# Patient Record
Sex: Female | Born: 1965
Health system: Southern US, Community
[De-identification: ages and names within clinical notes are randomized; demographics above are authoritative.]

## PROBLEM LIST (undated history)

## (undated) DIAGNOSIS — J45909 Unspecified asthma, uncomplicated: Secondary | ICD-10-CM

## (undated) DIAGNOSIS — I10 Essential (primary) hypertension: Secondary | ICD-10-CM

## (undated) DIAGNOSIS — Z90710 Acquired absence of both cervix and uterus: Secondary | ICD-10-CM

## (undated) DIAGNOSIS — Q2112 Patent foramen ovale: Secondary | ICD-10-CM

## (undated) DIAGNOSIS — Q211 Atrial septal defect: Secondary | ICD-10-CM

## (undated) DIAGNOSIS — R519 Headache, unspecified: Secondary | ICD-10-CM

## (undated) DIAGNOSIS — I639 Cerebral infarction, unspecified: Secondary | ICD-10-CM

## (undated) DIAGNOSIS — D649 Anemia, unspecified: Secondary | ICD-10-CM

## (undated) DIAGNOSIS — N200 Calculus of kidney: Secondary | ICD-10-CM

## (undated) DIAGNOSIS — R55 Syncope and collapse: Secondary | ICD-10-CM

## (undated) DIAGNOSIS — M719 Bursopathy, unspecified: Secondary | ICD-10-CM

## (undated) DIAGNOSIS — F329 Major depressive disorder, single episode, unspecified: Secondary | ICD-10-CM

## (undated) DIAGNOSIS — M75100 Unspecified rotator cuff tear or rupture of unspecified shoulder, not specified as traumatic: Secondary | ICD-10-CM

## (undated) DIAGNOSIS — K219 Gastro-esophageal reflux disease without esophagitis: Secondary | ICD-10-CM

## (undated) DIAGNOSIS — E119 Type 2 diabetes mellitus without complications: Secondary | ICD-10-CM

## (undated) DIAGNOSIS — F32A Depression, unspecified: Secondary | ICD-10-CM

## (undated) DIAGNOSIS — R51 Headache: Secondary | ICD-10-CM

## (undated) HISTORY — PX: BREAST REDUCTION SURGERY: SHX8

## (undated) HISTORY — PX: ABDOMINAL HYSTERECTOMY: SHX81

## (undated) HISTORY — PX: CARDIAC SURGERY: SHX584

## (undated) HISTORY — DX: Syncope and collapse: R55

## (undated) HISTORY — PX: HYSTEROTOMY: SHX1776

## (undated) HISTORY — PX: REDUCTION MAMMAPLASTY: SUR839

## (undated) HISTORY — PX: CARDIAC CATHETERIZATION: SHX172

---

## 1998-10-23 HISTORY — PX: REDUCTION MAMMAPLASTY: SUR839

## 2004-03-03 ENCOUNTER — Encounter: Admission: RE | Admit: 2004-03-03 | Discharge: 2004-03-03 | Payer: Self-pay | Admitting: Internal Medicine

## 2004-06-23 ENCOUNTER — Encounter: Admission: RE | Admit: 2004-06-23 | Discharge: 2004-06-23 | Payer: Self-pay | Admitting: Internal Medicine

## 2004-06-24 ENCOUNTER — Ambulatory Visit (HOSPITAL_COMMUNITY): Admission: RE | Admit: 2004-06-24 | Discharge: 2004-06-24 | Payer: Self-pay | Admitting: Gastroenterology

## 2004-06-30 ENCOUNTER — Encounter: Admission: RE | Admit: 2004-06-30 | Discharge: 2004-06-30 | Payer: Self-pay | Admitting: Internal Medicine

## 2004-07-04 ENCOUNTER — Ambulatory Visit (HOSPITAL_COMMUNITY): Admission: RE | Admit: 2004-07-04 | Discharge: 2004-07-04 | Payer: Self-pay | Admitting: Gastroenterology

## 2004-07-15 ENCOUNTER — Encounter: Admission: RE | Admit: 2004-07-15 | Discharge: 2004-07-15 | Payer: Self-pay | Admitting: Internal Medicine

## 2007-05-23 ENCOUNTER — Encounter: Admission: RE | Admit: 2007-05-23 | Discharge: 2007-05-23 | Payer: Self-pay | Admitting: Internal Medicine

## 2007-09-17 ENCOUNTER — Encounter: Admission: RE | Admit: 2007-09-17 | Discharge: 2007-09-17 | Payer: Self-pay | Admitting: Internal Medicine

## 2007-09-26 ENCOUNTER — Encounter: Admission: RE | Admit: 2007-09-26 | Discharge: 2007-09-26 | Payer: Self-pay | Admitting: Internal Medicine

## 2007-10-24 DIAGNOSIS — I639 Cerebral infarction, unspecified: Secondary | ICD-10-CM

## 2007-10-24 HISTORY — DX: Cerebral infarction, unspecified: I63.9

## 2008-02-24 ENCOUNTER — Encounter
Admission: RE | Admit: 2008-02-24 | Discharge: 2008-05-24 | Payer: Self-pay | Admitting: Physical Medicine & Rehabilitation

## 2008-02-26 ENCOUNTER — Ambulatory Visit: Payer: Self-pay | Admitting: Physical Medicine & Rehabilitation

## 2008-03-10 ENCOUNTER — Encounter
Admission: RE | Admit: 2008-03-10 | Discharge: 2008-03-10 | Payer: Self-pay | Admitting: Physical Medicine & Rehabilitation

## 2008-03-31 ENCOUNTER — Ambulatory Visit: Payer: Self-pay | Admitting: Physical Medicine & Rehabilitation

## 2008-05-05 ENCOUNTER — Ambulatory Visit: Payer: Self-pay | Admitting: Physical Medicine & Rehabilitation

## 2008-06-01 ENCOUNTER — Encounter
Admission: RE | Admit: 2008-06-01 | Discharge: 2008-06-02 | Payer: Self-pay | Admitting: Physical Medicine & Rehabilitation

## 2008-06-02 ENCOUNTER — Ambulatory Visit: Payer: Self-pay | Admitting: Physical Medicine & Rehabilitation

## 2008-07-03 ENCOUNTER — Ambulatory Visit (HOSPITAL_COMMUNITY): Admission: RE | Admit: 2008-07-03 | Discharge: 2008-07-03 | Payer: Self-pay | Admitting: Cardiology

## 2008-07-03 ENCOUNTER — Encounter (INDEPENDENT_AMBULATORY_CARE_PROVIDER_SITE_OTHER): Payer: Self-pay | Admitting: Cardiology

## 2008-08-13 ENCOUNTER — Encounter: Admission: RE | Admit: 2008-08-13 | Discharge: 2008-08-13 | Payer: Self-pay | Admitting: Cardiology

## 2008-08-18 ENCOUNTER — Ambulatory Visit (HOSPITAL_COMMUNITY): Admission: RE | Admit: 2008-08-18 | Discharge: 2008-08-19 | Payer: Self-pay | Admitting: Cardiology

## 2008-08-19 ENCOUNTER — Encounter (INDEPENDENT_AMBULATORY_CARE_PROVIDER_SITE_OTHER): Payer: Self-pay | Admitting: Cardiology

## 2009-02-25 ENCOUNTER — Encounter: Admission: RE | Admit: 2009-02-25 | Discharge: 2009-02-25 | Payer: Self-pay | Admitting: Cardiology

## 2009-03-02 ENCOUNTER — Ambulatory Visit (HOSPITAL_COMMUNITY): Admission: RE | Admit: 2009-03-02 | Discharge: 2009-03-04 | Payer: Self-pay | Admitting: Cardiology

## 2009-06-01 ENCOUNTER — Encounter: Admission: RE | Admit: 2009-06-01 | Discharge: 2009-06-01 | Payer: Self-pay | Admitting: Internal Medicine

## 2010-02-24 ENCOUNTER — Encounter: Admission: RE | Admit: 2010-02-24 | Discharge: 2010-02-24 | Payer: Self-pay | Admitting: Internal Medicine

## 2010-07-12 ENCOUNTER — Encounter: Admission: RE | Admit: 2010-07-12 | Discharge: 2010-07-12 | Payer: Self-pay | Admitting: Internal Medicine

## 2010-08-26 ENCOUNTER — Encounter: Admission: RE | Admit: 2010-08-26 | Discharge: 2010-08-26 | Payer: Self-pay | Admitting: Internal Medicine

## 2010-11-13 ENCOUNTER — Encounter: Payer: Self-pay | Admitting: Gastroenterology

## 2010-11-13 ENCOUNTER — Encounter: Payer: Self-pay | Admitting: Internal Medicine

## 2011-01-31 LAB — GLUCOSE, CAPILLARY
Glucose-Capillary: 121 mg/dL — ABNORMAL HIGH (ref 70–99)
Glucose-Capillary: 154 mg/dL — ABNORMAL HIGH (ref 70–99)
Glucose-Capillary: 181 mg/dL — ABNORMAL HIGH (ref 70–99)
Glucose-Capillary: 198 mg/dL — ABNORMAL HIGH (ref 70–99)
Glucose-Capillary: 200 mg/dL — ABNORMAL HIGH (ref 70–99)
Glucose-Capillary: 208 mg/dL — ABNORMAL HIGH (ref 70–99)
Glucose-Capillary: 220 mg/dL — ABNORMAL HIGH (ref 70–99)

## 2011-01-31 LAB — CBC
Hemoglobin: 13.1 g/dL (ref 12.0–15.0)
MCHC: 33.8 g/dL (ref 30.0–36.0)
Platelets: 183 10*3/uL (ref 150–400)
RDW: 13.3 % (ref 11.5–15.5)

## 2011-01-31 LAB — BASIC METABOLIC PANEL
BUN: 11 mg/dL (ref 6–23)
CO2: 28 mEq/L (ref 19–32)
Calcium: 8.7 mg/dL (ref 8.4–10.5)
GFR calc non Af Amer: >60 mL/min (ref >60)
Glucose, Bld: 241 mg/dL — ABNORMAL HIGH (ref 70–99)
Sodium: 136 mEq/L (ref 135–145)

## 2011-03-07 NOTE — Assessment & Plan Note (Signed)
Patricia [name] was admitted to [facility] on May 17, 2008,  for left lower extremity weakness.  MRI workup at the time was notable  for multiple small acute infarctions seen bilaterally, also is an area  of abnormal enhancement involving the falx just anterior to the frontal  horns.  There is also a hyperdense lesion on the right side of the  anterior portion of the corpus callosum.  Another large area was seen in  the right parietal cortex.  Small areas were seen involving small  subcortical white matter of the superior portion of the left parietal  cortex as well as the right frontal lobe.  Apparently,  hypercoagulability panel was performed and essentially was normal,  although homocysteine was pending at discharge.  Transthoracic echo was  unremarkable as was CT angio of the head and neck.  The patient is  scheduled to see Dr. Johnell Comings next week for followup.   The patient has continued to have headaches.  She has begun on the  [facility] but has not titrated up to the maximum targeted dose.  She has  used some hydrocodone and Vicodin in the past for breakthrough pain, but  it is not controlling them well at this point.  She does not feel the  Topamax is helping and does know that her memory is very poor and is  very frustrated by that.  She has been out of work for the last 2 weeks  now.   The patient reports some persistent left leg weakness, although it is  improved quite a bit.  She denies any numbness, problems with vision or  swallowing etc.  She rates her pain on average at 8/10, describes it as  constant, usually involving the posterior head/occipital region and  upper neck.  Pain interferes with general activity, relations with  others, and enjoyment of life on a moderate to severe level.   REVIEW OF SYSTEMS:  Notable for some tingling and weakness.  Other  pertinent positives are above, and full review is in the written health  and history section in the  chart.   SOCIAL HISTORY:  The patient is single, living with her son.   PHYSICAL EXAMINATION:  VITAL SIGNS:  Blood pressure is 175/107, pulse  71, and respiratory rate 18.  She is satting 97% on room air.  GENERAL:  The patient is alert but tearful at times and irritable.  She  states that she is just frustrated.  She remains morbidly overweight.  NEUROLOGICAL:  I found no focal cranial nerve exam findings today.  She  remains sensitive over the occiput and suboccipital regions.  Strength  is 5/5 in the upper extremities.  Sensation is normal in the upper  extremities.  Left lower extremity strength is perhaps 4 to 4+ out of 5,  and she has some problems balancing on that side still.  Sensation is  subjectively decreased.  HEART:  Regular.  CHEST:  Clear.   ASSESSMENT:  1. Chronic migraine headaches.  2. Multiple acute infarcts involving the bilateral frontoparietal      cortex.  Her most recent MRI that I had ordered in May revealed      right frontal and left temperoparietal white-matter lesions which      were small and appeared chronic in nature; the left one in fact was      evident on MRI from June 2005.  3. Insulin-requiring diabetes.  4. Uncontrolled hypertension.  5. Depression.  6. Morbid obesity.  PLAN:  1. Continue Savella titration.  We discussed this again today, and I      want her to shoot for 50 mg b.i.d.  2. We will decrease Topamax, as I am concerned that this is causing      memory deficits.  We will taper down to 100 mg nightly over the      next 2 weeks.  3. Oxycodone 30 mg q.8 h. for severe headaches.  4. We will begin low-dose fentanyl patch 12.5 mcg q.72 h.  5. Consider valproic acid.  6. The patient needs to follow up with Neurology regarding further      workup and source of headaches.  She might benefit from a      transesophageal echocardiogram to better visualize the valves and      heart walls.  The lesions now present on the MRI from July  are      certainly new and raise a big concern.  She is now on aspirin 81 mg      daily as prophylaxis.  7. Blood pressure control through Dr. Ludwig Clarks.  8. I will see her back in about a month.      Ranelle Oyster, M.D.  Electronically Signed     ZTS/MedQ  D:  06/02/2008 13:13:56  T:  06/03/2008 03:47:01  Job #:  [id number]   cc:   Ralene Ok, M.D.  Fax: [phone]   Jerolyn Shin  Fax: [phone]

## 2011-03-07 NOTE — Assessment & Plan Note (Signed)
Patricia [name] is back regarding her headaches.  The MRI of her brain revealed  no change in the right frontal and left temporoparietal lesions.  There  is a comment of chronic ischemia versus migraine headaches.  They did  not appear typical for multiple sclerosis. She felt that the Topamax was  helping initially, but then later on has not felt as much benefit.  Denies any new stressful events, allergies,  trauma, etc.  She uses the  hydrocodone during the day as she cannot tolerate the oxycodone while at  work.  Topamax causes decreased taste and dry mouth.  She rates her pain  as 6/10 today.  The pain is described as sharp, stabbing, and vise  gripping at times.  The pain is in the right frontotemporal as well as  occipital regions.  The pain interferes with general activity, relations  with others, and enjoyment of life on a moderate-to-severe level.   SOCIAL HISTORY:  The patient continues to work as a Engineer, site.  She has children.  She has missed a couple of days of work this month  due to headaches.   REVIEW OF SYSTEMS:  Notable for dizziness, loss of taste, poor appetite,  and dry mouth.  Other pertinent positives are above.   PHYSICAL EXAMINATION:  Blood pressure is 146/92, pulse 86, respiratory  rate 20, and O2 sat 96% on room air.  The patient is pleasant, alert, and oriented x3.  Affect is flat;  however, she became tearful at times, when talking about her headaches  and feelings of becoming desperate due to her persistent headache.  MOTOR EXAM:  Generally, 5/5.  HEART:  Regular.  CHEST:  Clear.  She remains morbidly obese.  Cognitively, she is generally appropriate.  No cranial nerve signs were  appreciated today.   ASSESSMENT:  1. Chronic migraine headaches.  2. Hypertension.  3. Insulin-requiring diabetes.  4. Depression.   PLAN:  1. We will initiate a trial of Pristiq 50 mg q.a.m. to see if this      helps at all with headaches and mood.  We certainly need to  treat      her mood one way or another as it is impacting her negatively,      regarding her pain outcomes.  2. We will push Topamax up to 300-400 mg at bedtime, side effects      permitting, to see if this gives her any further prophylaxis.  May      need to consider valproic acid addition.  3. Refilled hydrocodone 10/325 1 q.8 h. p.r.n.  4. Consider psychological referral.  5. I will see her back in about a month.      Ranelle Oyster, M.D.  Electronically Signed     ZTS/MedQ  D:  03/31/2008 13:10:15  T:  04/01/2008 21:53:56  Job #:  [id number]   cc:   Ralene Ok, M.D.  Fax: [phone]

## 2011-03-07 NOTE — Cardiovascular Report (Signed)
Patricia [name], Patricia [name]         ACCOUNT NO.:  [id number]   MEDICAL RECORD NO.:  [id number]          PATIENT TYPE:  INP   LOCATION:  3706                         FACILITY:  [facility]   PHYSICIAN:  Cristy Hilts. Jacinto Halim, MD       DATE OF BIRTH:  01-31-1966   DATE OF PROCEDURE:  DATE OF DISCHARGE:                            CARDIAC CATHETERIZATION   PROCEDURE PERFORMED:  1. Abdominal aortogram.  2. Selective right and left renal arteriography.   INDICATIONS:  Ms. [name] is a 45 year old morbidly obese  African American female with uncontrolled hypertension, diabetes,  hyperlipidemia, who has been having difficulty in controlling blood  pressure.  In fact in my office visit 2 weeks ago, her diastolic blood  pressure was 120 mmHg.  She is now brought to the peripheral angiography  suite to evaluate renal artery hypertension and renovascular  hypertension and renal artery stenosis.   Abdominal aortogram:  Abdominal aortogram revealed the presence of 2  renal arteries, one on either side.  Aortoiliac bifurcation was widely  patent.   Selective right and left renal arteriography:  Selective right and left  renal arteriography revealed widely patent renal arteries.  No evidence  of fibromuscular dysplasia or renal artery stenosis evident.   Ascending aorta and descending thoracic aortic pullback, there was no  pressure gradient essentially excluding coarctation of the aorta.   IMPRESSION:  Therapy for essential hypertension is indicated.  We are  going to admit the patient to the hospital for evaluation and response  of the medications that she is on to see whether she has been compliant  or noncompliant.  We will make further final recommendations in 24-48  hours.   Total of 30 mL of contrast was utilized for diagnostic angiography.   TECHNIQUE OF THE PROCEDURE:  Under usual sterile precautions using a 5-  French right femoral access, a 5-French JR-4 short diagnostic catheter  was utilized and advanced into the arch of the aorta and careful  pullback was performed into the left isthmus.  Then, the catheter was  gently pulled back into the abdominal aorta and the  abdominal aortogram, then selective right and left renal arteriography  was performed.  Catheter pulled out of the body.  The patient tolerated  the procedure well.  No immediate complications.  Total of 30 mL of  contrast was utilized for diagnostic angiography.       Cristy Hilts. Jacinto Halim, MD  Electronically Signed     JRG/MEDQ  D:  03/02/2009  T:  03/03/2009  Job:  [id number]   cc:   Ralene Ok, M.D.

## 2011-03-07 NOTE — Discharge Summary (Signed)
Patricia [name], Patricia [name]         ACCOUNT NO.:  [id number]   MEDICAL RECORD NO.:  [id number]          PATIENT TYPE:  INP   LOCATION:  3706                         FACILITY:  [facility]   PHYSICIAN:  Cristy Hilts. Jacinto Halim, MD       DATE OF BIRTH:  [birth year]   DATE OF ADMISSION:  03/02/2009  DATE OF DISCHARGE:  03/04/2009                               DISCHARGE SUMMARY   DISCHARGE DIAGNOSES:  1. Hypertension resistant.  2. Status post pulmonary vein angiogram with normal renal arteries and      normal aorta.  3. Uncontrolled-insulin diabetes with hemoglobin A1c 10.3.  4. Obesity.  5. Poor dietary habits.   LABORATORY DATA:  Hemoglobin A1c 10.3.  Sodium 136, potassium 4.1,  chloride 101, CO2 of 28, glucose 241, BUN 11, creatinine 0.88.  Hemoglobin 13.1, hematocrit 38.9, platelets 183, and WBCs 7.4.   Chest x-ray on Feb 25, 2009, showed no active cardiopulmonary disease.   DISCHARGE MEDICATIONS:  1. Tekturna 300 mg a day.  2. Diovan/hydrochlorothiazide 160/12.5 everyday.  3. Exforge 160/10 everyday.  4. Clonidine 0.1 mg twice per day.  5. Torsemide 20 mg twice per day.  6. Aspirin 325 mg once a day.  7. Crestor 10 mg a day.  8. Zonisamide 100 mg b.i.d.  9. Vitamin D 55 units every Wednesday.  10.Levemir 60 units everyday.  11.NovoLog 12-15 units t.i.d. p.r.n.  12.Phenergan 25 mg every 4 hours p.r.n.  13.Percocet 10/650 p.r.n.  14.Bystolic 20 mg a day.  15.K-Dur 20 mEq a day.   OTHER DISCHARGE INSTRUCTIONS:  No strenuous activity, lifting, pushing,  pulling, or exercise for 1 week.  She was told not to go back to work  until Mar 08, 2009.   HOSPITAL COURSE:  Patricia [name] is a 45 year old African American  female who was seen by Dr. Jacinto Halim and diagnosed with a resistant  hypertension.  Her blood pressures in the office had been diastolic up  to 120 on multiple high-dose medications.  It was decided that she  should undergo PV angiogram to rule out coarctation of the aorta or  renal  vascular hypertension.  This was performed electively on Mar 02, 2009.  She was found to have normal renal arteries and normal aorta.  Dr. Jacinto Halim wanted to keep her in the hospital 24-48 hours to watch her  response to her medications.  It was thought that her medications  initially were not changed; however, because of the recent changes she  had in her medications, they were not exactly what she had been on at  home.  The difference was that she was on an increased dose of diuretics  and on a lower dose of clonidine.  Thus at the time of discharge, it was  decided to put her on these dosage of medications instead of what she  had been on previously.  She did complain about symptoms with a higher  dose of clonidine.  Her blood pressures were 116/81, 104/67, 137/98, and  104/66 here in the hospital, it did go up to 159/91 at 6:00 a.m. on Mar 03, 2009.  We did orthostatics on  her, and she was not orthostatic  either her blood pressure went up when she stood up to 125/91 from a  lying position of 116/80.  Her heart rate was 59-67.  She was seen by  Dr. Lynnea Ferrier on Mar 04, 2009, considered stable to be discharged home.  She will follow up with Dr. Jacinto Halim in the office.  She will call for an  appointment because she has a tight schedule.      Patricia [name], N.P.      Cristy Hilts. Jacinto Halim, MD  Electronically Signed    BB/MEDQ  D:  03/04/2009  T:  03/05/2009  Job:  [id number]   cc:   Dr. Ludwig Clarks

## 2011-03-07 NOTE — Assessment & Plan Note (Signed)
HISTORY:  Patricia [name] is back regarding her headaches.  We added Pristiq  and Topamax was increased last visit.  She had some fatigue.  She does  not feel that her headaches are all that better.  She does have some  right-sided head pain today, which seems to radiate over into the right  eye.  She tells me she has had one occipital block and it has proven  helpful.  She thinks it was done by nurse practitioner at the [facility].  The patient states the pain is constant.  Pain is related to  general activity, relations with others, and enjoyment of life at a  moderate level.   SOCIAL HISTORY:  Notable for the patient being single and living with  her son.  She also is supportive of her daughter who has 3 children of  her own now and this apparently causes a lot of stress in her life.   REVIEW OF SYSTEMS:  Notable for the above.  The patient does report some  weight loss and decreased appetite with the Topamax.  Full review is in  the written health and history section of the chart.   PHYSICAL EXAMINATION:  VITAL SIGNS:  Blood pressure is 161/101, pulse is  69, respiratory rate 18, and she is sating 99% on room air.  GENERAL:  The patient is a little more animated today and less tearful.  NEUROLOGIC:  Cranial nerve exam is generally intact.  She has some  sensitivity over the suboccipital and occipital areas of the head/neck.  Strength is 5/5.  Normal sensation and reflexes.  Cranial nerve exam is  unremarkable.  HEART:  Regular.  CHEST:  Clear.  The patient remains overweight.   ASSESSMENT:  1. Chronic migraine headaches.  2. Hypertension.  3. Insulin-requiring diabetes.  4. Depression.   PLAN:  1. We will transition over from Pristiq to [facility] and see if this is      more beneficial for energy, headaches, and mood.  2. Continue Topamax at the current dose.  3. She will discontinue hydrocodone and try Tylenol 10/650 mg one q.8      h. p.r.n., #90.  4. Consider long-acting  opiate and/or valproic acid trial.  5. The patient may need psychological assistance, as I think this is a      big component of her pain.  6. I will see her back in a month.      Ranelle Oyster, M.D.  Electronically Signed     ZTS/MedQ  D:  05/05/2008 13:18:31  T:  05/06/2008 06:16:46  Job #:  [id number]

## 2011-03-07 NOTE — Cardiovascular Report (Signed)
NAMEGRAVIELA, [name]         ACCOUNT NO.:  [id number]   MEDICAL RECORD NO.:  [id number]          PATIENT TYPE:  OIB   LOCATION:  6524                         FACILITY:  [facility]   PHYSICIAN:  Cristy Hilts. Jacinto Halim, MD       DATE OF BIRTH:  Jun 19, 1966   DATE OF PROCEDURE:  08/18/2008  DATE OF DISCHARGE:                            CARDIAC CATHETERIZATION   PROCEDURE PERFORMED:  Intracardiac echo-guided closure of the  fenestrated atrial septal defect.   INDICATIONS:  Patricia [name] is a 45 year old female who has  history of hypertension and morbid obesity.  She has had a cardioembolic  stroke on May 16, 2008.  She had undergone a transesophageal  echocardiogram and this had revealed the significant shunting of blood  from the left to the right and also positive right-to-left shunting.  The intraatrial septum was fenestrated.  Given her cardioembolic stroke  and presence of a secundum atrial septal defect, we felt that proceeding  with closure of the ASD was indicated.  After obtaining informed  consent, she is brought to the Catheterization Lab for an intracardiac  echo-guided closure of the atrial septal defect.   INTRACARDIAC ECHO DATA:  The left ventricle was normal.  The right  ventricle appeared to be minimally dilated.  The systolic function was  normal.  Both the atria appeared to be minimally dilated.   The intraatrial septum showed fenestrated atrial septal defect.  The  largest defect measured about 0.8 mm with balloon sizing. There was  markedly positive right to left shunting with double contrast injection.   The aortic valve was normal.  The mitral valve was normal with mild  mitral regurgitation.  Tricuspid valve was normal with mild tricuspid  regurgitation and pulmonary valve was normal.   INTERVENTION DATA:  Successful intracardiac echo and fluoroscopy-guided  closure of the atrial septal defect with implantation of a 9-mm atrial  septal defect occluder.   Post-deployment, very minimal residual shunting  was seen through the device which is within normal limits.  Double  contrast injection done preprocedure was strongly positive.  Postprocedure was mildly positive for right-to-left residual shunting.   A stop-flow technique was utilized for sizing of the atrial septal  defect closure.   RECOMMENDATIONS:  The patient will be discharged home in the morning.  She will need a limited 2-D echo Doppler in the morning for evaluation  of pericardial effusion and device position.  She will need infective  endocarditis prophylaxis for a period of 6 months, aspirin indefinitely  and Plavix for a period of 3 months at least.   TECHNIQUE OF PROCEDURE:  Usual sterile precautions using an 8-French  right femoral venous and a 10-French right femoral venous access,  intracardiac echo probe was advanced through the venous sheath into the  right atrium and the intracardiac echo was performed at the data was  carefully analyzed.  Using a 6-French multipurpose A2 catheter and a  Amplatz stiff wire, I was able to easily cross the atrial septal defect  and the wire was carefully positioned into the left upper pulmonary  vein.   Using heparin for anticoagulation maintaining  greater than 200, an 8-  [place] delivery catheter was advanced over the Amplatzer wire and  positioned into the left upper pulmonary vein.  Then, a 9-mm ASD septal  occluder was utilized and loaded in the usual fashion and the left  atrial disk was carefully deployed.  After having performed this, the  right atrial side of the disk was carefully deployed.  A [place]  wiggle was performed fairly aggressively and the presence of interatrial  septum between the disks was confirmed in multiple views.  Double  contrast injection was also performed and color Doppler evaluation was  also performed.  After being satisfied with the device position, the  occluder was released and echocardiogram  was repeated.  Fluoroscopic  guidance was also utilized.  The delivery sheath was then withdrawn out  of body and exchanged to a short 8-French sheath.  The sheaths were  sutured in place after the intracardiac echo probe was withdrawn out of  body.  The patient tolerated the procedure.  No immediate complications  were noted.      Cristy Hilts. Jacinto Halim, MD  Electronically Signed     JRG/MEDQ  D:  08/18/2008  T:  08/19/2008  Job:  [id number]   cc:   Ralene Ok, M.D.

## 2011-03-10 NOTE — Op Note (Signed)
Patricia [name], Patricia [name]                     ACCOUNT NO.:  [id number]   MEDICAL RECORD NO.:  [id number]                   PATIENT TYPE:  AMB   LOCATION:  ENDO                                 FACILITY:  [facility]   PHYSICIAN:  Jordan Hawks. Elnoria Howard, MD                 DATE OF BIRTH:  03-Feb-1966   DATE OF PROCEDURE:  07/04/2004  DATE OF DISCHARGE:                                 OPERATIVE REPORT   PROCEDURE PERFORMED:  Esophagogastroduodenoscopy.   ENDOSCOPIST:  Jordan Hawks. Elnoria Howard, MD   INSTRUMENT USED:  Olympus adult endoscope.   INDICATIONS FOR PROCEDURE:  Abdominal pain.   PHYSICAL EXAMINATION:  LUNGS:  Clear to auscultation bilaterally.  CARDIOVASCULAR:  Regular rate and rhythm.  ABDOMEN:  Obese, tender to palpation in the midquadrant.   MEDICATIONS:  Versed 4 mg IV.  Demerol 30 mg IV.   CONSENT:  Informed consent was obtained from the patient, describing the  risks of bleeding, infection, perforation, risk of medication reactions and  death, all of which are not exclusive of any other complications that can  occur.   DESCRIPTION OF PROCEDURE:  After the patient was placed in the left lateral  decubitus position and under adequate sedation, the endoscope was advanced  from the oral cavity into the proximal duodenum without difficulty under  direct visualization.  There was no evidence of any ulceration,  inflammation, erosions or masses in the duodenum, stomach or the esophagus.  The Z-line was normal.  Cold biopsies were taken in the antrum and the  fundal areas.  The patient tolerated the procedure well.  No complications  were encountered.   PLAN:  1.  The patient is to continue medications at this time.  2.  Will contact the patient over the phone in regard to the findings and      discuss further management decisions.                                               Jordan Hawks Elnoria Howard, MD    PDH/MEDQ  D:  07/04/2004  T:  07/04/2004  Job:  [id number]

## 2011-07-24 LAB — CBC
HCT: 39.4
Hemoglobin: 13.1
Platelets: 183
WBC: 7.7

## 2011-07-24 LAB — GLUCOSE, CAPILLARY
Glucose-Capillary: 136 — ABNORMAL HIGH
Glucose-Capillary: 149 — ABNORMAL HIGH
Glucose-Capillary: 60 — ABNORMAL LOW
Glucose-Capillary: 72
Glucose-Capillary: 96

## 2011-07-24 LAB — BASIC METABOLIC PANEL
BUN: 7
Calcium: 9
GFR calc non Af Amer: >60
Potassium: 3.9
Sodium: 142

## 2011-07-26 LAB — GLUCOSE, CAPILLARY: Glucose-Capillary: 240 — ABNORMAL HIGH

## 2012-09-09 ENCOUNTER — Other Ambulatory Visit: Payer: Self-pay | Admitting: Internal Medicine

## 2014-05-20 ENCOUNTER — Ambulatory Visit
Admission: RE | Admit: 2014-05-20 | Discharge: 2014-05-20 | Disposition: A | Discharge status: Home / Self Care | Payer: BC Managed Care – PPO | Source: Ambulatory Visit | Attending: Internal Medicine | Admitting: Internal Medicine

## 2014-05-20 ENCOUNTER — Other Ambulatory Visit: Payer: Self-pay | Admitting: Internal Medicine

## 2014-05-20 DIAGNOSIS — R531 Weakness: Secondary | ICD-10-CM

## 2014-05-22 ENCOUNTER — Encounter (HOSPITAL_COMMUNITY): Payer: Self-pay | Admitting: Emergency Medicine

## 2014-05-22 ENCOUNTER — Inpatient Hospital Stay (HOSPITAL_COMMUNITY)
Admission: EM | Admit: 2014-05-22 | Discharge: 2014-05-24 | DRG: 065 | Disposition: A | Discharge status: Home / Self Care | Payer: BC Managed Care – PPO | Attending: Internal Medicine | Admitting: Internal Medicine

## 2014-05-22 ENCOUNTER — Emergency Department (HOSPITAL_COMMUNITY): Payer: BC Managed Care – PPO

## 2014-05-22 ENCOUNTER — Inpatient Hospital Stay (HOSPITAL_COMMUNITY): Payer: BC Managed Care – PPO

## 2014-05-22 DIAGNOSIS — Z9119 Patient's noncompliance with other medical treatment and regimen: Secondary | ICD-10-CM

## 2014-05-22 DIAGNOSIS — E119 Type 2 diabetes mellitus without complications: Secondary | ICD-10-CM | POA: Diagnosis present

## 2014-05-22 DIAGNOSIS — E118 Type 2 diabetes mellitus with unspecified complications: Secondary | ICD-10-CM

## 2014-05-22 DIAGNOSIS — Z8774 Personal history of (corrected) congenital malformations of heart and circulatory system: Secondary | ICD-10-CM

## 2014-05-22 DIAGNOSIS — I639 Cerebral infarction, unspecified: Secondary | ICD-10-CM | POA: Diagnosis present

## 2014-05-22 DIAGNOSIS — Z91199 Patient's noncompliance with other medical treatment and regimen due to unspecified reason: Secondary | ICD-10-CM

## 2014-05-22 DIAGNOSIS — I10 Essential (primary) hypertension: Secondary | ICD-10-CM | POA: Diagnosis present

## 2014-05-22 DIAGNOSIS — Z888 Allergy status to other drugs, medicaments and biological substances status: Secondary | ICD-10-CM

## 2014-05-22 DIAGNOSIS — I6350 Cerebral infarction due to unspecified occlusion or stenosis of unspecified cerebral artery: Principal | ICD-10-CM

## 2014-05-22 DIAGNOSIS — R4701 Aphasia: Secondary | ICD-10-CM | POA: Diagnosis present

## 2014-05-22 DIAGNOSIS — Z8673 Personal history of transient ischemic attack (TIA), and cerebral infarction without residual deficits: Secondary | ICD-10-CM

## 2014-05-22 DIAGNOSIS — Z8249 Family history of ischemic heart disease and other diseases of the circulatory system: Secondary | ICD-10-CM

## 2014-05-22 DIAGNOSIS — I69959 Hemiplegia and hemiparesis following unspecified cerebrovascular disease affecting unspecified side: Secondary | ICD-10-CM

## 2014-05-22 HISTORY — DX: Essential (primary) hypertension: I10

## 2014-05-22 HISTORY — DX: Cerebral infarction, unspecified: I63.9

## 2014-05-22 HISTORY — DX: Type 2 diabetes mellitus without complications: E11.9

## 2014-05-22 LAB — COMPREHENSIVE METABOLIC PANEL
ALK PHOS: 118 U/L — AB (ref 39–117)
ALT: 21 U/L (ref 0–35)
AST: 22 U/L (ref 0–37)
Albumin: 3.6 g/dL (ref 3.5–5.2)
Anion gap: 11 (ref 5–15)
BUN: 16 mg/dL (ref 6–23)
CHLORIDE: 105 meq/L (ref 96–112)
CO2: 26 meq/L (ref 19–32)
Calcium: 9.3 mg/dL (ref 8.4–10.5)
Creatinine, Ser: 0.92 mg/dL (ref 0.50–1.10)
GFR calc Af Amer: 85 mL/min — ABNORMAL LOW (ref >90)
GFR, EST NON AFRICAN AMERICAN: 73 mL/min — AB (ref >90)
GLUCOSE: 198 mg/dL — AB (ref 70–99)
POTASSIUM: 4.8 meq/L (ref 3.7–5.3)
SODIUM: 142 meq/L (ref 137–147)
Total Bilirubin: 0.3 mg/dL (ref 0.3–1.2)
Total Protein: 7.6 g/dL (ref 6.0–8.3)

## 2014-05-22 LAB — DIFFERENTIAL
Basophils Absolute: 0 10*3/uL (ref 0.0–0.1)
Basophils Relative: 0 % (ref 0–1)
Eosinophils Absolute: 0.1 10*3/uL (ref 0.0–0.7)
Eosinophils Relative: 1 % (ref 0–5)
LYMPHS ABS: 1.8 10*3/uL (ref 0.7–4.0)
LYMPHS PCT: 32 % (ref 12–46)
Monocytes Absolute: 0.3 10*3/uL (ref 0.1–1.0)
Monocytes Relative: 5 % (ref 3–12)
NEUTROS PCT: 62 % (ref 43–77)
Neutro Abs: 3.4 10*3/uL (ref 1.7–7.7)

## 2014-05-22 LAB — URINE MICROSCOPIC-ADD ON

## 2014-05-22 LAB — APTT: aPTT: 29 seconds (ref 24–37)

## 2014-05-22 LAB — RAPID URINE DRUG SCREEN, HOSP PERFORMED
Amphetamines: NOT DETECTED
Barbiturates: NOT DETECTED
Benzodiazepines: NOT DETECTED
Cocaine: NOT DETECTED
OPIATES: NOT DETECTED
TETRAHYDROCANNABINOL: NOT DETECTED

## 2014-05-22 LAB — URINALYSIS, ROUTINE W REFLEX MICROSCOPIC
Bilirubin Urine: NEGATIVE
Glucose, UA: >1000 mg/dL — AB
HGB URINE DIPSTICK: NEGATIVE
Ketones, ur: NEGATIVE mg/dL
Leukocytes, UA: NEGATIVE
Nitrite: NEGATIVE
PROTEIN: NEGATIVE mg/dL
Specific Gravity, Urine: 1.026 (ref 1.005–1.030)
UROBILINOGEN UA: 1 mg/dL (ref 0.0–1.0)
pH: 6 (ref 5.0–8.0)

## 2014-05-22 LAB — GLUCOSE, CAPILLARY
Glucose-Capillary: 190 mg/dL — ABNORMAL HIGH (ref 70–99)
Glucose-Capillary: 107 mg/dL — ABNORMAL HIGH (ref 70–99)

## 2014-05-22 LAB — CBC
HCT: 42 % (ref 36.0–46.0)
Hemoglobin: 13.7 g/dL (ref 12.0–15.0)
MCH: 28 pg (ref 26.0–34.0)
MCHC: 32.6 g/dL (ref 30.0–36.0)
MCV: 85.9 fL (ref 78.0–100.0)
Platelets: 216 10*3/uL (ref 150–400)
RBC: 4.89 MIL/uL (ref 3.87–5.11)
RDW: 13.2 % (ref 11.5–15.5)
WBC: 5.5 10*3/uL (ref 4.0–10.5)

## 2014-05-22 LAB — ETHANOL

## 2014-05-22 LAB — PROTIME-INR
INR: 1 (ref 0.00–1.49)
Prothrombin Time: 13.2 seconds (ref 11.6–15.2)

## 2014-05-22 LAB — I-STAT TROPONIN, ED: Troponin i, poc: 0 ng/mL (ref 0.00–0.08)

## 2014-05-22 LAB — ANTITHROMBIN III: AntiThromb III Func: 95 % (ref 75–120)

## 2014-05-22 MED ORDER — ACETAMINOPHEN 650 MG RE SUPP: 650.0000 mg | RECTAL | Status: DC | PRN

## 2014-05-22 MED ORDER — STROKE: EARLY STAGES OF RECOVERY BOOK
Freq: Once | Status: AC
  Administered 2014-05-22: 18:00:00
  Filled 2014-05-22: qty 1

## 2014-05-22 MED ORDER — HEPARIN SODIUM (PORCINE) 5000 UNIT/ML IJ SOLN
5000.0000 [IU] | Freq: Three times a day (TID) | INTRAMUSCULAR | Status: DC
  Administered 2014-05-23 – 2014-05-24 (×4): 5000 [IU] via SUBCUTANEOUS
  Filled 2014-05-22 (×3): qty 1

## 2014-05-22 MED ORDER — LORAZEPAM 2 MG/ML IJ SOLN
1.0000 mg | Freq: Once | INTRAMUSCULAR | Status: AC
  Administered 2014-05-22: 1 mg via INTRAVENOUS
  Filled 2014-05-22: qty 1

## 2014-05-22 MED ORDER — ACETAMINOPHEN 325 MG PO TABS
650.0000 mg | ORAL_TABLET | ORAL | Status: DC | PRN
  Administered 2014-05-23: 650 mg via ORAL
  Filled 2014-05-22: qty 2

## 2014-05-22 MED ORDER — SODIUM CHLORIDE 0.9 % IV SOLN
INTRAVENOUS | Status: AC
  Administered 2014-05-22: 1000 mL via INTRAVENOUS

## 2014-05-22 MED ORDER — INSULIN ASPART 100 UNIT/ML ~~LOC~~ SOLN
0.0000 [IU] | Freq: Three times a day (TID) | SUBCUTANEOUS | Status: DC
Start: 2014-05-22 — End: 2014-05-24
  Administered 2014-05-22: 3 [IU] via SUBCUTANEOUS
  Administered 2014-05-23 (×2): 2 [IU] via SUBCUTANEOUS
  Administered 2014-05-24: 3 [IU] via SUBCUTANEOUS
  Administered 2014-05-24: 2 [IU] via SUBCUTANEOUS

## 2014-05-22 MED ORDER — OXYCODONE-ACETAMINOPHEN 5-325 MG PO TABS
1.0000 | ORAL_TABLET | Freq: Four times a day (QID) | ORAL | Status: DC | PRN
  Administered 2014-05-22 – 2014-05-23 (×3): 1 via ORAL
  Filled 2014-05-22 (×3): qty 1

## 2014-05-22 MED ORDER — ASPIRIN 325 MG PO TABS
325.0000 mg | ORAL_TABLET | Freq: Every day | ORAL | Status: DC
  Administered 2014-05-23: 325 mg via ORAL
  Filled 2014-05-22: qty 1

## 2014-05-22 MED ORDER — ASPIRIN 300 MG RE SUPP: 300.0000 mg | Freq: Every day | RECTAL | Status: DC

## 2014-05-22 MED ORDER — HYDRALAZINE HCL 20 MG/ML IJ SOLN
10.0000 mg | Freq: Four times a day (QID) | INTRAMUSCULAR | Status: DC | PRN
  Administered 2014-05-23: 10 mg via INTRAVENOUS
  Filled 2014-05-22: qty 1

## 2014-05-22 MED ORDER — ASPIRIN 81 MG PO CHEW
324.0000 mg | CHEWABLE_TABLET | Freq: Once | ORAL | Status: AC
  Administered 2014-05-22: 324 mg via ORAL
  Filled 2014-05-22: qty 4

## 2014-05-22 NOTE — ED Provider Notes (Signed)
Date: 05/22/2014  Rate: 62  Rhythm: normal sinus rhythm  QRS Axis: normal  Intervals: normal  ST/T Wave abnormalities: borderline T abnormalities, diffuse leads  Conduction Disutrbances: none  Narrative Interpretation: No prior for comparison  Medical screening examination/treatment/procedure(s) were conducted as a shared visit with non-physician practitioner(s) and myself.  I personally evaluated the patient during the encounter. Pt presents w/ R sided weakness for 3 days and hx of prior CVA. PE findings c/w R sided CVA. MRI orderd which showed CVA. Pt admitted to medical service.   1. Acute CVA (cerebrovascular accident)   2. Type 2 diabetes mellitus with complication   3. HTN (hypertension), benign            Shanna CiscoMegan E Vyolet Sakuma, MD 05/22/14 [phone]

## 2014-05-22 NOTE — ED Notes (Signed)
phlebotomy at bedside.  

## 2014-05-22 NOTE — H&P (Signed)
[facility] History and Physical  Patricia [name] VFI:[id number] DOB: May 20, 1966 DOA: 05/22/2014   PCP: Patricia Panda, MD  Specialists: She used to be followed by neurologist and cardiologist, but not any longer  Chief Complaint: Right-sided weakness  HPI: Patricia [name] is a 48 y.o. female with a past medical history of obesity, hypertension, diabetes, previous history of stroke in July of 2009, who was in her usual state of health till about 3 days ago, when she started noticing tingling and numbness in the right arm. She also felt "funny" in her right leg. This happened while she was driving. 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 He ordered a CT Head on 7/29. CT was nonspecific. Patient however, continued to have the symptoms and then today she had a lot of difficulty moving her right arm. She was subsequently brought into the emergency department. She has also noted difficulty with speaking. Denies any difficulty with swallowing. There is no history of fever, chills. No visual disturbances. No seizure activity. No urinary or stool incontinence. She admits to being noncompliant with her medications. She is supposed to be on aspirin every day, but does not taken it consistently.  Home Medications: Prior to Admission medications   Medication Sig Start Date End Date Taking? Authorizing Provider  aspirin EC 81 MG tablet Take 81 mg by mouth daily.   Yes Historical Provider, MD  Azilsartan-Chlorthalidone (EDARBYCLOR) 40-25 MG TABS Take 1 tablet by mouth daily.   Yes Historical Provider, MD  fluconazole (DIFLUCAN) 150 MG tablet Take 150 mg by mouth daily as needed (for yeast infections).   Yes Historical Provider, MD  nebivolol (BYSTOLIC) 10 MG tablet Take 30 mg by mouth daily.   Yes Historical Provider, MD  oxyCODONE-acetaminophen (PERCOCET) 10-325 MG per tablet Take 1 tablet by mouth every 6 (six) hours as needed for pain.   Yes Historical  Provider, MD  promethazine (PHENERGAN) 25 MG tablet Take 25 mg by mouth every 6 (six) hours as needed for nausea or vomiting.   Yes Historical Provider, MD  tiZANidine (ZANAFLEX) 4 MG tablet Take 4 mg by mouth every 6 (six) hours as needed for muscle spasms.   Yes Historical Provider, MD    Allergies:  Allergies  Allergen Reactions  . Imitrex [Sumatriptan] Anaphylaxis    Past Medical History: Past Medical History  Diagnosis Date  . Hypertension   . Diabetes mellitus without complication   . Stroke 2009    Past Surgical History  Procedure Laterality Date  . Cardiac surgery  Closed hole in heart in 2009    Social History: She lives in [place]. Denies smoking, alcohol, or illicit drug use. Usually independent with daily activities  Family History:  Family History  Problem Relation Age of Onset  . Hypertension Mother   . Hypertension Father      Review of Systems - History obtained from the patient General ROS: negative Psychological ROS: positive for - anxiety Ophthalmic ROS: negative ENT ROS: negative Allergy and Immunology ROS: negative Hematological and Lymphatic ROS: negative Endocrine ROS: negative Respiratory ROS: no cough, shortness of breath, or wheezing Cardiovascular ROS: no chest pain or dyspnea on exertion Gastrointestinal ROS: no abdominal pain, change in bowel habits, or black or bloody stools Genito-Urinary ROS: no dysuria, trouble voiding, or hematuria Musculoskeletal ROS: negative Neurological ROS: as in hpi Dermatological ROS: negative  Physical Examination  Filed Vitals:   05/22/14 1333 05/22/14 1345 05/22/14 1430 05/22/14 1439  BP: 180/104  170/93    Pulse: 62 58  65  Temp:   98.3 F (36.8 C)   TempSrc:      Resp: 18 12  15   Height:      Weight:      SpO2: 97% 100%  100%    BP 170/93  Pulse 65  Temp(Src) 98.3 F (36.8 C) (Oral)  Resp 15  Ht 5' 6"  (1.676 m)  Wt 106.595 kg (235 lb)  BMI 37.95 kg/m2  SpO2 100%  General  appearance: alert, cooperative, appears stated age, no distress and morbidly obese Head: Normocephalic, without obvious abnormality, atraumatic Eyes: conjunctivae/corneas clear. PERRL, EOM's intact.  Throat: lips, mucosa, and tongue normal; teeth and gums normal Neck: no adenopathy, no carotid bruit, no JVD, supple, symmetrical, trachea midline and thyroid not enlarged, symmetric, no tenderness/mass/nodules Resp: clear to auscultation bilaterally Cardio: regular rate and rhythm, S1, S2 normal, no murmur, click, rub or gallop GI: soft, non-tender; bowel sounds normal; no masses,  no organomegaly Extremities: extremities normal, atraumatic, no cyanosis or edema Pulses: 2+ and symmetric Skin: Skin color, texture, turgor normal. No rashes or lesions Neurologic: Some degree of dysarthria and expressive aphasia is noted. No facial droop. Tongue is midline. Strength in the right upper extremity is 0-1/5. Strength in the right lower extremity is 3-4/5. Strength is normal in the left upper and lower extremity. She does have poor coordination on the right as well. Gait was not assessed.  Laboratory Data: Results for orders placed during the hospital encounter of 05/22/14 (from the past 48 hour(s))  PROTIME-INR     Status: None   Collection Time    05/22/14 10:45 AM      Result Value Ref Range   Prothrombin Time 13.2  11.6 - 15.2 seconds   INR 1.00  0.00 - 1.49  APTT     Status: None   Collection Time    05/22/14 10:45 AM      Result Value Ref Range   aPTT 29  24 - 37 seconds  CBC     Status: None   Collection Time    05/22/14 10:45 AM      Result Value Ref Range   WBC 5.5  4.0 - 10.5 K/uL   RBC 4.89  3.87 - 5.11 MIL/uL   Hemoglobin 13.7  12.0 - 15.0 g/dL   HCT 42.0  36.0 - 46.0 %   MCV 85.9  78.0 - 100.0 fL   MCH 28.0  26.0 - 34.0 pg   MCHC 32.6  30.0 - 36.0 g/dL   RDW 13.2  11.5 - 15.5 %   Platelets 216  150 - 400 K/uL  DIFFERENTIAL     Status: None   Collection Time    05/22/14 10:45  AM      Result Value Ref Range   Neutrophils Relative % 62  43 - 77 %   Neutro Abs 3.4  1.7 - 7.7 K/uL   Lymphocytes Relative 32  12 - 46 %   Lymphs Abs 1.8  0.7 - 4.0 K/uL   Monocytes Relative 5  3 - 12 %   Monocytes Absolute 0.3  0.1 - 1.0 K/uL   Eosinophils Relative 1  0 - 5 %   Eosinophils Absolute 0.1  0.0 - 0.7 K/uL   Basophils Relative 0  0 - 1 %   Basophils Absolute 0.0  0.0 - 0.1 K/uL  COMPREHENSIVE METABOLIC PANEL     Status: Abnormal   Collection Time  05/22/14 10:45 AM      Result Value Ref Range   Sodium 142  137 - 147 mEq/L   Potassium 4.8  3.7 - 5.3 mEq/L   Chloride 105  96 - 112 mEq/L   CO2 26  19 - 32 mEq/L   Glucose, Bld 198 (*) 70 - 99 mg/dL   BUN 16  6 - 23 mg/dL   Creatinine, Ser 0.92  0.50 - 1.10 mg/dL   Calcium 9.3  8.4 - 10.5 mg/dL   Total Protein 7.6  6.0 - 8.3 g/dL   Albumin 3.6  3.5 - 5.2 g/dL   AST 22  0 - 37 U/L   ALT 21  0 - 35 U/L   Alkaline Phosphatase 118 (*) 39 - 117 U/L   Total Bilirubin 0.3  0.3 - 1.2 mg/dL   GFR calc non Af Amer 73 (*) >90 mL/min   GFR calc Af Amer 85 (*) >90 mL/min   Comment: (NOTE)     The eGFR has been calculated using the CKD EPI equation.     This calculation has not been validated in all clinical situations.     eGFR's persistently <90 mL/min signify possible Chronic Kidney     Disease.   Anion gap 11  5 - 15  ETHANOL     Status: None   Collection Time    05/22/14 10:45 AM      Result Value Ref Range   Alcohol, Ethyl (B) <11  0 - 11 mg/dL   Comment:            LOWEST DETECTABLE LIMIT FOR     SERUM ALCOHOL IS 11 mg/dL     FOR MEDICAL PURPOSES ONLY  I-STAT TROPOININ, ED     Status: None   Collection Time    05/22/14 10:52 AM      Result Value Ref Range   Troponin i, poc 0.00  0.00 - 0.08 ng/mL   Comment 3            Comment: Due to the release kinetics of cTnI,     a negative result within the first hours     of the onset of symptoms does not rule out     myocardial infarction with certainty.     If  myocardial infarction is still suspected,     repeat the test at appropriate intervals.    Radiology Reports: Ct Head Wo Contrast  05/20/2014   CLINICAL DATA:  Right-sided weakness for 2 days. History of cerebral ischemia in 2009 with residual weakness. History of migraine headaches.  EXAM: CT HEAD WITHOUT CONTRAST  TECHNIQUE: Contiguous axial images were obtained from the base of the skull through the vertex without intravenous contrast.  COMPARISON:  MRI brain 03/10/2008.  Head CT 05/15/2010.  FINDINGS: There is no evidence of acute intracranial hemorrhage, mass lesion, brain edema or extra-axial fluid collection. The ventricles and subarachnoid spaces are appropriately sized for age. There is no CT evidence of acute cortical infarction. There are stable chronic ischemic changes in the right frontal periventricular white matter. There is questionable new ill-defined low-density anteriorly in the right thalamus on image 12.  The visualized paranasal sinuses, mastoid air cells and middle ears are clear. The calvarium is intact.  IMPRESSION: No definite acute intracranial findings or explanation for right-sided weakness. There is questionable new ill-defined low-density anteriorly in the right thalamus on a single image.   Electronically Signed   By: Modesta Messing.D.  On: 05/20/2014 18:33   Mr Jodene Nam Head Wo Contrast  05/22/2014   CLINICAL DATA:  Right-sided weakness and numbness  EXAM: MRI HEAD WITHOUT CONTRAST  MRA HEAD WITHOUT CONTRAST  TECHNIQUE: Multiplanar, multiecho pulse sequences of the brain and surrounding structures were obtained without intravenous contrast. Angiographic images of the head were obtained using MRA technique without contrast.  COMPARISON:  Head CT 05/20/2014. MRI 12/21/2009. CT angiography 05/19/2008  FINDINGS: MRI HEAD FINDINGS  No abnormality is seen affecting the brainstem or cerebellum. There are old small vessel type ischemic changes present affecting the thalami an the  cerebral hemispheric deep white matter bilaterally, with old infarction at the genu of the corpus callosum. There is an old small cortical infarction at the right parietal vertex. There are numerous acute infarctions within the left hemisphere including involvement of the head of the caudate and the cortical and subcortical brain in the frontal, posterior temporal and parietal regions. The findings could be due to a combination of watershed infarction and micro embolic infarctions. No large confluent infarction. No hemorrhage. No swelling or shift.  No evidence of neoplastic mass lesion. No hydrocephalus or extra-axial collection. No pituitary mass. No inflammatory sinus disease.  MRA HEAD FINDINGS  Both internal carotid arteries are widely patent through the skullbase. Both carotid siphons are widely patent. The right internal carotid artery supplies the right middle cerebral artery. There are multiple stenosis E is an 8 diminished number of branches demonstrated. There is very minimal flow and to the anterior cerebral arteries. The left supra clinoid internal carotid artery is patent. There is a severe stenosis of the left M1 segment, 90% or greater. Flow is demonstrated distal to that, but there are a diminished number of distal MCA branches. There is very poor flow a in the anterior cerebral system. The left PCA takes a fetal origin from the anterior circulation. There is a large posterior communicating artery on the right. Distal branch vessels of the PCA territories appear irregular. Both vertebral arteries are patent with the right being dominant. Left vertebral artery supplies PICA and continues on as a very small vessel, patent to the basilar. The basilar shows mild atherosclerotic irregularity but no focal stenosis. There is mild bulb was deformity of the distal basilar tip but I would not categorize this as a frank aneurysm. Anterior and middle cerebral stress that superior cerebellar and posterior  cerebral arteries show flow bilaterally. As noted previously, there is left fetal origin an a large right posterior communicating artery. Distal PCA branch vessels show irregularity.  IMPRESSION: Multiple acute infarctions in the left hemisphere that could be a combination of watershed infarctions and micro embolic infarctions. MR angiography shows advanced intracranial disease with very limited flow and the anterior cerebral artery territories and multiple middle cerebral stenoses, particularly severe and notable at the M1 segment on the left with the vessel shows 90% stenosis. Distal branch vessels appear reduced in number an show irregularity. The findings could be due to widespread advanced atherosclerotic disease or vasculitis. This represents a marked worsening of disease since 2009.  Old infarctions affecting the thalami, hemispheric white matter, genu of the corpus callosum and right parietal cortex at the vertex.   Electronically Signed   By: Nelson Chimes M.D.   On: 05/22/2014 13:24   Mr Brain Wo Contrast  05/22/2014   CLINICAL DATA:  Right-sided weakness and numbness  EXAM: MRI HEAD WITHOUT CONTRAST  MRA HEAD WITHOUT CONTRAST  TECHNIQUE: Multiplanar, multiecho pulse sequences of the brain and  surrounding structures were obtained without intravenous contrast. Angiographic images of the head were obtained using MRA technique without contrast.  COMPARISON:  Head CT 05/20/2014. MRI 12/21/2009. CT angiography 05/19/2008  FINDINGS: MRI HEAD FINDINGS  No abnormality is seen affecting the brainstem or cerebellum. There are old small vessel type ischemic changes present affecting the thalami an the cerebral hemispheric deep white matter bilaterally, with old infarction at the genu of the corpus callosum. There is an old small cortical infarction at the right parietal vertex. There are numerous acute infarctions within the left hemisphere including involvement of the head of the caudate and the cortical and  subcortical brain in the frontal, posterior temporal and parietal regions. The findings could be due to a combination of watershed infarction and micro embolic infarctions. No large confluent infarction. No hemorrhage. No swelling or shift.  No evidence of neoplastic mass lesion. No hydrocephalus or extra-axial collection. No pituitary mass. No inflammatory sinus disease.  MRA HEAD FINDINGS  Both internal carotid arteries are widely patent through the skullbase. Both carotid siphons are widely patent. The right internal carotid artery supplies the right middle cerebral artery. There are multiple stenosis E is an 8 diminished number of branches demonstrated. There is very minimal flow and to the anterior cerebral arteries. The left supra clinoid internal carotid artery is patent. There is a severe stenosis of the left M1 segment, 90% or greater. Flow is demonstrated distal to that, but there are a diminished number of distal MCA branches. There is very poor flow a in the anterior cerebral system. The left PCA takes a fetal origin from the anterior circulation. There is a large posterior communicating artery on the right. Distal branch vessels of the PCA territories appear irregular. Both vertebral arteries are patent with the right being dominant. Left vertebral artery supplies PICA and continues on as a very small vessel, patent to the basilar. The basilar shows mild atherosclerotic irregularity but no focal stenosis. There is mild bulb was deformity of the distal basilar tip but I would not categorize this as a frank aneurysm. Anterior and middle cerebral stress that superior cerebellar and posterior cerebral arteries show flow bilaterally. As noted previously, there is left fetal origin an a large right posterior communicating artery. Distal PCA branch vessels show irregularity.  IMPRESSION: Multiple acute infarctions in the left hemisphere that could be a combination of watershed infarctions and micro embolic  infarctions. MR angiography shows advanced intracranial disease with very limited flow and the anterior cerebral artery territories and multiple middle cerebral stenoses, particularly severe and notable at the M1 segment on the left with the vessel shows 90% stenosis. Distal branch vessels appear reduced in number an show irregularity. The findings could be due to widespread advanced atherosclerotic disease or vasculitis. This represents a marked worsening of disease since 2009.  Old infarctions affecting the thalami, hemispheric white matter, genu of the corpus callosum and right parietal cortex at the vertex.   Electronically Signed   By: Nelson Chimes M.D.   On: 05/22/2014 13:24    Electrocardiogram: Pending  Problem List  Principal Problem:   Acute CVA (cerebrovascular accident) Active Problems:   Non-compliance   History of cardioembolic stroke   Status post device closure of ASD   HTN (hypertension), benign   DM type 2 (diabetes mellitus, type 2)   Assessment: This is a 48 year old, African American female, who presents with right upper extremity weakness, and is found to have multiple acute infarcts in the left hemisphere. She  is noncompliant with her medications. She's had a previous stroke, which was thought to be cardioembolic in 0177. She's had closure of her atrial septal defect in 2012.  Plan: #1 acute cerebrovascular accident: Patient's symptoms started about 3 days ago. She is outside the window for TPA. We will admit her to the hospital. She'll be placed on aspirin for now. Swallow screen. Stroke workup will be initiated in the form of echocardiogram, Carotid Dopplers. Hypercoagulable panel will be ordered as well. Neurology has been consulted. PT, OT and speech pathologist will be consulted as well. EKG has not been done and has been ordered. Chest x-ray has also been ordered.  #2 history of hypertension: Blood pressure is elevated at this time. We will allow permissive  hypertension. Hold her oral agents for now. Hydralazine for significantly elevated. BP.  #3 history of diabetes mellitus, type II: I did not see any diabetic medications on her home medication list. We will check HbA1c. We will place her on sliding scale coverage.  #4 previous history of cardioembolic stroke in 9390 and closure of atrial septal defect in 2012.  DVT Prophylaxis: Heparin Code Status: Full code Family Communication: No family at bedside  Disposition Plan: Admit to telemetry   Further management decisions will depend on results of further testing and patient's response to treatment.   [facility]  [facility] Pager [phone]  If 7PM-7AM, please contact night-coverage [email or web address] Password [email or web address]  05/22/2014, 2:41 PM  Disclaimer: This note was dictated with voice recognition software. Similar sounding words can inadvertently be transcribed and may not be corrected upon review.

## 2014-05-22 NOTE — ED Notes (Signed)
Pt stated she was claustrophobic when transporter came to take to MRI and 1mg  ativan was given

## 2014-05-22 NOTE — ED Notes (Signed)
Pt continues to be monitored by 5 lead, blood pressure, and pulse ox.  

## 2014-05-22 NOTE — ED Notes (Signed)
Pt was driving and noticed numbness and tingling and noticed that she was unable to use her right hand to wipe after using the bathroom 11pm on 05/21/14.  Pt states she went to bed and tried to go to work this morning but when she got to work they told her she needed to come to the ED.  Pt is out of the window for TNP.

## 2014-05-22 NOTE — ED Notes (Signed)
Pt is still off floor with MRI

## 2014-05-22 NOTE — ED Notes (Signed)
Back from MRI.  Hospitalist at bedside.

## 2014-05-22 NOTE — ED Notes (Signed)
Pt undressed, in gown, on monitor, continuous pulse oximetry and blood pressure cuff; warm blankets given 

## 2014-05-22 NOTE — Consult Note (Addendum)
Referring Physician: Micheline Mazeocherty    Chief Complaint: Stroke  HPI:                                                                                                                                         Porfirio OarDutchess D Bubolz is an 48 y.o. female who admits to not taking ASA every day. She states on Tuesday she was in her car and noted her right arm went numb and she was unable to move it with good strength.  This lasted for about 40 minutes and did not fully resolve.  On Wednesday when she went to work (she is a Engineer, civil (consulting)nurse) Her MD noted she was not writing well and her speech was off.  He was concerned and sent her for a CT head which was negative. On Thursday her symptoms worsened and today since her symptoms did not resolve she came to ED.  Currently she is showing expressive aphasia and having difficulty giving detailed history.  She does note she had a PFO closed in 2009 and has no known history of clotting deficiency or Sickle cell.   Date last known well: Date: 05/19/2014 Time last known well: Unable to determine tPA Given: No: out of window  Past Medical History  Diagnosis Date  . Hypertension   . Diabetes mellitus without complication   . Stroke 2009    Past Surgical History  Procedure Laterality Date  . Cardiac surgery  Closed hole in heart in 2009    Family History  Problem Relation Age of Onset  . Hypertension Mother   . Hypertension Father    Social History:  reports that she has never smoked. She does not have any smokeless tobacco history on file. She reports that she does not drink alcohol or use illicit drugs.  Allergies:  Allergies  Allergen Reactions  . Imitrex [Sumatriptan] Anaphylaxis    Medications:                                                                                                                           Current Facility-Administered Medications  Medication Dose Route Frequency Provider Last Rate Last Dose  . aspirin chewable tablet 324 mg  324 mg  Oral Once Renne CriglerJoshua Geiple, PA-C       Current Outpatient Prescriptions  Medication Sig Dispense Refill  . aspirin EC 81 MG tablet Take 81  mg by mouth daily.      . Azilsartan-Chlorthalidone (EDARBYCLOR) 40-25 MG TABS Take 1 tablet by mouth daily.      . fluconazole (DIFLUCAN) 150 MG tablet Take 150 mg by mouth daily as needed (for yeast infections).      . nebivolol (BYSTOLIC) 10 MG tablet Take 30 mg by mouth daily.      Marland Kitchen oxyCODONE-acetaminophen (PERCOCET) 10-325 MG per tablet Take 1 tablet by mouth every 6 (six) hours as needed for pain.      . promethazine (PHENERGAN) 25 MG tablet Take 25 mg by mouth every 6 (six) hours as needed for nausea or vomiting.      Marland Kitchen tiZANidine (ZANAFLEX) 4 MG tablet Take 4 mg by mouth every 6 (six) hours as needed for muscle spasms.         ROS:                                                                                                                                       History obtained from the patient  General ROS: negative for - chills, fatigue, fever, night sweats, weight gain or weight loss Psychological ROS: negative for - behavioral disorder, hallucinations, memory difficulties, mood swings or suicidal ideation Ophthalmic ROS: negative for - blurry vision, double vision, eye pain or loss of vision ENT ROS: negative for - epistaxis, nasal discharge, oral lesions, sore throat, tinnitus or vertigo Allergy and Immunology ROS: negative for - hives or itchy/watery eyes Hematological and Lymphatic ROS: negative for - bleeding problems, bruising or swollen lymph nodes Endocrine ROS: negative for - galactorrhea, hair pattern changes, polydipsia/polyuria or temperature intolerance Respiratory ROS: negative for - cough, hemoptysis, shortness of breath or wheezing Cardiovascular ROS: negative for - chest pain, dyspnea on exertion, edema or irregular heartbeat Gastrointestinal ROS: negative for - abdominal pain, diarrhea, hematemesis, nausea/vomiting or stool  incontinence Genito-Urinary ROS: negative for - dysuria, hematuria, incontinence or urinary frequency/urgency Musculoskeletal ROS: negative for - joint swelling or muscular weakness Neurological ROS: as noted in HPI Dermatological ROS: negative for rash and skin lesion changes  Neurologic Examination:                                                                                                      Blood pressure 170/93, pulse 58, temperature 98.1 F (36.7 C), temperature source Oral, resp. rate 12, height 5\' 6"  (1.676 m), weight 106.595 kg (235 lb), SpO2 100.00%.  General: NAD Mental Status: Alert, oriented, thought content  appropriate.  Speech fluent with evidence of expressive aphasia.  Able to follow 3 step commands without difficulty. Cranial Nerves: II: Discs flat bilaterally; Visual fields grossly normal, pupils equal, round, reactive to light and accommodation III,IV, VI: ptosis not present, extra-ocular motions intact bilaterally V,VII: smile asymmetric on the right, facial light touch sensation decreased on the right VIII: hearing normal bilaterally IX,X: gag reflex present XI: bilateral shoulder shrug XII: midline tongue extension without atrophy or fasciculations  Motor: Right : Upper extremity   2/5    Left:     Upper extremity   5/5  Lower extremity   4-/5     Lower extremity   5/5 Tone and bulk:normal tone throughout; no atrophy noted Sensory: Pinprick and light touch intact throughout, bilaterally Deep Tendon Reflexes:  Right: Upper Extremity   Left: Upper extremity   biceps (C-5 to C-6) 2/4   biceps (C-5 to C-6) 2/4 tricep (C7) 2/4    triceps (C7) 2/4 Brachioradialis (C6) 2/4  Brachioradialis (C6) 2/4  Lower Extremity Lower Extremity  quadriceps (L-2 to L-4) 2/4   quadriceps (L-2 to L-4) 2/4 Achilles (S1) 1/4   Achilles (S1) 1/4  Plantars: Right: downgoing   Left: downgoing Cerebellar: normal finger-to-nose on the left,  normal heel-to-shin test Gait: not  tested CV: pulses palpable throughout   Lab Results: Basic Metabolic Panel:  Recent Labs Lab 05/22/14 1045  NA 142  K 4.8  CL 105  CO2 26  GLUCOSE 198*  BUN 16  CREATININE 0.92  CALCIUM 9.3    Liver Function Tests:  Recent Labs Lab 05/22/14 1045  AST 22  ALT 21  ALKPHOS 118*  BILITOT 0.3  PROT 7.6  ALBUMIN 3.6   No results found for this basename: LIPASE, AMYLASE,  in the last 168 hours No results found for this basename: AMMONIA,  in the last 168 hours  CBC:  Recent Labs Lab 05/22/14 1045  WBC 5.5  NEUTROABS 3.4  HGB 13.7  HCT 42.0  MCV 85.9  PLT 216    Cardiac Enzymes: No results found for this basename: CKTOTAL, CKMB, CKMBINDEX, TROPONINI,  in the last 168 hours  Lipid Panel: No results found for this basename: CHOL, TRIG, HDL, CHOLHDL, VLDL, LDLCALC,  in the last 168 hours  CBG: No results found for this basename: GLUCAP,  in the last 168 hours  Microbiology: No results found for this or any previous visit.  Coagulation Studies:  Recent Labs  05/22/14 1045  LABPROT 13.2  INR 1.00    Imaging: Ct Head Wo Contrast  05/20/2014   CLINICAL DATA:  Right-sided weakness for 2 days. History of cerebral ischemia in 2009 with residual weakness. History of migraine headaches.  EXAM: CT HEAD WITHOUT CONTRAST  TECHNIQUE: Contiguous axial images were obtained from the base of the skull through the vertex without intravenous contrast.  COMPARISON:  MRI brain 03/10/2008.  Head CT 05/15/2010.  FINDINGS: There is no evidence of acute intracranial hemorrhage, mass lesion, brain edema or extra-axial fluid collection. The ventricles and subarachnoid spaces are appropriately sized for age. There is no CT evidence of acute cortical infarction. There are stable chronic ischemic changes in the right frontal periventricular white matter. There is questionable new ill-defined low-density anteriorly in the right thalamus on image 12.  The visualized paranasal sinuses,  mastoid air cells and middle ears are clear. The calvarium is intact.  IMPRESSION: No definite acute intracranial findings or explanation for right-sided weakness. There is questionable new ill-defined low-density anteriorly  in the right thalamus on a single image.   Electronically Signed   By: Roxy Horseman M.D.   On: 05/20/2014 18:33   Mr Maxine Glenn Head Wo Contrast  05/22/2014   CLINICAL DATA:  Right-sided weakness and numbness  EXAM: MRI HEAD WITHOUT CONTRAST  MRA HEAD WITHOUT CONTRAST  TECHNIQUE: Multiplanar, multiecho pulse sequences of the brain and surrounding structures were obtained without intravenous contrast. Angiographic images of the head were obtained using MRA technique without contrast.  COMPARISON:  Head CT 05/20/2014. MRI 12/21/2009. CT angiography 05/19/2008  FINDINGS: MRI HEAD FINDINGS  No abnormality is seen affecting the brainstem or cerebellum. There are old small vessel type ischemic changes present affecting the thalami an the cerebral hemispheric deep white matter bilaterally, with old infarction at the genu of the corpus callosum. There is an old small cortical infarction at the right parietal vertex. There are numerous acute infarctions within the left hemisphere including involvement of the head of the caudate and the cortical and subcortical brain in the frontal, posterior temporal and parietal regions. The findings could be due to a combination of watershed infarction and micro embolic infarctions. No large confluent infarction. No hemorrhage. No swelling or shift.  No evidence of neoplastic mass lesion. No hydrocephalus or extra-axial collection. No pituitary mass. No inflammatory sinus disease.  MRA HEAD FINDINGS  Both internal carotid arteries are widely patent through the skullbase. Both carotid siphons are widely patent. The right internal carotid artery supplies the right middle cerebral artery. There are multiple stenosis E is an 8 diminished number of branches demonstrated. There  is very minimal flow and to the anterior cerebral arteries. The left supra clinoid internal carotid artery is patent. There is a severe stenosis of the left M1 segment, 90% or greater. Flow is demonstrated distal to that, but there are a diminished number of distal MCA branches. There is very poor flow a in the anterior cerebral system. The left PCA takes a fetal origin from the anterior circulation. There is a large posterior communicating artery on the right. Distal branch vessels of the PCA territories appear irregular. Both vertebral arteries are patent with the right being dominant. Left vertebral artery supplies PICA and continues on as a very small vessel, patent to the basilar. The basilar shows mild atherosclerotic irregularity but no focal stenosis. There is mild bulb was deformity of the distal basilar tip but I would not categorize this as a frank aneurysm. Anterior and middle cerebral stress that superior cerebellar and posterior cerebral arteries show flow bilaterally. As noted previously, there is left fetal origin an a large right posterior communicating artery. Distal PCA branch vessels show irregularity.  IMPRESSION: Multiple acute infarctions in the left hemisphere that could be a combination of watershed infarctions and micro embolic infarctions. MR angiography shows advanced intracranial disease with very limited flow and the anterior cerebral artery territories and multiple middle cerebral stenoses, particularly severe and notable at the M1 segment on the left with the vessel shows 90% stenosis. Distal branch vessels appear reduced in number an show irregularity. The findings could be due to widespread advanced atherosclerotic disease or vasculitis. This represents a marked worsening of disease since 2009.  Old infarctions affecting the thalami, hemispheric white matter, genu of the corpus callosum and right parietal cortex at the vertex.   Electronically Signed   By: Paulina Fusi M.D.   On:  05/22/2014 13:24   Mr Brain Wo Contrast  05/22/2014   CLINICAL DATA:  Right-sided weakness and numbness  EXAM: MRI HEAD WITHOUT CONTRAST  MRA HEAD WITHOUT CONTRAST  TECHNIQUE: Multiplanar, multiecho pulse sequences of the brain and surrounding structures were obtained without intravenous contrast. Angiographic images of the head were obtained using MRA technique without contrast.  COMPARISON:  Head CT 05/20/2014. MRI 12/21/2009. CT angiography 05/19/2008  FINDINGS: MRI HEAD FINDINGS  No abnormality is seen affecting the brainstem or cerebellum. There are old small vessel type ischemic changes present affecting the thalami an the cerebral hemispheric deep white matter bilaterally, with old infarction at the genu of the corpus callosum. There is an old small cortical infarction at the right parietal vertex. There are numerous acute infarctions within the left hemisphere including involvement of the head of the caudate and the cortical and subcortical brain in the frontal, posterior temporal and parietal regions. The findings could be due to a combination of watershed infarction and micro embolic infarctions. No large confluent infarction. No hemorrhage. No swelling or shift.  No evidence of neoplastic mass lesion. No hydrocephalus or extra-axial collection. No pituitary mass. No inflammatory sinus disease.  MRA HEAD FINDINGS  Both internal carotid arteries are widely patent through the skullbase. Both carotid siphons are widely patent. The right internal carotid artery supplies the right middle cerebral artery. There are multiple stenosis E is an 8 diminished number of branches demonstrated. There is very minimal flow and to the anterior cerebral arteries. The left supra clinoid internal carotid artery is patent. There is a severe stenosis of the left M1 segment, 90% or greater. Flow is demonstrated distal to that, but there are a diminished number of distal MCA branches. There is very poor flow a in the anterior  cerebral system. The left PCA takes a fetal origin from the anterior circulation. There is a large posterior communicating artery on the right. Distal branch vessels of the PCA territories appear irregular. Both vertebral arteries are patent with the right being dominant. Left vertebral artery supplies PICA and continues on as a very small vessel, patent to the basilar. The basilar shows mild atherosclerotic irregularity but no focal stenosis. There is mild bulb was deformity of the distal basilar tip but I would not categorize this as a frank aneurysm. Anterior and middle cerebral stress that superior cerebellar and posterior cerebral arteries show flow bilaterally. As noted previously, there is left fetal origin an a large right posterior communicating artery. Distal PCA branch vessels show irregularity.  IMPRESSION: Multiple acute infarctions in the left hemisphere that could be a combination of watershed infarctions and micro embolic infarctions. MR angiography shows advanced intracranial disease with very limited flow and the anterior cerebral artery territories and multiple middle cerebral stenoses, particularly severe and notable at the M1 segment on the left with the vessel shows 90% stenosis. Distal branch vessels appear reduced in number an show irregularity. The findings could be due to widespread advanced atherosclerotic disease or vasculitis. This represents a marked worsening of disease since 2009.  Old infarctions affecting the thalami, hemispheric white matter, genu of the corpus callosum and right parietal cortex at the vertex.   Electronically Signed   By: Paulina Fusi M.D.   On: 05/22/2014 13:24    Felicie Morn PA-C [facility] [phone]  05/22/2014, 2:28 PM  Patient seen and examined.  Clinical course and management discussed.  Necessary edits performed.  I agree with the above.  Assessment and plan of care developed and discussed below.    Assessment: 48 y.o. female  presenting with right upper extremity weakness and dysarthria. MRI  of the brain reviewed and shows acute left hemispheric CVA's in the MCA and watershed distribution. MRA reveals 90% left M1 artery stenosis.  tPA was not administered due to being out of the time window. Further work up recommended.  Patient on ASA at home.  Stroke Risk Factors - diabetes mellitus and hypertension  Recommendations: 1. HgbA1c, fasting lipid panel 2. PT consult, OT consult, Speech consult 3. Echocardiogram 4. Carotid dopplers 5. Prophylactic therapy-Antiplatelet med: Aspirin - dose 325mg  daily 6. Due to significant stenosis would liberalize BP 7. Telemetry monitoring 8. Frequent neuro checks 9. Vascular consult    Thana Farr, MD [facility] [phone]  05/22/2014  6:03 PM

## 2014-05-22 NOTE — ED Provider Notes (Signed)
CSN: [id number]     Arrival date & time 05/22/14  0903 History   First MD Initiated Contact with Patient 05/22/14 0915     Chief Complaint  Patient presents with  . Extremity Weakness     (Consider location/radiation/quality/duration/timing/severity/associated sxs/prior Treatment) HPI Comments: Patient with past medical history of CVA without residual deficits -- presents with complaint of right sided weakness which began 3 days ago. Patient complains of severe weakness in her right arm and milder weakness in her right leg and right face. She states that she is stumbling more with walking. She denies headache or head injury. No fevers, nausea or vomiting. No vision change. Patient saw her PCP and she had a CT of her head done 2 days ago showing no acute changes. Patient states that she was told to the hospital today by her primary care physician. The onset of this condition was acute. The course is constant. Aggravating factors: none. Alleviating factors: none.    Patient is a 48 y.o. female presenting with extremity weakness. The history is provided by the patient and medical records.  Extremity Weakness Associated symptoms include weakness (Right arm, right leg). Pertinent negatives include no chest pain, congestion, fever, headaches, nausea, neck pain, numbness, rash or vomiting.    Past Medical History  Diagnosis Date  . Hypertension   . Diabetes mellitus without complication   . Stroke 2009   Past Surgical History  Procedure Laterality Date  . Cardiac surgery  Closed hole in heart in 2009   No family history on file. History  Substance Use Topics  . Smoking status: Never Smoker   . Smokeless tobacco: Not on file  . Alcohol Use: No   OB History   No data available     Review of Systems  Constitutional: Negative for fever.  HENT: Negative for congestion, dental problem, rhinorrhea and sinus pressure.   Eyes: Negative for photophobia, discharge, redness and visual  disturbance.  Respiratory: Negative for shortness of breath.   Cardiovascular: Negative for chest pain.  Gastrointestinal: Negative for nausea and vomiting.  Musculoskeletal: Positive for extremity weakness and gait problem. Negative for neck pain and neck stiffness.  Skin: Negative for rash.  Neurological: Positive for speech difficulty and weakness (Right arm, right leg). Negative for syncope, facial asymmetry, light-headedness, numbness and headaches.  Psychiatric/Behavioral: Negative for confusion.    Allergies  Review of patient's allergies indicates not on file.  Home Medications   Prior to Admission medications   Not on File   BP 179/94  Pulse 67  Temp(Src) 98.4 F (36.9 C) (Oral)  Resp 12  SpO2 99%  Physical Exam  Nursing note and vitals reviewed. Constitutional: She is oriented to person, place, and time. She appears well-developed and well-nourished.  HENT:  Head: Normocephalic and atraumatic.  Right Ear: Tympanic membrane, external ear and ear canal normal.  Left Ear: Tympanic membrane, external ear and ear canal normal.  Nose: Nose normal.  Mouth/Throat: Uvula is midline, oropharynx is clear and moist and mucous membranes are normal.  Eyes: Conjunctivae, EOM and lids are normal. Pupils are equal, round, and reactive to light. Right eye exhibits no nystagmus. Left eye exhibits no nystagmus.  Neck: Normal range of motion. Neck supple.  No carotid bruits  Cardiovascular: Normal rate and regular rhythm.   Pulmonary/Chest: Effort normal and breath sounds normal.  Abdominal: Soft. There is no tenderness.  Musculoskeletal:       Cervical back: She exhibits normal range of motion, no tenderness and  no bony tenderness.  Neurological: She is alert and oriented to person, place, and time. She has normal reflexes. A cranial nerve deficit (R facial weakness, dysarthria) is present. No sensory deficit. She exhibits abnormal muscle tone. She displays a negative Romberg sign.  Coordination normal. GCS eye subscore is 4. GCS verbal subscore is 5. GCS motor subscore is 6.  Patient unable to lift R arm against gravity. Patient can lift R leg off bed but cannot hold up for more than 2-3 seconds. Normal R ankle dorsiflexion. Weak R ankle plantar flexion.   Skin: Skin is warm and dry.  Psychiatric: She has a normal mood and affect.    ED Course  Procedures (including critical care time) Labs Review Labs Reviewed  COMPREHENSIVE METABOLIC PANEL - Abnormal; Notable for the following:    Glucose, Bld 198 (*)    Alkaline Phosphatase 118 (*)    GFR calc non Af Amer 73 (*)    GFR calc Af Amer 85 (*)    All other components within normal limits  PROTIME-INR  APTT  CBC  DIFFERENTIAL  ETHANOL  URINE RAPID DRUG SCREEN (HOSP PERFORMED)  URINALYSIS, ROUTINE W REFLEX MICROSCOPIC  I-STAT TROPOININ, ED    Imaging Review Ct Head Wo Contrast  05/20/2014   CLINICAL DATA:  Right-sided weakness for 2 days. History of cerebral ischemia in 2009 with residual weakness. History of migraine headaches.  EXAM: CT HEAD WITHOUT CONTRAST  TECHNIQUE: Contiguous axial images were obtained from the base of the skull through the vertex without intravenous contrast.  COMPARISON:  MRI brain 03/10/2008.  Head CT 05/15/2010.  FINDINGS: There is no evidence of acute intracranial hemorrhage, mass lesion, brain edema or extra-axial fluid collection. The ventricles and subarachnoid spaces are appropriately sized for age. There is no CT evidence of acute cortical infarction. There are stable chronic ischemic changes in the right frontal periventricular white matter. There is questionable new ill-defined low-density anteriorly in the right thalamus on image 12.  The visualized paranasal sinuses, mastoid air cells and middle ears are clear. The calvarium is intact.  IMPRESSION: No definite acute intracranial findings or explanation for right-sided weakness. There is questionable new ill-defined low-density  anteriorly in the right thalamus on a single image.   Electronically Signed   By: Roxy Horseman M.D.   On: 05/20/2014 18:33   Mr Maxine Glenn Head Wo Contrast  05/22/2014   CLINICAL DATA:  Right-sided weakness and numbness  EXAM: MRI HEAD WITHOUT CONTRAST  MRA HEAD WITHOUT CONTRAST  TECHNIQUE: Multiplanar, multiecho pulse sequences of the brain and surrounding structures were obtained without intravenous contrast. Angiographic images of the head were obtained using MRA technique without contrast.  COMPARISON:  Head CT 05/20/2014. MRI 12/21/2009. CT angiography 05/19/2008  FINDINGS: MRI HEAD FINDINGS  No abnormality is seen affecting the brainstem or cerebellum. There are old small vessel type ischemic changes present affecting the thalami an the cerebral hemispheric deep white matter bilaterally, with old infarction at the genu of the corpus callosum. There is an old small cortical infarction at the right parietal vertex. There are numerous acute infarctions within the left hemisphere including involvement of the head of the caudate and the cortical and subcortical brain in the frontal, posterior temporal and parietal regions. The findings could be due to a combination of watershed infarction and micro embolic infarctions. No large confluent infarction. No hemorrhage. No swelling or shift.  No evidence of neoplastic mass lesion. No hydrocephalus or extra-axial collection. No pituitary mass. No inflammatory sinus  disease.  MRA HEAD FINDINGS  Both internal carotid arteries are widely patent through the skullbase. Both carotid siphons are widely patent. The right internal carotid artery supplies the right middle cerebral artery. There are multiple stenosis E is an 8 diminished number of branches demonstrated. There is very minimal flow and to the anterior cerebral arteries. The left supra clinoid internal carotid artery is patent. There is a severe stenosis of the left M1 segment, 90% or greater. Flow is demonstrated distal  to that, but there are a diminished number of distal MCA branches. There is very poor flow a in the anterior cerebral system. The left PCA takes a fetal origin from the anterior circulation. There is a large posterior communicating artery on the right. Distal branch vessels of the PCA territories appear irregular. Both vertebral arteries are patent with the right being dominant. Left vertebral artery supplies PICA and continues on as a very small vessel, patent to the basilar. The basilar shows mild atherosclerotic irregularity but no focal stenosis. There is mild bulb was deformity of the distal basilar tip but I would not categorize this as a frank aneurysm. Anterior and middle cerebral stress that superior cerebellar and posterior cerebral arteries show flow bilaterally. As noted previously, there is left fetal origin an a large right posterior communicating artery. Distal PCA branch vessels show irregularity.  IMPRESSION: Multiple acute infarctions in the left hemisphere that could be a combination of watershed infarctions and micro embolic infarctions. MR angiography shows advanced intracranial disease with very limited flow and the anterior cerebral artery territories and multiple middle cerebral stenoses, particularly severe and notable at the M1 segment on the left with the vessel shows 90% stenosis. Distal branch vessels appear reduced in number an show irregularity. The findings could be due to widespread advanced atherosclerotic disease or vasculitis. This represents a marked worsening of disease since 2009.  Old infarctions affecting the thalami, hemispheric white matter, genu of the corpus callosum and right parietal cortex at the vertex.   Electronically Signed   By: Paulina FusiMark  Shogry M.D.   On: 05/22/2014 13:24   Mr Brain Wo Contrast  05/22/2014   CLINICAL DATA:  Right-sided weakness and numbness  EXAM: MRI HEAD WITHOUT CONTRAST  MRA HEAD WITHOUT CONTRAST  TECHNIQUE: Multiplanar, multiecho pulse  sequences of the brain and surrounding structures were obtained without intravenous contrast. Angiographic images of the head were obtained using MRA technique without contrast.  COMPARISON:  Head CT 05/20/2014. MRI 12/21/2009. CT angiography 05/19/2008  FINDINGS: MRI HEAD FINDINGS  No abnormality is seen affecting the brainstem or cerebellum. There are old small vessel type ischemic changes present affecting the thalami an the cerebral hemispheric deep white matter bilaterally, with old infarction at the genu of the corpus callosum. There is an old small cortical infarction at the right parietal vertex. There are numerous acute infarctions within the left hemisphere including involvement of the head of the caudate and the cortical and subcortical brain in the frontal, posterior temporal and parietal regions. The findings could be due to a combination of watershed infarction and micro embolic infarctions. No large confluent infarction. No hemorrhage. No swelling or shift.  No evidence of neoplastic mass lesion. No hydrocephalus or extra-axial collection. No pituitary mass. No inflammatory sinus disease.  MRA HEAD FINDINGS  Both internal carotid arteries are widely patent through the skullbase. Both carotid siphons are widely patent. The right internal carotid artery supplies the right middle cerebral artery. There are multiple stenosis E is an 8 diminished  number of branches demonstrated. There is very minimal flow and to the anterior cerebral arteries. The left supra clinoid internal carotid artery is patent. There is a severe stenosis of the left M1 segment, 90% or greater. Flow is demonstrated distal to that, but there are a diminished number of distal MCA branches. There is very poor flow a in the anterior cerebral system. The left PCA takes a fetal origin from the anterior circulation. There is a large posterior communicating artery on the right. Distal branch vessels of the PCA territories appear irregular.  Both vertebral arteries are patent with the right being dominant. Left vertebral artery supplies PICA and continues on as a very small vessel, patent to the basilar. The basilar shows mild atherosclerotic irregularity but no focal stenosis. There is mild bulb was deformity of the distal basilar tip but I would not categorize this as a frank aneurysm. Anterior and middle cerebral stress that superior cerebellar and posterior cerebral arteries show flow bilaterally. As noted previously, there is left fetal origin an a large right posterior communicating artery. Distal PCA branch vessels show irregularity.  IMPRESSION: Multiple acute infarctions in the left hemisphere that could be a combination of watershed infarctions and micro embolic infarctions. MR angiography shows advanced intracranial disease with very limited flow and the anterior cerebral artery territories and multiple middle cerebral stenoses, particularly severe and notable at the M1 segment on the left with the vessel shows 90% stenosis. Distal branch vessels appear reduced in number an show irregularity. The findings could be due to widespread advanced atherosclerotic disease or vasculitis. This represents a marked worsening of disease since 2009.  Old infarctions affecting the thalami, hemispheric white matter, genu of the corpus callosum and right parietal cortex at the vertex.   Electronically Signed   By: Paulina Fusi M.D.   On: 05/22/2014 13:24     EKG Interpretation None      9:53 AM Patient seen and examined. Work-up initiated. Discussed with Dr. Robley Fries who will see. No code stroke as patient is outside TPA window.   Vital signs reviewed and are as follows: Filed Vitals:   05/22/14 0950  BP: 169/93  Pulse:   Temp: 98.1 F (36.7 C)  Resp:    12:11 PM Pt unable to get MRI due to unknown type atrial septal occluder. Will call neuro for reccs/admit to medicine.   12:33 PM MRI reports MR positive for stroke. MRA added.  2:00  PM Spoke with Dr. Thad Ranger who will see.   Spoke with Dr. Rito Ehrlich who will admit.    MDM   Final diagnoses:  Acute CVA (cerebrovascular accident)   Admit for stroke    Renne Crigler, PA-C 05/22/14 1401

## 2014-05-22 NOTE — Progress Notes (Signed)
Pt arrived to 4N23. Pt alert and oriented x4. Denies pain. Pt oriented to room and unit. Tele applied and IV fluids infusing. Call bell and phone within reach. Bed alarm. Will continue to monitor.

## 2014-05-22 NOTE — ED Notes (Signed)
Asked pt to provide urine specimen pt stated she could not provide one at this time.  

## 2014-05-23 DIAGNOSIS — Z9119 Patient's noncompliance with other medical treatment and regimen: Secondary | ICD-10-CM

## 2014-05-23 DIAGNOSIS — Z91199 Patient's noncompliance with other medical treatment and regimen due to unspecified reason: Secondary | ICD-10-CM

## 2014-05-23 DIAGNOSIS — Z8673 Personal history of transient ischemic attack (TIA), and cerebral infarction without residual deficits: Secondary | ICD-10-CM

## 2014-05-23 DIAGNOSIS — Z9889 Other specified postprocedural states: Secondary | ICD-10-CM

## 2014-05-23 LAB — GLUCOSE, CAPILLARY
GLUCOSE-CAPILLARY: 132 mg/dL — AB (ref 70–99)
GLUCOSE-CAPILLARY: 119 mg/dL — AB (ref 70–99)
GLUCOSE-CAPILLARY: 166 mg/dL — AB (ref 70–99)
Glucose-Capillary: 146 mg/dL — ABNORMAL HIGH (ref 70–99)

## 2014-05-23 LAB — LIPID PANEL
Cholesterol: 178 mg/dL (ref 0–200)
HDL: 38 mg/dL — AB (ref >39)
LDL Cholesterol: 117 mg/dL — ABNORMAL HIGH (ref 0–99)
TRIGLYCERIDES: 117 mg/dL (ref <150)
Total CHOL/HDL Ratio: 4.7 RATIO
VLDL: 23 mg/dL (ref 0–40)

## 2014-05-23 LAB — HOMOCYSTEINE: HOMOCYSTEINE-NORM: 11.7 umol/L (ref 4.0–15.4)

## 2014-05-23 LAB — HEMOGLOBIN A1C
Hgb A1c MFr Bld: 8.6 % — ABNORMAL HIGH (ref <5.7)
MEAN PLASMA GLUCOSE: 200 mg/dL — AB (ref <117)

## 2014-05-23 LAB — SEDIMENTATION RATE: Sed Rate: 18 mm/hr (ref 0–22)

## 2014-05-23 MED ORDER — CLOPIDOGREL BISULFATE 75 MG PO TABS
75.0000 mg | ORAL_TABLET | Freq: Every day | ORAL | Status: DC
  Administered 2014-05-23 – 2014-05-24 (×2): 75 mg via ORAL
  Filled 2014-05-23 (×2): qty 1

## 2014-05-23 MED ORDER — ATORVASTATIN CALCIUM 10 MG PO TABS
10.0000 mg | ORAL_TABLET | Freq: Every day | ORAL | Status: DC
  Administered 2014-05-23: 10 mg via ORAL
  Filled 2014-05-23: qty 1

## 2014-05-23 MED ORDER — MORPHINE SULFATE 2 MG/ML IJ SOLN
2.0000 mg | Freq: Four times a day (QID) | INTRAMUSCULAR | Status: DC | PRN
Start: 2014-05-23 — End: 2014-05-24
  Administered 2014-05-23 – 2014-05-24 (×2): 2 mg via INTRAVENOUS
  Filled 2014-05-23 (×4): qty 1

## 2014-05-23 MED ORDER — ASPIRIN EC 81 MG PO TBEC
81.0000 mg | DELAYED_RELEASE_TABLET | Freq: Every day | ORAL | Status: DC
  Administered 2014-05-24: 81 mg via ORAL
  Filled 2014-05-23: qty 1

## 2014-05-23 MED ORDER — PNEUMOCOCCAL VAC POLYVALENT 25 MCG/0.5ML IJ INJ: 0.5000 mL | INJECTION | INTRAMUSCULAR | Status: DC

## 2014-05-23 NOTE — Progress Notes (Signed)
VASCULAR LAB PRELIMINARY  PRELIMINARY  PRELIMINARY  PRELIMINARY  Carotid Dopplers completed.    Preliminary report:  1-39% ICA stenosis.  Vertebral artery flow is antegrade.-  Shelly Shoultz, RVT 05/23/2014, 10:55 AM

## 2014-05-23 NOTE — Evaluation (Signed)
Physical Therapy Evaluation Patient Details Name: Porfirio OarDutchess D Clover MRN: [id number] DOB: [birth year] Today's Date: 05/23/2014   History of Present Illness  Patient is a 48 yo female admitted 05/22/14 with Rt UE/LE weakness, facial droop, and decreased speech.  MRI showed multiple acute infarctions in Lt hemisphere.  PMH: HTN, DM, obesity, CVA 2009, PFO closed 2009.    Clinical Impression  Patient presents with problems listed below.  Will benefit from acute PT to maximize independence prior to discharge home with family.  Recommend OP PT for balance training/mobility.    Follow Up Recommendations Outpatient PT;Supervision/Assistance - 24 hour    Equipment Recommendations  Other (comment) (TBD)    Recommendations for Other Services       Precautions / Restrictions Precautions Precautions: Fall Precaution Comments: Impulsive Restrictions Weight Bearing Restrictions: No      Mobility  Bed Mobility Overal bed mobility: Needs Assistance Bed Mobility: Supine to Sit;Sit to Supine     Supine to sit: Min guard Sit to supine: Min assist   General bed mobility comments: Verbal cues for technique.  Patient impulsive, moving covers off and sitting straight up in bed.  Patient sitting with LLE off of bed and RLE still on bed.  Cues to move RLE to floor.  Assist to bring LE's onto bed to return to supine.  Transfers Overall transfer level: Needs assistance Equipment used: None Transfers: Sit to/from Stand Sit to Stand: Min guard         General transfer comment: Verbal cues to move slowly for safety.  Assist for balance/safety.  Ambulation/Gait Ambulation/Gait assistance: Min guard Ambulation Distance (Feet): 45 Feet Assistive device: None Gait Pattern/deviations: Step-through pattern;Decreased stance time - right;Decreased step length - left;Decreased weight shift to right Gait velocity: Decreased Gait velocity interpretation: Below normal speed for age/gender General Gait  Details: Patient able to ambulate with no assistive device.  Noted slight decrease in control of RLE in stance.  Balance fairly good with gait.  Distance minimized due to headache.  Stairs            Wheelchair Mobility    Modified Rankin (Stroke Patients Only) Modified Rankin (Stroke Patients Only) Pre-Morbid Rankin Score: No symptoms Modified Rankin: Moderately severe disability     Balance                                             Pertinent Vitals/Pain Headache - 7/10    Home Living Family/patient expects to be discharged to:: Private residence Living Arrangements: Children;Other relatives (Daughter [phone]) and granddaughter ([phone])) Available Help at Discharge: Family;Available 24 hours/day Type of Home: House Home Access: Stairs to enter Entrance Stairs-Rails: None Entrance Stairs-Number of Steps: 3 Home Layout: Able to live on main level with bedroom/bathroom Home Equipment: None      Prior Function Level of Independence: Independent         Comments: Patient drives and works as Clinical biochemistCMA.     Hand Dominance   Dominant Hand: Right    Extremity/Trunk Assessment   Upper Extremity Assessment: RUE deficits/detail;Defer to OT evaluation           Lower Extremity Assessment: RLE deficits/detail RLE Deficits / Details: Strength grossly 4-/5       Communication   Communication: Expressive difficulties  Cognition Arousal/Alertness: Awake/alert Behavior During Therapy: Impulsive Overall Cognitive Status: Within Functional Limits for tasks assessed  General Comments      Exercises        Assessment/Plan    PT Assessment Patient needs continued PT services  PT Diagnosis Abnormality of gait;Acute pain;Hemiplegia dominant side   PT Problem List Decreased strength;Decreased activity tolerance;Decreased balance;Decreased mobility;Decreased safety awareness;Obesity;Pain  PT Treatment Interventions DME  instruction;Gait training;Stair training;Functional mobility training;Therapeutic activities;Balance training;Patient/family education   PT Goals (Current goals can be found in the Care Plan section) Acute Rehab PT Goals Patient Stated Goal: To stop the headache PT Goal Formulation: With patient Time For Goal Achievement: 05/30/14 Potential to Achieve Goals: Good    Frequency Min 4X/week   Barriers to discharge        Co-evaluation               End of Session Equipment Utilized During Treatment: Gait belt Activity Tolerance: Patient limited by pain;Patient limited by fatigue Patient left: in bed;with call bell/phone within reach;with bed alarm set Nurse Communication: Mobility status;Patient requests pain meds (RN reports meds given 30 min prior to PT)         Time: 1214-1228 PT Time Calculation (min): 14 min   Charges:   PT Evaluation $Initial PT Evaluation Tier I: 1 Procedure PT Treatments $Gait Training: 8-22 mins   PT G Codes:          Vena Austria 05/23/2014, 5:04 PM Durenda Hurt. Renaldo Fiddler, [facility] Acute Rehab Services Pager [phone]

## 2014-05-23 NOTE — Progress Notes (Signed)
[facility]                                                                              Patient Demographics  Patricia [name], is a 48 y.o. female, DOB - 01/18/1966, ZOX:[id number]  Admit date - 05/22/2014   Admitting Physician Osvaldo Shipper, MD  Outpatient Primary MD for the patient is Ralene Ok, MD  LOS - 1   Chief Complaint  Patient presents with  . Extremity Weakness      HPI on 05/22/2014 Patricia [name] is a 48 y.o. female with a past medical history of obesity, hypertension, diabetes, previous history of stroke in July of 2009, who was in her usual state of health until a few days ago, when she started noticing tingling and numbness in the right arm. She also felt "funny" in her right leg. This happened while she was driving. 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 He ordered a CT Head on 7/29. CT was nonspecific. Patient however, continued to have the symptoms and then today she had a lot of difficulty moving her right arm. She was subsequently brought into the emergency department. She has also noted difficulty with speaking. Denied any difficulty with swallowing. There is no history of fever, chills. No visual disturbances. No seizure activity. No urinary or stool incontinence. She admited to being noncompliant with her medications. She was supposed to be on aspirin every day, but does not taken it consistently.   Assessment & Plan   Acute cerebrovascular accident -MRI: Multiple acute infarctions in the left hemisphere that could be a combination of watershed infarctions and micro embolic infarctions. -MRA: advanced intracranial disease with very limited flow and the anterior cerebral artery territories and multiple middle cerebral stenoses, particularly severe and notable at the M1 segment on the left with the vessel shows 90% stenosis. -Carotid Doppler: 1-39% ICA stenosis. Vertebral artery flow is antegrade -Echocardiogram  pending -Pending PT and OT evaluation -Neurology consulted appreciated, and recommend continuing aspirin 81 mg and Plavix 75 mg daily as well as vascular consult -LDL 117, will start statin -Hemoglobin A1c pending -Hypercoagulable panel: Homocystine and antithrombin III within normal limits  Essential Hypertension -Allowing for MS hypertension due to to current CVA. -Continue hydralazine as needed for elevated pressures  Diabetes mellitus, type II -Patient on any medications at home -Hemoglobin A1c pending -Continue insulin sliding scale with CBG monitoring  History of cardioembolic stroke in 2009 as well as PFO and ASD  History of noncompliance -Patient counseled  Code Status: Full  Family Communication: Daughter at bedside  Disposition Plan: Admitted, pending further workup  Time Spent in minutes   30 minutes  Procedures  Carotid doppler: 1-39% ICA stenosis. Vertebral artery flow is antegrade  Consults   Neurology  DVT Prophylaxis  Heparin  Lab Results  Component Value Date   PLT 216 05/22/2014    Medications  Scheduled Meds: . aspirin  300 mg Rectal Daily   Or  . aspirin  325 mg Oral Daily  . atorvastatin  10 mg Oral q1800  . heparin  5,000 Units Subcutaneous 3 times per day  . insulin aspart  0-15 Units Subcutaneous  TID WC  . [START ON 05/24/2014] pneumococcal 23 valent vaccine  0.5 mL Intramuscular Tomorrow-1000   Continuous Infusions:  PRN Meds:.acetaminophen, acetaminophen, hydrALAZINE, oxyCODONE-acetaminophen  Antibiotics   Anti-infectives   None      Subjective:   Patricia [name] seen and examined today.  Patient still has weakness in the right upper extremity. She has some weakness but is able to move her right lower extremity. She states that she is feeling better. Patient admits to not taking her aspirin on a daily basis. Patient denies any chest pain, dizziness, headache, shortness of breath, abdominal pain at this time.  Objective:    Filed Vitals:   05/23/14 0200 05/23/14 0300 05/23/14 0400 05/23/14 1012  BP: 149/92 164/94 155/99 179/105  Pulse: 59 62 70 66  Temp:   98.3 F (36.8 C) 97.8 F (36.6 C)  TempSrc:   Oral Oral  Resp:    20  Height:      Weight:      SpO2: 98% 99% 99% 100%    Wt Readings from Last 3 Encounters:  05/22/14 106.595 kg (235 lb)    No intake or output data in the 24 hours ending 05/23/14 1110  Exam  General: Well developed, well nourished, NAD, appears stated age  HEENT: NCAT, PERRLA, EOMI, Anicteic Sclera, mucous membranes moist.   Neck: Supple, no JVD, no masses  Cardiovascular: S1 S2 auscultated, no rubs, murmurs or gallops. Regular rate and rhythm.  Respiratory: Clear to auscultation bilaterally with equal chest rise  Abdomen: Soft, obese, nontender, nondistended, + bowel sounds  Extremities: warm dry without cyanosis clubbing or edema  Neuro: AAOx3, cranial nerves grossly intact. Strength 0/5  RUE, 3/5 RLE, 5/5 LLE and LUE  Skin: Without rashes exudates or nodules  Psych: Appropriate mood and affect  Data Review   Micro Results No results found for this or any previous visit (from the past 240 hour(s)).  Radiology Reports Ct Head Wo Contrast  05/20/2014   CLINICAL DATA:  Right-sided weakness for 2 days. History of cerebral ischemia in 2009 with residual weakness. History of migraine headaches.  EXAM: CT HEAD WITHOUT CONTRAST  TECHNIQUE: Contiguous axial images were obtained from the base of the skull through the vertex without intravenous contrast.  COMPARISON:  MRI brain 03/10/2008.  Head CT 05/15/2010.  FINDINGS: There is no evidence of acute intracranial hemorrhage, mass lesion, brain edema or extra-axial fluid collection. The ventricles and subarachnoid spaces are appropriately sized for age. There is no CT evidence of acute cortical infarction. There are stable chronic ischemic changes in the right frontal periventricular white matter. There is questionable new  ill-defined low-density anteriorly in the right thalamus on image 12.  The visualized paranasal sinuses, mastoid air cells and middle ears are clear. The calvarium is intact.  IMPRESSION: No definite acute intracranial findings or explanation for right-sided weakness. There is questionable new ill-defined low-density anteriorly in the right thalamus on a single image.   Electronically Signed   By: Roxy Horseman M.D.   On: 05/20/2014 18:33   Mr Maxine Glenn Head Wo Contrast  05/22/2014   CLINICAL DATA:  Right-sided weakness and numbness  EXAM: MRI HEAD WITHOUT CONTRAST  MRA HEAD WITHOUT CONTRAST  TECHNIQUE: Multiplanar, multiecho pulse sequences of the brain and surrounding structures were obtained without intravenous contrast. Angiographic images of the head were obtained using MRA technique without contrast.  COMPARISON:  Head CT 05/20/2014. MRI 12/21/2009. CT angiography 05/19/2008  FINDINGS: MRI HEAD FINDINGS  No abnormality is seen affecting  the brainstem or cerebellum. There are old small vessel type ischemic changes present affecting the thalami an the cerebral hemispheric deep white matter bilaterally, with old infarction at the genu of the corpus callosum. There is an old small cortical infarction at the right parietal vertex. There are numerous acute infarctions within the left hemisphere including involvement of the head of the caudate and the cortical and subcortical brain in the frontal, posterior temporal and parietal regions. The findings could be due to a combination of watershed infarction and micro embolic infarctions. No large confluent infarction. No hemorrhage. No swelling or shift.  No evidence of neoplastic mass lesion. No hydrocephalus or extra-axial collection. No pituitary mass. No inflammatory sinus disease.  MRA HEAD FINDINGS  Both internal carotid arteries are widely patent through the skullbase. Both carotid siphons are widely patent. The right internal carotid artery supplies the right middle  cerebral artery. There are multiple stenosis E is an 8 diminished number of branches demonstrated. There is very minimal flow and to the anterior cerebral arteries. The left supra clinoid internal carotid artery is patent. There is a severe stenosis of the left M1 segment, 90% or greater. Flow is demonstrated distal to that, but there are a diminished number of distal MCA branches. There is very poor flow a in the anterior cerebral system. The left PCA takes a fetal origin from the anterior circulation. There is a large posterior communicating artery on the right. Distal branch vessels of the PCA territories appear irregular. Both vertebral arteries are patent with the right being dominant. Left vertebral artery supplies PICA and continues on as a very small vessel, patent to the basilar. The basilar shows mild atherosclerotic irregularity but no focal stenosis. There is mild bulb was deformity of the distal basilar tip but I would not categorize this as a frank aneurysm. Anterior and middle cerebral stress that superior cerebellar and posterior cerebral arteries show flow bilaterally. As noted previously, there is left fetal origin an a large right posterior communicating artery. Distal PCA branch vessels show irregularity.  IMPRESSION: Multiple acute infarctions in the left hemisphere that could be a combination of watershed infarctions and micro embolic infarctions. MR angiography shows advanced intracranial disease with very limited flow and the anterior cerebral artery territories and multiple middle cerebral stenoses, particularly severe and notable at the M1 segment on the left with the vessel shows 90% stenosis. Distal branch vessels appear reduced in number an show irregularity. The findings could be due to widespread advanced atherosclerotic disease or vasculitis. This represents a marked worsening of disease since 2009.  Old infarctions affecting the thalami, hemispheric white matter, genu of the corpus  callosum and right parietal cortex at the vertex.   Electronically Signed   By: Paulina Fusi M.D.   On: 05/22/2014 13:24   Mr Brain Wo Contrast  05/22/2014   CLINICAL DATA:  Right-sided weakness and numbness  EXAM: MRI HEAD WITHOUT CONTRAST  MRA HEAD WITHOUT CONTRAST  TECHNIQUE: Multiplanar, multiecho pulse sequences of the brain and surrounding structures were obtained without intravenous contrast. Angiographic images of the head were obtained using MRA technique without contrast.  COMPARISON:  Head CT 05/20/2014. MRI 12/21/2009. CT angiography 05/19/2008  FINDINGS: MRI HEAD FINDINGS  No abnormality is seen affecting the brainstem or cerebellum. There are old small vessel type ischemic changes present affecting the thalami an the cerebral hemispheric deep white matter bilaterally, with old infarction at the genu of the corpus callosum. There is an old small cortical infarction at  the right parietal vertex. There are numerous acute infarctions within the left hemisphere including involvement of the head of the caudate and the cortical and subcortical brain in the frontal, posterior temporal and parietal regions. The findings could be due to a combination of watershed infarction and micro embolic infarctions. No large confluent infarction. No hemorrhage. No swelling or shift.  No evidence of neoplastic mass lesion. No hydrocephalus or extra-axial collection. No pituitary mass. No inflammatory sinus disease.  MRA HEAD FINDINGS  Both internal carotid arteries are widely patent through the skullbase. Both carotid siphons are widely patent. The right internal carotid artery supplies the right middle cerebral artery. There are multiple stenosis E is an 8 diminished number of branches demonstrated. There is very minimal flow and to the anterior cerebral arteries. The left supra clinoid internal carotid artery is patent. There is a severe stenosis of the left M1 segment, 90% or greater. Flow is demonstrated distal to  that, but there are a diminished number of distal MCA branches. There is very poor flow a in the anterior cerebral system. The left PCA takes a fetal origin from the anterior circulation. There is a large posterior communicating artery on the right. Distal branch vessels of the PCA territories appear irregular. Both vertebral arteries are patent with the right being dominant. Left vertebral artery supplies PICA and continues on as a very small vessel, patent to the basilar. The basilar shows mild atherosclerotic irregularity but no focal stenosis. There is mild bulb was deformity of the distal basilar tip but I would not categorize this as a frank aneurysm. Anterior and middle cerebral stress that superior cerebellar and posterior cerebral arteries show flow bilaterally. As noted previously, there is left fetal origin an a large right posterior communicating artery. Distal PCA branch vessels show irregularity.  IMPRESSION: Multiple acute infarctions in the left hemisphere that could be a combination of watershed infarctions and micro embolic infarctions. MR angiography shows advanced intracranial disease with very limited flow and the anterior cerebral artery territories and multiple middle cerebral stenoses, particularly severe and notable at the M1 segment on the left with the vessel shows 90% stenosis. Distal branch vessels appear reduced in number an show irregularity. The findings could be due to widespread advanced atherosclerotic disease or vasculitis. This represents a marked worsening of disease since 2009.  Old infarctions affecting the thalami, hemispheric white matter, genu of the corpus callosum and right parietal cortex at the vertex.   Electronically Signed   By: Paulina FusiMark  Shogry M.D.   On: 05/22/2014 13:24   Dg Chest Port 1 View  05/22/2014   CLINICAL DATA:  Stroke symptoms with episode of shortness of breath yesterday  EXAM: PORTABLE CHEST - 1 VIEW  COMPARISON:  PA and lateral chest x-ray of Feb 25, 2009  FINDINGS: The lungs are adequately inflated and clear. The heart and mediastinal structures are normal. There is no pleural effusion. The bony thorax exhibits no acute abnormalities.  IMPRESSION: There is no active cardiopulmonary disease.   Electronically Signed   By: David  Swazil[place]Jordan   On: 05/22/2014 15:01    CBC  Recent Labs Lab 05/22/14 1045  WBC 5.5  HGB 13.7  HCT 42.0  PLT 216  MCV 85.9  MCH 28.0  MCHC 32.6  RDW 13.2  LYMPHSABS 1.8  MONOABS 0.3  EOSABS 0.1  BASOSABS 0.0    Chemistries   Recent Labs Lab 05/22/14 1045  NA 142  K 4.8  CL 105  CO2 26  GLUCOSE 198*  BUN 16  CREATININE 0.92  CALCIUM 9.3  AST 22  ALT 21  ALKPHOS 118*  BILITOT 0.3   ------------------------------------------------------------------------------------------------------------------ estimated creatinine clearance is 93.3 ml/min (by C-G formula based on Cr of 0.92). ------------------------------------------------------------------------------------------------------------------ No results found for this basename: HGBA1C,  in the last 72 hours ------------------------------------------------------------------------------------------------------------------  Recent Labs  05/23/14 0443  CHOL 178  HDL 38*  LDLCALC 117*  TRIG 117  CHOLHDL 4.7   ------------------------------------------------------------------------------------------------------------------ No results found for this basename: TSH, T4TOTAL, FREET3, T3FREE, THYROIDAB,  in the last 72 hours ------------------------------------------------------------------------------------------------------------------ No results found for this basename: VITAMINB12, FOLATE, FERRITIN, TIBC, IRON, RETICCTPCT,  in the last 72 hours  Coagulation profile  Recent Labs Lab 05/22/14 1045  INR 1.00    No results found for this basename: DDIMER,  in the last 72 hours  Cardiac Enzymes No results found for this basename: CK, CKMB,  TROPONINI, MYOGLOBIN,  in the last 168 hours ------------------------------------------------------------------------------------------------------------------ No components found with this basename: POCBNP,     Corliss Coggeshall D.O. on 05/23/2014 at 11:10 AM  Between 7am to 7pm - Pager - [phone]  After 7pm go to [email or web address] - password [email or web address]  And look for the night coverage person covering for me after hours  [facility] Office  [phone]

## 2014-05-23 NOTE — Progress Notes (Signed)
Stroke Team Progress Note  HISTORY Patricia [name] is an 48 y.o. female who admits to not taking ASA every day. She states on Tuesday she was in her car and noted her right arm went numb and she was unable to move it with good strength. This lasted for about 40 minutes and did not fully resolve. 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 He was concerned and sent her for a CT head which was negative. On Thursday her symptoms worsened and Friday since her symptoms did not resolve she came to ED. When seen she was showing expressive aphasia and having difficulty giving detailed history. She does note she had a PFO closed in 2009 and has no known history of clotting deficiency or Sickle cell.   Date last known well: Date: 05/19/2014  Time last known well: Unable to determine  tPA Given: No: out of window    SUBJECTIVE There no family members present. The patient's speech appears to be improved. She continues to have right hemiparesis.  OBJECTIVE Most recent Vital Signs: Filed Vitals:   05/23/14 0100 05/23/14 0200 05/23/14 0300 05/23/14 0400  BP: 162/91 149/92 164/94 155/99  Pulse: 65 59 62 70  Temp:    98.3 F (36.8 C)  TempSrc:    Oral  Resp:      Height:      Weight:      SpO2: 97% 98% 99% 99%   CBG (last 3)   Recent Labs  05/22/14 1749 05/22/14 2221 05/23/14 0701  GLUCAP 190* 107* 146*    IV Fluid Intake:     MEDICATIONS  . aspirin  300 mg Rectal Daily   Or  . aspirin  325 mg Oral Daily  . atorvastatin  10 mg Oral q1800  . heparin  5,000 Units Subcutaneous 3 times per day  . insulin aspart  0-15 Units Subcutaneous TID WC   PRN:  acetaminophen, acetaminophen, hydrALAZINE, oxyCODONE-acetaminophen  Diet:  Carb Control thin liquids Activity:  Up with assistance DVT Prophylaxis:  Subcutaneous heparin  CLINICALLY SIGNIFICANT STUDIES Basic Metabolic Panel:  Recent Labs Lab 05/22/14 1045  NA  142  K 4.8  CL 105  CO2 26  GLUCOSE 198*  BUN 16  CREATININE 0.92  CALCIUM 9.3   Liver Function Tests:  Recent Labs Lab 05/22/14 1045  AST 22  ALT 21  ALKPHOS 118*  BILITOT 0.3  PROT 7.6  ALBUMIN 3.6   CBC:  Recent Labs Lab 05/22/14 1045  WBC 5.5  NEUTROABS 3.4  HGB 13.7  HCT 42.0  MCV 85.9  PLT 216   Coagulation:  Recent Labs Lab 05/22/14 1045  LABPROT 13.2  INR 1.00   Cardiac Enzymes: No results found for this basename: CKTOTAL, CKMB, CKMBINDEX, TROPONINI,  in the last 168 hours Urinalysis:  Recent Labs Lab 05/22/14 1551  COLORURINE YELLOW  LABSPEC 1.026  PHURINE 6.0  GLUCOSEU >1000*  HGBUR NEGATIVE  BILIRUBINUR NEGATIVE  KETONESUR NEGATIVE  PROTEINUR NEGATIVE  UROBILINOGEN 1.0  NITRITE NEGATIVE  LEUKOCYTESUR NEGATIVE   Lipid Panel    Component Value Date/Time   CHOL 178 05/23/2014 0443   TRIG 117 05/23/2014 0443   HDL 38* 05/23/2014 0443   CHOLHDL 4.7 05/23/2014 0443   VLDL 23 05/23/2014 0443   LDLCALC 117* 05/23/2014 0443   HgbA1C  Lab Results  Component Value Date   HGBA1C  Value: 10.3 (NOTE) The ADA  recommends the following therapeutic goal for glycemic control related to Hgb A1c measurement: Goal of therapy: <6.5 Hgb A1c  Reference: American Diabetes Association: Clinical Practice Recommendations 2010, Diabetes Care, 2010, 33: (Suppl  1).* 03/03/2009    Urine Drug Screen:     Component Value Date/Time   LABOPIA NONE DETECTED 05/22/2014 1551   COCAINSCRNUR NONE DETECTED 05/22/2014 1551   LABBENZ NONE DETECTED 05/22/2014 1551   AMPHETMU NONE DETECTED 05/22/2014 1551   THCU NONE DETECTED 05/22/2014 1551   LABBARB NONE DETECTED 05/22/2014 1551    Alcohol Level:  Recent Labs Lab 05/22/14 1045  ETH <11    Mr Patricia [name] Head Wo Contrast 05/22/2014    Multiple acute infarctions in the left hemisphere that could be a combination of watershed infarctions and micro embolic infarctions.   MRA  05/22/2014 Advanced intracranial disease with very limited flow  and the anterior cerebral artery territories and multiple middle cerebral stenoses, particularly severe and notable at the M1 segment on the left with the vessel shows 90% stenosis. Distal branch vessels appear reduced in number an show irregularity. The findings could be due to widespread advanced atherosclerotic disease or vasculitis. This represents a marked worsening of disease since 2009.  Old infarctions affecting the thalami, hemispheric white matter, genu of the corpus callosum and right parietal cortex at the vertex.     Dg Chest Port 1 View 05/22/2014    There is no active cardiopulmonary disease.     Carotid Doppler  Preliminary report: 1-39% ICA stenosis. Vertebral artery flow is antegrade.-   2D Echocardiogram  Pending   CXR  There is no active cardiopulmonary disease.   EKG  - pending - For complete results please see formal report.   Therapy Recommendations home health speech therapy recommended. PT and OT eval pending  Physical Exam   NEUROLOGIC:   MENTAL STATUS: awake, alert, oriented, language fluent,  follows simple commands.  CRANIAL NERVES: pupils equal and reactive to light,extraocular muscles intact, facial sensation and strength symmetric, uvula midlinec, tongue midline MOTOR: normal bulk and tone, Strength - 5/5 on the left. 1-2/5 right upper extremity. 4/5 right lower extremity. SENSORY: Intact to light touch throughout. COORDINATION: finger-nose-finger normal - heel to shin normal  REFLEXES: deep tendon reflexes present and symmetric - no babinski    ASSESSMENT [name] is a 48 y.o. female presenting with speech difficulty some right hemiparesis. TPA therapy was not initiated secondary to late presentation. MRI multiple acute infarctions in the left hemisphere that could be a combination of watershed infarctions and micro embolic infarctions. MRA - M1 segment on the left with the vessel shows 90% stenosis. On aspirin 81 mg orally every day  prior to admission. Now on aspirin 325 mg orally every day for secondary stroke prevention. Patient with resultant right hemiparesis. Stroke work up underway.   Noncompliance with aspirin therapy.  Cholesterol 178; LDL 117 - no statin prior to admission. Now on Lipitor. (LDL goal for diabetic less and 70)  History of a PFO repair in 2009 by Dr. Jacinto Halim  Severe cerebrovascular disease  Diabetes mellitus - hemoglobin A1c pending  Hypertension  Previous stroke   Hospital day # 1  TREATMENT/PLAN  Changed to dual antiplatelet therapy for secondary stroke prevention in light of high-grade stenosis left M1 segment. Aspirin and Plavix x3 months then either agent alone.  Await 2-D echo and hemoglobin A1c.  Await therapy evaluations  Make followup with Dr. Jacinto Halim  Check vasculitis panel  Delton See PA-C  [facility] Pager [phone] 05/23/2014, 1:59 PM   SIGNED  I have personally examined this patient, reviewed notes, independently viewed imaging studies, participated in medical decision making and plan of care. I have made any additions or clarifications directly to the above note. Agree with note above. Likely poor compliance with antiplatelet medications contributed to current symptoms.   Pauletta BrownsZEYLIKMAN, Rainbow Salman    To contact Stroke Continuity provider, please refer to [email or web address]. After hours, contact General Neurology

## 2014-05-23 NOTE — Progress Notes (Signed)
  Echocardiogram 2D Echocardiogram has been performed.  Patricia [name], Patricia [name] 05/23/2014, 1:36 PM

## 2014-05-23 NOTE — Evaluation (Signed)
Speech Language Pathology Evaluation Patient Details Name: Patricia [name] Penning MRN: [id number] DOB: Mar 02, 1966 Today's Date: 05/23/2014 Time: [id number] SLP Time Calculation (min): 15 min  Problem List:  Patient Active Problem List   Diagnosis Date Noted  . Acute CVA (cerebrovascular accident) 05/22/2014  . Non-compliance 05/22/2014  . History of cardioembolic stroke 05/22/2014  . Status post device closure of ASD 05/22/2014  . HTN (hypertension), benign 05/22/2014  . DM type 2 (diabetes mellitus, type 2) 05/22/2014   Past Medical History:  Past Medical History  Diagnosis Date  . Hypertension   . Diabetes mellitus without complication   . Stroke 2009   Past Surgical History:  Past Surgical History  Procedure Laterality Date  . Cardiac surgery  Closed hole in heart in 2009    Amplatzer Septal Occluder Ref [id number] (not listed in op notes, document scanned under MRI with written op notes   HPI:  Pt is a 48 y.o. female with PMH of obesity, hypertension, diabetes, previous history of stroke in July of 2009. Admitted for stroke symptoms which began on 7/29 and then was admitted 7/31, at which time expressive aphasia was noted. MRI 7/31 revealed Multiple acute infarctions in the left hemisphere that could be a combination of watershed infarctions and micro embolic infarctions. SLP eval ordered as part of stroke workup.   Assessment / Plan / Recommendation Clinical Impression  The pt's overall cognition, motor speech, and receptive language skills intact for tasks assessed. She is currently presenting with mild expressive language difficulties with frequent pauses and word finding difficulties at the conversation level; confrontational naming intact. Unable to adequately assess written expression [name]/t limited R hand movement. Recommend that ST follow up with this pt x1 in acute setting to monitor progress and to assess written expression; based on present level of difficulties, recommend  outpatient or home health ST follow-up to increase expressive communication skills necessary for return to work; pt works as a Engineer, civil (consulting)nurse. Will continue to follow.    SLP Assessment  Patient needs continued Speech Lanaguage Pathology Services    Follow Up Recommendations  Home health SLP    Frequency and Duration min 1 x/week  1 week   Pertinent Vitals/Pain n/a   SLP Goals  SLP Goals Potential to Achieve Goals: Good  SLP Evaluation Prior Functioning  Cognitive/Linguistic Baseline: Within functional limits Type of Home: House  Lives With: Son Available Help at Discharge: Family Vocation: Full time employment   Cognition  Overall Cognitive Status: Within Functional Limits for tasks assessed Arousal/Alertness: Awake/alert Orientation Level: Oriented X4 Attention: Alternating Alternating Attention: Appears intact Memory: Appears intact Awareness: Appears intact Problem Solving: Appears intact Safety/Judgment: Appears intact    Comprehension  Auditory Comprehension Overall Auditory Comprehension: Appears within functional limits for tasks assessed Yes/No Questions: Within Functional Limits Commands: Within Functional Limits Conversation: Complex    Expression Expression Primary Mode of Expression: Verbal Verbal Expression Overall Verbal Expression: Impaired Initiation: No impairment Level of Generative/Spontaneous Verbalization: Conversation Repetition: No impairment Naming: No impairment Pragmatics: No impairment Non-Verbal Means of Communication: Not applicable Other Verbal Expression Comments: mild word finding difficulties at conversation level Written Expression Dominant Hand: Right Written Expression: Unable to assess (comment)   Oral / Motor Oral Motor/Sensory Function Overall Oral Motor/Sensory Function: Appears within functional limits for tasks assessed Labial ROM: Within Functional Limits Labial Symmetry: Within Functional Limits Labial Strength: Within  Functional Limits Lingual ROM: Within Functional Limits Lingual Symmetry: Within Functional Limits Lingual Strength: Within Functional Limits Motor Speech Overall Motor Speech:  Appears within functional limits for tasks assessed   GO     Metro Kung, MA, CCC-SLP 05/23/2014, 12:45 PM

## 2014-05-23 NOTE — Evaluation (Signed)
Occupational Therapy Evaluation Patient Details Name: Patricia OarDutchess D [name] MRN: [id number] DOB: [birth year] Today's Date: 05/23/2014    History of Present Illness Patient is a 48 yo female admitted 05/22/14 with Rt UE/LE weakness, facial droop, and decreased speech.  MRI showed multiple acute infarctions in Lt hemisphere.  PMH: HTN, DM, obesity, CVA 2009, PFO closed 2009.     Clinical Impression   Pt s/p above. Pt independent with ADLs, PTA. Feel pt will benefit from acute OT to increase independence prior to d/c. Recommending Outpatient OT to further address right upper extremity.     Follow Up Recommendations  Outpatient OT;Supervision/Assistance - 24 hour    Equipment Recommendations  None recommended by OT    Recommendations for Other Services       Precautions / Restrictions Precautions Precautions: Fall Precaution Comments: Impulsive Restrictions Weight Bearing Restrictions: No      Mobility Bed Mobility Overal bed mobility: Needs Assistance Bed Mobility: Supine to Sit;Sit to Supine     Supine to sit: Supervision Sit to supine: Supervision   General bed mobility comments: Cues to be aware of RUE.  Transfers Overall transfer level: Needs assistance Equipment used: None Transfers: Sit to/from Stand Sit to Stand: Min guard              Balance                                            ADL Overall ADL's : Needs assistance/impaired     Grooming: Wash/dry face;Oral care;Applying deodorant;Standing;Minimal assistance (helped pt incorporate Rt hand)           Upper Body Dressing : Sitting;Minimal assistance   Lower Body Dressing: Minimal assistance;Sit to/from stand   Toilet Transfer: Min guard;Ambulation;Comfort height toilet   Toileting- Clothing Manipulation and Hygiene: Min guard;Sit to/from stand       Functional mobility during ADLs: Min guard General ADL Comments: Encouraged pt to be using Rt hand for activities. Pt  stood at sink for grooming tasks and OT helped pt use Rt hand. Pt squeezed wash cloth with both and OT explained this was beneficial. Educated on dressing technique and we practiced. Educated on BE FAST stroke education. Recommended sitting for LB ADLs. Tried to get pt to weightbear some at sink with RUE. Told pt to be careful with RUE avoiding dangerous objects and hot surfaces due to decreased sensation.     Vision    Visual fields: Pt missed several on Right side (especially superior quadrant)-inconsistent   Tracking/Visual Pursuits:  (said her head hurt; appeared difficult)             Perception     Praxis      Pertinent Vitals/Pain C/o head hurting. Repositioned.      Hand Dominance Right   Extremity/Trunk Assessment Upper Extremity Assessment Upper Extremity Assessment: RUE deficits/detail RUE Deficits / Details: weak grasp; little to no AROM shoulder flexion RUE Sensation: decreased light touch   Lower Extremity Assessment Lower Extremity Assessment: Defer to PT evaluation       Communication Communication Communication: Expressive difficulties   Cognition Arousal/Alertness: Awake/alert Behavior During Therapy: Impulsive (impulsive at times) Overall Cognitive Status: Within Functional Limits for tasks assessed                     General Comments       Exercises  Shoulder Instructions      Home Living Family/patient expects to be discharged to:: Private residence Living Arrangements: Children;Other relatives (daughter 64 and granddaughter 51) Available Help at Discharge: Family;Available 24 hours/day Type of Home: House Home Access: Stairs to enter [employer] of Steps: 3 Entrance Stairs-Rails: None Home Layout: Able to live on main level with bedroom/bathroom     Bathroom Shower/Tub: Chief Strategy Officer: Standard     Home Equipment: None      Lives With: Son    Prior Functioning/Environment Level  of Independence: Independent        Comments: Patient drives and works as Clinical biochemist.    OT Diagnosis: Hemiplegia dominant side   OT Problem List: Decreased strength;Impaired balance (sitting and/or standing);Decreased knowledge of use of DME or AE;Decreased knowledge of precautions;Decreased safety awareness;Impaired vision/perception;Impaired UE functional use;Pain;Impaired sensation;Decreased coordination   OT Treatment/Interventions: Self-care/ADL training;Therapeutic exercise;DME and/or AE instruction;Therapeutic activities;Neuromuscular education;Visual/perceptual remediation/compensation;Patient/family education;Balance training    OT Goals(Current goals can be found in the care plan section) Acute Rehab OT Goals Patient Stated Goal: get back to work OT Goal Formulation: With patient Time For Goal Achievement: 05/30/14 Potential to Achieve Goals: Good ADL Goals Pt Will Perform Upper Body Dressing: sitting;with modified independence Pt Will Perform Lower Body Dressing: with modified independence;sit to/from stand Pt Will Transfer to Toilet: with modified independence;ambulating;regular height toilet;grab bars Pt Will Perform Toileting - Clothing Manipulation and hygiene: with modified independence;sit to/from stand Pt Will Perform Tub/Shower Transfer: Tub transfer;with supervision;ambulating;3 in 1 Additional ADL Goal #1: Pt will be independent with HEP for RUE.  OT Frequency: Min 3X/week   Barriers to D/C:            Co-evaluation              End of Session Equipment Utilized During Treatment: Gait belt Nurse Communication: Mobility status  Activity Tolerance: Patient tolerated treatment well Patient left: in bed;with call bell/phone within reach;with bed alarm set;with family/visitor present   Time: 4098-1191 OT Time Calculation (min): 28 min Charges:  OT General Charges $OT Visit: 1 Procedure OT Evaluation $Initial OT Evaluation Tier I: 1 Procedure OT  Treatments $Self Care/Home Management : 8-22 mins G-CodesEarlie Raveling OTR/L [phone] 05/23/2014, 5:19 PM

## 2014-05-24 LAB — GLUCOSE, CAPILLARY
GLUCOSE-CAPILLARY: 149 mg/dL — AB (ref 70–99)
Glucose-Capillary: 183 mg/dL — ABNORMAL HIGH (ref 70–99)

## 2014-05-24 LAB — RPR

## 2014-05-24 LAB — HIV ANTIBODY (ROUTINE TESTING W REFLEX): HIV: NONREACTIVE

## 2014-05-24 MED ORDER — CLOPIDOGREL BISULFATE 75 MG PO TABS
75.0000 mg | ORAL_TABLET | Freq: Every day | ORAL | Status: DC
Start: 2014-05-24 — End: 2014-08-27

## 2014-05-24 MED ORDER — ATORVASTATIN CALCIUM 10 MG PO TABS
10.0000 mg | ORAL_TABLET | Freq: Every day | ORAL | Status: DC
Start: 2014-05-24 — End: 2014-06-17

## 2014-05-24 NOTE — Discharge Summary (Signed)
Physician Discharge Summary  Patricia [name]:[id number] DOB: 1966/03/01 DOA: 05/22/2014  PCP: Ralene Ok, MD  Admit date: 05/22/2014 Discharge date: 05/24/2014  Time spent: 45 minutes  Recommendations for Outpatient Follow-up:  Patient will be discharged to home.  She is to continue outpatient physical and occupational therapy.  Patient should continue her medications as prescribed. Patient does have a history of noncompliance, she was strongly urged to eat her medication regimen. Patient should also see her primary care physician within one week of discharge. She will also need to follow up with Dr. Jacinto Halim, cardiologist, for followup. Patient should also follow up with Dr. Roda Shutters, neurologist, within 4 weeks of discharge. Patient continue a heart healthy/carb modified diet.  This was discussed with the patient and her son at bedside.  Discharge Diagnoses:  Principal Problem:   Acute CVA (cerebrovascular accident) Active Problems:   Non-compliance   History of cardioembolic stroke   Status post device closure of ASD   HTN (hypertension), benign   DM type 2 (diabetes mellitus, type 2)   Discharge Condition: Stable  Diet recommendation: Heart healthy/carb modified  Filed Weights   05/22/14 0950  Weight: 106.595 kg (235 lb)    History of present illness:  on 05/22/2014  Patricia [name] is a 48 y.o. female with a past medical history of obesity, hypertension, diabetes, previous history of stroke in July of 2009, who was in her usual state of health until a few days ago, when she started noticing tingling and numbness in the right arm. She also felt "funny" in her right leg. This happened while she was driving. 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 He ordered a CT Head on 7/29. CT was nonspecific. Patient however, continued to have the symptoms and then today she had a lot of difficulty moving her right arm. She was subsequently brought into  the emergency department. She has also noted difficulty with speaking. Denied any difficulty with swallowing. There is no history of fever, chills. No visual disturbances. No seizure activity. No urinary or stool incontinence. She admited to being noncompliant with her medications. She was supposed to be on aspirin every day, but does not taken it consistently.  Hospital Course:  Acute cerebrovascular accident  -MRI: Multiple acute infarctions in the left hemisphere that could be a combination of watershed infarctions and micro embolic infarctions.  -MRA: advanced intracranial disease with very limited flow and the anterior cerebral artery territories and multiple middle cerebral stenoses, particularly severe and notable at the M1 segment on the left with the vessel shows 90% stenosis.  -Carotid Doppler: 1-39% ICA stenosis. Vertebral artery flow is antegrade  -Echocardiogram: EF 55-60%, no evidence of thrombus -Pending PT and OT evaluation  -Neurology consulted appreciated, and recommend continuing aspirin 81 mg and Plavix 75 mg daily as well as vascular consult  -LDL 117, Continue statin -Hemoglobin A1c 8.6 -Hypercoagulable panel: Homocystine and antithrombin III within normal limits   Essential Hypertension  -Allowing for MS hypertension due to to current CVA.  -Continue hydralazine as needed for elevated pressures   Diabetes mellitus, type II  -No home meds were reported at admission, patient takes Januvia and Invokana -Hemoglobin A1c 8.6 -Was placed on insulin sliding scale with CBG monitoring during hospitalization -Patient will need to discuss her diabetes control with her primary care physician -Continue home meds  History of cardioembolic stroke in 2009 as well as PFO and ASD  -Patient should follow up with Dr. Jacinto Halim  cardiologist  History of noncompliance  -Patient counseled   Procedures  Carotid doppler: 1-39% ICA stenosis. Vertebral artery flow is antegrade    Echocardiogram Study Conclusions - Left ventricle: The cavity size was at the upper limits of normal. Systolic function was normal. The estimated ejection fraction was in the range of 55% to 60%. Wall motion was normal; there were no regional wall motion abnormalities.  - Atrial septum: There is no color Doppler e/o residual inter atrial shunting. Flow present within the right atrial disk without crossing the septum (normal). An Amplatzer closure device in the fossa ovalis region was present. There was no atrial level shunt. No evidence of thrombus.  Consults  Neurology  Discharge Exam: Filed Vitals:   05/24/14 0916  BP: 148/83  Pulse: 70  Temp: 98.1 F (36.7 C)  Resp: 18   Exam  General: Well developed, well nourished, NAD, appears stated age  HEENT: NCAT, mucous membranes moist.  Neck: Supple, no JVD, no masses  Cardiovascular: S1 S2 auscultated, no rubs, murmurs or gallops. Regular rate and rhythm.  Respiratory: Clear to auscultation bilaterally with equal chest rise  Abdomen: Soft, obese, nontender, nondistended, + bowel sounds  Extremities: warm dry without cyanosis clubbing or edema  Neuro: AAOx3, No new focal deficits. Strength 0-1/5 RUE, 3/5 RLE, 5/5 LLE and LUE  Skin: Without rashes exudates or nodules  Psych: Appropriate mood and affect  Discharge Instructions      Discharge Instructions   DME Other see comment    Complete by:  As directed   Outpatient: Physical therapy at least 4 times per week  Occupational therapy     Discharge instructions    Complete by:  As directed   Patient will be discharged to home.  She is to continue outpatient physical and occupational therapy.  Patient should continue her medications as prescribed. Patient does have a history of noncompliance, she was strongly urged to eat her medication regimen. Patient should also see her primary care physician within one week of discharge. She will also need to follow up with Dr. Jacinto Halim,  cardiologist, for followup. Patient should also follow up with Dr. Roda Shutters, neurologist, within 4 weeks of discharge. Patient continue a heart healthy/carb modified diet.            Medication List    STOP taking these medications       PHEN/CHLOR HC PO      TAKE these medications       aspirin EC 81 MG tablet  Take 81 mg by mouth daily.     atorvastatin 10 MG tablet  Commonly known as:  LIPITOR  Take 1 tablet (10 mg total) by mouth daily at 6 PM.     clopidogrel 75 MG tablet  Commonly known as:  PLAVIX  Take 1 tablet (75 mg total) by mouth daily.     EDARBYCLOR 40-25 MG Tabs  Generic drug:  Azilsartan-Chlorthalidone  Take 1 tablet by mouth daily.     fluconazole 150 MG tablet  Commonly known as:  DIFLUCAN  Take 150 mg by mouth daily as needed (for yeast infections).     nebivolol 10 MG tablet  Commonly known as:  BYSTOLIC  Take 30 mg by mouth daily.     oxyCODONE-acetaminophen 10-325 MG per tablet  Commonly known as:  PERCOCET  Take 1 tablet by mouth every 6 (six) hours as needed for pain.     promethazine 25 MG tablet  Commonly known as:  PHENERGAN  Take  25 mg by mouth every 6 (six) hours as needed for nausea or vomiting.     tiZANidine 4 MG tablet  Commonly known as:  ZANAFLEX  Take 4 mg by mouth every 6 (six) hours as needed for muscle spasms.       Allergies  Allergen Reactions  . Imitrex [Sumatriptan] Anaphylaxis   Follow-up Information   Follow up with Ralene Ok, MD. Schedule an appointment as soon as possible for a visit in 1 week. [facility] followup, Diabetes management)    Specialty:  Internal Medicine   Contact information:   [address] [place] [address] [phone]       Call Pamella Pert, MD. [facility] followup)    Specialty:  Cardiology   Contact information:   [address] [place] [address] [phone]       Follow up with Xu,Jindong, MD. Schedule an appointment as soon as possible for a visit in 4  weeks. [facility] followup, stroke clinic)    Specialty:  Neurology   Contact information:   [address] [place] [address] [phone]        The results of significant diagnostics from this hospitalization (including imaging, microbiology, ancillary and laboratory) are listed below for reference.    Significant Diagnostic Studies: Ct Head Wo Contrast  05/20/2014   CLINICAL DATA:  Right-sided weakness for 2 days. History of cerebral ischemia in 2009 with residual weakness. History of migraine headaches.  EXAM: CT HEAD WITHOUT CONTRAST  TECHNIQUE: Contiguous axial images were obtained from the base of the skull through the vertex without intravenous contrast.  COMPARISON:  MRI brain 03/10/2008.  Head CT 05/15/2010.  FINDINGS: There is no evidence of acute intracranial hemorrhage, mass lesion, brain edema or extra-axial fluid collection. The ventricles and subarachnoid spaces are appropriately sized for age. There is no CT evidence of acute cortical infarction. There are stable chronic ischemic changes in the right frontal periventricular white matter. There is questionable new ill-defined low-density anteriorly in the right thalamus on image 12.  The visualized paranasal sinuses, mastoid air cells and middle ears are clear. The calvarium is intact.  IMPRESSION: No definite acute intracranial findings or explanation for right-sided weakness. There is questionable new ill-defined low-density anteriorly in the right thalamus on a single image.   Electronically Signed   By: Roxy Horseman M.D.   On: 05/20/2014 18:33   Mr Maxine Glenn Head Wo Contrast  05/22/2014   CLINICAL DATA:  Right-sided weakness and numbness  EXAM: MRI HEAD WITHOUT CONTRAST  MRA HEAD WITHOUT CONTRAST  TECHNIQUE: Multiplanar, multiecho pulse sequences of the brain and surrounding structures were obtained without intravenous contrast. Angiographic images of the head were obtained using MRA technique without contrast.   COMPARISON:  Head CT 05/20/2014. MRI 12/21/2009. CT angiography 05/19/2008  FINDINGS: MRI HEAD FINDINGS  No abnormality is seen affecting the brainstem or cerebellum. There are old small vessel type ischemic changes present affecting the thalami an the cerebral hemispheric deep white matter bilaterally, with old infarction at the genu of the corpus callosum. There is an old small cortical infarction at the right parietal vertex. There are numerous acute infarctions within the left hemisphere including involvement of the head of the caudate and the cortical and subcortical brain in the frontal, posterior temporal and parietal regions. The findings could be due to a combination of watershed infarction and micro embolic infarctions. No large confluent infarction. No hemorrhage. No swelling or shift.  No evidence of neoplastic mass lesion.  No hydrocephalus or extra-axial collection. No pituitary mass. No inflammatory sinus disease.  MRA HEAD FINDINGS  Both internal carotid arteries are widely patent through the skullbase. Both carotid siphons are widely patent. The right internal carotid artery supplies the right middle cerebral artery. There are multiple stenosis E is an 8 diminished number of branches demonstrated. There is very minimal flow and to the anterior cerebral arteries. The left supra clinoid internal carotid artery is patent. There is a severe stenosis of the left M1 segment, 90% or greater. Flow is demonstrated distal to that, but there are a diminished number of distal MCA branches. There is very poor flow a in the anterior cerebral system. The left PCA takes a fetal origin from the anterior circulation. There is a large posterior communicating artery on the right. Distal branch vessels of the PCA territories appear irregular. Both vertebral arteries are patent with the right being dominant. Left vertebral artery supplies PICA and continues on as a very small vessel, patent to the basilar. The basilar  shows mild atherosclerotic irregularity but no focal stenosis. There is mild bulb was deformity of the distal basilar tip but I would not categorize this as a frank aneurysm. Anterior and middle cerebral stress that superior cerebellar and posterior cerebral arteries show flow bilaterally. As noted previously, there is left fetal origin an a large right posterior communicating artery. Distal PCA branch vessels show irregularity.  IMPRESSION: Multiple acute infarctions in the left hemisphere that could be a combination of watershed infarctions and micro embolic infarctions. MR angiography shows advanced intracranial disease with very limited flow and the anterior cerebral artery territories and multiple middle cerebral stenoses, particularly severe and notable at the M1 segment on the left with the vessel shows 90% stenosis. Distal branch vessels appear reduced in number an show irregularity. The findings could be due to widespread advanced atherosclerotic disease or vasculitis. This represents a marked worsening of disease since 2009.  Old infarctions affecting the thalami, hemispheric white matter, genu of the corpus callosum and right parietal cortex at the vertex.   Electronically Signed   By: Paulina FusiMark  Shogry M.D.   On: 05/22/2014 13:24   Mr Brain Wo Contrast  05/22/2014   CLINICAL DATA:  Right-sided weakness and numbness  EXAM: MRI HEAD WITHOUT CONTRAST  MRA HEAD WITHOUT CONTRAST  TECHNIQUE: Multiplanar, multiecho pulse sequences of the brain and surrounding structures were obtained without intravenous contrast. Angiographic images of the head were obtained using MRA technique without contrast.  COMPARISON:  Head CT 05/20/2014. MRI 12/21/2009. CT angiography 05/19/2008  FINDINGS: MRI HEAD FINDINGS  No abnormality is seen affecting the brainstem or cerebellum. There are old small vessel type ischemic changes present affecting the thalami an the cerebral hemispheric deep white matter bilaterally, with old  infarction at the genu of the corpus callosum. There is an old small cortical infarction at the right parietal vertex. There are numerous acute infarctions within the left hemisphere including involvement of the head of the caudate and the cortical and subcortical brain in the frontal, posterior temporal and parietal regions. The findings could be due to a combination of watershed infarction and micro embolic infarctions. No large confluent infarction. No hemorrhage. No swelling or shift.  No evidence of neoplastic mass lesion. No hydrocephalus or extra-axial collection. No pituitary mass. No inflammatory sinus disease.  MRA HEAD FINDINGS  Both internal carotid arteries are widely patent through the skullbase. Both carotid siphons are widely patent. The right internal carotid artery supplies the right middle  cerebral artery. There are multiple stenosis E is an 8 diminished number of branches demonstrated. There is very minimal flow and to the anterior cerebral arteries. The left supra clinoid internal carotid artery is patent. There is a severe stenosis of the left M1 segment, 90% or greater. Flow is demonstrated distal to that, but there are a diminished number of distal MCA branches. There is very poor flow a in the anterior cerebral system. The left PCA takes a fetal origin from the anterior circulation. There is a large posterior communicating artery on the right. Distal branch vessels of the PCA territories appear irregular. Both vertebral arteries are patent with the right being dominant. Left vertebral artery supplies PICA and continues on as a very small vessel, patent to the basilar. The basilar shows mild atherosclerotic irregularity but no focal stenosis. There is mild bulb was deformity of the distal basilar tip but I would not categorize this as a frank aneurysm. Anterior and middle cerebral stress that superior cerebellar and posterior cerebral arteries show flow bilaterally. As noted previously, there  is left fetal origin an a large right posterior communicating artery. Distal PCA branch vessels show irregularity.  IMPRESSION: Multiple acute infarctions in the left hemisphere that could be a combination of watershed infarctions and micro embolic infarctions. MR angiography shows advanced intracranial disease with very limited flow and the anterior cerebral artery territories and multiple middle cerebral stenoses, particularly severe and notable at the M1 segment on the left with the vessel shows 90% stenosis. Distal branch vessels appear reduced in number an show irregularity. The findings could be due to widespread advanced atherosclerotic disease or vasculitis. This represents a marked worsening of disease since 2009.  Old infarctions affecting the thalami, hemispheric white matter, genu of the corpus callosum and right parietal cortex at the vertex.   Electronically Signed   By: Paulina Fusi M.D.   On: 05/22/2014 13:24   Dg Chest Port 1 View  05/22/2014   CLINICAL DATA:  Stroke symptoms with episode of shortness of breath yesterday  EXAM: PORTABLE CHEST - 1 VIEW  COMPARISON:  PA and lateral chest x-ray of Feb 25, 2009  FINDINGS: The lungs are adequately inflated and clear. The heart and mediastinal structures are normal. There is no pleural effusion. The bony thorax exhibits no acute abnormalities.  IMPRESSION: There is no active cardiopulmonary disease.   Electronically Signed   By: David  [place]   On: 05/22/2014 15:01    Microbiology: No results found for this or any previous visit (from the past 240 hour(s)).   Labs: Basic Metabolic Panel:  Recent Labs Lab 05/22/14 1045  NA 142  K 4.8  CL 105  CO2 26  GLUCOSE 198*  BUN 16  CREATININE 0.92  CALCIUM 9.3   Liver Function Tests:  Recent Labs Lab 05/22/14 1045  AST 22  ALT 21  ALKPHOS 118*  BILITOT 0.3  PROT 7.6  ALBUMIN 3.6   No results found for this basename: LIPASE, AMYLASE,  in the last 168 hours No results found for this  basename: AMMONIA,  in the last 168 hours CBC:  Recent Labs Lab 05/22/14 1045  WBC 5.5  NEUTROABS 3.4  HGB 13.7  HCT 42.0  MCV 85.9  PLT 216   Cardiac Enzymes: No results found for this basename: CKTOTAL, CKMB, CKMBINDEX, TROPONINI,  in the last 168 hours BNP: BNP (last 3 results) No results found for this basename: PROBNP,  in the last 8760 hours CBG:  Recent Labs  Lab 05/23/14 0701 05/23/14 1235 05/23/14 1656 05/23/14 2112 05/24/14 0642  GLUCAP 146* 132* 119* 166* 149*       Signed:  Edsel Petrin  [facility] 05/24/2014, 9:16 AM

## 2014-05-24 NOTE — Care Management Note (Signed)
    Page 1 of 1   05/24/2014     7:31:34 PM CARE MANAGEMENT NOTE 05/24/2014  Patient:  Patricia [name]   Account Number:  [id number]  Date Initiated:  05/24/2014  Documentation initiated by:  Patricia [name]  Subjective/Objective Assessment:   adm: Right-sided weakness     Action/Plan:   discharge planning   Anticipated DC Date:  05/24/2014   Anticipated DC Plan:           Choice offered to / List presented to:             Status of service:  Completed, signed off Medicare Important Message given?   (If response is "NO", the following Medicare IM given date fields will be blank) Date Medicare IM given:   Medicare IM given by:   Date Additional Medicare IM given:   Additional Medicare IM given by:    Discharge Disposition:  HOME/SELF CARE  Per UR Regulation:    If discussed at Long Length of Stay Meetings, dates discussed:    Comments:  05/24/14 11:10 CM brought a list of Outpt therapist facilities per pt's request.  No other CM needs were communicated.  Patricia [name], BSN, CM [phone].

## 2014-05-24 NOTE — Progress Notes (Signed)
Patient ready for discharge; instructions given and reviewed with patient; then seen by OT; patient elected to leave without 3in1; son's will purchase a shower bench; instructed to not use the tub at home per OT; use shower.  Patient instructed on the important of her PCP follow up within 1 week and to resume her home medications; outpatient PT/OT Rx given and patient spoke with Case Management for locations prior to discharge.  Belongings returned; ready for discharge home.

## 2014-05-24 NOTE — Progress Notes (Signed)
Physical Therapy Treatment Patient Details Name: Porfirio OarDutchess D Witucki MRN: [id number] DOB: [birth year] Today's Date: 05/24/2014    History of Present Illness Patient is a 48 yo female admitted 05/22/14 with Rt UE/LE weakness, facial droop, and decreased speech.  MRI showed multiple acute infarctions in Lt hemisphere.  PMH: HTN, DM, obesity, CVA 2009, PFO closed 2009.      PT Comments    Patient continues to demonstrate decreased balance with gait.  Is able to ambulate with decreased speed without assistive device on level surfaces with supervision, however, patient with decreased balance with all high level balance activities.  Patient scored 13/21 on modified DGI balance assessment, indicating fall risk.  Recommend OP PT to continue therapy for balance/mobility at discharge.   Follow Up Recommendations  Outpatient PT;Supervision/Assistance - 24 hour     Equipment Recommendations  None recommended by PT    Recommendations for Other Services       Precautions / Restrictions Precautions Precautions: Fall Precaution Comments: Less impulsive today Restrictions Weight Bearing Restrictions: No    Mobility  Bed Mobility                  Transfers Overall transfer level: Needs assistance Equipment used: None Transfers: Sit to/from Stand Sit to Stand: Supervision         General transfer comment: Verbal cues to move slowly for safety.  Assist for balance/safety.  Ambulation/Gait Ambulation/Gait assistance: Supervision Ambulation Distance (Feet): 250 Feet Assistive device: None Gait Pattern/deviations: Step-through pattern;Decreased stance time - right;Decreased step length - left;Decreased weight shift to right Gait velocity: Decreased Gait velocity interpretation: Below normal speed for age/gender General Gait Details: Continue to note decreased control of Rt LE during stance phase.  Remains unsteady with gait, with decreased balance with high level  activities.   Stairs            Wheelchair Mobility    Modified Rankin (Stroke Patients Only) Modified Rankin (Stroke Patients Only) Pre-Morbid Rankin Score: No symptoms Modified Rankin: Moderately severe disability     Balance                                    Cognition Arousal/Alertness: Awake/alert Behavior During Therapy: WFL for tasks assessed/performed Overall Cognitive Status: Within Functional Limits for tasks assessed                      Exercises      General Comments        Pertinent Vitals/Pain     Home Living                      Prior Function            PT Goals (current goals can now be found in the care plan section) Progress towards PT goals: Progressing toward goals    Frequency  Min 4X/week    PT Plan Current plan remains appropriate    Co-evaluation             End of Session Equipment Utilized During Treatment: Gait belt Activity Tolerance: Patient limited by fatigue Patient left: in chair;with call bell/phone within reach;with family/visitor present     Time: [id number] PT Time Calculation (min): 16 min  Charges:  $Gait Training: 8-22 mins                    G  Codes:      Vena Austria 05/24/2014, 10:43 AM Durenda Hurt. Renaldo Fiddler, [facility] Acute Rehab Services Pager [phone]

## 2014-05-24 NOTE — Discharge Instructions (Signed)
Stroke Prevention Some medical conditions and behaviors are associated with an increased chance of having a stroke. You may prevent a stroke by making healthy choices and managing medical conditions. HOW CAN I REDUCE MY RISK OF HAVING A STROKE?   Stay physically active. Get at least 30 minutes of activity on most or all days.  Do not smoke. It may also be helpful to avoid exposure to secondhand smoke.  Limit alcohol use. Moderate alcohol use is considered to be:  No more than 2 drinks per day for men.  No more than 1 drink per day for nonpregnant women.  Eat healthy foods. This involves:  Eating 5 or more servings of fruits and vegetables a day.  Making dietary changes that address high blood pressure (hypertension), high cholesterol, diabetes, or obesity.  Manage your cholesterol levels.  Making food choices that are high in fiber and low in saturated fat, trans fat, and cholesterol may control cholesterol levels.  Take any prescribed medicines to control cholesterol as directed by your health care provider.  Manage your diabetes.  Controlling your carbohydrate and sugar intake is recommended to manage diabetes.  Take any prescribed medicines to control diabetes as directed by your health care provider.  Control your hypertension.  Making food choices that are low in salt (sodium), saturated fat, trans fat, and cholesterol is recommended to manage hypertension.  Take any prescribed medicines to control hypertension as directed by your health care provider.  Maintain a healthy weight.  Reducing calorie intake and making food choices that are low in sodium, saturated fat, trans fat, and cholesterol are recommended to manage weight.  Stop drug abuse.  Avoid taking birth control pills.  Talk to your health care provider about the risks of taking birth control pills if you are over 35 years old, smoke, get migraines, or have ever had a blood clot.  Get evaluated for sleep  disorders (sleep apnea).  Talk to your health care provider about getting a sleep evaluation if you snore a lot or have excessive sleepiness.  Take medicines only as directed by your health care provider.  For some people, aspirin or blood thinners (anticoagulants) are helpful in reducing the risk of forming abnormal blood clots that can lead to stroke. If you have the irregular heart rhythm of atrial fibrillation, you should be on a blood thinner unless there is a good reason you cannot take them.  Understand all your medicine instructions.  Make sure that other conditions (such as anemia or atherosclerosis) are addressed. SEEK IMMEDIATE MEDICAL CARE IF:   You have sudden weakness or numbness of the face, arm, or leg, especially on one side of the body.  Your face or eyelid droops to one side.  You have sudden confusion.  You have trouble speaking (aphasia) or understanding.  You have sudden trouble seeing in one or both eyes.  You have sudden trouble walking.  You have dizziness.  You have a loss of balance or coordination.  You have a sudden, severe headache with no known cause.  You have new chest pain or an irregular heartbeat. Any of these symptoms may represent a serious problem that is an emergency. Do not wait to see if the symptoms will go away. Get medical help at once. Call your local emergency services (911 in [place] Do not drive yourself to the hospital. Document Released: 11/16/2004 Document Revised: 02/23/2014 Document Reviewed: 04/11/2013 [employer] Patient Information 2015 [employer]. This information is not intended to replace advice given   to you by your health care provider. Make sure you discuss any questions you have with your health care provider.  

## 2014-05-24 NOTE — Progress Notes (Addendum)
Occupational Therapy Treatment Patient Details Name: Patricia [name] MRN: [id number] DOB: 02-27-1966 Today's Date: 05/24/2014    History of present illness Patient is a 48 yo female admitted 05/22/14 with Rt UE/LE weakness, facial droop, and decreased speech.  MRI showed multiple acute infarctions in Lt hemisphere.  PMH: HTN, DM, obesity, CVA 2009, PFO closed 2009.     OT comments  Education provided to pt. Recommending Outpatient OT. Pt had near fall practicing tub transfer-told pt to not step over tub at home. Pt bumping into things on Rt side during session.  Follow Up Recommendations  Outpatient OT;Supervision/Assistance - 24 hour    Equipment Recommendations  3 in 1 bedside comode    Recommendations for Other Services      Precautions / Restrictions Precautions Precautions: Fall Restrictions Weight Bearing Restrictions: No       Mobility Bed Mobility               General bed mobility comments: not assessed  Transfers Overall transfer level: Needs assistance Equipment used: None Transfers: Sit to/from Stand Sit to Stand: Supervision;Min guard            Balance                                   ADL Overall ADL's : Needs assistance/impaired     Grooming: Oral care;Set up;Supervision/safety;Standing           Upper Body Dressing : Minimal assistance;Sitting;Standing       Toilet Transfer: Min guard;Ambulation (chair)       Tub/ Shower Transfer: Maximal assistance;Ambulation;3 in 1   Functional mobility during ADLs: Min guard (Min guard-Max A for tub transfer) General ADL Comments: Reviewed stroke signs/symptoms and also told pt to avoid being around stove/hot surfaces/dangerous objects due to decreased sensation in RUE. Reviewed dressing technique. Encouraged pt to be using Rt hand. Educated on tub transfer technique which pt was initially Min guard for and then required Max A to prevent a fall in tub. Recommended pt not to  step over tub at home. Showed her how to use 3 in 1 as shower chair and back up to tub and sit on chair (chair positioned sideways).  Pt used Rt hand at sink to assist with grooming task.      Vision    Visual fields: Inconsistent   Tracking/Visual Pursuits: Able to track stimulus in all quads without difficulty             Perception     Praxis      Cognition   Behavior During Therapy: [facility] for tasks assessed/performed Overall Cognitive Status: Within Functional Limits for tasks assessed                       Extremity/Trunk Assessment               Exercises     Shoulder Instructions       General Comments      Pertinent Vitals/ Pain       No apparent distress.  Home Living Family/patient expects to be discharged to:: Private residence Living Arrangements: Children                                      Prior Functioning/Environment  Frequency Min 3X/week     Progress Toward Goals  OT Goals(current goals can now be found in the care plan section)  Progress towards OT goals: Progressing toward goals  Acute Rehab OT Goals Patient Stated Goal: not stated OT Goal Formulation: With patient Time For Goal Achievement: 05/30/14 Potential to Achieve Goals: Good ADL Goals Pt Will Perform Grooming: standing;with supervision (incorporating use of Rt hand) Pt Will Perform Upper Body Dressing: sitting;with modified independence Pt Will Perform Lower Body Dressing: with modified independence;sit to/from stand Pt Will Transfer to Toilet: with modified independence;ambulating;regular height toilet;grab bars Pt Will Perform Toileting - Clothing Manipulation and hygiene: with modified independence;sit to/from stand Pt Will Perform Tub/Shower Transfer: Tub transfer;with supervision;ambulating;3 in 1 Additional ADL Goal #1: Pt will be independent with HEP for RUE.  Plan Discharge plan remains appropriate     Co-evaluation                 End of Session Equipment Utilized During Treatment: Gait belt   Activity Tolerance Patient tolerated treatment well   Patient Left in chair;with family/visitor present   Nurse Communication Other (comment) (recommending 3 in 1)        Time: 1135-1156 OT Time Calculation (min): 21 min  Charges: OT General Charges $OT Visit: 1 Procedure OT Treatments $Self Care/Home Management : 8-22 mins    Earlie RavelingStraub, Zylen Wenig L OTR/L [phone] 05/24/2014, 12:43 PM

## 2014-05-24 NOTE — Progress Notes (Signed)
Stroke Team Progress Note  HISTORY Patricia [name] is an 48 y.o. female who admits to not taking ASA every day. She states on Tuesday she was in her car and noted her right arm went numb and she was unable to move it with good strength. This lasted for about 40 minutes and did not fully resolve. 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 He was concerned and sent her for a CT head which was negative. On Thursday her symptoms worsened and Friday since her symptoms did not resolve she came to ED. When seen she was showing expressive aphasia and having difficulty giving detailed history. She does note she had a PFO closed in 2009 and has no known history of clotting deficiency or Sickle cell.   Date last known well: Date: 05/19/2014  Time last known well: Unable to determine  tPA Given: No: out of window    SUBJECTIVE There no family members present. The patient's speech appears to be improved. She continues to have right hemiparesis.  OBJECTIVE Most recent Vital Signs: Filed Vitals:   05/23/14 2147 05/24/14 0124 05/24/14 0522 05/24/14 0916  BP: 154/102 170/87 178/100 148/83  Pulse: 76 57 62 70  Temp: 98.5 F (36.9 C) 98.8 F (37.1 C) 97.8 F (36.6 C) 98.1 F (36.7 C)  TempSrc: Oral Oral Oral Oral  Resp: 20 20 20 18   Height:      Weight:      SpO2: 96% 100% 100% 100%   CBG (last 3)   Recent Labs  05/23/14 1656 05/23/14 2112 05/24/14 0642  GLUCAP 119* 166* 149*    IV Fluid Intake:     MEDICATIONS  . aspirin EC  81 mg Oral Daily  . atorvastatin  10 mg Oral q1800  . clopidogrel  75 mg Oral Daily  . heparin  5,000 Units Subcutaneous 3 times per day  . insulin aspart  0-15 Units Subcutaneous TID WC  . pneumococcal 23 valent vaccine  0.5 mL Intramuscular Tomorrow-1000   PRN:  acetaminophen, acetaminophen, hydrALAZINE, morphine injection, oxyCODONE-acetaminophen  Diet:  Carb Control thin  liquids Activity:  Up with assistance DVT Prophylaxis:  Subcutaneous heparin  CLINICALLY SIGNIFICANT STUDIES Basic Metabolic Panel:   Recent Labs Lab 05/22/14 1045  NA 142  K 4.8  CL 105  CO2 26  GLUCOSE 198*  BUN 16  CREATININE 0.92  CALCIUM 9.3   Liver Function Tests:   Recent Labs Lab 05/22/14 1045  AST 22  ALT 21  ALKPHOS 118*  BILITOT 0.3  PROT 7.6  ALBUMIN 3.6   CBC:   Recent Labs Lab 05/22/14 1045  WBC 5.5  NEUTROABS 3.4  HGB 13.7  HCT 42.0  MCV 85.9  PLT 216   Coagulation:   Recent Labs Lab 05/22/14 1045  LABPROT 13.2  INR 1.00   Cardiac Enzymes: No results found for this basename: CKTOTAL, CKMB, CKMBINDEX, TROPONINI,  in the last 168 hours Urinalysis:   Recent Labs Lab 05/22/14 1551  COLORURINE YELLOW  LABSPEC 1.026  PHURINE 6.0  GLUCOSEU >1000*  HGBUR NEGATIVE  BILIRUBINUR NEGATIVE  KETONESUR NEGATIVE  PROTEINUR NEGATIVE  UROBILINOGEN 1.0  NITRITE NEGATIVE  LEUKOCYTESUR NEGATIVE   Lipid Panel    Component Value Date/Time   CHOL 178 05/23/2014 0443   TRIG 117 05/23/2014 0443   HDL 38* 05/23/2014 0443   CHOLHDL 4.7 05/23/2014 0443   VLDL 23 05/23/2014 0443  LDLCALC 117* 05/23/2014 0443   HgbA1C  Lab Results  Component Value Date   HGBA1C 8.6* 05/23/2014    Urine Drug Screen:     Component Value Date/Time   LABOPIA NONE DETECTED 05/22/2014 1551   COCAINSCRNUR NONE DETECTED 05/22/2014 1551   LABBENZ NONE DETECTED 05/22/2014 1551   AMPHETMU NONE DETECTED 05/22/2014 1551   THCU NONE DETECTED 05/22/2014 1551   LABBARB NONE DETECTED 05/22/2014 1551    Alcohol Level:   Recent Labs Lab 05/22/14 1045  ETH <11    Mr Maxine GlennMra Head Wo Contrast 05/22/2014    Multiple acute infarctions in the left hemisphere that could be a combination of watershed infarctions and micro embolic infarctions.   MRA  05/22/2014 Advanced intracranial disease with very limited flow and the anterior cerebral artery territories and multiple middle cerebral  stenoses, particularly severe and notable at the M1 segment on the left with the vessel shows 90% stenosis. Distal branch vessels appear reduced in number an show irregularity. The findings could be due to widespread advanced atherosclerotic disease or vasculitis. This represents a marked worsening of disease since 2009.  Old infarctions affecting the thalami, hemispheric white matter, genu of the corpus callosum and right parietal cortex at the vertex.     Dg Chest Port 1 View 05/22/2014    There is no active cardiopulmonary disease.     Carotid Doppler  Preliminary report: 1-39% ICA stenosis. Vertebral artery flow is antegrade.-   2D Echocardiogram  Left ventricle: The cavity size was at the upper limits ofnormal. Systolic function was normal.  The estimated ejection fraction was in the range of 55% to 60%. Wall motion was normal; there were no regional wall motion abnormalities. - Atrial septum: There is no color Doppler e/o residual inter atrial shunting. Flow present within the right atrial disk without crossing the septum (normal).  An Amplatzer closure device in the fossa ovalis region was present.  There was no atrial level shunt. No evidence of thrombus.    CXR  There is no active cardiopulmonary disease.   EKG  - pending - For complete results please see formal report.   Therapy Recommendations home health speech therapy recommended. Outpatient occupational and physical therapy recommended.  Physical Exam   NEUROLOGIC:   MENTAL STATUS: awake, alert, oriented, language fluent,  follows simple commands.  CRANIAL NERVES: pupils equal and reactive to light,extraocular muscles intact, facial sensation and strength symmetric, uvula midlinec, tongue midline MOTOR: normal bulk and tone, Strength - 5/5 on the left. 1-2/5 right upper extremity. 4/5 right lower extremity. SENSORY: Intact to light touch throughout. COORDINATION: finger-nose-finger normal - heel to shin normal   REFLEXES: deep tendon reflexes present and symmetric - no babinski    ASSESSMENT [name] is a 48 y.o. female presenting with speech difficulty some right hemiparesis. TPA therapy was not initiated secondary to late presentation. MRI multiple acute infarctions in the left hemisphere that could be a combination of watershed infarctions and micro embolic infarctions. MRA - M1 segment on the left with the vessel shows 90% stenosis. On aspirin 81 mg orally every day prior to admission. Now on aspirin 325 mg orally every day for secondary stroke prevention. Patient with resultant right hemiparesis. Stroke work up underway.   Noncompliance with aspirin therapy prior to admission.  Cholesterol 178; LDL 117 - no statin prior to admission. Now on Lipitor. (LDL goal for diabetic less and 70)  History of a PFO repair in 2009 by Dr. Jacinto HalimGanji  Severe cerebrovascular disease  Diabetes mellitus - hemoglobin A1c   8.6  (goal less than 7)  Hypertension  Previous stroke   Hospital day # 2  TREATMENT/PLAN  Changed to dual antiplatelet therapy for secondary stroke prevention in light of high-grade stenosis left M1 segment. Aspirin and Plavix x3 months then either agent alone.  Home health speech therapy recommended. Outpatient physical and occupational therapy recommended.  Now on Lipitor  Make followup with Dr. Jacinto Halim  Check vasculitis panel - sedimentation rate 18; RPR nonreactive; HIV nonreactive. The remainder is pending.  Followup Dr. Roda Shutters in one month  Patient for discharge today.  Delton See PA-C [facility] Pager [phone] 05/24/2014, 9:55 AM   SIGNED  I have personally examined this patient, reviewed notes, independently viewed imaging studies, participated in medical decision making and plan of care. I have made any additions or clarifications directly to the above note. Agree with note above. Likely poor compliance with antiplatelet medications  contributed to current symptoms.   Pauletta Browns    To contact Stroke Continuity provider, please refer to [email or web address]. After hours, contact General Neurology

## 2014-05-25 ENCOUNTER — Ambulatory Visit: Payer: BC Managed Care – PPO | Admitting: Neurology

## 2014-05-25 LAB — ANA: Anti Nuclear Antibody(ANA): NEGATIVE

## 2014-05-25 LAB — PROTEIN S, TOTAL: Protein S Ag, Total: 64 % (ref 60–150)

## 2014-05-25 LAB — PROTEIN C, TOTAL: Protein C, Total: 135 % (ref 72–160)

## 2014-05-25 NOTE — Progress Notes (Signed)
RETRO UR REVIEW COMPLETED 

## 2014-05-26 LAB — C4 COMPLEMENT: Complement C4, Body Fluid: 42 mg/dL — ABNORMAL HIGH (ref 10–40)

## 2014-05-26 LAB — CARDIOLIPIN ANTIBODIES, IGG, IGM, IGA
Anticardiolipin IgA: 7 APL U/mL — ABNORMAL LOW (ref <22)
Anticardiolipin IgG: 8 GPL U/mL — ABNORMAL LOW (ref <23)
Anticardiolipin IgM: 0 MPL U/mL — ABNORMAL LOW (ref <11)

## 2014-05-26 LAB — C3 COMPLEMENT: C3 COMPLEMENT: 172 mg/dL (ref 90–180)

## 2014-05-26 LAB — FACTOR 5 LEIDEN

## 2014-05-26 LAB — BETA-2-GLYCOPROTEIN I ABS, IGG/M/A
BETA-2-GLYCOPROTEIN I IGA: 27 A Units — AB (ref <20)
Beta-2 Glyco I IgG: 0 G Units (ref <20)
Beta-2-Glycoprotein I IgM: 1 M Units (ref <20)

## 2014-05-26 LAB — COMPLEMENT, TOTAL: Compl, Total (CH50): >60 U/mL — ABNORMAL HIGH (ref 31–60)

## 2014-05-26 LAB — PROTEIN C ACTIVITY: Protein C Activity: 154 % — ABNORMAL HIGH (ref 75–133)

## 2014-05-26 LAB — PROTEIN S ACTIVITY: Protein S Activity: 51 % — ABNORMAL LOW (ref 69–129)

## 2014-05-27 LAB — LUPUS ANTICOAGULANT PANEL
DRVVT: 36.1 s (ref <42.9)
LUPUS ANTICOAGULANT: NOT DETECTED
PTT Lupus Anticoagulant: 32 secs (ref 28.0–43.0)

## 2014-05-28 LAB — PROTHROMBIN GENE MUTATION

## 2014-06-17 ENCOUNTER — Ambulatory Visit (INDEPENDENT_AMBULATORY_CARE_PROVIDER_SITE_OTHER): Payer: BC Managed Care – PPO | Admitting: Neurology

## 2014-06-17 ENCOUNTER — Encounter: Payer: Self-pay | Admitting: Neurology

## 2014-06-17 VITALS — BP 146/93 | HR 76 | Ht 65.0 in | Wt 235.0 lb

## 2014-06-17 DIAGNOSIS — Q211 Atrial septal defect: Secondary | ICD-10-CM

## 2014-06-17 DIAGNOSIS — I6350 Cerebral infarction due to unspecified occlusion or stenosis of unspecified cerebral artery: Secondary | ICD-10-CM

## 2014-06-17 DIAGNOSIS — Q2111 Secundum atrial septal defect: Secondary | ICD-10-CM

## 2014-06-17 DIAGNOSIS — E785 Hyperlipidemia, unspecified: Secondary | ICD-10-CM

## 2014-06-17 DIAGNOSIS — I639 Cerebral infarction, unspecified: Secondary | ICD-10-CM

## 2014-06-17 DIAGNOSIS — Q2112 Patent foramen ovale: Secondary | ICD-10-CM | POA: Insufficient documentation

## 2014-06-17 NOTE — Patient Instructions (Signed)
-   schedule CT angio head and neck with [facility] - transcrianl doppler to evaluate for PFO - also schedule for rule out DVT in arms and legs - blood draw today to rule out conditions that may prone to have clot. - continue the ASA and plavix for another 2 month and then plavix alone - follow up with PCP closely for stoke risk factor control. - follow up in 2 months

## 2014-06-17 NOTE — Addendum Note (Signed)
Addended by: Marvel Plan on: 06/17/2014 01:59 PM   Modules accepted: Level of Service

## 2014-06-17 NOTE — Addendum Note (Signed)
Addended by: Marvel Plan on: 06/17/2014 11:48 PM   Modules accepted: Orders

## 2014-06-17 NOTE — Progress Notes (Signed)
STROKE NEUROLOGY FOLLOW UP NOTE  NAME: Patricia [name] DOB: [birth year]  REASON FOR VISIT: stroke follow up HISTORY FROM: pt and chart  Today we had the pleasure of seeing Patricia [name] in follow-up at our [facility]. Pt was accompanied by daughter and granddaughter.   History Summary 48 y.o. African American female with PMH of obesity, hypertension, diabetes, hyperlipidemia, previous embolic stroke in 2009 presumably due to PFO/ASD state post of closure by Dr. Jacinto Halim was admitted on 05/22/14 for right arm and leg numbness weakness, difficulty with speech. MRI had showed left MCA territory small multiple infarcts. MRA head showed significant intracranial stenosis. She was discharged on aspirin and Plavix dural antiplatelet therapy and outpatient PT/OT.  She had a previous CVA in 2009, MRI showed multiple bilateral small infarcts consistent with cardioembolic stroke. She was found by PFO/ASD, state post of a PFO closure with Dr. Jacinto Halim. She stated she was treated with aspirin and Plavix for some time and then Dr. Jacinto Halim took her off Plavix. She had no residual deficit left from previous stroke.  She had multiple stroke risk factors including hypertension, hyperlipidemia, diabetes, obesity. She stated that she was not a compliant with medication in the past and he now watch for her diet.  She denies any cigarette smoking, alcohol drinking, or illicit drugs.  Interval History During the interval time, the patient has been doing well. She still not able to write with right hand, and she is getting PT/OT 2/wk. Her BP at home was OK, today in clinic 146/93. Her BP has been low lately so Dr. Jacinto Halim decreased her bystolic. She saw her PCP early this month and changed her from plavix to crestor due to questionable rashes. Continue her on PO meds for DM and Sugar this morning 117.   REVIEW OF SYSTEMS: Full 14 system review of systems performed and notable only for those listed below and  in HPI above, all others are negative:   Rash, headache  The following represents the patient's updated allergies and side effects list: Allergies  Allergen Reactions  . Imitrex [Sumatriptan] Anaphylaxis    Labs since last visit of relevance include the following: Results for orders placed during the hospital encounter of 05/22/14  PROTIME-INR      Result Value Ref Range   Prothrombin Time 13.2  11.6 - 15.2 seconds   INR 1.00  0.00 - 1.49  APTT      Result Value Ref Range   aPTT 29  24 - 37 seconds  CBC      Result Value Ref Range   WBC 5.5  4.0 - 10.5 K/uL   RBC 4.89  3.87 - 5.11 MIL/uL   Hemoglobin 13.7  12.0 - 15.0 g/dL   HCT 16.1  09.6 - 04.5 %   MCV 85.9  78.0 - 100.0 fL   MCH 28.0  26.0 - 34.0 pg   MCHC 32.6  30.0 - 36.0 g/dL   RDW 40.9  81.1 - 91.4 %   Platelets 216  150 - 400 K/uL  DIFFERENTIAL      Result Value Ref Range   Neutrophils Relative % 62  43 - 77 %   Neutro Abs 3.4  1.7 - 7.7 K/uL   Lymphocytes Relative 32  12 - 46 %   Lymphs Abs 1.8  0.7 - 4.0 K/uL   Monocytes Relative 5  3 - 12 %   Monocytes Absolute 0.3  0.1 - 1.0 K/uL   Eosinophils Relative 1  0 - 5 %   Eosinophils Absolute 0.1  0.0 - 0.7 K/uL   Basophils Relative 0  0 - 1 %   Basophils Absolute 0.0  0.0 - 0.1 K/uL  COMPREHENSIVE METABOLIC PANEL      Result Value Ref Range   Sodium 142  137 - 147 mEq/L   Potassium 4.8  3.7 - 5.3 mEq/L   Chloride 105  96 - 112 mEq/L   CO2 26  19 - 32 mEq/L   Glucose, Bld 198 (*) 70 - 99 mg/dL   BUN 16  6 - 23 mg/dL   Creatinine, Ser 1.61  0.50 - 1.10 mg/dL   Calcium 9.3  8.4 - 09.6 mg/dL   Total Protein 7.6  6.0 - 8.3 g/dL   Albumin 3.6  3.5 - 5.2 g/dL   AST 22  0 - 37 U/L   ALT 21  0 - 35 U/L   Alkaline Phosphatase 118 (*) 39 - 117 U/L   Total Bilirubin 0.3  0.3 - 1.2 mg/dL   GFR calc non Af Amer 73 (*) >90 mL/min   GFR calc Af Amer 85 (*) >90 mL/min   Anion gap 11  5 - 15  URINE RAPID DRUG SCREEN (HOSP PERFORMED)      Result Value Ref Range    Opiates NONE DETECTED  NONE DETECTED   Cocaine NONE DETECTED  NONE DETECTED   Benzodiazepines NONE DETECTED  NONE DETECTED   Amphetamines NONE DETECTED  NONE DETECTED   Tetrahydrocannabinol NONE DETECTED  NONE DETECTED   Barbiturates NONE DETECTED  NONE DETECTED  URINALYSIS, ROUTINE W REFLEX MICROSCOPIC      Result Value Ref Range   Color, Urine YELLOW  YELLOW   APPearance CLOUDY (*) CLEAR   Specific Gravity, Urine 1.026  1.005 - 1.030   pH 6.0  5.0 - 8.0   Glucose, UA >1000 (*) NEGATIVE mg/dL   Hgb urine dipstick NEGATIVE  NEGATIVE   Bilirubin Urine NEGATIVE  NEGATIVE   Ketones, ur NEGATIVE  NEGATIVE mg/dL   Protein, ur NEGATIVE  NEGATIVE mg/dL   Urobilinogen, UA 1.0  0.0 - 1.0 mg/dL   Nitrite NEGATIVE  NEGATIVE   Leukocytes, UA NEGATIVE  NEGATIVE  ETHANOL      Result Value Ref Range   Alcohol, Ethyl (B) <11  0 - 11 mg/dL  PROTEIN C ACTIVITY      Result Value Ref Range   Protein C Activity 154 (*) 75 - 133 %  PROTEIN C, TOTAL      Result Value Ref Range   Protein C, Total 135  72 - 160 %  PROTEIN S ACTIVITY      Result Value Ref Range   Protein S Activity 51 (*) 69 - 129 %  PROTEIN S, TOTAL      Result Value Ref Range   Protein S Ag, Total 64  60 - 150 %  LUPUS ANTICOAGULANT PANEL      Result Value Ref Range   PTT Lupus Anticoagulant 32.0  28.0 - 43.0 secs   PTTLA Confirmation NOT APPL  <8.0 secs   PTTLA 4:1 Mix NOT APPL  28.0 - 43.0 secs   Drvvt 36.1  <42.9 secs   Drvvt confirmation NOT APPL  <1.15 Ratio   dRVVT Incubated 1:1 Mix NOT APPL  <42.9 secs   Lupus Anticoagulant NOT DETECTED  NOT DETECTED  PROTHROMBIN GENE MUTATION      Result Value Ref Range   Interpretation-PTGENE: REPORT  Recommendations-PTGENE: REPORT     Reviewer REPORT    ANTITHROMBIN III      Result Value Ref Range   AntiThromb III Func 95  75 - 120 %  BETA-2-GLYCOPROTEIN I ABS, IGG/M/A      Result Value Ref Range   Beta-2 Glyco I IgG 0  <20 G Units   Beta-2-Glycoprotein I IgM 1  <20 M  Units   Beta-2-Glycoprotein I IgA 27 (*) <20 A Units  HOMOCYSTEINE      Result Value Ref Range   Homocysteine 11.7  4.0 - 15.4 umol/L  FACTOR 5 LEIDEN      Result Value Ref Range   Interpretation-F5LEID: REPORT     Recommendations-F5LEID: REPORT     Reviewer REPORT    CARDIOLIPIN ANTIBODIES, IGG, IGM, IGA      Result Value Ref Range   Anticardiolipin IgG 8 (*) <23 GPL U/mL   Anticardiolipin IgM 0 (*) <11 MPL U/mL   Anticardiolipin IgA 7 (*) <22 APL U/mL  URINE MICROSCOPIC-ADD ON      Result Value Ref Range   Squamous Epithelial / LPF MANY (*) RARE   WBC, UA 0-2  <3 WBC/hpf   RBC / HPF 0-2  <3 RBC/hpf   Bacteria, UA RARE  RARE  LIPID PANEL      Result Value Ref Range   Cholesterol 178  0 - 200 mg/dL   Triglycerides 454  <098 mg/dL   HDL 38 (*) >11 mg/dL   Total CHOL/HDL Ratio 4.7     VLDL 23  0 - 40 mg/dL   LDL Cholesterol 914 (*) 0 - 99 mg/dL  HEMOGLOBIN N8G      Result Value Ref Range   Hemoglobin A1C 8.6 (*) <5.7 %   Mean Plasma Glucose 200 (*) <117 mg/dL  GLUCOSE, CAPILLARY      Result Value Ref Range   Glucose-Capillary 190 (*) 70 - 99 mg/dL   Comment 1 Documented in Chart     Comment 2 Notify RN    GLUCOSE, CAPILLARY      Result Value Ref Range   Glucose-Capillary 107 (*) 70 - 99 mg/dL   Comment 1 Documented in Chart     Comment 2 Notify RN    GLUCOSE, CAPILLARY      Result Value Ref Range   Glucose-Capillary 146 (*) 70 - 99 mg/dL   Comment 1 Notify RN     Comment 2 Documented in Chart    GLUCOSE, CAPILLARY      Result Value Ref Range   Glucose-Capillary 132 (*) 70 - 99 mg/dL   Comment 1 Notify RN     Comment 2 Documented in Chart    C3 COMPLEMENT      Result Value Ref Range   C3 Complement 172  90 - 180 mg/dL  C4 COMPLEMENT      Result Value Ref Range   Complement C4, Body Fluid 42 (*) 10 - 40 mg/dL  COMPLEMENT, TOTAL      Result Value Ref Range   Compl, Total (CH50) >60 (*) 31 - 60 U/mL  SEDIMENTATION RATE      Result Value Ref Range   Sed Rate  18  0 - 22 mm/hr  ANA      Result Value Ref Range   ANA NEGATIVE  NEGATIVE  RPR      Result Value Ref Range   RPR NON REAC  NON REAC  HIV ANTIBODY (ROUTINE TESTING)      Result  Value Ref Range   HIV 1&2 Ab, 4th Generation NONREACTIVE  NONREACTIVE  GLUCOSE, CAPILLARY      Result Value Ref Range   Glucose-Capillary 119 (*) 70 - 99 mg/dL   Comment 1 Notify RN     Comment 2 Documented in Chart    GLUCOSE, CAPILLARY      Result Value Ref Range   Glucose-Capillary 166 (*) 70 - 99 mg/dL   Comment 1 Notify RN     Comment 2 Documented in Chart    GLUCOSE, CAPILLARY      Result Value Ref Range   Glucose-Capillary 149 (*) 70 - 99 mg/dL   Comment 1 Documented in Chart     Comment 2 Notify RN    GLUCOSE, CAPILLARY      Result Value Ref Range   Glucose-Capillary 183 (*) 70 - 99 mg/dL  I-STAT TROPOININ, ED      Result Value Ref Range   Troponin i, poc 0.00  0.00 - 0.08 ng/mL   Comment 3             The neurologically relevant items on the patient's problem list were reviewed on today's visit.  Neurologic Examination  A problem focused neurological exam (12 or more points of the single system neurologic examination, vital signs counts as 1 point, cranial nerves count for 8 points) was performed.  Blood pressure 146/93, pulse 76, height  (1.651 m), weight 235 lb (106.595 kg).  General - obese, well developed, in no apparent distress.  Ophthalmologic - Sharp disc margins OU.  Cardiovascular - Regular rate and rhythm with no murmur.  Mental Status -  Level of arousal and orientation to time, place, and person were intact. Language including expression, naming, repetition, comprehension, reading, and writing was assessed and found intact.  Cranial Nerves II - XII - II - Visual field intact OU. III, IV, VI - Extraocular movements intact. V - Facial sensation intact bilaterally. VII - Facial movement intact bilaterally. VIII - Hearing & vestibular intact bilaterally. X -  Palate elevates symmetrically. XI - Chin turning & shoulder shrug intact bilaterally. XII - Tongue protrusion intact.  Motor Strength - The patient's strength was normal in all extremities except right UE distal 5-/5, with decreased ight hand dexterity and right pronator drift.  Bulk was normal and fasciculations were absent.   Motor Tone - Muscle tone was assessed at the neck and appendages and was normal.  Reflexes - The patient's reflexes were normal extremities and she had no pathological reflexes.  Sensory - Light touch, temperature/pinprick and Romberg testing were assessed and were normal.    Coordination - The patient had normal movements in the hands and feet with no ataxia or dysmetria.  Tremor was absent.  Gait and Station - The patient's transfers, posture, gait, station, and turns were observed as normal.  Data reviewed: I personally reviewed the images and agree with the radiology interpretations.  Mr Shirlee Latch Wo Contrast  05/22/2014  Multiple acute infarctions in the left hemisphere that could be a combination of watershed infarctions and micro embolic infarctions.  MRA  05/22/2014  Advanced intracranial disease with very limited flow and the anterior cerebral artery territories and multiple middle cerebral stenoses, particularly severe and notable at the M1 segment on the left with the vessel shows 90% stenosis. Distal branch vessels appear reduced in number an show irregularity. The findings could be due to widespread advanced atherosclerotic disease or vasculitis. This represents a marked worsening of disease  since 2009. Old infarctions affecting the thalami, hemispheric white matter, genu of the corpus callosum and right parietal cortex at the vertex.  Dg Chest Port 1 View  05/22/2014  There is no active cardiopulmonary disease.  Carotid Doppler Preliminary report: 1-39% ICA stenosis. Vertebral artery flow is antegrade.-  2D Echocardiogram Left ventricle: The cavity size  was at the upper limits ofnormal. Systolic function was normal.  The estimated ejection fraction was in the range of 55% to 60%. Wall motion was normal; there were no regional wall motion abnormalities. - Atrial septum: There is no color Doppler e/o residual inter atrial shunting. Flow present within the right atrial disk without crossing the septum (normal).  An Amplatzer closure device in the fossa ovalis region was present.  There was no atrial level shunt. No evidence of thrombus.  LDL 117 and A1C 8.6  Assessment: As you may recall, she is a 48 y.o. African American female with PMH of obesity, hypertension, diabetes, hyperlipidemia, previous embolic stroke in 2009 presumably due to PFO/ASD state post of closure by Dr. Jacinto Halim was admitted one month ago for new left MCA territory multiple small strokes. She had cardioembolic stroke in 2009 and it attributed to PFO at the time. Although the stroke confined in the left MCA territory this time, it is still suspicious for embolic stroke. She does have intracranial stenosis more prominent at left MCA and bilateral ACA which has in the significant progress from her CTA head and neck in 2009. Do to the appearance, about onset of moyamoya is in the differentials. She does have multiple stroke risk factors including hypertension, hyperlipidemia, uncontrolled diabetes, which further complicate the picture. Will continue the aspirin and Plavix total 3 months. But also will do further workup to finalize the diagnosis. Will repeat CTA head neck, he fell into PFO with TCD bubble study, finish off the vasculitis workup.  Plan:  - CTA head and neck - TCD with bubble study - Venous Doppler 4 extremities - Vasculitis workup - Continue aspirin Plavix for another 2 months and then monotherapy with Plavix - Followup with PCP for stroke risk factor modification - RTC in 2 months  Orders Placed This Encounter  Procedures  . CT Angio Neck W/Cm &/Or Wo/Cm     Standing Status: Future     Number of Occurrences:      Standing Expiration Date: 08/18/2015    Order Specific Question:  Reason for Exam (SYMPTOM  OR DIAGNOSIS REQUIRED)    Answer:  intracranial stenosis    Order Specific Question:  Is the patient pregnant?    Answer:  No    Order Specific Question:  Preferred imaging location?    Answer:  Internal  . CT Angio Head W/Cm &/Or Wo Cm    Standing Status: Future     Number of Occurrences:      Standing Expiration Date: 08/18/2015    Order Specific Question:  Reason for Exam (SYMPTOM  OR DIAGNOSIS REQUIRED)    Answer:  intracranial stenosis    Order Specific Question:  Is the patient pregnant?    Answer:  No    Order Specific Question:  Preferred imaging location?    Answer:  Internal  . [place] TRANSCRANIAL DOPPLER WITH BUBBLES    Standing Status: Future     Number of Occurrences:      Standing Expiration Date: 12/18/2014    Order Specific Question:  Reason for Exam (SYMPTOM  OR DIAGNOSIS REQUIRED)    Answer:  stroke with  PFO    Order Specific Question:  Preferred imaging location?    Answer:  Internal  . Sickle cell screen  . Systemic Lupus Profile A  . Antineutrophil Cytoplasmic Ab  . Lower Extremity Venous Duplex Bilateral    Standing Status: Future     Number of Occurrences:      Standing Expiration Date: 06/18/2015    Order Specific Question:  Laterality    Answer:  Bilateral    Order Specific Question:  Where should this test be performed:    Answer:  [facility]  . Upper extremity Venous Duplex Bilateral    Standing Status: Future     Number of Occurrences:      Standing Expiration Date: 06/18/2015    Order Specific Question:  Laterality    Answer:  Bilateral    Order Specific Question:  Where should this test be performed:    Answer:  [facility]    Patient Instructions  - schedule CT angio head and neck with [facility] - transcrianl doppler to evaluate for PFO - also schedule for rule out DVT in arms and  legs - blood draw today to rule out conditions that may prone to have clot. - continue the ASA and plavix for another 2 month and then plavix alone - follow up with PCP closely for stoke risk factor control. - follow up in 2 months    Marvel Plan, MD PhD [facility] [address] [place], [place] [address] [phone]

## 2014-06-18 LAB — SYSTEMIC LUPUS PROFILE A
ENA RNP Ab: <0.2 AI (ref 0.0–0.9)
ENA SM Ab Ser-aCnc: <0.2 AI (ref 0.0–0.9)
ENA SSA (RO) Ab: 0.8 AI (ref 0.0–0.9)
ENA SSB (LA) Ab: 0.2 AI (ref 0.0–0.9)
Rhuematoid fact SerPl-aCnc: 8.3 IU/mL (ref 0.0–13.9)

## 2014-06-18 LAB — ANTINEUTROPHIL CYTOPLASMIC AB
Atypical pANCA: <1:20 {titer}
C-ANCA: <1:20 {titer}
P-ANCA: <1:20 {titer}

## 2014-06-18 LAB — SICKLE CELL SCREEN: SICKLE CELL PREP: NEGATIVE

## 2014-06-19 NOTE — Progress Notes (Signed)
Quick Note:  Called left message blood works so far negative. ______

## 2014-06-30 ENCOUNTER — Other Ambulatory Visit: Payer: BC Managed Care – PPO

## 2014-06-30 NOTE — Progress Notes (Signed)
Quick Note:  I called pt and relayed that all her lab tests that were done were negative. Keep RV appt in Novemeber 5, 2015 with Dr. Roda Shutters. She verbalized understanding. Asking about [place] IMG tests. Will send message to Kendrick Fries in [place]. ______

## 2014-07-03 ENCOUNTER — Ambulatory Visit
Admission: RE | Admit: 2014-07-03 | Discharge: 2014-07-03 | Disposition: A | Discharge status: Home / Self Care | Payer: BC Managed Care – PPO | Source: Ambulatory Visit | Attending: Neurology | Admitting: Neurology

## 2014-07-03 DIAGNOSIS — I639 Cerebral infarction, unspecified: Secondary | ICD-10-CM

## 2014-07-03 MED ORDER — IOHEXOL 350 MG/ML SOLN
80.0000 mL | Freq: Once | INTRAVENOUS | Status: AC | PRN
  Administered 2014-07-03: 80 mL via INTRAVENOUS

## 2014-07-06 ENCOUNTER — Ambulatory Visit (INDEPENDENT_AMBULATORY_CARE_PROVIDER_SITE_OTHER): Payer: BC Managed Care – PPO | Admitting: Neurology

## 2014-07-06 ENCOUNTER — Ambulatory Visit (INDEPENDENT_AMBULATORY_CARE_PROVIDER_SITE_OTHER): Payer: BC Managed Care – PPO

## 2014-07-06 ENCOUNTER — Ambulatory Visit (INDEPENDENT_AMBULATORY_CARE_PROVIDER_SITE_OTHER): Payer: Self-pay

## 2014-07-06 DIAGNOSIS — Z0289 Encounter for other administrative examinations: Secondary | ICD-10-CM

## 2014-07-06 DIAGNOSIS — I639 Cerebral infarction, unspecified: Secondary | ICD-10-CM

## 2014-07-06 DIAGNOSIS — I6350 Cerebral infarction due to unspecified occlusion or stenosis of unspecified cerebral artery: Secondary | ICD-10-CM

## 2014-07-06 NOTE — Progress Notes (Signed)
  [facility] [address] [place]. [place] [address]. [phone]       TRANSCRANIAL DOPPLER BUBBLE STUDY   [name] Date of Birth:  1966-08-24 Medical Record Number:  [id number]   Indications: Diagnostic Date of Procedure: 07/06/2014 Clinical History:   48 year old lady with strokes in 2009 and 2015 with history of PFO status postendovascularclosure Technical Description:   Transcranial Doppler Bubble Study was performed at the bedside after taking written informed consent from the patient and explaining risk/benefits. Both middle cerebral arteries could not be insonated using a headset due to poor bitemporal windows and procedure was done insonating left vertebral artery with a hand held probe. And IV line was inserted in the left elbow by the RN using aseptic precautions. Agitated saline injection at rest and after valsalva maneuver did  result in several high intensity transient signals (HITS).   Impression:  Positive  Transcranial Doppler Bubble Study indicative of small residual right to left intracardiac shunt despite endovascular PFO closure.   Results were explained to the patient. The clinical significance of this finding is uncertain and it is unlikely to explain the patient's stroke but is more likely related to her advanced intracranial atherosclerosis Questions were answered.

## 2014-07-07 ENCOUNTER — Other Ambulatory Visit: Payer: Self-pay

## 2014-07-07 DIAGNOSIS — Z1231 Encounter for screening mammogram for malignant neoplasm of breast: Secondary | ICD-10-CM

## 2014-07-14 ENCOUNTER — Ambulatory Visit
Admission: RE | Admit: 2014-07-14 | Discharge: 2014-07-14 | Disposition: A | Discharge status: Home / Self Care | Payer: BC Managed Care – PPO | Source: Ambulatory Visit

## 2014-07-14 DIAGNOSIS — Z1231 Encounter for screening mammogram for malignant neoplasm of breast: Secondary | ICD-10-CM

## 2014-08-27 ENCOUNTER — Encounter: Payer: Self-pay | Admitting: Neurology

## 2014-08-27 ENCOUNTER — Ambulatory Visit (INDEPENDENT_AMBULATORY_CARE_PROVIDER_SITE_OTHER): Payer: BC Managed Care – PPO | Admitting: Neurology

## 2014-08-27 VITALS — BP 115/80 | HR 86 | Ht 65.5 in | Wt 231.8 lb

## 2014-08-27 DIAGNOSIS — I639 Cerebral infarction, unspecified: Secondary | ICD-10-CM

## 2014-08-27 DIAGNOSIS — M5481 Occipital neuralgia: Secondary | ICD-10-CM

## 2014-08-27 DIAGNOSIS — Q211 Atrial septal defect: Secondary | ICD-10-CM

## 2014-08-27 DIAGNOSIS — E785 Hyperlipidemia, unspecified: Secondary | ICD-10-CM

## 2014-08-27 DIAGNOSIS — I10 Essential (primary) hypertension: Secondary | ICD-10-CM

## 2014-08-27 DIAGNOSIS — E118 Type 2 diabetes mellitus with unspecified complications: Secondary | ICD-10-CM | POA: Insufficient documentation

## 2014-08-27 DIAGNOSIS — Z8774 Personal history of (corrected) congenital malformations of heart and circulatory system: Secondary | ICD-10-CM

## 2014-08-27 DIAGNOSIS — Q2112 Patent foramen ovale: Secondary | ICD-10-CM

## 2014-08-27 DIAGNOSIS — Z9889 Other specified postprocedural states: Secondary | ICD-10-CM

## 2014-08-27 MED ORDER — ASPIRIN EC 325 MG PO TBEC: 325.0000 mg | DELAYED_RELEASE_TABLET | Freq: Every day | ORAL | Status: DC

## 2014-08-27 MED ORDER — AMITRIPTYLINE HCL 25 MG PO TABS: ORAL_TABLET | ORAL | Status: DC

## 2014-08-27 NOTE — Progress Notes (Signed)
STROKE NEUROLOGY FOLLOW UP NOTE  NAME: Patricia [name] DOB: [birth year]  REASON FOR VISIT: stroke follow up HISTORY FROM: pt and chart  Today we had the pleasure of seeing Patricia [name] in follow-up at our Neurology Clinic. Pt was accompanied by daughter and granddaughter.   History Summary 48 y.o. African American female with PMH of obesity, hypertension, diabetes, hyperlipidemia, previous embolic stroke in 2009 presumably due to PFO/ASD state post of closure by Patricia [name] was admitted on 05/22/14 for right arm and leg numbness weakness, difficulty with speech. MRI had showed left MCA territory small multiple infarcts. MRA head showed significant intracranial stenosis. She was discharged on aspirin and Plavix dural antiplatelet therapy and outpatient PT/OT.  She had a previous CVA in 2009, MRI showed multiple bilateral small infarcts consistent with cardioembolic stroke. She was found by PFO/ASD, state post of a PFO closure with Patricia [name]. She stated she was treated with aspirin and Plavix for some time and then Patricia [name] took her off Plavix. She had no residual deficit left from previous stroke.  She had multiple stroke risk factors including hypertension, hyperlipidemia, diabetes, obesity. She stated that she was not a compliant with medication in the past and he now watch for her diet.  She denies any cigarette smoking, alcohol drinking, or illicit drugs.  06/17/14 follow up -  the patient has been doing well. She still not able to write with right hand, and she is getting PT/OT 2/wk. Her BP at home was OK, today in clinic 146/93. Her BP has been low lately so Patricia [name] decreased her bystolic. She saw her PCP early this month and changed her from plavix to crestor due to questionable rashes. Continue her on PO meds for DM and Sugar this morning 117.   Interval History During the interval time, she was doing well. Her BP today in clinic was 115/80 and she stated that her sugar  level at home was 113, 190, 130 for the last 3 days. Her CTA head and neck was stable comparing with prior, still showing left MCA M1 high grade stenosis as well as bilateral ACA stenosis with diffuse intracranial atherosclerosis. Vasculitis work up negative. TCD bubble study consistent with PFO s/p closure. She has allergic reaction to plavix, so PCP put her on ASA 81mg  bid and cilostazol for dural antiplatelet.  However, she complained in clinic today about her headache. She had headache for the last 30 years, the pain are at the back of her head at skull base line, with pain at the back of right eye. 2-3 per month, usually lasting for 2 days. 10/10, phonophobia but no photophobia, nausea but no vomiting. No neck pain. However, for the last 3 weeks, she has similar headache not go away. She takes percocet and phernagen but not effective.  REVIEW OF SYSTEMS: Full 14 system review of systems performed and notable only for those listed below and in HPI above, all others are negative:   Rash, headache, depression  The following represents the patient's updated allergies and side effects list: Allergies  Allergen Reactions  . Imitrex [Sumatriptan] Anaphylaxis    Labs since last visit of relevance include the following: Results for orders placed or performed in visit on 06/17/14  Sickle cell screen  Result Value Ref Range   Sickle Cell Prep Negative Negative  Systemic Lupus Profile A  Result Value Ref Range   ENA RNP Ab <0.2 0.0 - 0.9 AI   ENA SM Ab Ser-aCnc <0.2  0.0 - 0.9 AI   Rhuematoid fact SerPl-aCnc 8.3 0.0 - 13.9 IU/mL   Chromatin Ab SerPl-aCnc <0.2 0.0 - 0.9 AI   ENA SSA (RO) Ab 0.8 0.0 - 0.9 AI   ENA SSB (LA) Ab 0.2 0.0 - 0.9 AI   dsDNA Ab <1 0 - 9 IU/mL  Antineutrophil Cytoplasmic Ab  Result Value Ref Range   C-ANCA <1:20 Neg:<1:20 titer   P-ANCA <1:20 Neg:<1:20 titer   Atypical pANCA <1:20 Neg:<1:20 titer    The neurologically relevant items on the patient's problem list were  reviewed on today's visit.  Neurologic Examination  A problem focused neurological exam (12 or more points of the single system neurologic examination, vital signs counts as 1 point, cranial nerves count for 8 points) was performed.  Blood pressure 115/80, pulse 86, height 5' 5.5" (1.664 m), weight 231 lb 12.8 oz (105.144 kg).  General - obese, well developed, in no apparent distress.  Ophthalmologic - Sharp disc margins OU.  Cardiovascular - Regular rate and rhythm with no murmur.  Neck - occipital never tenderness on touch bilaterally.  Mental Status -  Level of arousal and orientation to time, place, and person were intact. Language including expression, naming, repetition, comprehension, reading, and writing was assessed and found intact.  Cranial Nerves II - XII - II - Visual field intact OU. III, IV, VI - Extraocular movements intact. V - Facial sensation intact bilaterally. VII - Facial movement intact bilaterally. VIII - Hearing & vestibular intact bilaterally. X - Palate elevates symmetrically. XI - Chin turning & shoulder shrug intact bilaterally. XII - Tongue protrusion intact.  Motor Strength - The patient's strength was normal in all extremities except right UE distal 5-/5, with decreased ight hand dexterity and right pronator drift.  Bulk was normal and fasciculations were absent.   Motor Tone - Muscle tone was assessed at the neck and appendages and was normal.  Reflexes - The patient's reflexes were normal extremities and she had no pathological reflexes.  Sensory - Light touch, temperature/pinprick and Romberg testing were assessed and were normal.    Coordination - The patient had normal movements in the hands and feet with no ataxia or dysmetria.  Tremor was absent.  Gait and Station - The patient's transfers, posture, gait, station, and turns were observed as normal.  Data reviewed: I personally reviewed the images and agree with the radiology  interpretations.  Mr Patricia [name] Wo Contrast  05/22/2014  Multiple acute infarctions in the left hemisphere that could be a combination of watershed infarctions and micro embolic infarctions.   MRA  05/22/2014  Advanced intracranial disease with very limited flow and the anterior cerebral artery territories and multiple middle cerebral stenoses, particularly severe and notable at the M1 segment on the left with the vessel shows 90% stenosis. Distal branch vessels appear reduced in number an show irregularity. The findings could be due to widespread advanced atherosclerotic disease or vasculitis. This represents a marked worsening of disease since 2009. Old infarctions affecting the thalami, hemispheric white matter, genu of the corpus callosum and right parietal cortex at the vertex.   Dg Chest Port 1 View  05/22/2014  There is no active cardiopulmonary disease.   Carotid Doppler Preliminary report: 1-39% ICA stenosis. Vertebral artery flow is antegrade.  2D Echocardiogram Left ventricle: The cavity size was at the upper limits ofnormal. Systolic function was normal.  The estimated ejection fraction was in the range of 55% to 60%. Wall motion was normal; there were  no regional wall motion abnormalities. - Atrial septum: There is no color Doppler e/o residual inter atrial shunting. Flow present within the right atrial disk without crossing the septum (normal).  An Amplatzer closure device in the fossa ovalis region was present.  There was no atrial level shunt. No evidence of thrombus.  CTA head and neck 07/03/14 1. Severe proximal left MCA stenosis. Mild proximal right MCA stenosis. Bilateral MCA branch vessel irregularity and attenuation. Occlusion of the anterior cerebral arteries at their origins with some reconstituted but severely diminished flow distally. Mild left P2 stenosis. Considerations again include advanced atherosclerosis and vasculitis. 2. No evidence of cervical carotid or  vertebral artery stenosis.  TCD bubble study 07/06/14 Both middle cerebral arteries could not be insonated using a headset due to poor bitemporal windows and procedure was done insonating left vertebral artery with a hand held probe. And IV line was inserted in the left elbow by the RN using aseptic precautions. Agitated saline injection at rest and after valsalva maneuver did result in several high intensity transient signals (HITS).  Impression: Positive Transcranial Doppler Bubble Study indicative of small residual right to left intracardiac shunt despite endovascular PFO closure.   Component     Latest Ref Rng 05/22/2014 05/23/2014 06/17/2014  PTT Lupus Anticoagulant     28.0 - 43.0 secs 32.0    PTTLA Confirmation     <8.0 secs NOT APPL    PTTLA 4:1 Mix     28.0 - 43.0 secs NOT APPL    DRVVT     <42.9 secs 36.1    Drvvt confirmation     <1.15 Ratio NOT APPL    dRVVT Incubated 1:1 Mix     <42.9 secs NOT APPL    Lupus Anticoagulant     NOT DETECTED NOT DETECTED    ENA RNP Ab     0.0 - 0.9 AI   <0.2  ENA SM Ab Ser-aCnc     0.0 - 0.9 AI   <0.2  Rhuematoid fact SerPl-aCnc     0.0 - 13.9 IU/mL   8.3  Chromatin Ab SerPl-aCnc     0.0 - 0.9 AI   <0.2  ENA SSA (RO) Ab     0.0 - 0.9 AI   0.8  ENA SSB (LA) Ab     0.0 - 0.9 AI   0.2  dsDNA Ab     0 - 9 IU/mL   <1  Cholesterol     0 - 200 mg/dL  657   Triglycerides     <150 mg/dL  846   HDL     >96 mg/dL  38 (L)   Total CHOL/HDL Ratio       4.7   VLDL     0 - 40 mg/dL  23   LDL (calc)     0 - 99 mg/dL  295 (H)   Interpretation-PTGENE:      REPORT    Recommendations-PTGENE:      REPORT    Reviewer      REPORT    Beta-2 Glyco I IgG     <20 G Units 0    Beta-2-Glycoprotein I IgM     <20 M Units 1    Beta-2-Glycoprotein I IgA     <20 A Units 27 (H)    Interpretation-F5LEID:      REPORT    Recommendations-F5LEID:      REPORT    Reviewer      REPORT  Anticardiolipin IgG     <23 GPL U/mL 8 (L)    Anticardiolipin  IgM     <11 MPL U/mL 0 (L)    Anticardiolipin IgA     <22 APL U/mL 7 (L)    C-ANCA     Neg:<1:20 titer   <1:20  P-ANCA     Neg:<1:20 titer   <1:20  Atypical pANCA     Neg:<1:20 titer   <1:20  Hgb A1c MFr Bld     <5.7 %  8.6 (H)   Mean Plasma Glucose     <117 mg/dL  540 (H)   Protein C Activity     75 - 133 % 154 (H)    Protein C, Total     72 - 160 % 135    Protein S Activity     69 - 129 % 51 (L)    Protein S Ag, Total     60 - 150 % 64    AntiThromb III Func     75 - 120 % 95    Homocysteine     4.0 - 15.4 umol/L 11.7    C3 Complement     90 - 180 mg/dL  981   Complement C4, Body Fluid     10 - 40 mg/dL  42 (H)   Compl, Total (CH50)     31 - 60 U/mL  >60 (H)   Sed Rate     0 - 22 mm/hr  18   ANA Ser Ql     NEGATIVE  NEGATIVE   RPR     NON REAC  NON REAC   HIV     NONREACTIVE  NONREACTIVE   Sickle Cell Prep     Negative   Negative    Assessment: As you may recall, she is a 48 y.o. AA  female with PMH of obesity, hypertension, diabetes, hyperlipidemia, previous embolic stroke in 2009 presumably due to PFO/ASD state post of closure by Dr. Jacinto Halim was admitted one month ago for new left MCA territory multiple small strokes. She had cardioembolic stroke in 2009 and it attributed to PFO at the time. The current stroke was confined in the left MCA territory. She does have intracranial stenosis more prominent at left MCA M1 and bilateral ACA which has in the significant progress from her CTA head and neck in 2009. She does have multiple stroke risk factors including hypertension, hyperlipidemia, uncontrolled diabetes. Repeat CTA no change from prior. TCD bubble study consistent with PFO s/p closure. Vasculitis and hypercoagulable work up negative. Finished dural antiplatelet, and will keep her on ASA 325mg  as she has allergic reaction to plavix.  Plan:  - d/c cilostazol - continue ASA 325mg  and crestor for stroke prevntion - occipital nerve block for occipital neuralgia -  amitriptyline for HA prophylaxis and for depression - d/c percocet for HA treatment - check BP and glucose at home - Follow up with your primary care physician for stroke risk factor modification. Recommend maintain blood pressure goal <130/80, diabetes with hemoglobin A1c goal below 6.5% and lipids with LDL cholesterol goal below 70 mg/dL.  - RTC in 3 months.  Meds ordered this encounter  Medications  .           .           .           . aspirin EC 325 MG tablet    Sig: Take 1 tablet (325 mg total) by mouth  daily.    Dispense:  30 tablet    Refill:  0  . amitriptyline (ELAVIL) 25 MG tablet    Sig: 25mg  Qhs for a week and then 50mg  Qhs after    Dispense:  60 tablet    Refill:  3     Patient Instructions  - discontinue cilostazol - switch ASA 81mg  twice a day to ASA 325mg  once a day for stroke prevention - discontinue percocet which can cause rebound headache - amitriptyline for headache prevention - will do occipital nerve block since your headache is due to occipital neuralgia - continue crestor for HLD and stroke prevention - check BP and glucose at home - Follow up with your primary care physician for stroke risk factor modification. Recommend maintain blood pressure goal <130/80, diabetes with hemoglobin A1c goal below 6.5% and lipids with LDL cholesterol goal below 70 mg/dL.  - follow up in 3 months.    Marvel PlanJindong Lashara Urey, MD PhD [facility] [address], [address] [place], [place] [address] [phone]

## 2014-08-27 NOTE — Patient Instructions (Signed)
-   discontinue cilostazol - switch ASA 81mg  twice a day to ASA 325mg  once a day for stroke prevention - discontinue percocet which can cause rebound headache - amitriptyline for headache prevention - will do occipital nerve block since your headache is due to occipital neuralgia - continue crestor for HLD and stroke prevention - check BP and glucose at home - Follow up with your primary care physician for stroke risk factor modification. Recommend maintain blood pressure goal <130/80, diabetes with hemoglobin A1c goal below 6.5% and lipids with LDL cholesterol goal below 70 mg/dL.  - follow up in 3 months.

## 2014-08-27 NOTE — Progress Notes (Signed)
Occiptial nerve block procedure note  Procedure Date:  08/27/14 Procedure: Occipital Nerve Block, bi  Pre-procedure Diagnosis: Headache  Post-procedure Diagnosis: same as above  Attending Staff:  Marvel PlanJindong Breda Bond, MD PhD Skin Prep: Cleansed with alcohol.  Anesthesia: 4.5 ml Lidocaine 2% without epinephrine without added sodium bicarbonate, depot-Medrol (40mg ) Indications: The identity of the patient was confirmed and a bedside time out was performed.  Description of Procedure: Pt's skin was prepped with alcohol and lidocaine 2% and depot-Medrol 40mg  was injected over the occipital ridge in a fan-like fashion with appropriate procedure bilaterally. Pt had immediate relief.  Findings: good HA relief  Complications: The patient tolerated the procedure well with no complications.  Specimens:none  Estimated blood loss: zero

## 2014-09-08 ENCOUNTER — Other Ambulatory Visit: Payer: Self-pay | Admitting: Neurology

## 2014-09-08 NOTE — Progress Notes (Signed)
Lab result 09/07/14  Na 140, K 4.5, Cl 105, CO2 28, Glu 135, BUN 18, Cre 1.23, Bilirubin total 0.4, AKP 118, AST 41, ALT 57, total protien 7.6, albumin 3.8, calcium 10.0.  Chol 101, TG 64, HDL 42, LD 50, A1C 8.2

## 2014-12-01 ENCOUNTER — Ambulatory Visit: Payer: BC Managed Care – PPO | Admitting: Neurology

## 2014-12-02 ENCOUNTER — Encounter: Payer: Self-pay | Admitting: Neurology

## 2014-12-25 ENCOUNTER — Other Ambulatory Visit: Payer: Self-pay | Admitting: Orthopedic Surgery

## 2014-12-25 DIAGNOSIS — M25511 Pain in right shoulder: Secondary | ICD-10-CM

## 2015-01-19 ENCOUNTER — Ambulatory Visit
Admission: RE | Admit: 2015-01-19 | Discharge: 2015-01-19 | Disposition: A | Discharge status: Home / Self Care | Payer: BLUE CROSS/BLUE SHIELD | Source: Ambulatory Visit | Attending: Orthopedic Surgery | Admitting: Orthopedic Surgery

## 2015-01-19 DIAGNOSIS — M25511 Pain in right shoulder: Secondary | ICD-10-CM

## 2015-01-19 MED ORDER — IOHEXOL 180 MG/ML  SOLN
12.0000 mL | Freq: Once | INTRAMUSCULAR | Status: AC | PRN
  Administered 2015-01-19: 12 mL via INTRA_ARTICULAR

## 2015-01-28 ENCOUNTER — Other Ambulatory Visit (HOSPITAL_COMMUNITY): Payer: Self-pay | Admitting: Orthopedic Surgery

## 2015-02-01 ENCOUNTER — Encounter (HOSPITAL_COMMUNITY): Payer: Self-pay | Admitting: *Deleted

## 2015-02-01 DIAGNOSIS — M25512 Pain in left shoulder: Secondary | ICD-10-CM | POA: Diagnosis present

## 2015-02-01 DIAGNOSIS — Z888 Allergy status to other drugs, medicaments and biological substances status: Secondary | ICD-10-CM | POA: Diagnosis not present

## 2015-02-01 DIAGNOSIS — I10 Essential (primary) hypertension: Secondary | ICD-10-CM | POA: Diagnosis not present

## 2015-02-01 DIAGNOSIS — M75101 Unspecified rotator cuff tear or rupture of right shoulder, not specified as traumatic: Secondary | ICD-10-CM | POA: Diagnosis not present

## 2015-02-01 DIAGNOSIS — M7521 Bicipital tendinitis, right shoulder: Secondary | ICD-10-CM | POA: Diagnosis not present

## 2015-02-01 DIAGNOSIS — E119 Type 2 diabetes mellitus without complications: Secondary | ICD-10-CM | POA: Diagnosis not present

## 2015-02-01 DIAGNOSIS — G43909 Migraine, unspecified, not intractable, without status migrainosus: Secondary | ICD-10-CM | POA: Diagnosis not present

## 2015-02-01 DIAGNOSIS — Z90710 Acquired absence of both cervix and uterus: Secondary | ICD-10-CM | POA: Diagnosis not present

## 2015-02-01 DIAGNOSIS — M7551 Bursitis of right shoulder: Secondary | ICD-10-CM | POA: Diagnosis not present

## 2015-02-01 DIAGNOSIS — J45909 Unspecified asthma, uncomplicated: Secondary | ICD-10-CM | POA: Diagnosis not present

## 2015-02-01 DIAGNOSIS — Z8673 Personal history of transient ischemic attack (TIA), and cerebral infarction without residual deficits: Secondary | ICD-10-CM | POA: Diagnosis not present

## 2015-02-01 NOTE — Progress Notes (Signed)
Anesthesia Chart Review:  Pt is 49 year old female scheduled for R shoulder arthroscopy with subacromial decompression and open rotator cuff repair on 02/02/2015 with Dr. August Saucerean.   Pt is same day work up.   Cardiologist is Dr. Jacinto HalimGanji. Last saw him 09/03/2014.   PMH includes: stroke x2 (2009, 04/2014), HTN, DM, asthma. Never smoker. BMI 38. S/p PFO/ASD closure.   Echo 05/23/2014: - Left ventricle: The cavity size was at the upper limits of normal. Systolic function was normal. The estimated ejection fraction was in the range of 55% to 60%. Wall motion was normal; there were no regional wall motion abnormalities. - Atrial septum: There is no color Doppler e/o residual inter atrial shunting. Flow present within the right atrial disk without crossing the septum (normal). An Amplatzer closure device in the fossa ovalis region was present. There was no atrial level shunt. No evidence of thrombus.  Carotid doppler US 05/23/2014: -Bilateral intimal wall thickening CCA. 1-39% B ICA stenosis. Vertebral artery flow is antegrade.   Last seen by neurology 08/27/2014. Dr. Roda ShuttersXu recommended ASA 325mg  daily for stroke prevention. Pt reported to PAT RN on telephone that she stopped ASA on 01/28/15.   Pt also takes dipyridamole. She also stopped this 01/28/15.   EKG 05/22/2014 (dated as 05/23/2014 in Epic): sinus rhythm, borderline T abnormalities diffuse leads.   If labs acceptable DOS, I anticipate pt can proceed with surgery as scheduled.   Rica Mastngela Nadean Montanaro, FNP-BC [facility]iology Phone: [phone] 02/01/2015 3:59 PM

## 2015-02-01 NOTE — Progress Notes (Signed)
Pt denies SOB and chest pain but stated that she is under the care of Dr. Jacinto HalimGanji, cardiology. Pt stated that her last dose of Dipyridamole and Aspirin was taken on the morning of January 28, 2015 without MD's knowledge. Pt stated, "I  stopped taking them both because I had to stop them when I had my MRI." Pt advised to stop taking otc vitamins and herbal medications. Records requested from Dr. Jacinto HalimGanji and [facility] [place] [facility] ) to review cardiac history.

## 2015-02-02 DIAGNOSIS — M75101 Unspecified rotator cuff tear or rupture of right shoulder, not specified as traumatic: Secondary | ICD-10-CM | POA: Diagnosis not present

## 2015-02-03 MED ORDER — CHLORHEXIDINE GLUCONATE 4 % EX LIQD
60.0000 mL | Freq: Once | CUTANEOUS | Status: DC
  Filled 2015-02-03: qty 60

## 2015-02-03 MED ORDER — CEFAZOLIN SODIUM-DEXTROSE 2-3 GM-% IV SOLR
2.0000 g | INTRAVENOUS | Status: AC
  Administered 2015-02-04: 2 g via INTRAVENOUS
  Filled 2015-02-03: qty 50

## 2015-02-03 NOTE — H&P (Signed)
Patricia [name] is an 49 y.o. female.   Chief Complaint: Right shoulder pain HPI: Patricia [name] is a 49 year old patient with right shoulder pain has been bothering her for several years. She's had several injections without relief. She reports pain weakness and difficulty with overhead activities in the right shoulder. MRI scan does show rotator cuff tear. She is very active and also needs to be ready to return to work by June 6. She has no family history of DVT or pulmonary and was him  Past Medical History  Diagnosis Date  . Hypertension   . Diabetes mellitus without complication   . Stroke 2009    lasdt stroke July 2015  . Asthma     " a long time ago"  . Headache     migraines  . Rotator cuff tear     right  . Bursitis     Past Surgical History  Procedure Laterality Date  . Cardiac surgery  Closed hole in heart in 2009    Amplatzer Septal Occluder Ref [id number] (not listed in op notes, document scanned under MRI with written op notes  . Hysterotomy    . Breast reduction surgery    . Cardiac catheterization    . Abdominal hysterectomy      Family History  Problem Relation Age of Onset  . Hypertension Mother   . Hypertension Father   . Cancer Maternal Aunt   . Cancer Maternal Uncle    Social History:  reports that she has never smoked. She has never used smokeless tobacco. She reports that she drinks alcohol. She reports that she does not use illicit drugs.  Allergies:  Allergies  Allergen Reactions  . Imitrex [Sumatriptan] Anaphylaxis  . Plavix [Clopidogrel Bisulfate] Hives  . Lipitor [Atorvastatin] Rash    No prescriptions prior to admission    No results found for this or any previous visit (from the past 48 hour(s)). No results found.  Review of Systems  Constitutional: Negative.   HENT: Negative.   Eyes: Negative.   Respiratory: Negative.   Cardiovascular: Negative.   Gastrointestinal: Negative.   Genitourinary: Negative.   Musculoskeletal:  Positive for joint pain.  Skin: Negative.   Neurological: Negative.   Endo/Heme/Allergies: Negative.   Psychiatric/Behavioral: Negative.    There were no vitals taken for this visit. Physical Exam  Constitutional: She appears well-developed.  HENT:  Head: Normocephalic.  Eyes: Pupils are equal, round, and reactive to light.  Neck: Normal range of motion.  Cardiovascular: Normal rate.   Respiratory: Effort normal.  Neurological: She is alert.  Skin: Skin is warm.  Psychiatric: She has a normal mood and affect.    examination the right shoulder demonstrates maintained passive range of motion external rotation 15 abduction is about 50 bilaterally she has coarse grinding and crepitus and some weakness raise testing on the right-hand side versus the left impingement signs equivocal the right negative left no before meals joint tenderness right palpation right versus left negative apprehension relocation testing on the right O'Brien's testing negative on the right   Assessment/Plan Impression is right shoulder rotator cuff tear MRI scan confirms significant rotator cuff tear with some retraction. Also has somewhat unfavorable acromial anatomy. I sips tendon and anchor appear to be intact. Before meals joint also appears to be mildly degenerated but not involved. Plan is for arthroscopy subacromial decompression inspection of the labrum rotator cuff repair risk and benefits discussed with patient we will and to infection or vessel damage shoulder stiffness potential for  more surgery as well as potential for possibly not being able to return to work by the sixth plan E CPM machine for 2 weeks after surgery all questions answered  DEAN,GREGORY SCOTT 02/03/2015, 6:45 PM

## 2015-02-03 NOTE — Progress Notes (Signed)
Pt instructed to call [phone] for arrival time at 8:00 AM. Pre-op instructions reviewed again and pt reminded not to take any any diabetic medications the morning of procedure, to stop taking Aspirin, otc vitamins, NSAIDs and herbal medications. Pt verbalized understanding of all pre-op instructions.

## 2015-02-04 ENCOUNTER — Ambulatory Visit (HOSPITAL_COMMUNITY): Payer: BLUE CROSS/BLUE SHIELD | Admitting: Emergency Medicine

## 2015-02-04 ENCOUNTER — Encounter (HOSPITAL_COMMUNITY): Payer: Self-pay | Admitting: Anesthesiology

## 2015-02-04 ENCOUNTER — Encounter (HOSPITAL_COMMUNITY)
Admission: RE | Disposition: A | Discharge status: Home / Self Care | Payer: BLUE CROSS/BLUE SHIELD | Source: Ambulatory Visit | Attending: Orthopedic Surgery

## 2015-02-04 ENCOUNTER — Observation Stay (HOSPITAL_COMMUNITY)
Admission: RE | Admit: 2015-02-04 | Discharge: 2015-02-05 | Disposition: A | Discharge status: Home / Self Care | Payer: BLUE CROSS/BLUE SHIELD | Source: Ambulatory Visit | Attending: Orthopedic Surgery | Admitting: Orthopedic Surgery

## 2015-02-04 DIAGNOSIS — Z8673 Personal history of transient ischemic attack (TIA), and cerebral infarction without residual deficits: Secondary | ICD-10-CM | POA: Insufficient documentation

## 2015-02-04 DIAGNOSIS — J45909 Unspecified asthma, uncomplicated: Secondary | ICD-10-CM | POA: Insufficient documentation

## 2015-02-04 DIAGNOSIS — M7551 Bursitis of right shoulder: Secondary | ICD-10-CM | POA: Insufficient documentation

## 2015-02-04 DIAGNOSIS — M7521 Bicipital tendinitis, right shoulder: Secondary | ICD-10-CM | POA: Insufficient documentation

## 2015-02-04 DIAGNOSIS — G43909 Migraine, unspecified, not intractable, without status migrainosus: Secondary | ICD-10-CM | POA: Insufficient documentation

## 2015-02-04 DIAGNOSIS — Z888 Allergy status to other drugs, medicaments and biological substances status: Secondary | ICD-10-CM | POA: Insufficient documentation

## 2015-02-04 DIAGNOSIS — M75100 Unspecified rotator cuff tear or rupture of unspecified shoulder, not specified as traumatic: Secondary | ICD-10-CM | POA: Diagnosis present

## 2015-02-04 DIAGNOSIS — M75101 Unspecified rotator cuff tear or rupture of right shoulder, not specified as traumatic: Principal | ICD-10-CM | POA: Insufficient documentation

## 2015-02-04 DIAGNOSIS — I10 Essential (primary) hypertension: Secondary | ICD-10-CM | POA: Insufficient documentation

## 2015-02-04 DIAGNOSIS — E119 Type 2 diabetes mellitus without complications: Secondary | ICD-10-CM | POA: Insufficient documentation

## 2015-02-04 DIAGNOSIS — Z90710 Acquired absence of both cervix and uterus: Secondary | ICD-10-CM | POA: Insufficient documentation

## 2015-02-04 HISTORY — DX: Headache, unspecified: R51.9

## 2015-02-04 HISTORY — DX: Unspecified asthma, uncomplicated: J45.909

## 2015-02-04 HISTORY — DX: Bursopathy, unspecified: M71.9

## 2015-02-04 HISTORY — DX: Headache: R51

## 2015-02-04 HISTORY — DX: Unspecified rotator cuff tear or rupture of unspecified shoulder, not specified as traumatic: M75.100

## 2015-02-04 HISTORY — PX: SHOULDER ARTHROSCOPY WITH OPEN ROTATOR CUFF REPAIR AND DISTAL CLAVICLE ACROMINECTOMY: SHX5683

## 2015-02-04 LAB — GLUCOSE, CAPILLARY
Glucose-Capillary: 80 mg/dL (ref 70–99)
Glucose-Capillary: 86 mg/dL (ref 70–99)
Glucose-Capillary: 111 mg/dL — ABNORMAL HIGH (ref 70–99)

## 2015-02-04 SURGERY — SHOULDER ARTHROSCOPY WITH OPEN ROTATOR CUFF REPAIR AND DISTAL CLAVICLE ACROMINECTOMY
Anesthesia: General | Site: Shoulder | Laterality: Right

## 2015-02-04 MED ORDER — MIDAZOLAM HCL 2 MG/2ML IJ SOLN
INTRAMUSCULAR | Status: AC
  Filled 2015-02-04: qty 2

## 2015-02-04 MED ORDER — MIDAZOLAM HCL 2 MG/2ML IJ SOLN
1.0000 mg | INTRAMUSCULAR | Status: DC | PRN
  Administered 2015-02-04: 1 mg via INTRAVENOUS

## 2015-02-04 MED ORDER — CEFAZOLIN SODIUM-DEXTROSE 2-3 GM-% IV SOLR
2.0000 g | Freq: Four times a day (QID) | INTRAVENOUS | Status: AC
  Administered 2015-02-05 (×2): 2 g via INTRAVENOUS
  Filled 2015-02-04 (×2): qty 50

## 2015-02-04 MED ORDER — FENTANYL CITRATE (PF) 100 MCG/2ML IJ SOLN
INTRAMUSCULAR | Status: DC | PRN
  Administered 2015-02-04: 50 ug via INTRAVENOUS
  Administered 2015-02-04: 100 ug via INTRAVENOUS

## 2015-02-04 MED ORDER — OXYCODONE HCL 5 MG PO TABS
5.0000 mg | ORAL_TABLET | ORAL | Status: DC | PRN
  Administered 2015-02-05 (×2): 10 mg via ORAL
  Filled 2015-02-04 (×2): qty 2

## 2015-02-04 MED ORDER — SODIUM CHLORIDE 0.9 % IR SOLN
Status: DC | PRN
  Administered 2015-02-04 (×2): 3000 mL

## 2015-02-04 MED ORDER — POTASSIUM CHLORIDE IN NACL 20-0.9 MEQ/L-% IV SOLN
INTRAVENOUS | Status: AC
  Administered 2015-02-05: 02:00:00 via INTRAVENOUS
  Filled 2015-02-04: qty 1000

## 2015-02-04 MED ORDER — LACTATED RINGERS IV SOLN
INTRAVENOUS | Status: DC | PRN
  Administered 2015-02-04 (×2): via INTRAVENOUS

## 2015-02-04 MED ORDER — PHENOL 1.4 % MT LIQD: 1.0000 | OROMUCOSAL | Status: DC | PRN

## 2015-02-04 MED ORDER — OXYCODONE HCL 5 MG PO TABS: 5.0000 mg | ORAL_TABLET | Freq: Once | ORAL | Status: AC | PRN

## 2015-02-04 MED ORDER — FENTANYL CITRATE (PF) 100 MCG/2ML IJ SOLN
INTRAMUSCULAR | Status: AC
  Administered 2015-02-04: 50 ug via INTRAVENOUS
  Filled 2015-02-04: qty 2

## 2015-02-04 MED ORDER — ONDANSETRON HCL 4 MG/2ML IJ SOLN
INTRAMUSCULAR | Status: AC
  Filled 2015-02-04: qty 2

## 2015-02-04 MED ORDER — FENTANYL CITRATE (PF) 250 MCG/5ML IJ SOLN
INTRAMUSCULAR | Status: AC
  Filled 2015-02-04: qty 5

## 2015-02-04 MED ORDER — LIDOCAINE HCL (CARDIAC) 20 MG/ML IV SOLN
INTRAVENOUS | Status: DC | PRN
  Administered 2015-02-04: 80 mg via INTRAVENOUS

## 2015-02-04 MED ORDER — CHLORTHALIDONE 25 MG PO TABS
25.0000 mg | ORAL_TABLET | Freq: Every day | ORAL | Status: DC
  Filled 2015-02-04: qty 1

## 2015-02-04 MED ORDER — ACETAMINOPHEN 650 MG RE SUPP: 650.0000 mg | Freq: Four times a day (QID) | RECTAL | Status: DC | PRN

## 2015-02-04 MED ORDER — NEOSTIGMINE METHYLSULFATE 10 MG/10ML IV SOLN
INTRAVENOUS | Status: AC
  Filled 2015-02-04: qty 1

## 2015-02-04 MED ORDER — SODIUM CHLORIDE 0.9 % IJ SOLN
INTRAMUSCULAR | Status: DC | PRN
  Administered 2015-02-04: 30 mL

## 2015-02-04 MED ORDER — FENTANYL CITRATE (PF) 100 MCG/2ML IJ SOLN
INTRAMUSCULAR | Status: AC
  Filled 2015-02-04: qty 2

## 2015-02-04 MED ORDER — HYDROMORPHONE HCL 1 MG/ML IJ SOLN
INTRAMUSCULAR | Status: AC
  Filled 2015-02-04: qty 1

## 2015-02-04 MED ORDER — FENTANYL CITRATE (PF) 100 MCG/2ML IJ SOLN
100.0000 ug | Freq: Once | INTRAMUSCULAR | Status: AC
  Administered 2015-02-04: 100 ug via INTRAVENOUS

## 2015-02-04 MED ORDER — METOCLOPRAMIDE HCL 5 MG PO TABS: 5.0000 mg | ORAL_TABLET | Freq: Three times a day (TID) | ORAL | Status: DC | PRN

## 2015-02-04 MED ORDER — ASPIRIN EC 81 MG PO TBEC: 324.0000 mg | DELAYED_RELEASE_TABLET | Freq: Every day | ORAL | Status: DC

## 2015-02-04 MED ORDER — ONDANSETRON HCL 4 MG/2ML IJ SOLN
4.0000 mg | Freq: Four times a day (QID) | INTRAMUSCULAR | Status: DC | PRN
  Administered 2015-02-05: 4 mg via INTRAVENOUS
  Filled 2015-02-04: qty 2

## 2015-02-04 MED ORDER — FLUCONAZOLE 150 MG PO TABS
150.0000 mg | ORAL_TABLET | Freq: Every day | ORAL | Status: DC | PRN
Start: 2015-02-04 — End: 2015-02-05

## 2015-02-04 MED ORDER — EPHEDRINE SULFATE 50 MG/ML IJ SOLN
INTRAMUSCULAR | Status: DC | PRN
  Administered 2015-02-04: 5 mg via INTRAVENOUS

## 2015-02-04 MED ORDER — ONDANSETRON HCL 4 MG PO TABS: 4.0000 mg | ORAL_TABLET | Freq: Four times a day (QID) | ORAL | Status: DC | PRN

## 2015-02-04 MED ORDER — ONDANSETRON HCL 4 MG/2ML IJ SOLN: 4.0000 mg | Freq: Once | INTRAMUSCULAR | Status: AC | PRN

## 2015-02-04 MED ORDER — CEFAZOLIN SODIUM-DEXTROSE 2-3 GM-% IV SOLR
INTRAVENOUS | Status: AC
  Filled 2015-02-04: qty 150

## 2015-02-04 MED ORDER — FENTANYL CITRATE (PF) 100 MCG/2ML IJ SOLN
INTRAMUSCULAR | Status: AC
  Administered 2015-02-04: 100 ug via INTRAVENOUS
  Filled 2015-02-04: qty 2

## 2015-02-04 MED ORDER — OXYCODONE HCL 5 MG PO TABS
ORAL_TABLET | ORAL | Status: AC
  Administered 2015-02-04: 5 mg
  Filled 2015-02-04: qty 1

## 2015-02-04 MED ORDER — OXYCODONE HCL 5 MG/5ML PO SOLN: 5.0000 mg | Freq: Once | ORAL | Status: AC | PRN

## 2015-02-04 MED ORDER — NEBIVOLOL HCL 10 MG PO TABS
10.0000 mg | ORAL_TABLET | Freq: Every day | ORAL | Status: DC
  Filled 2015-02-04: qty 1

## 2015-02-04 MED ORDER — EPHEDRINE SULFATE 50 MG/ML IJ SOLN
INTRAMUSCULAR | Status: AC
  Filled 2015-02-04: qty 1

## 2015-02-04 MED ORDER — TIZANIDINE HCL 4 MG PO TABS
4.0000 mg | ORAL_TABLET | Freq: Three times a day (TID) | ORAL | Status: DC | PRN
  Administered 2015-02-05: 4 mg via ORAL
  Filled 2015-02-04: qty 1

## 2015-02-04 MED ORDER — MIDAZOLAM HCL 5 MG/5ML IJ SOLN
INTRAMUSCULAR | Status: DC | PRN
  Administered 2015-02-04: 2 mg via INTRAVENOUS

## 2015-02-04 MED ORDER — MORPHINE SULFATE 2 MG/ML IJ SOLN
2.0000 mg | INTRAMUSCULAR | Status: DC | PRN
  Administered 2015-02-05 (×3): 2 mg via INTRAVENOUS
  Filled 2015-02-04 (×3): qty 1

## 2015-02-04 MED ORDER — ROSUVASTATIN CALCIUM 10 MG PO TABS: 10.0000 mg | ORAL_TABLET | ORAL | Status: DC

## 2015-02-04 MED ORDER — PROMETHAZINE HCL 25 MG PO TABS: 25.0000 mg | ORAL_TABLET | Freq: Three times a day (TID) | ORAL | Status: DC | PRN

## 2015-02-04 MED ORDER — GLYCOPYRROLATE 0.2 MG/ML IJ SOLN
INTRAMUSCULAR | Status: AC
  Filled 2015-02-04: qty 2

## 2015-02-04 MED ORDER — ROCURONIUM BROMIDE 100 MG/10ML IV SOLN
INTRAVENOUS | Status: DC | PRN
  Administered 2015-02-04: 50 mg via INTRAVENOUS

## 2015-02-04 MED ORDER — DEXAMETHASONE SODIUM PHOSPHATE 4 MG/ML IJ SOLN
INTRAMUSCULAR | Status: DC | PRN
  Administered 2015-02-04: 4 mg via INTRAVENOUS

## 2015-02-04 MED ORDER — BUPIVACAINE HCL 0.25 % IJ SOLN
30.0000 mL | Freq: Once | INTRAMUSCULAR | Status: DC
  Filled 2015-02-04: qty 30

## 2015-02-04 MED ORDER — ARTIFICIAL TEARS OP OINT
TOPICAL_OINTMENT | OPHTHALMIC | Status: DC | PRN
Start: 2015-02-04 — End: 2015-02-04
  Administered 2015-02-04: 1 via OPHTHALMIC

## 2015-02-04 MED ORDER — MENTHOL 3 MG MT LOZG: 1.0000 | LOZENGE | OROMUCOSAL | Status: DC | PRN

## 2015-02-04 MED ORDER — SODIUM CHLORIDE 0.9 % IJ SOLN
INTRAMUSCULAR | Status: AC
  Filled 2015-02-04: qty 10

## 2015-02-04 MED ORDER — MIDAZOLAM HCL 2 MG/2ML IJ SOLN
INTRAMUSCULAR | Status: AC
  Administered 2015-02-04: 1 mg via INTRAVENOUS
  Filled 2015-02-04: qty 2

## 2015-02-04 MED ORDER — DIPYRIDAMOLE 75 MG PO TABS
75.0000 mg | ORAL_TABLET | Freq: Three times a day (TID) | ORAL | Status: DC
  Filled 2015-02-04 (×3): qty 1

## 2015-02-04 MED ORDER — ONDANSETRON HCL 4 MG/2ML IJ SOLN
INTRAMUSCULAR | Status: DC | PRN
  Administered 2015-02-04: 4 mg via INTRAVENOUS

## 2015-02-04 MED ORDER — GLYCOPYRROLATE 0.2 MG/ML IJ SOLN
INTRAMUSCULAR | Status: DC | PRN
  Administered 2015-02-04: 0.2 mg via INTRAVENOUS
  Administered 2015-02-04: 0.4 mg via INTRAVENOUS

## 2015-02-04 MED ORDER — FENTANYL CITRATE (PF) 100 MCG/2ML IJ SOLN
50.0000 ug | INTRAMUSCULAR | Status: AC | PRN
  Administered 2015-02-04 (×4): 50 ug via INTRAVENOUS

## 2015-02-04 MED ORDER — ACETAMINOPHEN 325 MG PO TABS: 650.0000 mg | ORAL_TABLET | Freq: Four times a day (QID) | ORAL | Status: DC | PRN

## 2015-02-04 MED ORDER — CANAGLIFLOZIN 300 MG PO TABS
300.0000 mg | ORAL_TABLET | Freq: Every day | ORAL | Status: DC
  Filled 2015-02-04: qty 300

## 2015-02-04 MED ORDER — METOCLOPRAMIDE HCL 5 MG/ML IJ SOLN: 5.0000 mg | Freq: Three times a day (TID) | INTRAMUSCULAR | Status: DC | PRN

## 2015-02-04 MED ORDER — HYDROMORPHONE HCL 1 MG/ML IJ SOLN
0.5000 mg | INTRAMUSCULAR | Status: AC | PRN
  Administered 2015-02-04 (×4): 0.5 mg via INTRAVENOUS

## 2015-02-04 MED ORDER — DEXAMETHASONE SODIUM PHOSPHATE 4 MG/ML IJ SOLN
INTRAMUSCULAR | Status: AC
  Filled 2015-02-04: qty 2

## 2015-02-04 MED ORDER — 0.9 % SODIUM CHLORIDE (POUR BTL) OPTIME
TOPICAL | Status: DC | PRN
  Administered 2015-02-04: 2000 mL

## 2015-02-04 MED ORDER — EPINEPHRINE HCL 1 MG/ML IJ SOLN
1.0000 mg | Freq: Once | INTRAMUSCULAR | Status: DC
  Filled 2015-02-04: qty 1

## 2015-02-04 MED ORDER — INSULIN DEGLUDEC 100 UNIT/ML ~~LOC~~ SOPN: 35.0000 [IU] | PEN_INJECTOR | Freq: Every evening | SUBCUTANEOUS | Status: DC | PRN

## 2015-02-04 MED ORDER — NEOSTIGMINE METHYLSULFATE 10 MG/10ML IV SOLN
INTRAVENOUS | Status: DC | PRN
  Administered 2015-02-04: 3 mg via INTRAVENOUS

## 2015-02-04 MED ORDER — PROPOFOL 10 MG/ML IV BOLUS
INTRAVENOUS | Status: DC | PRN
  Administered 2015-02-04: 200 mg via INTRAVENOUS

## 2015-02-04 MED ORDER — PROPOFOL 10 MG/ML IV BOLUS
INTRAVENOUS | Status: AC
  Filled 2015-02-04: qty 20

## 2015-02-04 MED ORDER — GLYCOPYRROLATE 0.2 MG/ML IJ SOLN
INTRAMUSCULAR | Status: AC
  Filled 2015-02-04: qty 1

## 2015-02-04 MED ORDER — SITAGLIPTIN PHOS-METFORMIN HCL 50-1000 MG PO TABS: 1.0000 | ORAL_TABLET | Freq: Every day | ORAL | Status: DC

## 2015-02-04 SURGICAL SUPPLY — 77 items
ANCHOR SUT BIO SW 4.75X19.1 (Anchor) ×3 IMPLANT
BENZOIN TINCTURE PRP APPL 2/3 (GAUZE/BANDAGES/DRESSINGS) ×3 IMPLANT
BIT DRILL TAK (DRILL) IMPLANT
BLADE CUDA 5.5 (BLADE) IMPLANT
BLADE CUTTER GATOR 3.5 (BLADE) ×3 IMPLANT
BLADE GREAT WHITE 4.2 (BLADE) ×2 IMPLANT
BLADE GREAT WHITE 4.2MM (BLADE) ×1
BLADE SURG 11 STRL SS (BLADE) ×3 IMPLANT
BUR GATOR 2.9 (BURR) IMPLANT
BUR GATOR 2.9MM (BURR)
BUR OVAL 6.0 (BURR) ×3 IMPLANT
CANNULA SHOULDER 7CM (CANNULA) IMPLANT
CARTRIDGE CURVETEK MED (MISCELLANEOUS) IMPLANT
CARTRIDGE CURVETEK XLRG (MISCELLANEOUS) IMPLANT
CLOSURE WOUND 1/2 X4 (GAUZE/BANDAGES/DRESSINGS) ×1
COVER SURGICAL LIGHT HANDLE (MISCELLANEOUS) ×3 IMPLANT
DRAPE INCISE IOBAN 66X45 STRL (DRAPES) ×6 IMPLANT
DRAPE STERI 35X30 U-POUCH (DRAPES) ×3 IMPLANT
DRAPE U-SHAPE 47X51 STRL (DRAPES) ×6 IMPLANT
DRILL TAK (DRILL)
DRSG MEPILEX BORDER 4X4 (GAUZE/BANDAGES/DRESSINGS) ×3 IMPLANT
DRSG MEPILEX BORDER 4X8 (GAUZE/BANDAGES/DRESSINGS) ×3 IMPLANT
DRSG PAD ABDOMINAL 8X10 ST (GAUZE/BANDAGES/DRESSINGS) ×9 IMPLANT
DURAPREP 26ML APPLICATOR (WOUND CARE) ×3 IMPLANT
ELECT MENISCUS 165MM 90D (ELECTRODE) IMPLANT
ELECT REM PT RETURN 9FT ADLT (ELECTROSURGICAL) ×3
ELECTRODE REM PT RTRN 9FT ADLT (ELECTROSURGICAL) ×1 IMPLANT
FILTER STRAW FLUID ASPIR (MISCELLANEOUS) IMPLANT
GAUZE SPONGE 4X4 12PLY STRL (GAUZE/BANDAGES/DRESSINGS) ×3 IMPLANT
GAUZE XEROFORM 1X8 LF (GAUZE/BANDAGES/DRESSINGS) ×3 IMPLANT
GLOVE BIO SURGEON ST LM GN SZ9 (GLOVE) IMPLANT
GLOVE BIOGEL PI IND STRL 8 (GLOVE) ×1 IMPLANT
GLOVE BIOGEL PI INDICATOR 8 (GLOVE) ×2
GLOVE SURG ORTHO 8.0 STRL STRW (GLOVE) ×3 IMPLANT
GOWN STRL REUS W/ TWL LRG LVL3 (GOWN DISPOSABLE) ×3 IMPLANT
GOWN STRL REUS W/TWL LRG LVL3 (GOWN DISPOSABLE) ×6
KIT BASIN OR (CUSTOM PROCEDURE TRAY) ×3 IMPLANT
KIT BIO-TENODESIS 3X8 DISP (MISCELLANEOUS) ×2
KIT INSRT BABSR STRL DISP BTN (MISCELLANEOUS) ×1 IMPLANT
KIT ROOM TURNOVER OR (KITS) ×3 IMPLANT
MANIFOLD NEPTUNE II (INSTRUMENTS) ×3 IMPLANT
NDL SUT 6 .5 CRC .975X.05 MAYO (NEEDLE) ×1 IMPLANT
NEEDLE HYPO 25X1 1.5 SAFETY (NEEDLE) ×3 IMPLANT
NEEDLE MAYO TAPER (NEEDLE) ×2
NEEDLE SCORPION MULTI FIRE (NEEDLE) ×3 IMPLANT
NEEDLE SPNL 18GX3.5 QUINCKE PK (NEEDLE) ×3 IMPLANT
NS IRRIG 1000ML POUR BTL (IV SOLUTION) ×3 IMPLANT
PACK SHOULDER (CUSTOM PROCEDURE TRAY) ×3 IMPLANT
PAD ARMBOARD 7.5X6 YLW CONV (MISCELLANEOUS) ×6 IMPLANT
SCREW TENODESIS BIOCOMP 7MM (Screw) ×3 IMPLANT
SET ARTHROSCOPY TUBING (MISCELLANEOUS) ×2
SET ARTHROSCOPY TUBING LN (MISCELLANEOUS) ×1 IMPLANT
SLING ARM IMMOBILIZER LRG (SOFTGOODS) ×3 IMPLANT
SLING ARM IMMOBILIZER MED (SOFTGOODS) IMPLANT
SPEAR FASTAKII (SLEEVE) IMPLANT
SPONGE LAP 4X18 X RAY DECT (DISPOSABLE) ×6 IMPLANT
STRIP CLOSURE SKIN 1/2X4 (GAUZE/BANDAGES/DRESSINGS) ×2 IMPLANT
SUCTION FRAZIER TIP 10 FR DISP (SUCTIONS) ×3 IMPLANT
SUT ETHILON 3 0 PS 1 (SUTURE) ×3 IMPLANT
SUT FIBERWIRE 2-0 18 17.9 3/8 (SUTURE) ×3
SUT PROLENE 3 0 PS 2 (SUTURE) ×3 IMPLANT
SUT TIGER TAPE 7 IN WHITE (SUTURE) ×3 IMPLANT
SUT VIC AB 0 CT1 27 (SUTURE) ×4
SUT VIC AB 0 CT1 27XBRD ANBCTR (SUTURE) ×2 IMPLANT
SUT VIC AB 1 CT1 27 (SUTURE) ×2
SUT VIC AB 1 CT1 27XBRD ANBCTR (SUTURE) ×1 IMPLANT
SUT VIC AB 2-0 CT1 27 (SUTURE) ×4
SUT VIC AB 2-0 CT1 TAPERPNT 27 (SUTURE) ×2 IMPLANT
SUT VICRYL 0 UR6 27IN ABS (SUTURE) IMPLANT
SUTURE FIBERWR 2-0 18 17.9 3/8 (SUTURE) ×1 IMPLANT
SYR 20CC LL (SYRINGE) ×6 IMPLANT
SYR 3ML LL SCALE MARK (SYRINGE) ×3 IMPLANT
SYR TB 1ML LUER SLIP (SYRINGE) ×3 IMPLANT
TOWEL OR 17X24 6PK STRL BLUE (TOWEL DISPOSABLE) ×3 IMPLANT
TOWEL OR 17X26 10 PK STRL BLUE (TOWEL DISPOSABLE) ×3 IMPLANT
WAND HAND CNTRL MULTIVAC 90 (MISCELLANEOUS) ×3 IMPLANT
WATER STERILE IRR 1000ML POUR (IV SOLUTION) IMPLANT

## 2015-02-04 NOTE — Transfer of Care (Signed)
Immediate Anesthesia Transfer of Care Note  Patient: Patricia [name]  Procedure(s) Performed: Procedure(s) with comments: SHOULDER ARTHROSCOPY WITH SUBACROMIAL DECOMPRESSION AND OPEN ROTATOR CUFF REPAIR, (Right) - RIGHT SHOULDER DOA,SAD,MINI-OPEN ROTATOR CUFF TEAR REPAIR.  Patient Location: PACU  Anesthesia Type:GA combined with regional for post-op pain  Level of Consciousness: oriented, sedated, patient cooperative and responds to stimulation  Airway & Oxygen Therapy: Patient Spontanous Breathing and Patient connected to nasal cannula oxygen  Post-op Assessment: Report given to RN, Post -op Vital signs reviewed and stable and Patient moving all extremities X 4  Post vital signs: Reviewed and stable  Last Vitals:  Filed Vitals:   02/04/15 2155  BP: 163/95  Pulse: 78  Temp: 36.5 C  Resp:     Complications: No apparent anesthesia complications

## 2015-02-04 NOTE — Brief Op Note (Signed)
02/04/2015  9:43 PM  PATIENT:  Porfirio Oarutchess D Karras  49 y.o. female  PRE-OPERATIVE DIAGNOSIS:  RIGHT SHOUDLER ROTATOR CUFF TEAR, BURSITIS  POST-OPERATIVE DIAGNOSIS:  RIGHT SHOUDLER ROTATOR CUFF TEAR, BURSITIS  PROCEDURE:  Procedure(s): SHOULDER ARTHROSCOPY WITH SUBACROMIAL DECOMPRESSION AND OPEN ROTATOR CUFF REPAIR,biceps tenodesis  SURGEON:  Surgeon(s): Cammy CopaScott Gregory Dean, MD  ASSISTANT: justin rnfa  ANESTHESIA:   general  EBL: 50 ml    Total I/O In: 1000 [I.V.:1000] Out: -   BLOOD ADMINISTERED: none  DRAINS: none   LOCAL MEDICATIONS USED:  none  SPECIMEN:  No Specimen  COUNTS:  YES  TOURNIQUET:  * No tourniquets in log *  DICTATION: .Other Dictation: Dictation Number [id number]  PLAN OF CARE: Admit for overnight observation  PATIENT DISPOSITION:  PACU - hemodynamically stable

## 2015-02-04 NOTE — Anesthesia Preprocedure Evaluation (Deleted)

## 2015-02-04 NOTE — Anesthesia Preprocedure Evaluation (Addendum)
Anesthesia Evaluation  Patient identified by MRN, date of birth, ID band  Reviewed: Allergy & Precautions, NPO status , Patient's Chart, lab work & pertinent test results  Airway Mallampati: II       Dental  (+) Teeth Intact, Dental Advisory Given   Pulmonary asthma ,  breath sounds clear to auscultation        Cardiovascular hypertension, Pt. on medications Rhythm:Regular Rate:Normal     Neuro/Psych    GI/Hepatic   Endo/Other  diabetes, Well Controlled, Type obesity  Renal/GU      Musculoskeletal   Abdominal   Peds  Hematology   Anesthesia Other Findings   Reproductive/Obstetrics                            Anesthesia Physical Anesthesia Plan  ASA: II  Anesthesia Plan: General and Regional   Post-op Pain Management:    Induction: Intravenous  Airway Management Planned: Oral ETT  Additional Equipment:   Intra-op Plan:   Post-operative Plan: Extubation in OR  Informed Consent: I have reviewed the patients History and Physical, chart, labs and discussed the procedure including the risks, benefits and alternatives for the proposed anesthesia with the patient or authorized representative who has indicated his/her understanding and acceptance.   Dental advisory given  Plan Discussed with: Anesthesiologist, Surgeon and CRNA  Anesthesia Plan Comments:        Anesthesia Quick Evaluation

## 2015-02-04 NOTE — Progress Notes (Signed)
Pt reports headache and increase in anxiety due to delay of surgery. Okay to give IV Fentanyl up to 100 mcgs per Dr. Earnestine MealingGermerouth which was done, will monitor.

## 2015-02-04 NOTE — Anesthesia Postprocedure Evaluation (Signed)
  Anesthesia Post-op Note  Patient: Patricia [name]  Procedure(s) Performed: Procedure(s) with comments: SHOULDER ARTHROSCOPY WITH SUBACROMIAL DECOMPRESSION AND OPEN ROTATOR CUFF REPAIR, (Right) - RIGHT SHOULDER DOA,SAD,MINI-OPEN ROTATOR CUFF TEAR REPAIR.  Patient Location: PACU  Anesthesia Type:GA combined with regional for post-op pain  Level of Consciousness: awake, alert  and oriented  Airway and Oxygen Therapy: Patient Spontanous Breathing and Patient connected to nasal cannula oxygen  Post-op Pain: moderate  Post-op Assessment: Post-op Vital signs reviewed  Post-op Vital Signs: Reviewed  Last Vitals:  Filed Vitals:   02/04/15 2230  BP: 165/93  Pulse: 68  Temp:   Resp:     Complications: No apparent anesthesia complications

## 2015-02-04 NOTE — Interval H&P Note (Signed)
History and Physical Interval Note:  02/04/2015 7:01 PM  Patricia [name]  has presented today for surgery, with the diagnosis of RIGHT SHOUDLER ROTATOR CUFF TEAR, BURSITIS  The various methods of treatment have been discussed with the patient and family. After consideration of risks, benefits and other options for treatment, the patient has consented to  Procedure(s) with comments: SHOULDER ARTHROSCOPY WITH SUBACROMIAL DECOMPRESSION AND OPEN ROTATOR CUFF REPAIR, (Right) - RIGHT SHOULDER DOA,SAD,MINI-OPEN ROTATOR CUFF TEAR REPAIR. as a surgical intervention .  The patient's history has been reviewed, patient examined, no change in status, stable for surgery.  I have reviewed the patient's chart and labs.  Questions were answered to the patient's satisfaction.     Rand Boller SCOTT

## 2015-02-05 DIAGNOSIS — M75101 Unspecified rotator cuff tear or rupture of right shoulder, not specified as traumatic: Secondary | ICD-10-CM | POA: Diagnosis not present

## 2015-02-05 LAB — GLUCOSE, CAPILLARY
Glucose-Capillary: 151 mg/dL — ABNORMAL HIGH (ref 70–99)
Glucose-Capillary: 181 mg/dL — ABNORMAL HIGH (ref 70–99)

## 2015-02-05 MED ORDER — OXYCODONE HCL ER 10 MG PO T12A: 10.0000 mg | EXTENDED_RELEASE_TABLET | Freq: Two times a day (BID) | ORAL | Status: DC

## 2015-02-05 MED ORDER — LINAGLIPTIN 5 MG PO TABS
5.0000 mg | ORAL_TABLET | Freq: Every day | ORAL | Status: DC
Start: 2015-02-05 — End: 2015-02-05

## 2015-02-05 MED ORDER — METFORMIN HCL 500 MG PO TABS: 1000.0000 mg | ORAL_TABLET | Freq: Every day | ORAL | Status: DC

## 2015-02-05 MED ORDER — OXYCODONE HCL ER 10 MG PO T12A
10.0000 mg | EXTENDED_RELEASE_TABLET | Freq: Two times a day (BID) | ORAL | Status: DC
  Administered 2015-02-05: 10 mg via ORAL
  Filled 2015-02-05: qty 1

## 2015-02-05 MED ORDER — OXYCODONE HCL 5 MG PO TABS: 5.0000 mg | ORAL_TABLET | ORAL | Status: DC | PRN

## 2015-02-05 NOTE — Progress Notes (Signed)
OT Cancellation Note  Patient Details Name: Patricia [name] MRN: [id number] DOB: Aug 23, 1966   Cancelled Treatment:    Reason Eval/Treat Not Completed: Pain limiting ability to participate Attempted to see this am. Pain asking for pain meds. Will return later this am for education. [facility]  Dorisann Schwanke, OTR/L  [phone] 02/05/2015 02/05/2015, 10:23 AM

## 2015-02-05 NOTE — Plan of Care (Signed)
Problem: Consults Goal: Diagnosis - Shoulder Surgery Total Shoulder Arthroplasty  Right with rotator cuff repair

## 2015-02-05 NOTE — Progress Notes (Signed)
Discharge instructions given. Pt verbalized understanding and all questions were answered.  

## 2015-02-05 NOTE — Progress Notes (Signed)
Occupational Therapy Evaluation Patient Details Name: Patricia [name] MRN: [id number] DOB: April 15, 1966 Today's Date: 02/05/2015    History of Present Illness s/p RUE RTC repair.    Clinical Impression   Completed all education regarding compensatory techniques for ADL and precautions per MD protocol Pt to follow up with MD who will progress rehab of R shoulder. Ready for D/C.    Follow Up Recommendations  Supervision/Assistance - 24 hour;Other (comment) (per MD protocol at follow appt)    Equipment Recommendations  None recommended by OT    Recommendations for Other Services       Precautions / Restrictions Precautions Precautions: Shoulder Type of Shoulder Precautions: passive protocol Shoulder Interventions: At all times;Off for dressing/bathing/exercises Precaution Booklet Issued: Yes (comment) Restrictions Weight Bearing Restrictions: Yes RUE Weight Bearing: Non weight bearing      Mobility Bed Mobility Overal bed mobility: Independent                Transfers Overall transfer level: Independent                    Balance Overall balance assessment: No apparent balance deficits (not formally assessed)                                          ADL Overall ADL's : Needs assistance/impaired                                     Functional mobility during ADLs: Independent General ADL Comments: Educated on compensatory techniques for ADL, including sling management. discussed need to keep elbow flexed during ADL due to MD orders. Pt able to return demosntrate.     Vision     Perception     Praxis      Pertinent Vitals/Pain Pain Assessment: 0-10 Pain Score: 5  Pain Location: R shoulder Pain Descriptors / Indicators: Aching;Sore Pain Intervention(s): Limited activity within patient's tolerance;Monitored during session;Repositioned;Ice applied     Hand Dominance Right   Extremity/Trunk Assessment  Upper Extremity Assessment Upper Extremity Assessment: RUE deficits/detail RUE Deficits / Details: limited per passive protocl. hand WFL. orders for no elbow ROM RUE Coordination: decreased fine motor;decreased gross motor   Lower Extremity Assessment Lower Extremity Assessment: Overall WFL for tasks assessed   Cervical / Trunk Assessment Cervical / Trunk Assessment: Normal   Communication Communication Communication: No difficulties   Cognition Arousal/Alertness: Awake/alert Behavior During Therapy: WFL for tasks assessed/performed Overall Cognitive Status: Within Functional Limits for tasks assessed                     General Comments       Exercises Exercises: Shoulder     Shoulder Instructions Shoulder Instructions Donning/doffing shirt without moving shoulder: Patient able to independently direct caregiver Method for sponge bathing under operated UE: Patient able to independently direct caregiver Donning/doffing sling/immobilizer: Independent Correct positioning of sling/immobilizer: Independent Pendulum exercises (written home exercise program): Independent Sling wearing schedule (on at all times/off for ADL's): Independent Proper positioning of operated UE when showering: Independent Positioning of UE while sleeping: Independent    Home Living Family/patient expects to be discharged to:: Private residence Living Arrangements: Children Available Help at Discharge: Family;Available 24 hours/day Type of Home: House Home Access: Stairs to enter Entergy CorporationEntrance Stairs-Number of Steps: 3 Entrance  Stairs-Rails: None Home Layout: Able to live on main level with bedroom/bathroom     Bathroom Shower/Tub: Chief Strategy Officer: Standard     Home Equipment: None          Prior Functioning/Environment Level of Independence: Independent        Comments: Patient drives and works as Clinical biochemist.    OT Diagnosis: Generalized weakness;Acute pain   OT  Problem List: Decreased strength;Decreased range of motion;Decreased coordination;Decreased knowledge of use of DME or AE;Decreased knowledge of precautions;Impaired UE functional use;Pain   OT Treatment/Interventions:      OT Goals(Current goals can be found in the care plan section) Acute Rehab OT Goals Patient Stated Goal: to go home OT Goal Formulation: All assessment and education complete, DC therapy  OT Frequency:     Barriers to D/C:            Co-evaluation              End of Session Nurse Communication: Mobility status;Other (comment) (ready for D/C)  Activity Tolerance: Patient tolerated treatment well Patient left: in chair;with call bell/phone within reach   Time: 1106-1140 OT Time Calculation (min): 34 min Charges:  OT General Charges $OT Visit: 1 Procedure OT Evaluation $Initial OT Evaluation Tier I: 1 Procedure OT Treatments $Self Care/Home Management : 8-22 mins G-Codes: OT G-codes **NOT FOR INPATIENT CLASS** Functional Assessment Tool Used: clinical judgement Functional Limitation: Self care Self Care Current Status (Z6109): At least 40 percent but less than 60 percent impaired, limited or restricted Self Care Goal Status (U0454): At least 1 percent but less than 20 percent impaired, limited or restricted Self Care Discharge Status [phone]): At least 1 percent but less than 20 percent impaired, limited or restricted  [facility] 02/05/2015, 3:33 PM   [facility], OTR/L  [phone] 02/05/2015

## 2015-02-05 NOTE — Op Note (Signed)
NAME:  Patricia [name], Patricia [name]         ACCOUNT NO.:  [id number]  MEDICAL RECORD NO.:  [id number]  LOCATION:  [id number]                        FACILITY:  [facility]  PHYSICIAN:  Burnard BuntingG. Scott Dean, M.D.    DATE OF BIRTH:  [birth year]  DATE OF PROCEDURE: DATE OF DISCHARGE:                              OPERATIVE REPORT   PREOPERATIVE DIAGNOSIS:  Right shoulder rotator cuff tear.  POSTOPERATIVE DIAGNOSES:  Right shoulder rotator cuff tear, bursitis and significant flap tear and biceps tendonitis.  PROCEDURES:  Right shoulder arthroscopy, subacromial decompression, labral debridement, biceps tendon release, open biceps tenodesis and open rotator cuff tear repair.  SURGEON:  Burnard BuntingG. Scott Dean, M.D.  ASSISAmalia Hailey:  Dustin, RNFA.  INDICATIONS:  Patricia [name] is a 49 year old patient with fairly incapacitating right shoulder pain, presents for operative management after explanation of risks and benefits.  OPERATIVE FINDINGS: 1. Examination under anesthesia, range of motion.  The patient had     about 65 degrees of external rotation and 50 degrees of abduction,     good shoulder stability, 1+ anterior posterior less than 1 cm     sulcus sign.  Forward flexion was about 165, glenohumeral abduction     was over 95. 2. Diagnostic arthroscopy: 3. Significant fraying and degeneration and type 2 SLAP tear of the     biceps tendon with significant synovitis early within the rotator     cuff interval as well as in the superior labral capsular region. 4. Tear of the rotator cuff, 90% thickness at the posterior aspect of     the supraspinatus. 5. Intact glenohumeral articular surfaces. 6. Significant bursitis within the shoulder region.  PROCEDURE IN DETAIL:  The patient was brought to the operating room where general anesthetic was induced.  Preoperative antibiotics were administered.  Time-out was called.  The patient was placed in the beach- chair position with the head in neutral position.  Right arm, shoulder and hand  prescribed with alcohol and Betadine, allowed to air dry, prepped with DuraPrep solution and draped in sterile manner.  Collier Flowersoban was used to cover the axilla and most of the operative field.  The posterior portal created 2 cm medial and inferior to the posterolateral margin of the acromion.  Diagnostic arthroscopy was performed.  The patient did have significant degenerative tearing of the superior labrum as well as significant inflammation and synovitis at the biceps anchor as well as anteriorly within the rotator interval.  This was extensively debrided. Biceps tendon was released.  Superior labrum was debrided.  Rotator cuff tear was identified essentially where the skin set was at the posterolateral margin of the supraspinatus.  This was about a 90-95% thickness tear.  At this time, the anterior portal was created to facilitate debridement.  Lateral portal was created and subacromial decompression was performed.  At this time, instruments were removed from the portals.  Incision was made on the anterolateral margin of the acromion.  Deltoid was split and measured distance of 4 cm and marked with a #1 Vicryl suture.  Biceps tendon was then tenodesed within the groove using bioabsorbable Arthrex, bioabsorbable interference screw, 7 x 23 mm of rotator cuff tear was then repaired using single Arthrex fiber tape and suture lock.  This  gave watertight repair.  At this time, the incision was irrigated and closed with the deltoid split, closed using #1 Vicryl suture followed by interrupted inverted 2-0 Vicryl suture and a 3-0 Prolene.  The patient tolerated the procedure well without immediate complications, placed in dressing and sling.     Burnard Bunting, M.D.     GSD/MEDQ  D:  02/04/2015  T:  02/05/2015  Job:  [id number]

## 2015-02-05 NOTE — Progress Notes (Signed)
UR completed 

## 2015-02-05 NOTE — Progress Notes (Signed)
Pt stable - having pain right shoulder Plan dc today with CPM and long short acting oxy

## 2015-02-08 ENCOUNTER — Encounter (HOSPITAL_COMMUNITY): Payer: Self-pay | Admitting: Orthopedic Surgery

## 2015-02-08 NOTE — Discharge Summary (Signed)
Physician Discharge Summary  Patient ID: Patricia [name] MRN: [id number] DOB/AGE: June 26, 1966 49 y.o.  Admit date: 02/04/2015 Discharge date: 02/05/2015 Admission Diagnoses:  Active Problems:   Rotator cuff tear   Discharge Diagnoses:  Same  Surgeries: Procedure(s): SHOULDER ARTHROSCOPY WITH SUBACROMIAL DECOMPRESSION AND OPEN ROTATOR CUFF REPAIR, on 02/04/2015 - 02/05/2015   Consultants:    Discharged Condition: Stable  Hospital Course: Patricia [name] is an 49 y.o. female who was admitted 02/04/2015 with a chief complaint of right shoulder pain, and found to have a diagnosis of rotator cuff tear and labral tear.  They were brought to the operating room on 02/04/2015 - 02/05/2015 and underwent the above named procedures.Tolerated well - dced home on pod # 1 in good condition with post op cpm ordered    Antibiotics given:  Anti-infectives    Start     Dose/Rate Route Frequency Ordered Stop   02/05/15 0200  ceFAZolin (ANCEF) IVPB 2 g/50 mL premix     2 g 100 mL/hr over 30 Minutes Intravenous Every 6 hours 02/04/15 2238 02/05/15 0842   02/04/15 2238  fluconazole (DIFLUCAN) tablet 150 mg  Status:  Discontinued     150 mg Oral Daily PRN 02/04/15 2238 02/05/15 0125   02/04/15 0600  ceFAZolin (ANCEF) IVPB 2 g/50 mL premix     2 g 100 mL/hr over 30 Minutes Intravenous On call to O.R. 02/03/15 1425 02/04/15 1920    .  Recent vital signs:  Filed Vitals:   02/05/15 0445  BP: 160/105  Pulse: 86  Temp: 98.6 F (37 C)  Resp:     Recent laboratory studies:  Results for orders placed or performed during the hospital encounter of 02/04/15  Glucose, capillary  Result Value Ref Range   Glucose-Capillary 80 70 - 99 mg/dL  Glucose, capillary  Result Value Ref Range   Glucose-Capillary 86 70 - 99 mg/dL  Glucose, capillary  Result Value Ref Range   Glucose-Capillary 111 (H) 70 - 99 mg/dL  Glucose, capillary  Result Value Ref Range   Glucose-Capillary 151 (H) 70 - 99 mg/dL   Glucose, capillary  Result Value Ref Range   Glucose-Capillary 181 (H) 70 - 99 mg/dL    Discharge Medications:     Medication List    STOP taking these medications        amitriptyline 25 MG tablet  Commonly known as:  ELAVIL     oxyCODONE-acetaminophen 10-325 MG per tablet  Commonly known as:  PERCOCET      TAKE these medications        aspirin EC 81 MG tablet  Take 324 mg by mouth at bedtime.     canagliflozin 300 MG Tabs tablet  Commonly known as:  INVOKANA  Take 300 mg by mouth at bedtime.     chlorthalidone 25 MG tablet  Commonly known as:  HYGROTON  Take 25 mg by mouth at bedtime.     dipyridamole 75 MG tablet  Commonly known as:  PERSANTINE  Take 75 mg by mouth 3 (three) times daily.     fluconazole 150 MG tablet  Commonly known as:  DIFLUCAN  Take 150 mg by mouth daily as needed (for yeast infections).     Insulin Degludec 100 UNIT/ML Sopn  Inject 35 Units into the skin at bedtime as needed (CBG>171). TRESIBA     Nebivolol HCl 20 MG Tabs  Take 10 mg by mouth at bedtime. BYSTOLIC     oxyCODONE 5 MG immediate release tablet  Commonly known as:  Oxy IR/ROXICODONE  Take 1-2 tablets (5-10 mg total) by mouth every 3 (three) hours as needed for breakthrough pain.     OxyCODONE 10 mg T12a 12 hr tablet  Commonly known as:  OXYCONTIN  Take 1 tablet (10 mg total) by mouth every 12 (twelve) hours.     promethazine 25 MG tablet  Commonly known as:  PHENERGAN  Take 25 mg by mouth 3 (three) times daily as needed for nausea or vomiting.     rosuvastatin 10 MG tablet  Commonly known as:  CRESTOR  Take 10 mg by mouth 2 (two) times a week. Usually Tuesdays and Fridays at night     sitaGLIPtin-metformin 50-1000 MG per tablet  Commonly known as:  JANUMET  Take 1 tablet by mouth at bedtime.     tiZANidine 4 MG tablet  Commonly known as:  ZANAFLEX  Take 4 mg by mouth 3 (three) times daily as needed (migraines).        Diagnostic Studies: Mr Shoulder Right W  Contrast  01/20/2015   CLINICAL DATA:  Chronic RIGHT shoulder pain. RIGHT shoulder symptoms for 5 months. Two prior cortisone injections without relief.  EXAM: MR ARTHROGRAM OF THE RIGHT SHOULDER  TECHNIQUE: Multiplanar, multisequence MR imaging of the RIGHT shoulder was performed following the administration of intra-articular contrast.  CONTRAST:  See Injection Documentation.  COMPARISON:  None.  FINDINGS: Coronal images are motion degraded. Nonstandard coil placement was required due to body habitus.  Rotator cuff: There is an obliquely oriented full-thickness tear of the SUPRASPINATUS tendon midway between the critical zone in the central and posterior distal tendon. This is best seen on the coronal fat saturated images (image 7 series 8). Maximal retraction of articular surface fibers is between 2 cm and 3 cm. Intrasubstance extension of the tear is present in SUPRASPINATUS nearly to the anterior margin of the insertion. Intrasubstance tearing also extends into the INFRASPINATUS tendon.  Teres minor tendon appears within normal limits. SUBSCAPULARIS tendinopathy without tear. Articular surface fraying of SUBSCAPULARIS.  Muscles: No atrophy or edema.  Biceps long head: Intact.  Acromioclavicular Joint: Type 2 acromion. Mild AC joint osteoarthrosis.  Glenohumeral Joint: Glenohumeral ligaments appear intact.  Labrum: Intact. Abduction external rotation view was performed and appears normal.  Bones: Normal.  IMPRESSION: 1. Full-thickness partial width tear of the SUPRASPINATUS tendon midway between the critical zone in the insertion. The tear is obliquely oriented making width measurement difficult. Retraction of articular surface fibers is between 2 cm and 3 cm. 2. SUBSCAPULARIS tendinopathy and articular surface fraying.   Electronically Signed   By: Andreas NewportGeoffrey  Lamke M.D.   On: 01/20/2015 08:24   Dg Fluoro Guide Ndl Plc/bx  01/19/2015   CLINICAL DATA:  Right shoulder pain.  FLUOROSCOPY TIME:  Radiation  Exposure Index (as provided by the fluoroscopic device): 26.66 microGray*m2  Fluoroscopy Time (in minutes and seconds):  0 minutes 15 seconds  PROCEDURE: RIGHT SHOULDER INJECTION UNDER FLUOROSCOPY  An appropriate skin entrance site was determined. The site was marked, prepped with Betadine, draped in the usual sterile fashion, and infiltrated locally with buffered Lidocaine. 22 gauge spinal needle was advanced to the superomedial margin of the humeral head under intermittent fluoroscopy. 1 ml of Lidocaine injected easily. A mixture of 0.1 mL of MultiHance, 5 mL of 1% lidocaine, and 15 mL of Omnipaque 180 was then used to opacify the right shoulder capsule. A total volume of 12 mL was injected. No immediate complication.  IMPRESSION: Technically successful  right shoulder injection for MRI.   Electronically Signed   By: Sebastian Ache   On: 01/19/2015 16:26    Disposition: 01-Home or Self Care      Discharge Instructions    Call MD / Call 911    Complete by:  As directed   If you experience chest pain or shortness of breath, CALL 911 and be transported to the hospital emergency room.  If you develope a fever above 101 F, pus (white drainage) or increased drainage or redness at the wound, or calf pain, call your surgeon's office.     Constipation Prevention    Complete by:  As directed   Drink plenty of fluids.  Prune juice may be helpful.  You may use a stool softener, such as Colace (over the counter) 100 mg twice a day.  Use MiraLax (over the counter) for constipation as needed.     Diet - low sodium heart healthy    Complete by:  As directed      Discharge instructions    Complete by:  As directed   CPM 1 hour 3 times a day Keep incision dry Ok to change dressing sunday     Increase activity slowly as tolerated    Complete by:  As directed               Signed: DEAN,GREGORY SCOTT 02/08/2015, 10:41 AM

## 2015-02-20 ENCOUNTER — Encounter (HOSPITAL_COMMUNITY): Payer: Self-pay | Admitting: *Deleted

## 2015-02-20 ENCOUNTER — Observation Stay (HOSPITAL_COMMUNITY)
Admission: EM | Admit: 2015-02-20 | Discharge: 2015-02-22 | Disposition: A | Discharge status: Home / Self Care | Payer: BLUE CROSS/BLUE SHIELD | Attending: Internal Medicine | Admitting: Internal Medicine

## 2015-02-20 ENCOUNTER — Emergency Department (HOSPITAL_COMMUNITY): Payer: BLUE CROSS/BLUE SHIELD

## 2015-02-20 DIAGNOSIS — Z8739 Personal history of other diseases of the musculoskeletal system and connective tissue: Secondary | ICD-10-CM | POA: Diagnosis not present

## 2015-02-20 DIAGNOSIS — I10 Essential (primary) hypertension: Secondary | ICD-10-CM | POA: Insufficient documentation

## 2015-02-20 DIAGNOSIS — R200 Anesthesia of skin: Secondary | ICD-10-CM | POA: Diagnosis present

## 2015-02-20 DIAGNOSIS — E1165 Type 2 diabetes mellitus with hyperglycemia: Secondary | ICD-10-CM | POA: Diagnosis not present

## 2015-02-20 DIAGNOSIS — Z794 Long term (current) use of insulin: Secondary | ICD-10-CM | POA: Insufficient documentation

## 2015-02-20 DIAGNOSIS — G459 Transient cerebral ischemic attack, unspecified: Secondary | ICD-10-CM | POA: Diagnosis not present

## 2015-02-20 DIAGNOSIS — M542 Cervicalgia: Secondary | ICD-10-CM

## 2015-02-20 DIAGNOSIS — Z7982 Long term (current) use of aspirin: Secondary | ICD-10-CM | POA: Insufficient documentation

## 2015-02-20 DIAGNOSIS — J45909 Unspecified asthma, uncomplicated: Secondary | ICD-10-CM | POA: Diagnosis not present

## 2015-02-20 DIAGNOSIS — N179 Acute kidney failure, unspecified: Secondary | ICD-10-CM | POA: Diagnosis not present

## 2015-02-20 DIAGNOSIS — Z79899 Other long term (current) drug therapy: Secondary | ICD-10-CM | POA: Insufficient documentation

## 2015-02-20 DIAGNOSIS — Z8673 Personal history of transient ischemic attack (TIA), and cerebral infarction without residual deficits: Secondary | ICD-10-CM | POA: Insufficient documentation

## 2015-02-20 DIAGNOSIS — Z9889 Other specified postprocedural states: Secondary | ICD-10-CM | POA: Insufficient documentation

## 2015-02-20 DIAGNOSIS — R739 Hyperglycemia, unspecified: Secondary | ICD-10-CM | POA: Diagnosis not present

## 2015-02-20 LAB — DIFFERENTIAL
BASOS ABS: 0 10*3/uL (ref 0.0–0.1)
BASOS PCT: 0 % (ref 0–1)
Eosinophils Absolute: 0.1 10*3/uL (ref 0.0–0.7)
Eosinophils Relative: 1 % (ref 0–5)
Lymphocytes Relative: 25 % (ref 12–46)
Lymphs Abs: 2.1 10*3/uL (ref 0.7–4.0)
Monocytes Absolute: 0.6 10*3/uL (ref 0.1–1.0)
Monocytes Relative: 7 % (ref 3–12)
NEUTROS ABS: 5.4 10*3/uL (ref 1.7–7.7)
Neutrophils Relative %: 67 % (ref 43–77)

## 2015-02-20 LAB — I-STAT VENOUS BLOOD GAS, ED
Acid-base deficit: 5 mmol/L — ABNORMAL HIGH (ref 0.0–2.0)
Bicarbonate: 22.7 mEq/L (ref 20.0–24.0)
O2 SAT: 32 %
TCO2: 24 mmol/L (ref 0–100)
pCO2, Ven: 49.3 mmHg (ref 45.0–50.0)
pH, Ven: 7.271 (ref 7.250–7.300)
pO2, Ven: 23 mmHg — CL (ref 30.0–45.0)

## 2015-02-20 LAB — COMPREHENSIVE METABOLIC PANEL
ALBUMIN: 3.5 g/dL (ref 3.5–5.2)
ALT: 15 U/L (ref 0–35)
AST: 18 U/L (ref 0–37)
Alkaline Phosphatase: 128 U/L — ABNORMAL HIGH (ref 39–117)
Anion gap: 13 (ref 5–15)
BILIRUBIN TOTAL: 0.2 mg/dL — AB (ref 0.3–1.2)
BUN: 39 mg/dL — ABNORMAL HIGH (ref 6–23)
CALCIUM: 9.1 mg/dL (ref 8.4–10.5)
CHLORIDE: 93 mmol/L — AB (ref 96–112)
CO2: 25 mmol/L (ref 19–32)
CREATININE: 2.41 mg/dL — AB (ref 0.50–1.10)
GFR calc non Af Amer: 23 mL/min — ABNORMAL LOW (ref >90)
GFR, EST AFRICAN AMERICAN: 26 mL/min — AB (ref >90)
Glucose, Bld: 644 mg/dL (ref 70–99)
Potassium: 3.5 mmol/L (ref 3.5–5.1)
Sodium: 131 mmol/L — ABNORMAL LOW (ref 135–145)
Total Protein: 7.4 g/dL (ref 6.0–8.3)

## 2015-02-20 LAB — PROTIME-INR
INR: 1.05 (ref 0.00–1.49)
Prothrombin Time: 13.8 seconds (ref 11.6–15.2)

## 2015-02-20 LAB — I-STAT CHEM 8, ED
BUN: 41 mg/dL — ABNORMAL HIGH (ref 6–23)
CALCIUM ION: 1.13 mmol/L (ref 1.12–1.23)
CHLORIDE: 95 mmol/L — AB (ref 96–112)
Creatinine, Ser: 2 mg/dL — ABNORMAL HIGH (ref 0.50–1.10)
Glucose, Bld: 633 mg/dL (ref 70–99)
HEMATOCRIT: 43 % (ref 36.0–46.0)
HEMOGLOBIN: 14.6 g/dL (ref 12.0–15.0)
Potassium: 3.5 mmol/L (ref 3.5–5.1)
Sodium: 131 mmol/L — ABNORMAL LOW (ref 135–145)
TCO2: 20 mmol/L (ref 0–100)

## 2015-02-20 LAB — CBC
HEMATOCRIT: 39.9 % (ref 36.0–46.0)
HEMOGLOBIN: 13.1 g/dL (ref 12.0–15.0)
MCH: 28.6 pg (ref 26.0–34.0)
MCHC: 32.8 g/dL (ref 30.0–36.0)
MCV: 87.1 fL (ref 78.0–100.0)
Platelets: 239 10*3/uL (ref 150–400)
RBC: 4.58 MIL/uL (ref 3.87–5.11)
RDW: 12.8 % (ref 11.5–15.5)
WBC: 8.1 10*3/uL (ref 4.0–10.5)

## 2015-02-20 LAB — CBG MONITORING, ED
GLUCOSE-CAPILLARY: 582 mg/dL — AB (ref 70–99)
Glucose-Capillary: 447 mg/dL — ABNORMAL HIGH (ref 70–99)

## 2015-02-20 LAB — APTT: aPTT: 28 seconds (ref 24–37)

## 2015-02-20 LAB — I-STAT TROPONIN, ED: TROPONIN I, POC: 0 ng/mL (ref 0.00–0.08)

## 2015-02-20 MED ORDER — INSULIN ASPART 100 UNIT/ML ~~LOC~~ SOLN
10.0000 [IU] | Freq: Once | SUBCUTANEOUS | Status: AC
  Administered 2015-02-20: 10 [IU] via SUBCUTANEOUS
  Filled 2015-02-20: qty 1

## 2015-02-20 MED ORDER — SODIUM CHLORIDE 0.9 % IJ SOLN
3.0000 mL | Freq: Two times a day (BID) | INTRAMUSCULAR | Status: DC
  Administered 2015-02-21: 3 mL via INTRAVENOUS

## 2015-02-20 MED ORDER — ACETAMINOPHEN 650 MG RE SUPP: 650.0000 mg | Freq: Four times a day (QID) | RECTAL | Status: DC | PRN

## 2015-02-20 MED ORDER — STROKE: EARLY STAGES OF RECOVERY BOOK
Freq: Once | Status: AC
  Administered 2015-02-21: 01:00:00
  Filled 2015-02-20: qty 1

## 2015-02-20 MED ORDER — OXYCODONE HCL ER 10 MG PO T12A
10.0000 mg | EXTENDED_RELEASE_TABLET | Freq: Two times a day (BID) | ORAL | Status: DC
  Administered 2015-02-21: 10 mg via ORAL
  Filled 2015-02-20: qty 1

## 2015-02-20 MED ORDER — ACETAMINOPHEN 325 MG PO TABS
650.0000 mg | ORAL_TABLET | Freq: Four times a day (QID) | ORAL | Status: DC | PRN
  Administered 2015-02-21: 650 mg via ORAL
  Filled 2015-02-20: qty 2

## 2015-02-20 MED ORDER — INSULIN DEGLUDEC 100 UNIT/ML ~~LOC~~ SOPN: 35.0000 [IU] | PEN_INJECTOR | Freq: Every evening | SUBCUTANEOUS | Status: DC | PRN

## 2015-02-20 MED ORDER — NEBIVOLOL HCL 10 MG PO TABS
10.0000 mg | ORAL_TABLET | Freq: Every day | ORAL | Status: DC
  Administered 2015-02-21 (×2): 10 mg via ORAL
  Filled 2015-02-20 (×3): qty 1

## 2015-02-20 MED ORDER — SODIUM CHLORIDE 0.9 % IV SOLN
INTRAVENOUS | Status: DC
  Administered 2015-02-21: 150 mL/h via INTRAVENOUS

## 2015-02-20 MED ORDER — ENOXAPARIN SODIUM 30 MG/0.3ML ~~LOC~~ SOLN: 30.0000 mg | SUBCUTANEOUS | Status: DC

## 2015-02-20 MED ORDER — TIZANIDINE HCL 4 MG PO TABS
4.0000 mg | ORAL_TABLET | Freq: Three times a day (TID) | ORAL | Status: DC | PRN
Start: 2015-02-20 — End: 2015-02-21
  Filled 2015-02-20: qty 1

## 2015-02-20 MED ORDER — ROSUVASTATIN CALCIUM 10 MG PO TABS: 10.0000 mg | ORAL_TABLET | ORAL | Status: DC

## 2015-02-20 MED ORDER — DIPYRIDAMOLE 75 MG PO TABS
75.0000 mg | ORAL_TABLET | Freq: Three times a day (TID) | ORAL | Status: DC
  Administered 2015-02-21 – 2015-02-22 (×5): 75 mg via ORAL
  Filled 2015-02-20 (×7): qty 1

## 2015-02-20 MED ORDER — ENOXAPARIN SODIUM 40 MG/0.4ML ~~LOC~~ SOLN
40.0000 mg | Freq: Every day | SUBCUTANEOUS | Status: DC
  Administered 2015-02-21: 40 mg via SUBCUTANEOUS
  Filled 2015-02-20 (×2): qty 0.4

## 2015-02-20 MED ORDER — SODIUM CHLORIDE 0.9 % IV BOLUS (SEPSIS)
2000.0000 mL | Freq: Once | INTRAVENOUS | Status: AC
  Administered 2015-02-20: 2000 mL via INTRAVENOUS

## 2015-02-20 MED ORDER — INSULIN ASPART 100 UNIT/ML ~~LOC~~ SOLN: 0.0000 [IU] | Freq: Three times a day (TID) | SUBCUTANEOUS | Status: DC

## 2015-02-20 MED ORDER — OXYCODONE HCL 5 MG PO TABS
5.0000 mg | ORAL_TABLET | ORAL | Status: DC | PRN
  Administered 2015-02-21 (×2): 10 mg via ORAL
  Filled 2015-02-20 (×2): qty 2

## 2015-02-20 MED ORDER — ASPIRIN EC 325 MG PO TBEC
325.0000 mg | DELAYED_RELEASE_TABLET | Freq: Every day | ORAL | Status: DC
  Administered 2015-02-21 (×2): 325 mg via ORAL
  Filled 2015-02-20 (×3): qty 1

## 2015-02-20 MED ORDER — SODIUM CHLORIDE 0.9 % IV BOLUS (SEPSIS)
1000.0000 mL | Freq: Once | INTRAVENOUS | Status: AC
  Administered 2015-02-20: 1000 mL via INTRAVENOUS

## 2015-02-20 MED ORDER — PROMETHAZINE HCL 25 MG PO TABS
25.0000 mg | ORAL_TABLET | Freq: Three times a day (TID) | ORAL | Status: DC | PRN
  Administered 2015-02-21: 25 mg via ORAL
  Filled 2015-02-20: qty 1

## 2015-02-20 NOTE — Progress Notes (Signed)
Called and received report from ED nurse,Room ready and awaiting patient.

## 2015-02-20 NOTE — ED Notes (Signed)
The pt is c/o weakness and being tired with some rt sided numbness on her rt side arm and leg.  Hx od stroke that involved that rt arm in July last year.  Her family member reports that the pt has not been normal since last pm and today she has been c/o some visual disturbances and speech difficulty.  The pt thinks that  Her symptoms went away then returned 3 hours ago.  2 weeks ago the pt had rotator cuff surgery  Rt shoulder

## 2015-02-20 NOTE — H&P (Signed)
Patricia [name] is an 49 y.o. female.    Patricia [name] (pcp)  Chief Complaint: weak, and slight dragging of her right ft HPI: 49 yo female with htn, dm2, cva apparently c/o slight generalized weakness and then some slight dragging of her right foot.  Pt's symptoms started 6 pm today.  The foot weakness lasted for about 1 hour. Pt states that other than weakness, slight dizziness. Pt was evaluated in ED and found to have hyperglycemia bs 644.  CT scan brain negative. CXR showed interstitial prominence.  But pt has no symptoms of dyspnea. Pt has recently been placed on ibuprofen, and noted to have ARF.  Bun 39, creatinine 2.41  Pt will be admitted for ARF, and r/o CVA but most likely her symptoms were probably caused by hyperglycemia.   Past Medical History  Diagnosis Date  . Hypertension   . Diabetes mellitus without complication   . Stroke 2009    lasdt stroke July 2015  . Asthma     " a long time ago"  . Headache     migraines  . Rotator cuff tear     right  . Bursitis     Past Surgical History  Procedure Laterality Date  . Cardiac surgery  Closed hole in heart in 2009    Amplatzer Septal Occluder Ref 9-ASD-010 (not listed in op notes, document scanned under MRI with written op notes  . Hysterotomy    . Breast reduction surgery    . Cardiac catheterization    . Abdominal hysterectomy    . Shoulder arthroscopy with open rotator cuff repair and distal clavicle acrominectomy Right 02/04/2015    Procedure: SHOULDER ARTHROSCOPY WITH SUBACROMIAL DECOMPRESSION AND OPEN ROTATOR CUFF REPAIR,;  Surgeon: Meredith Pel, MD;  Location: [facility];  Service: Orthopedics;  Laterality: Right;  RIGHT SHOULDER DOA,SAD,MINI-OPEN ROTATOR CUFF TEAR REPAIR.    Family History  Problem Relation Age of Onset  . Hypertension Mother   . Hypertension Father   . Cancer Maternal Aunt   . Cancer Maternal Uncle    Social History:  reports that she has never smoked. She has never used smokeless tobacco.  She reports that she does not drink alcohol or use illicit drugs.  Allergies:  Allergies  Allergen Reactions  . Imitrex [Sumatriptan] Anaphylaxis  . Plavix [Clopidogrel Bisulfate] Hives  . Lipitor [Atorvastatin] Rash   Medications reviewd    (Not in a hospital admission)  Results for orders placed or performed during the hospital encounter of 02/20/15 (from the past 48 hour(s))  CBG monitoring, ED     Status: Abnormal   Collection Time: 02/20/15  8:14 PM  Result Value Ref Range   Glucose-Capillary 582 (HH) 70 - 99 mg/dL   Comment 1 Notify RN   Protime-INR     Status: None   Collection Time: 02/20/15  8:25 PM  Result Value Ref Range   Prothrombin Time 13.8 11.6 - 15.2 seconds   INR 1.05 0.00 - 1.49  APTT     Status: None   Collection Time: 02/20/15  8:25 PM  Result Value Ref Range   aPTT 28 24 - 37 seconds  CBC     Status: None   Collection Time: 02/20/15  8:25 PM  Result Value Ref Range   WBC 8.1 4.0 - 10.5 K/uL   RBC 4.58 3.87 - 5.11 MIL/uL   Hemoglobin 13.1 12.0 - 15.0 g/dL   HCT 39.9 36.0 - 46.0 %   MCV 87.1 78.0 -  100.0 fL   MCH 28.6 26.0 - 34.0 pg   MCHC 32.8 30.0 - 36.0 g/dL   RDW 12.8 11.5 - 15.5 %   Platelets 239 150 - 400 K/uL  Differential     Status: None   Collection Time: 02/20/15  8:25 PM  Result Value Ref Range   Neutrophils Relative % 67 43 - 77 %   Neutro Abs 5.4 1.7 - 7.7 K/uL   Lymphocytes Relative 25 12 - 46 %   Lymphs Abs 2.1 0.7 - 4.0 K/uL   Monocytes Relative 7 3 - 12 %   Monocytes Absolute 0.6 0.1 - 1.0 K/uL   Eosinophils Relative 1 0 - 5 %   Eosinophils Absolute 0.1 0.0 - 0.7 K/uL   Basophils Relative 0 0 - 1 %   Basophils Absolute 0.0 0.0 - 0.1 K/uL  Comprehensive metabolic panel     Status: Abnormal   Collection Time: 02/20/15  8:25 PM  Result Value Ref Range   Sodium 131 (L) 135 - 145 mmol/L   Potassium 3.5 3.5 - 5.1 mmol/L   Chloride 93 (L) 96 - 112 mmol/L   CO2 25 19 - 32 mmol/L   Glucose, Bld 644 (HH) 70 - 99 mg/dL     Comment: REPEATED TO VERIFY CRITICAL RESULT CALLED TO, READ BACK BY AND VERIFIED WITH: BRICKER A,RN 02/20/15 2125 WAYK    BUN 39 (H) 6 - 23 mg/dL   Creatinine, Ser 2.41 (H) 0.50 - 1.10 mg/dL   Calcium 9.1 8.4 - 10.5 mg/dL   Total Protein 7.4 6.0 - 8.3 g/dL   Albumin 3.5 3.5 - 5.2 g/dL   AST 18 0 - 37 U/L   ALT 15 0 - 35 U/L   Alkaline Phosphatase 128 (H) 39 - 117 U/L   Total Bilirubin 0.2 (L) 0.3 - 1.2 mg/dL   GFR calc non Af Amer 23 (L) >90 mL/min   GFR calc Af Amer 26 (L) >90 mL/min    Comment: (NOTE) The eGFR has been calculated using the CKD EPI equation. This calculation has not been validated in all clinical situations. eGFR's persistently <90 mL/min signify possible Chronic Kidney Disease.    Anion gap 13 5 - 15  I-stat troponin, ED (not at [facility], [facility])     Status: None   Collection Time: 02/20/15  8:32 PM  Result Value Ref Range   Troponin i, poc 0.00 0.00 - 0.08 ng/mL   Comment 3            Comment: Due to the release kinetics of cTnI, a negative result within the first hours of the onset of symptoms does not rule out myocardial infarction with certainty. If myocardial infarction is still suspected, repeat the test at appropriate intervals.   I-Stat Chem 8, ED  (not at [facility], [facility])     Status: Abnormal   Collection Time: 02/20/15  8:34 PM  Result Value Ref Range   Sodium 131 (L) 135 - 145 mmol/L   Potassium 3.5 3.5 - 5.1 mmol/L   Chloride 95 (L) 96 - 112 mmol/L   BUN 41 (H) 6 - 23 mg/dL   Creatinine, Ser 2.00 (H) 0.50 - 1.10 mg/dL   Glucose, Bld 633 (HH) 70 - 99 mg/dL   Calcium, Ion 1.13 1.12 - 1.23 mmol/L   TCO2 20 0 - 100 mmol/L   Hemoglobin 14.6 12.0 - 15.0 g/dL   HCT 43.0 36.0 - 46.0 %   Comment NOTIFIED PHYSICIAN   CBG monitoring,  ED     Status: Abnormal   Collection Time: 02/20/15 10:50 PM  Result Value Ref Range   Glucose-Capillary 447 (H) 70 - 99 mg/dL   Dg Chest 2 View  02/20/2015   CLINICAL DATA:  Weakness, fatigue, LEFT shoulder pain. History of  hypertension, diabetes, stroke and asthma.  EXAM: CHEST  2 VIEW  COMPARISON:  Chest radiograph May 22, 2014  FINDINGS: The cardiac silhouette appears mildly enlarged. Mediastinal silhouette is nonsuspicious giving low inspiratory AP technique. Diffuse mild interstitial prominence. No pleural effusion or focal consolidation. Elevated RIGHT hemidiaphragm. No pneumothorax.  Large body habitus. Moderate degenerative change of the thoracic spine.  IMPRESSION: Mild cardiomegaly. Diffuse mild interstitial prominence could reflect pulmonary edema or atypical infection without focal consolidation.   Electronically Signed   By: Elon Alas   On: 02/20/2015 22:33   Ct Head (brain) Wo Contrast  02/20/2015   CLINICAL DATA:  Weakness an right-sided numbness. Previous history of stroke.  EXAM: CT HEAD WITHOUT CONTRAST  TECHNIQUE: Contiguous axial images were obtained from the base of the skull through the vertex without intravenous contrast.  COMPARISON:  Head CT 07/03/2014.  MRI 05/22/2014.  FINDINGS: There is chronic low-density in the left frontal white matter and affecting a a right frontal gyrus consistent with previously seen old infarctions. No sign of acute infarction, mass lesion, hemorrhage, hydrocephalus or extra-axial collection. No calvarial abnormality. Sinuses, middle ears and mastoids are clear.  IMPRESSION: No acute finding by CT. Old right frontal cortical infarction. Old left frontal white matter infarction.   Electronically Signed   By: Nelson Chimes M.D.   On: 02/20/2015 21:07    Review of Systems  Constitutional: Negative.   HENT: Negative.   Eyes: Negative.   Respiratory: Negative.   Cardiovascular: Negative.   Gastrointestinal: Negative.   Genitourinary: Negative.   Musculoskeletal: Negative.   Skin: Negative.   Neurological: Negative.   Endo/Heme/Allergies: Negative.   Psychiatric/Behavioral: Negative.     Blood pressure 123/78, pulse 70, temperature 98.3 F (36.8 C),  temperature source Oral, resp. rate 18, SpO2 97 %. Physical Exam  Constitutional: She is oriented to person, place, and time. She appears well-developed and well-nourished.  HENT:  Head: Normocephalic.  Eyes: Conjunctivae and EOM are normal. Pupils are equal, round, and reactive to light. No scleral icterus.  Neck: Normal range of motion. Neck supple. No JVD present. No tracheal deviation present. No thyromegaly present.  Cardiovascular: Normal rate and regular rhythm.  Exam reveals no gallop and no friction rub.   No murmur heard. Respiratory: Effort normal and breath sounds normal. No respiratory distress. She has no wheezes. She has no rales.  GI: Soft. Bowel sounds are normal. She exhibits no distension. There is no tenderness. There is no rebound and no guarding.  Musculoskeletal: Normal range of motion. She exhibits no edema or tenderness.  Lymphadenopathy:    She has no cervical adenopathy.  Neurological: She is alert and oriented to person, place, and time. She has normal reflexes. She displays normal reflexes. No cranial nerve deficit. She exhibits normal muscle tone. Coordination normal.  Skin: Skin is warm and dry. No rash noted. No erythema. No pallor.  Psychiatric: She has a normal mood and affect. Her behavior is normal. Judgment and thought content normal.     Assessment/Plan Weakness right ft  ? TIA Check MRI brain Check carotid u/s Check cardiac echo  Dm2/Hyperglycemia NPO except medications Ns iv fsbs q4h iss Stop janumet and invokana due to renal insufficiency  Note patient has not been checking her sugar at home, educated today of importance.   ARF Check urine sodium, urine creatinnie, urine eosinophils Check renal ultrasound  DVT prophylaxis: scd, lovenox    Urijah Arko 02/20/2015, 11:12 PM

## 2015-02-20 NOTE — ED Notes (Signed)
The rt arm  Check is  Soreness from the rt shoulder surgery difficulty with movement

## 2015-02-20 NOTE — ED Notes (Signed)
Patient transported to X-ray 

## 2015-02-21 ENCOUNTER — Observation Stay (HOSPITAL_COMMUNITY): Payer: BLUE CROSS/BLUE SHIELD

## 2015-02-21 DIAGNOSIS — N179 Acute kidney failure, unspecified: Secondary | ICD-10-CM | POA: Diagnosis not present

## 2015-02-21 LAB — URINALYSIS, ROUTINE W REFLEX MICROSCOPIC
BILIRUBIN URINE: NEGATIVE
Glucose, UA: >1000 mg/dL — AB
Hgb urine dipstick: NEGATIVE
KETONES UR: NEGATIVE mg/dL
Leukocytes, UA: NEGATIVE
NITRITE: NEGATIVE
PH: 5 (ref 5.0–8.0)
Protein, ur: NEGATIVE mg/dL
Specific Gravity, Urine: 1.024 (ref 1.005–1.030)
Urobilinogen, UA: 0.2 mg/dL (ref 0.0–1.0)

## 2015-02-21 LAB — BASIC METABOLIC PANEL
Anion gap: 8 (ref 5–15)
BUN: 31 mg/dL — ABNORMAL HIGH (ref 6–20)
CALCIUM: 8.5 mg/dL — AB (ref 8.9–10.3)
CHLORIDE: 107 mmol/L (ref 101–111)
CO2: 24 mmol/L (ref 22–32)
Creatinine, Ser: 1.41 mg/dL — ABNORMAL HIGH (ref 0.44–1.00)
GFR calc non Af Amer: 44 mL/min — ABNORMAL LOW (ref >60)
GFR, EST AFRICAN AMERICAN: 50 mL/min — AB (ref >60)
Glucose, Bld: 104 mg/dL — ABNORMAL HIGH (ref 70–99)
Potassium: 3 mmol/L — ABNORMAL LOW (ref 3.5–5.1)
Sodium: 139 mmol/L (ref 135–145)

## 2015-02-21 LAB — CBC
HEMATOCRIT: 35.9 % — AB (ref 36.0–46.0)
Hemoglobin: 11.7 g/dL — ABNORMAL LOW (ref 12.0–15.0)
MCH: 28.1 pg (ref 26.0–34.0)
MCHC: 32.6 g/dL (ref 30.0–36.0)
MCV: 86.1 fL (ref 78.0–100.0)
Platelets: 217 10*3/uL (ref 150–400)
RBC: 4.17 MIL/uL (ref 3.87–5.11)
RDW: 12.7 % (ref 11.5–15.5)
WBC: 7 10*3/uL (ref 4.0–10.5)

## 2015-02-21 LAB — LIPID PANEL
Cholesterol: 141 mg/dL (ref 0–200)
HDL: 29 mg/dL — ABNORMAL LOW (ref >40)
LDL Cholesterol: 84 mg/dL (ref 0–99)
Total CHOL/HDL Ratio: 4.9 RATIO
Triglycerides: 142 mg/dL (ref <150)
VLDL: 28 mg/dL (ref 0–40)

## 2015-02-21 LAB — SEDIMENTATION RATE: Sed Rate: 26 mm/hr — ABNORMAL HIGH (ref 0–22)

## 2015-02-21 LAB — GLUCOSE, CAPILLARY
GLUCOSE-CAPILLARY: 104 mg/dL — AB (ref 70–99)
GLUCOSE-CAPILLARY: 131 mg/dL — AB (ref 70–99)
GLUCOSE-CAPILLARY: 120 mg/dL — AB (ref 70–99)
Glucose-Capillary: 239 mg/dL — ABNORMAL HIGH (ref 70–99)
Glucose-Capillary: 230 mg/dL — ABNORMAL HIGH (ref 70–99)
Glucose-Capillary: 197 mg/dL — ABNORMAL HIGH (ref 70–99)

## 2015-02-21 LAB — URINE MICROSCOPIC-ADD ON

## 2015-02-21 LAB — CK: CK TOTAL: 108 U/L (ref 38–234)

## 2015-02-21 LAB — SODIUM, URINE, RANDOM: Sodium, Ur: 97 mmol/L

## 2015-02-21 MED ORDER — OXYCODONE HCL 5 MG PO TABS
5.0000 mg | ORAL_TABLET | ORAL | Status: DC | PRN
  Administered 2015-02-21 – 2015-02-22 (×4): 10 mg via ORAL
  Filled 2015-02-21 (×4): qty 2

## 2015-02-21 MED ORDER — TIZANIDINE HCL 4 MG PO TABS
4.0000 mg | ORAL_TABLET | Freq: Four times a day (QID) | ORAL | Status: DC | PRN
  Administered 2015-02-21 – 2015-02-22 (×3): 4 mg via ORAL
  Filled 2015-02-21 (×4): qty 1

## 2015-02-21 MED ORDER — INSULIN ASPART 100 UNIT/ML ~~LOC~~ SOLN
0.0000 [IU] | Freq: Three times a day (TID) | SUBCUTANEOUS | Status: DC
  Administered 2015-02-21: 3 [IU] via SUBCUTANEOUS
  Administered 2015-02-21: 1 [IU] via SUBCUTANEOUS

## 2015-02-21 MED ORDER — POTASSIUM CHLORIDE CRYS ER 20 MEQ PO TBCR
40.0000 meq | EXTENDED_RELEASE_TABLET | Freq: Once | ORAL | Status: AC
  Administered 2015-02-21: 40 meq via ORAL
  Filled 2015-02-21: qty 2

## 2015-02-21 MED ORDER — INSULIN GLARGINE 100 UNIT/ML ~~LOC~~ SOLN
25.0000 [IU] | Freq: Every day | SUBCUTANEOUS | Status: DC
  Administered 2015-02-21 (×2): 25 [IU] via SUBCUTANEOUS
  Filled 2015-02-21 (×3): qty 0.25

## 2015-02-21 MED ORDER — LORAZEPAM 2 MG/ML IJ SOLN
1.0000 mg | Freq: Once | INTRAMUSCULAR | Status: AC
  Administered 2015-02-21: 1 mg via INTRAVENOUS
  Filled 2015-02-21: qty 1

## 2015-02-21 MED ORDER — SODIUM CHLORIDE 0.9 % IV SOLN
INTRAVENOUS | Status: DC
  Administered 2015-02-21 (×2): via INTRAVENOUS

## 2015-02-21 NOTE — ED Provider Notes (Signed)
CSN: [id number]     Arrival date & time 02/20/15  1956 History   First MD Initiated Contact with Patient 02/20/15 2035     Chief Complaint  Patient presents with  . Numbness     (Consider location/radiation/quality/duration/timing/severity/associated sxs/prior Treatment) HPI   This is a 49 year old female, with a history of diabetes, hypertension, CVA, presenting today with fatigue. This started "days ago." Mostly at home. Worsening over the last 2 weeks, ever since she stopped taking her insulin. It became severe today, with diffuse weakness. Negative for chest pain, shortness of breath, lightheadedness, focal weakness, numbness, or tingling. Negative for head injury. Negative for fever, chills. Positive for polyuria. Positive for mucosal drying.  Past Medical History  Diagnosis Date  . Hypertension   . Diabetes mellitus without complication   . Stroke 2009    lasdt stroke July 2015  . Asthma     " a long time ago"  . Headache     migraines  . Rotator cuff tear     right  . Bursitis    Past Surgical History  Procedure Laterality Date  . Cardiac surgery  Closed hole in heart in 2009    Amplatzer Septal Occluder Ref 9-ASD-010 (not listed in op notes, document scanned under MRI with written op notes  . Hysterotomy    . Breast reduction surgery    . Cardiac catheterization    . Abdominal hysterectomy    . Shoulder arthroscopy with open rotator cuff repair and distal clavicle acrominectomy Right 02/04/2015    Procedure: SHOULDER ARTHROSCOPY WITH SUBACROMIAL DECOMPRESSION AND OPEN ROTATOR CUFF REPAIR,;  Surgeon: Cammy Copa, MD;  Location: [facility];  Service: Orthopedics;  Laterality: Right;  RIGHT SHOULDER DOA,SAD,MINI-OPEN ROTATOR CUFF TEAR REPAIR.   Family History  Problem Relation Age of Onset  . Hypertension Mother   . Hypertension Father   . Cancer Maternal Aunt   . Cancer Maternal Uncle    History  Substance Use Topics  . Smoking status: Never Smoker   .  Smokeless tobacco: Never Used  . Alcohol Use: No     Comment: occasional   OB History    No data available     Review of Systems  Constitutional: Positive for fatigue. Negative for fever and chills.  HENT: Negative for facial swelling.   Eyes: Negative for photophobia and pain.  Respiratory: Negative for cough and shortness of breath.   Cardiovascular: Negative for chest pain and leg swelling.  Gastrointestinal: Negative for nausea, vomiting and abdominal pain.  Genitourinary: Negative for dysuria.  Musculoskeletal: Positive for arthralgias.  Skin: Negative for rash and wound.  Neurological: Negative for seizures.  Hematological: Negative for adenopathy.      Allergies  Imitrex; Plavix; and Lipitor  Home Medications   Prior to Admission medications   Medication Sig Start Date End Date Taking? Authorizing Provider  aspirin 325 MG tablet Take 325 mg by mouth daily.   Yes Historical Provider, MD  canagliflozin (INVOKANA) 300 MG TABS tablet Take 300 mg by mouth at bedtime.   Yes Historical Provider, MD  chlorthalidone (HYGROTON) 25 MG tablet Take 25 mg by mouth at bedtime.  08/19/14  Yes Historical Provider, MD  clotrimazole-betamethasone (LOTRISONE) cream Apply 1 application topically daily as needed. Skin irritation 01/27/15  Yes Historical Provider, MD  dipyridamole (PERSANTINE) 75 MG tablet Take 75 mg by mouth 3 (three) times daily.  11/22/14  Yes Historical Provider, MD  fluconazole (DIFLUCAN) 150 MG tablet Take 150 mg by mouth  daily as needed (for yeast infections).   Yes Historical Provider, MD  Insulin Degludec 100 UNIT/ML SOPN Inject 35 Units into the skin at bedtime as needed (CBG>171). TRESIBA   Yes Historical Provider, MD  methocarbamol (ROBAXIN) 500 MG tablet Take 500 mg by mouth daily as needed for muscle spasms.  02/10/15  Yes Historical Provider, MD  Nebivolol HCl 20 MG TABS Take 10 mg by mouth at bedtime. BYSTOLIC   Yes Historical Provider, MD  oxyCODONE (OXY  IR/ROXICODONE) 5 MG immediate release tablet Take 1-2 tablets (5-10 mg total) by mouth every 3 (three) hours as needed for breakthrough pain. 02/05/15  Yes Cammy CopaScott Gregory Dean, MD  OxyCODONE (OXYCONTIN) 10 mg T12A 12 hr tablet Take 1 tablet (10 mg total) by mouth every 12 (twelve) hours. 02/05/15  Yes Scott Rise PaganiniGregory Dean, MD  promethazine (PHENERGAN) 25 MG tablet Take 25 mg by mouth 3 (three) times daily as needed for nausea or vomiting.    Yes Historical Provider, MD  rosuvastatin (CRESTOR) 10 MG tablet Take 10 mg by mouth 2 (two) times a week. Usually Tuesdays and Fridays at night   Yes Historical Provider, MD  sitaGLIPtin-metformin (JANUMET) 50-1000 MG per tablet Take 1 tablet by mouth at bedtime.   Yes Historical Provider, MD  tiZANidine (ZANAFLEX) 4 MG tablet Take 4 mg by mouth every 8 (eight) hours as needed (migraines).    Yes Historical Provider, MD   BP 141/65 mmHg  Pulse 68  Temp(Src) 98.1 F (36.7 C) (Oral)  Resp 18  Ht 5' 5.5" (1.664 m)  Wt 227 lb 6.4 oz (103.148 kg)  BMI 37.25 kg/m2  SpO2 100% Physical Exam  Constitutional: She is oriented to person, place, and time. She appears well-developed and well-nourished. No distress.  HENT:  Head: Normocephalic and atraumatic.  Mouth/Throat: No oropharyngeal exudate.  Eyes: Conjunctivae are normal. Pupils are equal, round, and reactive to light. No scleral icterus.  Neck: Normal range of motion. No tracheal deviation present. No thyromegaly present.  Cardiovascular: Normal rate, regular rhythm and normal heart sounds.  Exam reveals no gallop and no friction rub.   No murmur heard. Pulmonary/Chest: Effort normal and breath sounds normal. No stridor. No respiratory distress. She has no wheezes. She has no rales. She exhibits no tenderness.  Abdominal: Soft. She exhibits no distension and no mass. There is no tenderness. There is no rebound and no guarding.  Musculoskeletal: Normal range of motion. She exhibits no edema.  Right shoulder  postoperative wound is well approximated, well-healing. Negative for erythema, swelling, or fluctuance. Positive for mild tenderness to palpation. 2+ pulses distally. Negative for deficits in sensation or motor function distally  Neurological: She is alert and oriented to person, place, and time.  Skin: Skin is warm and dry. She is not diaphoretic.    ED Course  Procedures (including critical care time) Labs Review Labs Reviewed  COMPREHENSIVE METABOLIC PANEL - Abnormal; Notable for the following:    Sodium 131 (*)    Chloride 93 (*)    Glucose, Bld 644 (*)    BUN 39 (*)    Creatinine, Ser 2.41 (*)    Alkaline Phosphatase 128 (*)    Total Bilirubin 0.2 (*)    GFR calc non Af Amer 23 (*)    GFR calc Af Amer 26 (*)    All other components within normal limits  CBG MONITORING, ED - Abnormal; Notable for the following:    Glucose-Capillary 582 (*)    All other components within  normal limits  I-STAT CHEM 8, ED - Abnormal; Notable for the following:    Sodium 131 (*)    Chloride 95 (*)    BUN 41 (*)    Creatinine, Ser 2.00 (*)    Glucose, Bld 633 (*)    All other components within normal limits  CBG MONITORING, ED - Abnormal; Notable for the following:    Glucose-Capillary 447 (*)    All other components within normal limits  I-STAT VENOUS BLOOD GAS, ED - Abnormal; Notable for the following:    pO2, Ven 23.0 (*)    Acid-base deficit 5.0 (*)    All other components within normal limits  PROTIME-INR  APTT  CBC  DIFFERENTIAL  URINALYSIS, ROUTINE W REFLEX MICROSCOPIC  BLOOD GAS, VENOUS  HEMOGLOBIN A1C  LIPID PANEL  HEMOGLOBIN A1C  SODIUM, URINE, RANDOM  CALCIUM / CREATININE RATIO, URINE  I-STAT TROPOININ, ED  CBG MONITORING, ED  CYTOLOGY - NON PAP    Imaging Review Dg Chest 2 View  02/20/2015   CLINICAL DATA:  Weakness, fatigue, LEFT shoulder pain. History of hypertension, diabetes, stroke and asthma.  EXAM: CHEST  2 VIEW  COMPARISON:  Chest radiograph May 22, 2014   FINDINGS: The cardiac silhouette appears mildly enlarged. Mediastinal silhouette is nonsuspicious giving low inspiratory AP technique. Diffuse mild interstitial prominence. No pleural effusion or focal consolidation. Elevated RIGHT hemidiaphragm. No pneumothorax.  Large body habitus. Moderate degenerative change of the thoracic spine.  IMPRESSION: Mild cardiomegaly. Diffuse mild interstitial prominence could reflect pulmonary edema or atypical infection without focal consolidation.   Electronically Signed   By: Awilda Metro   On: 02/20/2015 22:33   Ct Head (brain) Wo Contrast  02/20/2015   CLINICAL DATA:  Weakness an right-sided numbness. Previous history of stroke.  EXAM: CT HEAD WITHOUT CONTRAST  TECHNIQUE: Contiguous axial images were obtained from the base of the skull through the vertex without intravenous contrast.  COMPARISON:  Head CT 07/03/2014.  MRI 05/22/2014.  FINDINGS: There is chronic low-density in the left frontal white matter and affecting a a right frontal gyrus consistent with previously seen old infarctions. No sign of acute infarction, mass lesion, hemorrhage, hydrocephalus or extra-axial collection. No calvarial abnormality. Sinuses, middle ears and mastoids are clear.  IMPRESSION: No acute finding by CT. Old right frontal cortical infarction. Old left frontal white matter infarction.   Electronically Signed   By: Paulina Fusi M.D.   On: 02/20/2015 21:07     EKG Interpretation   Date/Time:  Saturday February 20 2015 20:17:04 EDT Ventricular Rate:  75 PR Interval:  180 QRS Duration: 92 QT Interval:  420 QTC Calculation: 469 R Axis:   -29 Text Interpretation:  Normal sinus rhythm Minimal voltage criteria for  LVH, may be normal variant Nonspecific T wave abnormality Prolonged QT  Abnormal ECG QT slightly prolonged  Confirmed by YAO  MD, DAVID ([id number]) on  02/20/2015 9:31:29 PM      MDM   Final diagnoses:  Hyperglycemia    This is a 49 year old female, with a history  of diabetes, hypertension, CVA, presenting today with fatigue. This started "days ago." Mostly at home. Worsening over the last 2 weeks, ever since she stopped taking her insulin. It became severe today, with diffuse weakness. Negative for chest pain, shortness of breath, lightheadedness, focal weakness, numbness, or tingling. Negative for head injury. Negative for fever, chills. Positive for polyuria. Positive for mucosal drying.  On examination, patient has dry mucous membranes. Vital signs are within normal  limits. Patient is afebrile. Right shoulder appears as though it is healing well, without any signs or symptoms of infection. Right upper extremity is neurovascularly intact. Patient appears to be volume down. Labs reveal significant AK I, hyperglycemia. Anion gap is not significantly increased. CO2 is within normal limits. Patient likely not acidotic. I believe the patient is suffering from AK I due to hypovolemia due to polyuria due to noncompliance with insulin regimen. I do not believe patient is suffering from DKA. We'll administer 30 mL/kg of normal saline (3 L), 10 mg of subcutaneous insulin, reevaluate blood glucose, admitted for further care.  Patient is being admitted in stable condition to the hospitalist service.  I have discussed case and care has been guided by my attending physician, Dr. Silverio Lay.  Loma Boston, MD 02/21/15 0036  Richardean Canal, MD 02/22/15 Ebony Cargo

## 2015-02-21 NOTE — Progress Notes (Signed)
Utilization review completed.  

## 2015-02-21 NOTE — Progress Notes (Signed)
Pt has an order for RN stroke swallow screen. Pt appears to be still drowsy from the sedation received prior to MRI. Pt is alert and oriented when awaken up. Charge nurse made aware and recommended to wait until pt is fully awake and alert and do RN stroke swallow screen in AM.

## 2015-02-21 NOTE — Progress Notes (Signed)
Carotid Duplex Completed. No evidence of a significant stenosis noted in the ICAs bilaterally 0-39%. Marilynne Halstedita Mayumi Summerson, BS, RDMS, RVT

## 2015-02-21 NOTE — Progress Notes (Signed)
[facility] PROGRESS NOTE  Patricia [name] QBH:[id number] DOB: 1966-05-17 DOA: 02/20/2015 PCP: [name], MD  Assessment/Plan: 1-Acute Renal failure; Cr peak to 2. 4. Suspect rel;ated to hypovolemia, hemodynamic, NSAID.  Improving with IV fluids.  Renal US negative,   2-Diabetes, uncontrolled ; Hyperglycemia, CBG at 600 on admission. Improving.  Continue with Lantus 25 units daily. SSI.   3-Neck pain, right shoulder pain; check ESR, CK, X ray of neck.  Continue with Zanaflex, oxycodone.  Carotid doppler.   4-Weakness, MRI negative for stroke.  Needs PT, OT. Hold OxyContin XR.  LDL 84.   5-History CVA; continue with aspirin, dipyridamole.  6-HTN; continue with nebivolol.   Code Status: full code.  Family Communication: care discussed with daughter Disposition Plan: remain inpatient, home probably 5-2   Consultants:  none  Procedures:  Carotid doppler.   Antibiotics:  none  HPI/Subjective: Complaining of neck pain right shoulder pain since she had sx on her left shoulder.    Objective: Filed Vitals:   02/21/15 0424  BP: 122/78  Pulse: 73  Temp: 98 F (36.7 C)  Resp: 8    Intake/Output Summary (Last 24 hours) at 02/21/15 0901 Last data filed at 02/21/15 0900  Gross per 24 hour  Intake   3840 ml  Output    800 ml  Net   3040 ml   Filed Weights   02/21/15 0000 02/21/15 0500  Weight: 103.148 kg (227 lb 6.4 oz) 103.14 kg (227 lb 6.1 oz)    Exam:   General:  Alert in no distress. In pain.   Cardiovascular: S 1, S 2 RRR  Respiratory: CTA  Abdomen: BS present, soft,   Musculoskeletal: no edema   Data Reviewed: Basic Metabolic Panel:  Recent Labs Lab 02/20/15 2025 02/20/15 2034  NA 131* 131*  K 3.5 3.5  CL 93* 95*  CO2 25  --   GLUCOSE 644* 633*  BUN 39* 41*  CREATININE 2.41* 2.00*  CALCIUM 9.1  --    Liver Function Tests:  Recent Labs Lab 02/20/15 2025  AST 18  ALT 15  ALKPHOS 128*  BILITOT 0.2*  PROT 7.4   ALBUMIN 3.5   No results for input(s): LIPASE, AMYLASE in the last 168 hours. No results for input(s): AMMONIA in the last 168 hours. CBC:  Recent Labs Lab 02/20/15 2025 02/20/15 2034 02/21/15 0715  WBC 8.1  --  7.0  NEUTROABS 5.4  --   --   HGB 13.1 14.6 11.7*  HCT 39.9 43.0 35.9*  MCV 87.1  --  86.1  PLT 239  --  217   Cardiac Enzymes: No results for input(s): CKTOTAL, CKMB, CKMBINDEX, TROPONINI in the last 168 hours. BNP (last 3 results) No results for input(s): BNP in the last 8760 hours.  ProBNP (last 3 results) No results for input(s): PROBNP in the last 8760 hours.  CBG:  Recent Labs Lab 02/20/15 2014 02/20/15 2250 02/20/15 2359 02/21/15 0413  GLUCAP 582* 447* 239* 104*    No results found for this or any previous visit (from the past 240 hour(s)).   Studies: Dg Chest 2 View  02/20/2015   CLINICAL DATA:  Weakness, fatigue, LEFT shoulder pain. History of hypertension, diabetes, stroke and asthma.  EXAM: CHEST  2 VIEW  COMPARISON:  Chest radiograph May 22, 2014  FINDINGS: The cardiac silhouette appears mildly enlarged. Mediastinal silhouette is nonsuspicious giving low inspiratory AP technique. Diffuse mild interstitial prominence. No pleural effusion or focal consolidation. Elevated RIGHT hemidiaphragm. No pneumothorax.  Large body habitus. Moderate degenerative change of the thoracic spine.  IMPRESSION: Mild cardiomegaly. Diffuse mild interstitial prominence could reflect pulmonary edema or atypical infection without focal consolidation.   Electronically Signed   By: Elon Alas   On: 02/20/2015 22:33   Ct Head (brain) Wo Contrast  02/20/2015   CLINICAL DATA:  Weakness an right-sided numbness. Previous history of stroke.  EXAM: CT HEAD WITHOUT CONTRAST  TECHNIQUE: Contiguous axial images were obtained from the base of the skull through the vertex without intravenous contrast.  COMPARISON:  Head CT 07/03/2014.  MRI 05/22/2014.  FINDINGS: There is chronic  low-density in the left frontal white matter and affecting a a right frontal gyrus consistent with previously seen old infarctions. No sign of acute infarction, mass lesion, hemorrhage, hydrocephalus or extra-axial collection. No calvarial abnormality. Sinuses, middle ears and mastoids are clear.  IMPRESSION: No acute finding by CT. Old right frontal cortical infarction. Old left frontal white matter infarction.   Electronically Signed   By: Nelson Chimes M.D.   On: 02/20/2015 21:07   Mr Brain Wo Contrast  02/21/2015   CLINICAL DATA:  RIGHT foot weakness for 1 hour at 6 p.m. last night. Mild dizziness. Hyperglycemia. History of hypertension, stroke, diabetes, headache.  EXAM: MRI HEAD WITHOUT CONTRAST  MRA HEAD WITHOUT CONTRAST  TECHNIQUE: Multiplanar, multiecho pulse sequences of the brain and surrounding structures were obtained without intravenous contrast. Angiographic images of the head were obtained using MRA technique without contrast.  COMPARISON:  CT of the head February 20, 2015 and MRI /MRA of the head May 22, 2015  FINDINGS: MRI HEAD FINDINGS  The ventricles and sulci are normal for patient's age. No reduced diffusion to suggest acute ischemia. A few scattered subcentimeter foci of susceptibility artifact in the periphery of the cerebrum. Patchy to supratentorial white matter FLAIR T2 hyperintensities. Additional small areas of encephalomalacia, corresponding to prior infarct. Remote bilateral basal ganglia and thalamus tiny lacunar infarcts were present previously. Focal T2 bright signal within the anterior genu of the splenium consistent with remote ischemia. No midline shift, mass effect or mass lesions.  No abnormal extra-axial fluid collections. No extra-axial masses though, contrast enhanced sequences would be more sensitive. Normal major intracranial vascular flow voids seen at the skull base.  Ocular globes and orbital contents are unremarkable though not tailored for evaluation. No abnormal  sellar expansion. Visualized paranasal sinuses and mastoid air cells are well-aerated. No suspicious calvarial bone marrow signal. No abnormal sellar expansion. Craniocervical junction maintained.  MRA HEAD FINDINGS  Anterior circulation: Normal flow related enhancement of the included cervical, petrous, cavernous and supra clinoid internal carotid arteries. Patent anterior communicating artery. Chronically occluded RIGHT anterior cerebral artery, thready irregular LEFT anterior cerebral artery is patent, unchanged. Focal high-grade stenosis LEFT M1 segment was present previously. Mild stenosis RIGHT M1 segment.  No large vessel occlusion, high-grade stenosis, abnormal luminal irregularity, aneurysm.  Posterior circulation: Codominant vertebral artery. Basilar artery is patent, though the ready in irregular flow related enhancement this appears worse than prior imaging though, partially limited by motion artifact. With normal flow related enhancement of the main branch vessels. Normal flow related enhancement of the posterior cerebral arteries. Mild luminal regularity of the RIGHT P2 segment. Moderate, worse stenosis LEFT P2 segment  No large vessel occlusion, high-grade stenosis, aneurysm.  IMPRESSION: MRI HEAD: No acute intracranial process, specifically no acute infarct.  Multifocal small areas of encephalomalacia corresponding to prior LEFT cerebral remote infarcts. Remote bilateral basal ganglia and thalamus lacunar infarcts.  Moderate white matter changes suggest chronic small vessel ischemic disease.  MRA HEAD: Focal high-grade stenosis of LEFT M1 segment, stable.  Chronically occluded RIGHT anterior cerebral artery, with thready irregular LEFT anterior cerebral artery, unchanged.  Thready, somewhat narrowed basilar artery, worse than prior examination though this may be accentuated by motion artifact. Moderate stenosis LEFT P2 segment, worse than prior imaging.  Improved appearance of the LEFT middle cerebral  artery from prior imaging, suggesting prior vasculitis.   Electronically Signed   By: Elon Alas   On: 02/21/2015 04:13   Mr Jodene Nam Head/brain Wo Cm  02/21/2015   CLINICAL DATA:  RIGHT foot weakness for 1 hour at 6 p.m. last night. Mild dizziness. Hyperglycemia. History of hypertension, stroke, diabetes, headache.  EXAM: MRI HEAD WITHOUT CONTRAST  MRA HEAD WITHOUT CONTRAST  TECHNIQUE: Multiplanar, multiecho pulse sequences of the brain and surrounding structures were obtained without intravenous contrast. Angiographic images of the head were obtained using MRA technique without contrast.  COMPARISON:  CT of the head February 20, 2015 and MRI /MRA of the head May 22, 2015  FINDINGS: MRI HEAD FINDINGS  The ventricles and sulci are normal for patient's age. No reduced diffusion to suggest acute ischemia. A few scattered subcentimeter foci of susceptibility artifact in the periphery of the cerebrum. Patchy to supratentorial white matter FLAIR T2 hyperintensities. Additional small areas of encephalomalacia, corresponding to prior infarct. Remote bilateral basal ganglia and thalamus tiny lacunar infarcts were present previously. Focal T2 bright signal within the anterior genu of the splenium consistent with remote ischemia. No midline shift, mass effect or mass lesions.  No abnormal extra-axial fluid collections. No extra-axial masses though, contrast enhanced sequences would be more sensitive. Normal major intracranial vascular flow voids seen at the skull base.  Ocular globes and orbital contents are unremarkable though not tailored for evaluation. No abnormal sellar expansion. Visualized paranasal sinuses and mastoid air cells are well-aerated. No suspicious calvarial bone marrow signal. No abnormal sellar expansion. Craniocervical junction maintained.  MRA HEAD FINDINGS  Anterior circulation: Normal flow related enhancement of the included cervical, petrous, cavernous and supra clinoid internal carotid arteries.  Patent anterior communicating artery. Chronically occluded RIGHT anterior cerebral artery, thready irregular LEFT anterior cerebral artery is patent, unchanged. Focal high-grade stenosis LEFT M1 segment was present previously. Mild stenosis RIGHT M1 segment.  No large vessel occlusion, high-grade stenosis, abnormal luminal irregularity, aneurysm.  Posterior circulation: Codominant vertebral artery. Basilar artery is patent, though the ready in irregular flow related enhancement this appears worse than prior imaging though, partially limited by motion artifact. With normal flow related enhancement of the main branch vessels. Normal flow related enhancement of the posterior cerebral arteries. Mild luminal regularity of the RIGHT P2 segment. Moderate, worse stenosis LEFT P2 segment  No large vessel occlusion, high-grade stenosis, aneurysm.  IMPRESSION: MRI HEAD: No acute intracranial process, specifically no acute infarct.  Multifocal small areas of encephalomalacia corresponding to prior LEFT cerebral remote infarcts. Remote bilateral basal ganglia and thalamus lacunar infarcts. Moderate white matter changes suggest chronic small vessel ischemic disease.  MRA HEAD: Focal high-grade stenosis of LEFT M1 segment, stable.  Chronically occluded RIGHT anterior cerebral artery, with thready irregular LEFT anterior cerebral artery, unchanged.  Thready, somewhat narrowed basilar artery, worse than prior examination though this may be accentuated by motion artifact. Moderate stenosis LEFT P2 segment, worse than prior imaging.  Improved appearance of the LEFT middle cerebral artery from prior imaging, suggesting prior vasculitis.   Electronically Signed   By: Sandie Ano  Bloomer   On: 02/21/2015 04:13    Scheduled Meds: . aspirin EC  325 mg Oral QHS  . dipyridamole  75 mg Oral TID  . enoxaparin (LOVENOX) injection  40 mg Subcutaneous Daily  . insulin aspart  0-9 Units Subcutaneous TID WC  . insulin glargine  25 Units  Subcutaneous QHS  . nebivolol  10 mg Oral QHS  . [START ON 02/23/2015] rosuvastatin  10 mg Oral Once per day on Tue Fri  . sodium chloride  3 mL Intravenous Q12H   Continuous Infusions: . sodium chloride      Active Problems:   AKI (acute kidney injury)   ARF (acute renal failure)    Time spent: 35 minutes.     Niel Hummer A  [facility] Pager [phone]. If 7PM-7AM, please contact night-coverage at [email or web address], password [facility] 02/21/2015, 9:01 AM

## 2015-02-21 NOTE — Evaluation (Signed)
Physical Therapy Evaluation Patient Details Name: Patricia [name] MRN: [id number] DOB: 09/01/1966 Today's Date: 02/21/2015   History of Present Illness  Admitted with weakness, hyperglycemia; imaging negative for CVA; recent R rotator cuff repair  Clinical Impression  Pt admitted with above diagnosis. Pt currently with functional limitations due to the deficits listed below (see PT Problem List).  Pt will benefit from skilled PT to increase their independence and safety with mobility to allow discharge to the venue listed below.  In spite of significant shoulder and neck pain, she is overall walking well.  Is there any chance Dr. August Saucerean can see her while she is in the hospital?  Pt's R shoulder and neck pain will be best addressed at Ortho follow-up with Dr. August Saucerean; I encouraged her to pursue Outpatient PT for bil shoulders and neck      Follow Up Recommendations Outpatient PT    Equipment Recommendations  None recommended by PT    Recommendations for Other Services       Precautions / Restrictions Precautions Precautions: Shoulder Precaution Comments: Unsure how long pt remains on passive protocol postop; Will ask OT Restrictions RUE Weight Bearing: Non weight bearing      Mobility  Bed Mobility Overal bed mobility: Independent                Transfers Overall transfer level: Independent                  Ambulation/Gait Ambulation/Gait assistance: Supervision Ambulation Distance (Feet): 200 Feet Assistive device: None (pushing IV pole) Gait Pattern/deviations: Decreased stride length     General Gait Details: slow pace; decr arm swing bilaterally, stiff upper trunk; guarding R shoudler due to surgery; bulk of pain is in her L neck and shoulder  Stairs            Wheelchair Mobility    Modified Rankin (Stroke Patients Only)       Balance Overall balance assessment: No apparent balance deficits (not formally assessed)                                            Pertinent Vitals/Pain Pain Assessment: Faces Faces Pain Scale: Hurts worst Pain Location: L neck and shoulder; R shoulder Pain Descriptors / Indicators: Aching;Crying;Grimacing;Guarding Pain Intervention(s): Limited activity within patient's tolerance;Monitored during session;Patient requesting pain meds-RN notified;Ice applied    Home Living Family/patient expects to be discharged to:: Private residence Living Arrangements: Children Available Help at Discharge: Family;Available 24 hours/day Type of Home: House Home Access: Stairs to enter Entrance Stairs-Rails: None Entrance Stairs-Number of Steps: 3 Home Layout: Able to live on main level with bedroom/bathroom Home Equipment: None      Prior Function Level of Independence: Independent         Comments: Patient drives and works as Clinical biochemistCMA.     Hand Dominance   Dominant Hand: Right    Extremity/Trunk Assessment   Upper Extremity Assessment: RUE deficits/detail RUE Deficits / Details: Avoided movement R UE until further info available re: protocol 2 wks postop Rotator cuff repair          Lower Extremity Assessment: Overall WFL for tasks assessed      Cervical / Trunk Assessment: Other exceptions  Communication   Communication: No difficulties  Cognition Arousal/Alertness: Awake/alert Behavior During Therapy: WFL for tasks assessed/performed Overall Cognitive Status: Within Functional Limits for tasks  assessed       Memory:  (Pt unsure of her precautions for the R shoulder)              General Comments      Exercises        Assessment/Plan    PT Assessment Patient needs continued PT services  PT Diagnosis Acute pain   PT Problem List Decreased range of motion;Decreased activity tolerance;Pain  PT Treatment Interventions DME instruction;Gait training;Stair training;Functional mobility training;Therapeutic activities;Therapeutic exercise;Patient/family  education   PT Goals (Current goals can be found in the Care Plan section) Acute Rehab PT Goals Patient Stated Goal: chase grandchildren PT Goal Formulation: With patient Time For Goal Achievement: 02/28/15 Potential to Achieve Goals: Good    Frequency Min 3X/week   Barriers to discharge        Co-evaluation               End of Session   Activity Tolerance: Patient tolerated treatment well Patient left: in chair;with call bell/phone within reach Nurse Communication: Mobility status;Patient requests pain meds    Functional Assessment Tool Used: Clinical Judgement Functional Limitation: Mobility: Walking and moving around Mobility: Walking and Moving Around Current Status [phone]): At least 1 percent but less than 20 percent impaired, limited or restricted Mobility: Walking and Moving Around Goal Status [phone]): 0 percent impaired, limited or restricted    Time: 0981-1914 PT Time Calculation (min) (ACUTE ONLY): 24 min   Charges:   PT Evaluation $Initial PT Evaluation Tier I: 1 Procedure PT Treatments $Gait Training: 8-22 mins   PT G Codes:   PT G-Codes **NOT FOR INPATIENT CLASS** Functional Assessment Tool Used: Clinical Judgement Functional Limitation: Mobility: Walking and moving around Mobility: Walking and Moving Around Current Status ([id number]): At least 1 percent but less than 20 percent impaired, limited or restricted Mobility: Walking and Moving Around Goal Status [phone]): 0 percent impaired, limited or restricted    Van Clines Hamff 02/21/2015, 2:08 PM  Van Clines, PT  Acute Rehabilitation Services Pager [phone] Office [phone]

## 2015-02-21 NOTE — Progress Notes (Addendum)
New Admission Note:   Arrival Method: per stretcher from ED with NT Tinisha and daughter Angelique BlonderDenise Mental Orientation: alert, oriented X4, conversant Telemetry: placed on telebox 6E04 after CCMD notified Assessment: Completed Skin: warm, dry, intact with surgical incision on the right shoulder, closed, dry, no signs of infection IV: G20 on the right AC with transparent dressing, clean, dry and intact Pain: 8/10 scale on the right shoulder, described it as numbness and discomfort. Will administer PRN pain med as ordered. Tubes: none Safety Measures: Safety Fall Prevention Plan has been given, discussed and signed Admission: Completed [address] [place] Orientation: Patient has been orientated to the room, unit and staff.  Family: daughter Angelique BlonderDenise at bedside  Orders have been reviewed and implemented. Will continue to monitor the patient. Call light has been placed within reach and bed alarm has been activated.   Janice NorrieAnessa Tahmid Stonehocker BSN, RN [facility] [place] [phone]

## 2015-02-22 DIAGNOSIS — G459 Transient cerebral ischemic attack, unspecified: Secondary | ICD-10-CM

## 2015-02-22 DIAGNOSIS — N179 Acute kidney failure, unspecified: Secondary | ICD-10-CM | POA: Diagnosis not present

## 2015-02-22 LAB — HEMOGLOBIN A1C
HEMOGLOBIN A1C: 8.5 % — AB (ref 4.8–5.6)
Mean Plasma Glucose: 197 mg/dL

## 2015-02-22 LAB — CALCIUM / CREATININE RATIO, URINE
Calcium, Ur: <0.8 mg/dL
Calcium/Creat.Ratio: <11 mg/g creat (ref 0–260)
Creatinine, Urine: 75.8 mg/dL (ref 15.0–278.0)

## 2015-02-22 LAB — GLUCOSE, CAPILLARY: GLUCOSE-CAPILLARY: 90 mg/dL (ref 70–99)

## 2015-02-22 MED ORDER — POTASSIUM CHLORIDE CRYS ER 20 MEQ PO TBCR: 40.0000 meq | EXTENDED_RELEASE_TABLET | Freq: Once | ORAL | Status: DC

## 2015-02-22 MED ORDER — INSULIN DEGLUDEC 100 UNIT/ML ~~LOC~~ SOPN: 24.0000 [IU] | PEN_INJECTOR | Freq: Every evening | SUBCUTANEOUS | Status: DC | PRN

## 2015-02-22 NOTE — Care Management Note (Signed)
Case Management Note  Patient Details  Name: Patricia OarDutchess D Mormile MRN: [id number] Date of Birth: 17-Sep-1966  Subjective/Objective:                  DM  Action/Plan: Lives at with dtr  Expected Discharge Date:  02/22/15               Expected Discharge Plan:  Home/Self Care  In-House Referral:     Discharge planning Services  CM Consult   Status of Service:  Completed, signed off  Medicare Important Message Given:   No Date Medicare IM Given:    Medicare IM give by:    Date Additional Medicare IM Given:    Additional Medicare Important Message give by:     If discussed at Long Length of Stay Meetings, dates discussed:    Additional Comments:  NCM spoke to and states she is able to afford medications. States she has a referral for outpt PT for her shoulder. Pt is s/p right rotator cuff surgery. Isidoro DonningAlesia Sayuri Rhames RN CCM Case Mgmt Isidoro DonningAlesia Braelon Sprung RN CCM Case Mgmt phone [phone]  Elliot CousinShavis, Shardae Kleinman Ellen, RN 02/22/2015, 10:58 AM

## 2015-02-22 NOTE — Discharge Summary (Signed)
Physician Discharge Summary  AMALIA EDGECOMBE AVW:[id number] DOB: 1966/05/21 DOA: 02/20/2015  PCP: Ralene Ok, MD  Admit date: 02/20/2015 Discharge date: 02/22/2015  Time spent: 35 minutes  Recommendations for Outpatient Follow-up:  1. Needs Bmet to follow renal function.  2. Adjust diabetes medications.  3. Needs to follow up with primary orthopaedic fro neck , shoulder pain.   Discharge Diagnoses:    AKI (acute kidney injury)   Diabetes, uncontrolled, hyperglycemia/    Neck , shoulder pain.    Discharge Condition: stable.   Diet recommendation: Carb modified.   Filed Weights   02/21/15 0000 02/21/15 0500 02/21/15 2118  Weight: 103.148 kg (227 lb 6.4 oz) 103.14 kg (227 lb 6.1 oz) 104.7 kg (230 lb 13.2 oz)    History of present illness:  Complaint: weak, and slight dragging of her right ft HPI: 49 yo female with htn, dm2, cva apparently c/o slight generalized weakness and then some slight dragging of her right foot. Pt's symptoms started 6 pm today. The foot weakness lasted for about 1 hour. Pt states that other than weakness, slight dizziness. Pt was evaluated in ED and found to have hyperglycemia bs 644. CT scan brain negative. CXR showed interstitial prominence. But pt has no symptoms of dyspnea. Pt has recently been placed on ibuprofen, and noted to have ARF. Bun 39, creatinine 2.41 Pt will be admitted for ARF, and r/o CVA but most likely her symptoms were probably caused by hyperglycemia.   Hospital Course:  1-Acute Renal failure; Cr peak to 2. 4. Suspect rel;ated to hypovolemia, hemodynamic, NSAID.  Improving with IV fluids. cr decrease to 1.4. Advised to avoid NSAIDS.  Hold chlorthalidone at discharge  Renal US negative,   2-Diabetes, uncontrolled ; Hyperglycemia, CBG at 600 on admission. Improving.  Continue with Lantus 24 units daily. SSI.  Hold metformin at discharge   3-Neck pain, right shoulder pain;  Continue with Zanaflex, oxycodone.  Carotid  doppler no significant stenosis.  X ray with degenerative diseases. Needs to follow up with primary orthopedic. Marland Kitchen   4-Weakness, MRI negative for stroke.  Needs PT, OT. Hold OxyContin XR.  LDL 84.  Resolved, suspect related to sedative.   5-History CVA; continue with aspirin, dipyridamole.  6-HTN; continue with nebivolol. Hold diuretics at discharge due to renal failure.   Procedures:  none  Consultations:  none  Discharge Exam: Filed Vitals:   02/22/15 0535  BP: 132/78  Pulse: 70  Temp: 98.9 F (37.2 C)  Resp: 17    General: Alert in no distress.  Cardiovascular: S 1, S 2 RRR Respiratory: CTA  Discharge Instructions   Discharge Instructions    Diet Carb Modified    Complete by:  As directed      Increase activity slowly    Complete by:  As directed           Current Discharge Medication List    CONTINUE these medications which have CHANGED   Details  Insulin Degludec 100 UNIT/ML SOPN Inject 24 Units into the skin at bedtime as needed (CBG>171). TRESIBA Qty: 10 pen, Refills: 0      CONTINUE these medications which have NOT CHANGED   Details  aspirin 325 MG tablet Take 325 mg by mouth daily.    clotrimazole-betamethasone (LOTRISONE) cream Apply 1 application topically daily as needed. Skin irritation Refills: 0    dipyridamole (PERSANTINE) 75 MG tablet Take 75 mg by mouth 3 (three) times daily.  Refills: 3    fluconazole (DIFLUCAN) 150 MG tablet  Take 150 mg by mouth daily as needed (for yeast infections).    Nebivolol HCl 20 MG TABS Take 10 mg by mouth at bedtime. BYSTOLIC    oxyCODONE (OXY IR/ROXICODONE) 5 MG immediate release tablet Take 1-2 tablets (5-10 mg total) by mouth every 3 (three) hours as needed for breakthrough pain. Qty: 60 tablet, Refills: 0    promethazine (PHENERGAN) 25 MG tablet Take 25 mg by mouth 3 (three) times daily as needed for nausea or vomiting.     rosuvastatin (CRESTOR) 10 MG tablet Take 10 mg by mouth 2 (two) times a  week. Usually Tuesdays and Fridays at night   Associated Diagnoses: Acute CVA (cerebrovascular accident)    tiZANidine (ZANAFLEX) 4 MG tablet Take 4 mg by mouth every 8 (eight) hours as needed (migraines).       STOP taking these medications     canagliflozin (INVOKANA) 300 MG TABS tablet      chlorthalidone (HYGROTON) 25 MG tablet      methocarbamol (ROBAXIN) 500 MG tablet      OxyCODONE (OXYCONTIN) 10 mg T12A 12 hr tablet      sitaGLIPtin-metformin (JANUMET) 50-1000 MG per tablet      aspirin EC 81 MG tablet        Allergies  Allergen Reactions  . Imitrex [Sumatriptan] Anaphylaxis  . Plavix [Clopidogrel Bisulfate] Hives  . Lipitor [Atorvastatin] Rash   Follow-up Information    Follow up with Ralene Ok, MD.   Specialty:  Internal Medicine   Contact information:   [address] [facility] [address] [phone]        The results of significant diagnostics from this hospitalization (including imaging, microbiology, ancillary and laboratory) are listed below for reference.    Significant Diagnostic Studies: Dg Chest 2 View  02/20/2015   CLINICAL DATA:  Weakness, fatigue, LEFT shoulder pain. History of hypertension, diabetes, stroke and asthma.  EXAM: CHEST  2 VIEW  COMPARISON:  Chest radiograph May 22, 2014  FINDINGS: The cardiac silhouette appears mildly enlarged. Mediastinal silhouette is nonsuspicious giving low inspiratory AP technique. Diffuse mild interstitial prominence. No pleural effusion or focal consolidation. Elevated RIGHT hemidiaphragm. No pneumothorax.  Large body habitus. Moderate degenerative change of the thoracic spine.  IMPRESSION: Mild cardiomegaly. Diffuse mild interstitial prominence could reflect pulmonary edema or atypical infection without focal consolidation.   Electronically Signed   By: Awilda Metro   On: 02/20/2015 22:33   Dg Cervical Spine 2 Or 3 Views  02/21/2015   CLINICAL DATA:  Left-sided neck pain.  No known injury.  EXAM:  CERVICAL SPINE - 2-3 VIEW  COMPARISON:  08/26/2010  FINDINGS: No evidence of acute cervical spine fracture, subluxation, or prevertebral soft tissue swelling.  Mild degenerative disc disease shows interval progression since previous study at levels of C5-6 and C6-7. Mild bilateral upper cervical facet DJD again noted, but suboptimally visualized on this study which does not include oblique radiographs. No other significant bone abnormality identified.  IMPRESSION: No acute findings.  Interval progression of mild degenerative disc disease at C5-6 and C6-7. Bilateral upper cervical facet DJD also noted.   Electronically Signed   By: Myles Rosenthal M.D.   On: 02/21/2015 13:17   Ct Head (brain) Wo Contrast  02/20/2015   CLINICAL DATA:  Weakness an right-sided numbness. Previous history of stroke.  EXAM: CT HEAD WITHOUT CONTRAST  TECHNIQUE: Contiguous axial images were obtained from the base of the skull through the vertex without intravenous contrast.  COMPARISON:  Head CT 07/03/2014.  MRI 05/22/2014.  FINDINGS: There is chronic low-density in the left frontal white matter and affecting a a right frontal gyrus consistent with previously seen old infarctions. No sign of acute infarction, mass lesion, hemorrhage, hydrocephalus or extra-axial collection. No calvarial abnormality. Sinuses, middle ears and mastoids are clear.  IMPRESSION: No acute finding by CT. Old right frontal cortical infarction. Old left frontal white matter infarction.   Electronically Signed   By: Paulina FusiMark  Shogry M.D.   On: 02/20/2015 21:07   Mr Brain Wo Contrast  02/21/2015   CLINICAL DATA:  RIGHT foot weakness for 1 hour at 6 p.m. last night. Mild dizziness. Hyperglycemia. History of hypertension, stroke, diabetes, headache.  EXAM: MRI HEAD WITHOUT CONTRAST  MRA HEAD WITHOUT CONTRAST  TECHNIQUE: Multiplanar, multiecho pulse sequences of the brain and surrounding structures were obtained without intravenous contrast. Angiographic images of the head  were obtained using MRA technique without contrast.  COMPARISON:  CT of the head February 20, 2015 and MRI /MRA of the head May 22, 2015  FINDINGS: MRI HEAD FINDINGS  The ventricles and sulci are normal for patient's age. No reduced diffusion to suggest acute ischemia. A few scattered subcentimeter foci of susceptibility artifact in the periphery of the cerebrum. Patchy to supratentorial white matter FLAIR T2 hyperintensities. Additional small areas of encephalomalacia, corresponding to prior infarct. Remote bilateral basal ganglia and thalamus tiny lacunar infarcts were present previously. Focal T2 bright signal within the anterior genu of the splenium consistent with remote ischemia. No midline shift, mass effect or mass lesions.  No abnormal extra-axial fluid collections. No extra-axial masses though, contrast enhanced sequences would be more sensitive. Normal major intracranial vascular flow voids seen at the skull base.  Ocular globes and orbital contents are unremarkable though not tailored for evaluation. No abnormal sellar expansion. Visualized paranasal sinuses and mastoid air cells are well-aerated. No suspicious calvarial bone marrow signal. No abnormal sellar expansion. Craniocervical junction maintained.  MRA HEAD FINDINGS  Anterior circulation: Normal flow related enhancement of the included cervical, petrous, cavernous and supra clinoid internal carotid arteries. Patent anterior communicating artery. Chronically occluded RIGHT anterior cerebral artery, thready irregular LEFT anterior cerebral artery is patent, unchanged. Focal high-grade stenosis LEFT M1 segment was present previously. Mild stenosis RIGHT M1 segment.  No large vessel occlusion, high-grade stenosis, abnormal luminal irregularity, aneurysm.  Posterior circulation: Codominant vertebral artery. Basilar artery is patent, though the ready in irregular flow related enhancement this appears worse than prior imaging though, partially limited by  motion artifact. With normal flow related enhancement of the main branch vessels. Normal flow related enhancement of the posterior cerebral arteries. Mild luminal regularity of the RIGHT P2 segment. Moderate, worse stenosis LEFT P2 segment  No large vessel occlusion, high-grade stenosis, aneurysm.  IMPRESSION: MRI HEAD: No acute intracranial process, specifically no acute infarct.  Multifocal small areas of encephalomalacia corresponding to prior LEFT cerebral remote infarcts. Remote bilateral basal ganglia and thalamus lacunar infarcts. Moderate white matter changes suggest chronic small vessel ischemic disease.  MRA HEAD: Focal high-grade stenosis of LEFT M1 segment, stable.  Chronically occluded RIGHT anterior cerebral artery, with thready irregular LEFT anterior cerebral artery, unchanged.  Thready, somewhat narrowed basilar artery, worse than prior examination though this may be accentuated by motion artifact. Moderate stenosis LEFT P2 segment, worse than prior imaging.  Improved appearance of the LEFT middle cerebral artery from prior imaging, suggesting prior vasculitis.   Electronically Signed   By: Awilda Metroourtnay  Bloomer   On: 02/21/2015 04:13   [place] Renal  02/21/2015   CLINICAL DATA:  Acute renal failure. Acute kidney injury. Diabetes and hypertension.  EXAM: RENAL / URINARY TRACT ULTRASOUND COMPLETE  COMPARISON:  Abdominal ultrasound on 06/23/2014  FINDINGS: Right Kidney:  Length: 11.1 cm. Echogenicity within normal limits. No mass or hydronephrosis visualized.  Left Kidney:  Length: 10.6 cm. Echogenicity within normal limits. No mass or hydronephrosis visualized.  Bladder:  Appears normal for degree of bladder distention.  IMPRESSION: Negative. No evidence of hydronephrosis or other sonographic abnormality.   Electronically Signed   By: Myles Rosenthal M.D.   On: 02/21/2015 09:53   Mr Maxine Glenn Head/brain Wo Cm  02/21/2015   CLINICAL DATA:  RIGHT foot weakness for 1 hour at 6 p.m. last night. Mild dizziness.  Hyperglycemia. History of hypertension, stroke, diabetes, headache.  EXAM: MRI HEAD WITHOUT CONTRAST  MRA HEAD WITHOUT CONTRAST  TECHNIQUE: Multiplanar, multiecho pulse sequences of the brain and surrounding structures were obtained without intravenous contrast. Angiographic images of the head were obtained using MRA technique without contrast.  COMPARISON:  CT of the head February 20, 2015 and MRI /MRA of the head May 22, 2015  FINDINGS: MRI HEAD FINDINGS  The ventricles and sulci are normal for patient's age. No reduced diffusion to suggest acute ischemia. A few scattered subcentimeter foci of susceptibility artifact in the periphery of the cerebrum. Patchy to supratentorial white matter FLAIR T2 hyperintensities. Additional small areas of encephalomalacia, corresponding to prior infarct. Remote bilateral basal ganglia and thalamus tiny lacunar infarcts were present previously. Focal T2 bright signal within the anterior genu of the splenium consistent with remote ischemia. No midline shift, mass effect or mass lesions.  No abnormal extra-axial fluid collections. No extra-axial masses though, contrast enhanced sequences would be more sensitive. Normal major intracranial vascular flow voids seen at the skull base.  Ocular globes and orbital contents are unremarkable though not tailored for evaluation. No abnormal sellar expansion. Visualized paranasal sinuses and mastoid air cells are well-aerated. No suspicious calvarial bone marrow signal. No abnormal sellar expansion. Craniocervical junction maintained.  MRA HEAD FINDINGS  Anterior circulation: Normal flow related enhancement of the included cervical, petrous, cavernous and supra clinoid internal carotid arteries. Patent anterior communicating artery. Chronically occluded RIGHT anterior cerebral artery, thready irregular LEFT anterior cerebral artery is patent, unchanged. Focal high-grade stenosis LEFT M1 segment was present previously. Mild stenosis RIGHT M1  segment.  No large vessel occlusion, high-grade stenosis, abnormal luminal irregularity, aneurysm.  Posterior circulation: Codominant vertebral artery. Basilar artery is patent, though the ready in irregular flow related enhancement this appears worse than prior imaging though, partially limited by motion artifact. With normal flow related enhancement of the main branch vessels. Normal flow related enhancement of the posterior cerebral arteries. Mild luminal regularity of the RIGHT P2 segment. Moderate, worse stenosis LEFT P2 segment  No large vessel occlusion, high-grade stenosis, aneurysm.  IMPRESSION: MRI HEAD: No acute intracranial process, specifically no acute infarct.  Multifocal small areas of encephalomalacia corresponding to prior LEFT cerebral remote infarcts. Remote bilateral basal ganglia and thalamus lacunar infarcts. Moderate white matter changes suggest chronic small vessel ischemic disease.  MRA HEAD: Focal high-grade stenosis of LEFT M1 segment, stable.  Chronically occluded RIGHT anterior cerebral artery, with thready irregular LEFT anterior cerebral artery, unchanged.  Thready, somewhat narrowed basilar artery, worse than prior examination though this may be accentuated by motion artifact. Moderate stenosis LEFT P2 segment, worse than prior imaging.  Improved appearance of the LEFT middle cerebral artery from prior imaging, suggesting prior vasculitis.  Electronically Signed   By: Awilda Metro   On: 02/21/2015 04:13    Microbiology: No results found for this or any previous visit (from the past 240 hour(s)).   Labs: Basic Metabolic Panel:  Recent Labs Lab 02/20/15 2025 02/20/15 2034 02/21/15 0715  NA 131* 131* 139  K 3.5 3.5 3.0*  CL 93* 95* 107  CO2 25  --  24  GLUCOSE 644* 633* 104*  BUN 39* 41* 31*  CREATININE 2.41* 2.00* 1.41*  CALCIUM 9.1  --  8.5*   Liver Function Tests:  Recent Labs Lab 02/20/15 2025  AST 18  ALT 15  ALKPHOS 128*  BILITOT 0.2*  PROT  7.4  ALBUMIN 3.5   No results for input(s): LIPASE, AMYLASE in the last 168 hours. No results for input(s): AMMONIA in the last 168 hours. CBC:  Recent Labs Lab 02/20/15 2025 02/20/15 2034 02/21/15 0715  WBC 8.1  --  7.0  NEUTROABS 5.4  --   --   HGB 13.1 14.6 11.7*  HCT 39.9 43.0 35.9*  MCV 87.1  --  86.1  PLT 239  --  217   Cardiac Enzymes:  Recent Labs Lab 02/21/15 1330  CKTOTAL 108   BNP: BNP (last 3 results) No results for input(s): BNP in the last 8760 hours.  ProBNP (last 3 results) No results for input(s): PROBNP in the last 8760 hours.  CBG:  Recent Labs Lab 02/21/15 0908 02/21/15 1226 02/21/15 1708 02/21/15 2118 02/22/15 0752  GLUCAP 131* 120* 230* 197* 90       Signed:  Maverik Foot A  [facility] 02/22/2015, 10:47 AM

## 2015-02-22 NOTE — Evaluation (Signed)
SLP Cancellation Note  Patient Details Name: Patricia [name] MRN: [id number] DOB: 1966/06/21   Cancelled treatment:       Reason Eval/Treat Not Completed: SLP screened, no needs identified, will sign off  Imaging studies negative and pt reports back to baseline.    Donavan Burnetamara Kushi Kun, MS [facility] SLP [phone]

## 2015-02-22 NOTE — Progress Notes (Signed)
Discharge instructions and medications discussed with patient.  Prescription given to patient.  All questions answered.  

## 2015-04-30 DIAGNOSIS — G459 Transient cerebral ischemic attack, unspecified: Secondary | ICD-10-CM | POA: Insufficient documentation

## 2015-06-22 ENCOUNTER — Other Ambulatory Visit: Payer: Self-pay

## 2015-06-22 DIAGNOSIS — Z1231 Encounter for screening mammogram for malignant neoplasm of breast: Secondary | ICD-10-CM

## 2015-06-30 DIAGNOSIS — R739 Hyperglycemia, unspecified: Secondary | ICD-10-CM | POA: Insufficient documentation

## 2015-07-19 ENCOUNTER — Ambulatory Visit
Admission: RE | Admit: 2015-07-19 | Discharge: 2015-07-19 | Disposition: A | Discharge status: Home / Self Care | Payer: BLUE CROSS/BLUE SHIELD | Source: Ambulatory Visit

## 2015-07-19 DIAGNOSIS — Z1231 Encounter for screening mammogram for malignant neoplasm of breast: Secondary | ICD-10-CM

## 2015-09-07 ENCOUNTER — Encounter: Payer: Self-pay | Admitting: Neurology

## 2015-09-07 ENCOUNTER — Ambulatory Visit (INDEPENDENT_AMBULATORY_CARE_PROVIDER_SITE_OTHER): Payer: BLUE CROSS/BLUE SHIELD | Admitting: Neurology

## 2015-09-07 VITALS — BP 138/88 | HR 75 | Ht 65.0 in | Wt 232.0 lb

## 2015-09-07 DIAGNOSIS — E118 Type 2 diabetes mellitus with unspecified complications: Secondary | ICD-10-CM

## 2015-09-07 DIAGNOSIS — I10 Essential (primary) hypertension: Secondary | ICD-10-CM

## 2015-09-07 DIAGNOSIS — Z794 Long term (current) use of insulin: Secondary | ICD-10-CM

## 2015-09-07 DIAGNOSIS — I639 Cerebral infarction, unspecified: Secondary | ICD-10-CM

## 2015-09-07 DIAGNOSIS — Z8774 Personal history of (corrected) congenital malformations of heart and circulatory system: Secondary | ICD-10-CM

## 2015-09-07 DIAGNOSIS — Q211 Atrial septal defect: Secondary | ICD-10-CM

## 2015-09-07 DIAGNOSIS — E785 Hyperlipidemia, unspecified: Secondary | ICD-10-CM

## 2015-09-07 DIAGNOSIS — Z9889 Other specified postprocedural states: Secondary | ICD-10-CM | POA: Diagnosis not present

## 2015-09-07 DIAGNOSIS — M5481 Occipital neuralgia: Secondary | ICD-10-CM | POA: Diagnosis not present

## 2015-09-07 DIAGNOSIS — Q2112 Patent foramen ovale: Secondary | ICD-10-CM

## 2015-09-07 NOTE — Patient Instructions (Signed)
-   continue ASA 325mg  and prevastatin for stroke prevntion - recommend pain management referral for occipital nerve block and radioablation of occipital nerve for occipital neuralgia - check BP and glucose at home. BP goal 120-130, avoid low BP and avoid low glucose - avoid overexertion, fatigue, anxiety - Follow up with your primary care physician for stroke risk factor modification. Recommend maintain blood pressure goal <130/80, diabetes with hemoglobin A1c goal below 6.5% and lipids with LDL cholesterol goal below 70 mg/dL.  - follow up in 6 months

## 2015-09-08 DIAGNOSIS — E118 Type 2 diabetes mellitus with unspecified complications: Secondary | ICD-10-CM | POA: Insufficient documentation

## 2015-09-08 DIAGNOSIS — Z794 Long term (current) use of insulin: Secondary | ICD-10-CM

## 2015-09-08 NOTE — Progress Notes (Signed)
STROKE NEUROLOGY FOLLOW UP NOTE  NAME: Patricia [name] DOB: August 19, 1966  REASON FOR VISIT: stroke follow up HISTORY FROM: pt and chart  Today we had the pleasure of seeing Patricia [name] in follow-up at our Neurology [facility]. Pt was accompanied by daughter and granddaughter.   History Summary 49 y.o. African American female with PMH of obesity, hypertension, diabetes, hyperlipidemia, previous embolic stroke in 2009 presumably due to PFO/ASD state post of closure by Dr. Jacinto Halim was admitted on 05/22/14 for right arm and leg numbness weakness, difficulty with speech. MRI had showed left MCA territory small multiple infarcts. MRA head showed significant intracranial stenosis. She was discharged on aspirin and Plavix dural antiplatelet therapy and outpatient PT/OT.  She had a previous CVA in 2009, MRI showed multiple bilateral small infarcts consistent with cardioembolic stroke. She was found by PFO/ASD, state post of a PFO closure with Dr. Jacinto Halim. She stated she was treated with aspirin and Plavix for some time and then Dr. Jacinto Halim took her off Plavix. She had no residual deficit left from previous stroke.  She had multiple stroke risk factors including hypertension, hyperlipidemia, diabetes, obesity. She stated that she was not a compliant with medication in the past and he now watch for her diet.  She denies any cigarette smoking, alcohol drinking, or illicit drugs.  06/17/14 follow up -  the patient has been doing well. She still not able to write with right hand, and she is getting PT/OT 2/wk. Her BP at home was OK, today in clinic 146/93. Her BP has been low lately so Dr. Jacinto Halim decreased her bystolic. She saw her PCP early this month and changed her from plavix to crestor due to questionable rashes. Continue her on PO meds for DM and Sugar this morning 117.   08/28/15 follow up - she was doing well. Her BP today in clinic was 115/80 and she stated that her sugar level at home was 113,  190, 130 for the last 3 days. Her CTA head and neck was stable comparing with prior, still showing left MCA M1 high grade stenosis as well as bilateral ACA stenosis with diffuse intracranial atherosclerosis. Vasculitis work up negative. TCD bubble study consistent with PFO s/p closure. She has allergic reaction to plavix, so PCP put her on ASA  bid and cilostazol for dural antiplatelet.  However, she complained in clinic today about her headache. She had headache for the last 30 years, the pain are at the back of her head at skull base line, with pain at the back of right eye. 2-3 per month, usually lasting for 2 days. 10/10, phonophobia but no photophobia, nausea but no vomiting. No neck pain. However, for the last 3 weeks, she has similar headache not go away. She takes percocet and phernagen but not effective.  Interval History During the interval time, pt has been doing the same. She had one episode of right arm and leg weakness, word finding difficulty after a long day (work that day, rushed to pick up her grandson for football game, restaurant to eat and drove back). She was admitted to OSH, CT head and MRI showed no acute abnormalities. Normal TTE. She was discharged with resolution of symptoms, likely due to recrudescence of previous stroke.   She still complains of headache with neck pain, consistent with occipital neuralgia. She had occipital nerve block last visit, but she said that was not very effective. She was also on amitriptyline for the headache but she stopped it as  it was also not effective. Currently she was put on fioricet and percocet for the headache with aware of rebound headache. I recommended to her pain management referral to consider radioablation of occipital nerve.   REVIEW OF SYSTEMS: Full 14 system review of systems performed and notable only for those listed below and in HPI above, all others are negative:   Rash, headache, depression  The following represents the  patient's updated allergies and side effects list: Allergies  Allergen Reactions  . Imitrex [Sumatriptan] Anaphylaxis  . Latex   . Other   . Plavix [Clopidogrel Bisulfate] Hives  . Shellfish-Derived Products   . Lipitor [Atorvastatin] Rash    Labs since last visit of relevance include the following: Results for orders placed or performed during the hospital encounter of 02/20/15  Protime-INR  Result Value Ref Range   Prothrombin Time 13.8 11.6 - 15.2 seconds   INR 1.05 0.00 - 1.49  APTT  Result Value Ref Range   aPTT 28 24 - 37 seconds  CBC  Result Value Ref Range   WBC 8.1 4.0 - 10.5 K/uL   RBC 4.58 3.87 - 5.11 MIL/uL   Hemoglobin 13.1 12.0 - 15.0 g/dL   HCT 45.4 09.8 - 11.9 %   MCV 87.1 78.0 - 100.0 fL   MCH 28.6 26.0 - 34.0 pg   MCHC 32.8 30.0 - 36.0 g/dL   RDW 14.7 82.9 - 56.2 %   Platelets 239 150 - 400 K/uL  Differential  Result Value Ref Range   Neutrophils Relative % 67 43 - 77 %   Neutro Abs 5.4 1.7 - 7.7 K/uL   Lymphocytes Relative 25 12 - 46 %   Lymphs Abs 2.1 0.7 - 4.0 K/uL   Monocytes Relative 7 3 - 12 %   Monocytes Absolute 0.6 0.1 - 1.0 K/uL   Eosinophils Relative 1 0 - 5 %   Eosinophils Absolute 0.1 0.0 - 0.7 K/uL   Basophils Relative 0 0 - 1 %   Basophils Absolute 0.0 0.0 - 0.1 K/uL  Comprehensive metabolic panel  Result Value Ref Range   Sodium 131 (L) 135 - 145 mmol/L   Potassium 3.5 3.5 - 5.1 mmol/L   Chloride 93 (L) 96 - 112 mmol/L   CO2 25 19 - 32 mmol/L   Glucose, Bld 644 (HH) 70 - 99 mg/dL   BUN 39 (H) 6 - 23 mg/dL   Creatinine, Ser 1.30 (H) 0.50 - 1.10 mg/dL   Calcium 9.1 8.4 - 86.5 mg/dL   Total Protein 7.4 6.0 - 8.3 g/dL   Albumin 3.5 3.5 - 5.2 g/dL   AST 18 0 - 37 U/L   ALT 15 0 - 35 U/L   Alkaline Phosphatase 128 (H) 39 - 117 U/L   Total Bilirubin 0.2 (L) 0.3 - 1.2 mg/dL   GFR calc non Af Amer 23 (L) >90 mL/min   GFR calc Af Amer 26 (L) >90 mL/min   Anion gap 13 5 - 15  Urinalysis, Routine w reflex microscopic  Result Value  Ref Range   Color, Urine YELLOW YELLOW   APPearance CLEAR CLEAR   Specific Gravity, Urine 1.024 1.005 - 1.030   pH 5.0 5.0 - 8.0   Glucose, UA >1000 (A) NEGATIVE mg/dL   Hgb urine dipstick NEGATIVE NEGATIVE   Bilirubin Urine NEGATIVE NEGATIVE   Ketones, ur NEGATIVE NEGATIVE mg/dL   Protein, ur NEGATIVE NEGATIVE mg/dL   Urobilinogen, UA 0.2 0.0 - 1.0 mg/dL   Nitrite NEGATIVE  NEGATIVE   Leukocytes, UA NEGATIVE NEGATIVE  Hemoglobin A1c  Result Value Ref Range   Hgb A1c MFr Bld 8.5 (H) 4.8 - 5.6 %   Mean Plasma Glucose 197 mg/dL  Lipid panel  Result Value Ref Range   Cholesterol 141 0 - 200 mg/dL   Triglycerides 161 <096 mg/dL   HDL 29 (L) >04 mg/dL   Total CHOL/HDL Ratio 4.9 RATIO   VLDL 28 0 - 40 mg/dL   LDL Cholesterol 84 0 - 99 mg/dL  Sodium, urine, random  Result Value Ref Range   Sodium, Ur 97 mmol/L  Calcium / creatinine ratio, urine  Result Value Ref Range   Calcium, Ur <0.8 Not Estab. mg/dL   Calcium/Creat.Ratio <11 0 - 260 mg/g creat   Creatinine, Urine 75.8 15.0 - 278.0 mg/dL  Glucose, capillary  Result Value Ref Range   Glucose-Capillary 239 (H) 70 - 99 mg/dL  CBC  Result Value Ref Range   WBC 7.0 4.0 - 10.5 K/uL   RBC 4.17 3.87 - 5.11 MIL/uL   Hemoglobin 11.7 (L) 12.0 - 15.0 g/dL   HCT 54.0 (L) 98.1 - 19.1 %   MCV 86.1 78.0 - 100.0 fL   MCH 28.1 26.0 - 34.0 pg   MCHC 32.6 30.0 - 36.0 g/dL   RDW 47.8 29.5 - 62.1 %   Platelets 217 150 - 400 K/uL  Basic metabolic panel  Result Value Ref Range   Sodium 139 135 - 145 mmol/L   Potassium 3.0 (L) 3.5 - 5.1 mmol/L   Chloride 107 101 - 111 mmol/L   CO2 24 22 - 32 mmol/L   Glucose, Bld 104 (H) 70 - 99 mg/dL   BUN 31 (H) 6 - 20 mg/dL   Creatinine, Ser 3.08 (H) 0.44 - 1.00 mg/dL   Calcium 8.5 (L) 8.9 - 10.3 mg/dL   GFR calc non Af Amer 44 (L) >60 mL/min   GFR calc Af Amer 50 (L) >60 mL/min   Anion gap 8 5 - 15  Glucose, capillary  Result Value Ref Range   Glucose-Capillary 104 (H) 70 - 99 mg/dL    Sedimentation rate  Result Value Ref Range   Sed Rate 26 (H) 0 - 22 mm/hr  CK  Result Value Ref Range   Total CK 108 38 - 234 U/L  Glucose, capillary  Result Value Ref Range   Glucose-Capillary 131 (H) 70 - 99 mg/dL  Urine microscopic-add on  Result Value Ref Range   Squamous Epithelial / LPF FEW (A) RARE   WBC, UA 3-6 <3 WBC/hpf   Casts HYALINE CASTS (A) NEGATIVE  Glucose, capillary  Result Value Ref Range   Glucose-Capillary 120 (H) 70 - 99 mg/dL  Glucose, capillary  Result Value Ref Range   Glucose-Capillary 230 (H) 70 - 99 mg/dL  Glucose, capillary  Result Value Ref Range   Glucose-Capillary 197 (H) 70 - 99 mg/dL  Glucose, capillary  Result Value Ref Range   Glucose-Capillary 90 70 - 99 mg/dL  CBG monitoring, ED  Result Value Ref Range   Glucose-Capillary 582 (HH) 70 - 99 mg/dL   Comment 1 Notify RN   I-Stat Chem 8, ED  (not at [facility], [facility])  Result Value Ref Range   Sodium 131 (L) 135 - 145 mmol/L   Potassium 3.5 3.5 - 5.1 mmol/L   Chloride 95 (L) 96 - 112 mmol/L   BUN 41 (H) 6 - 23 mg/dL   Creatinine, Ser 6.57 (H) 0.50 - 1.10 mg/dL  Glucose, Bld 633 (HH) 70 - 99 mg/dL   Calcium, Ion 1.61 0.96 - 1.23 mmol/L   TCO2 20 0 - 100 mmol/L   Hemoglobin 14.6 12.0 - 15.0 g/dL   HCT 04.5 40.9 - 81.1 %   Comment NOTIFIED PHYSICIAN   I-stat troponin, ED (not at [facility], [facility])  Result Value Ref Range   Troponin i, poc 0.00 0.00 - 0.08 ng/mL   Comment 3          CBG monitoring, ED  Result Value Ref Range   Glucose-Capillary 447 (H) 70 - 99 mg/dL  I-Stat venous blood gas, ED  Result Value Ref Range   pH, Ven 7.271 7.250 - 7.300   pCO2, Ven 49.3 45.0 - 50.0 mmHg   pO2, Ven 23.0 (LL) 30.0 - 45.0 mmHg   Bicarbonate 22.7 20.0 - 24.0 mEq/L   TCO2 24 0 - 100 mmol/L   O2 Saturation 32.0 %   Acid-base deficit 5.0 (H) 0.0 - 2.0 mmol/L   Sample type VENOUS    Comment NOTIFIED PHYSICIAN     The neurologically relevant items on the patient's problem list were reviewed on today's  visit.  Neurologic Examination  A problem focused neurological exam (12 or more points of the single system neurologic examination, vital signs counts as 1 point, cranial nerves count for 8 points) was performed.  Blood pressure 138/88, pulse 75, height 5\' 5"  (1.651 m), weight 232 lb (105.235 kg).  General - obese, well developed, in no apparent distress.  Ophthalmologic - Sharp disc margins OU.  Cardiovascular - Regular rate and rhythm with no murmur.  Neck - occipital never tenderness on touch bilaterally.  Mental Status -  Level of arousal and orientation to time, place, and person were intact. Language including expression, naming, repetition, comprehension, reading, and writing was assessed and found intact.  Cranial Nerves II - XII - II - Visual field intact OU. III, IV, VI - Extraocular movements intact. V - Facial sensation intact bilaterally. VII - Facial movement intact bilaterally. VIII - Hearing & vestibular intact bilaterally. X - Palate elevates symmetrically. XI - Chin turning & shoulder shrug intact bilaterally. XII - Tongue protrusion intact.  Motor Strength - The patient's strength was normal in all extremities except right UE distal 5-/5, with decreased ight hand dexterity and right pronator drift.  Bulk was normal and fasciculations were absent.   Motor Tone - Muscle tone was assessed at the neck and appendages and was normal.  Reflexes - The patient's reflexes were normal extremities and she had no pathological reflexes.  Sensory - Light touch, temperature/pinprick and Romberg testing were assessed and were normal.    Coordination - The patient had normal movements in the hands and feet with no ataxia or dysmetria.  Tremor was absent.  Gait and Station - The patient's transfers, posture, gait, station, and turns were observed as normal.  Data reviewed: I personally reviewed the images and agree with the radiology interpretations.  Mr Shirlee Latch Wo  Contrast  05/22/2014  Multiple acute infarctions in the left hemisphere that could be a combination of watershed infarctions and micro embolic infarctions.   MRA  05/22/2014  Advanced intracranial disease with very limited flow and the anterior cerebral artery territories and multiple middle cerebral stenoses, particularly severe and notable at the M1 segment on the left with the vessel shows 90% stenosis. Distal branch vessels appear reduced in number an show irregularity. The findings could be due to widespread advanced atherosclerotic disease or  vasculitis. This represents a marked worsening of disease since 2009. Old infarctions affecting the thalami, hemispheric white matter, genu of the corpus callosum and right parietal cortex at the vertex.   Dg Chest Port 1 View  05/22/2014  There is no active cardiopulmonary disease.   Carotid Doppler Preliminary report: 1-39% ICA stenosis. Vertebral artery flow is antegrade.  2D Echocardiogram Left ventricle: The cavity size was at the upper limits ofnormal. Systolic function was normal.  The estimated ejection fraction was in the range of 55% to 60%. Wall motion was normal; there were no regional wall motion abnormalities. - Atrial septum: There is no color Doppler e/o residual inter atrial shunting. Flow present within the right atrial disk without crossing the septum (normal).  An Amplatzer closure device in the fossa ovalis region was present.  There was no atrial level shunt. No evidence of thrombus.  CTA head and neck 07/03/14 1. Severe proximal left MCA stenosis. Mild proximal right MCA stenosis. Bilateral MCA branch vessel irregularity and attenuation. Occlusion of the anterior cerebral arteries at their origins with some reconstituted but severely diminished flow distally. Mild left P2 stenosis. Considerations again include advanced atherosclerosis and vasculitis. 2. No evidence of cervical carotid or vertebral artery stenosis.  TCD bubble  study 07/06/14 Both middle cerebral arteries could not be insonated using a headset due to poor bitemporal windows and procedure was done insonating left vertebral artery with a hand held probe. And IV line was inserted in the left elbow by the RN using aseptic precautions. Agitated saline injection at rest and after valsalva maneuver did result in several high intensity transient signals (HITS).  Impression: Positive Transcranial Doppler Bubble Study indicative of small residual right to left intracardiac shunt despite endovascular PFO closure.   Component     Latest Ref Rng 05/22/2014 05/23/2014 06/17/2014  PTT Lupus Anticoagulant     28.0 - 43.0 secs 32.0    PTTLA Confirmation     <8.0 secs NOT APPL    PTTLA 4:1 Mix     28.0 - 43.0 secs NOT APPL    DRVVT     <42.9 secs 36.1    Drvvt confirmation     <1.15 Ratio NOT APPL    dRVVT Incubated 1:1 Mix     <42.9 secs NOT APPL    Lupus Anticoagulant     NOT DETECTED NOT DETECTED    ENA RNP Ab     0.0 - 0.9 AI   <0.2  ENA SM Ab Ser-aCnc     0.0 - 0.9 AI   <0.2  Rhuematoid fact SerPl-aCnc     0.0 - 13.9 IU/mL   8.3  Chromatin Ab SerPl-aCnc     0.0 - 0.9 AI   <0.2  ENA SSA (RO) Ab     0.0 - 0.9 AI   0.8  ENA SSB (LA) Ab     0.0 - 0.9 AI   0.2  dsDNA Ab     0 - 9 IU/mL   <1  Cholesterol     0 - 200 mg/dL  409178   Triglycerides     <150 mg/dL  811117   HDL     >91>39 mg/dL  38 (L)   Total CHOL/HDL Ratio       4.7   VLDL     0 - 40 mg/dL  23   LDL (calc)     0 - 99 mg/dL  478117 (H)   Interpretation-PTGENE:      REPORT  Recommendations-PTGENE:      REPORT    Reviewer      REPORT    Beta-2 Glyco I IgG     <20 G Units 0    Beta-2-Glycoprotein I IgM     <20 M Units 1    Beta-2-Glycoprotein I IgA     <20 A Units 27 (H)    Interpretation-F5LEID:      REPORT    Recommendations-F5LEID:      REPORT    Reviewer      REPORT    Anticardiolipin IgG     <23 GPL U/mL 8 (L)    Anticardiolipin IgM     <11 MPL U/mL 0 (L)      Anticardiolipin IgA     <22 APL U/mL 7 (L)    C-ANCA     Neg:<1:20 titer   <1:20  P-ANCA     Neg:<1:20 titer   <1:20  Atypical pANCA     Neg:<1:20 titer   <1:20  Hgb A1c MFr Bld     <5.7 %  8.6 (H)   Mean Plasma Glucose     <117 mg/dL  161 (H)   Protein C Activity     75 - 133 % 154 (H)    Protein C, Total     72 - 160 % 135    Protein S Activity     69 - 129 % 51 (L)    Protein S Ag, Total     60 - 150 % 64    AntiThromb III Func     75 - 120 % 95    Homocysteine     4.0 - 15.4 umol/L 11.7    C3 Complement     90 - 180 mg/dL  096   Complement C4, Body Fluid     10 - 40 mg/dL  42 (H)   Compl, Total (CH50)     31 - 60 U/mL  >60 (H)   Sed Rate     0 - 22 mm/hr  18   ANA Ser Ql     NEGATIVE  NEGATIVE   RPR     NON REAC  NON REAC   HIV     NONREACTIVE  NONREACTIVE   Sickle Cell Prep     Negative   Negative    Assessment: As you may recall, she is a 49 y.o. AA  female with PMH of obesity, hypertension, diabetes, hyperlipidemia, previous embolic stroke in 2009 presumably due to PFO/ASD state post of closure by Dr. Jacinto Halim was admitted one month ago for new left MCA territory multiple small strokes. She had cardioembolic stroke in 2009 and it attributed to PFO at the time. The current stroke was confined in the left MCA territory. She does have intracranial stenosis more prominent at left MCA M1 and bilateral ACA which has in the significant progress from her CTA head and neck in 2009. She does have multiple stroke risk factors including hypertension, hyperlipidemia, uncontrolled diabetes. Repeat CTA no change from prior. TCD bubble study consistent with PFO s/p closure. Vasculitis and hypercoagulable work up negative. Finished dural antiplatelet, and will keep her on ASA 325mg  as she has allergic reaction to plavix. Still have headache most consistent with occipital neuralgia, failed occipital nerve block and amitriptyline. Will recommend pain management for radioablation. Had one  episode of right sided weakness and aphasia in 07/2015 but CT MRI negative considering recrudescence.   Plan:  - continue ASA 325mg  and prevastatin for  stroke prevntion - recommend pain management referral for occipital nerve block and radioablation of occipital nerve for occipital neuralgia - check BP and glucose at home. BP goal 120-130, avoid low BP and avoid low glucose - avoid overexertion, fatigue, anxiety - Follow up with your primary care physician for stroke risk factor modification. Recommend maintain blood pressure goal <130/80, diabetes with hemoglobin A1c goal below 6.5% and lipids with LDL cholesterol goal below 70 mg/dL.  - follow up in 6 months  I spent more than 25 minutes of face to face time with the patient. Greater than 50% of time was spent in counseling and coordination of care. We have discussed about avoid overexertion, fatigue and anxiety for stroke prevention, as well as headache management.    Meds ordered this encounter  Medications  .           .           .           . aspirin EC 325 MG tablet    Sig: Take 1 tablet (325 mg total) by mouth daily.    Dispense:  30 tablet    Refill:  0  . amitriptyline (ELAVIL) 25 MG tablet    Sig:  Qhs for a week and then  Qhs after    Dispense:  60 tablet    Refill:  3     Patient Instructions  - continue ASA  and prevastatin for stroke prevntion - recommend pain management referral for occipital nerve block and radioablation of occipital nerve for occipital neuralgia - check BP and glucose at home. BP goal 120-130, avoid low BP and avoid low glucose - avoid overexertion, fatigue, anxiety - Follow up with your primary care physician for stroke risk factor modification. Recommend maintain blood pressure goal <130/80, diabetes with hemoglobin A1c goal below 6.5% and lipids with LDL cholesterol goal below 70 mg/dL.  - follow up in 6 months    Marvel Plan, MD PhD [facility] [address] [place], [place] [address] [phone]

## 2015-12-27 ENCOUNTER — Other Ambulatory Visit: Payer: Self-pay | Admitting: Internal Medicine

## 2015-12-27 DIAGNOSIS — R29898 Other symptoms and signs involving the musculoskeletal system: Secondary | ICD-10-CM

## 2015-12-27 DIAGNOSIS — R479 Unspecified speech disturbances: Secondary | ICD-10-CM

## 2016-01-04 ENCOUNTER — Encounter: Payer: Self-pay | Admitting: Neurology

## 2016-01-05 ENCOUNTER — Ambulatory Visit
Admission: RE | Admit: 2016-01-05 | Discharge: 2016-01-05 | Disposition: A | Discharge status: Home / Self Care | Payer: BLUE CROSS/BLUE SHIELD | Source: Ambulatory Visit | Attending: Internal Medicine | Admitting: Internal Medicine

## 2016-01-05 DIAGNOSIS — R29898 Other symptoms and signs involving the musculoskeletal system: Secondary | ICD-10-CM

## 2016-01-05 DIAGNOSIS — R479 Unspecified speech disturbances: Secondary | ICD-10-CM

## 2016-01-05 MED ORDER — GADOBENATE DIMEGLUMINE 529 MG/ML IV SOLN
20.0000 mL | Freq: Once | INTRAVENOUS | Status: AC | PRN
  Administered 2016-01-05: 20 mL via INTRAVENOUS

## 2016-01-07 ENCOUNTER — Telehealth: Payer: Self-pay | Admitting: Neurology

## 2016-01-07 NOTE — Telephone Encounter (Signed)
Patient returned call to move appointment with Dr. Roda [name] 5/16, appointment moved to 03/23/16. Patient states she has been having epsiodes of speech difficulty and difficulty being able to write at times.

## 2016-01-07 NOTE — Telephone Encounter (Signed)
Patient states she has been having these symptoms off and on for a while, states it's troublesome because other people notice when she's having these symptoms. Advised patient if she is having stroke symptoms to have someone drive her to ED or call [phone]. Patient states, she is not currently having these symptoms.

## 2016-01-08 NOTE — Telephone Encounter (Signed)
Hi, Lori:  I am confused. If she has those episodes, do you know why she wants to be seen later than original appointment instead of earlier? Thanks.  Marvel PlanJindong Taevion Sikora, MD PhD Stroke Neurology 01/08/2016 11:44 AM

## 2016-03-07 ENCOUNTER — Ambulatory Visit: Payer: BLUE CROSS/BLUE SHIELD | Admitting: Neurology

## 2016-03-15 ENCOUNTER — Telehealth: Payer: Self-pay | Admitting: Neurology

## 2016-03-15 NOTE — Telephone Encounter (Signed)
Patient called, states she has been having more and more episodes of not being able to speak or use right hand, states this has been off and on since October, more in the last 5 days, denies trouble seeing in one or both eyes or trouble walking, loss of balance or coordination, states she does have migraine headaches and had vomiting from headache last week. Patient has upcoming appointment on June 1st with Dr. Jeannett SeniorXu. Skyped nurse Katrina, parked call, nurse picked up call.

## 2016-03-15 NOTE — Telephone Encounter (Signed)
Rn receive incoming call from patient. Pt was last seen 08/2015 by Dr. Roda ShuttersXu. Pt stated in October 2016 she was having problems speaking and not being able to use her right hand. Pt stated in the last week the symptoms are occuring again. Pt has appt on 03/23/2016. Rn stated a message will be sent to Dr. Roda ShuttersXu to speak with her. Pt verbalized understanding.

## 2016-03-16 NOTE — Telephone Encounter (Signed)
Talked with pt over the phone. She said she still has the episode on and off, more frequent now and yesterday she had 4 episodes. 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 I told her that her episodes started last Oct and MRI negative for stroke. She had another MRI this march negative for stroke. Her symptoms are likely recrudescence from her previous strokes in the setting of fatigue, stress, anxiety, depression, infection and etc. She started to cry over the phone. She asked what she can do and I told her to be more relaxed, avoid over exertion, avoid fatigue, be positive, cope with stress and treat for anxiety. She expressed understanding and I will see her next week. She is in agreement.  Marvel PlanJindong Marialuiza Car, MD PhD Stroke Neurology 03/16/2016 3:08 PM

## 2016-03-23 ENCOUNTER — Encounter: Payer: Self-pay | Admitting: Neurology

## 2016-03-23 ENCOUNTER — Ambulatory Visit (INDEPENDENT_AMBULATORY_CARE_PROVIDER_SITE_OTHER): Payer: BLUE CROSS/BLUE SHIELD | Admitting: Neurology

## 2016-03-23 VITALS — BP 133/91 | HR 72 | Ht 65.0 in | Wt 232.8 lb

## 2016-03-23 DIAGNOSIS — I10 Essential (primary) hypertension: Secondary | ICD-10-CM | POA: Diagnosis not present

## 2016-03-23 DIAGNOSIS — R51 Headache: Secondary | ICD-10-CM

## 2016-03-23 DIAGNOSIS — E118 Type 2 diabetes mellitus with unspecified complications: Secondary | ICD-10-CM

## 2016-03-23 DIAGNOSIS — Q2112 Patent foramen ovale: Secondary | ICD-10-CM

## 2016-03-23 DIAGNOSIS — E785 Hyperlipidemia, unspecified: Secondary | ICD-10-CM

## 2016-03-23 DIAGNOSIS — I639 Cerebral infarction, unspecified: Secondary | ICD-10-CM | POA: Diagnosis not present

## 2016-03-23 DIAGNOSIS — Z8774 Personal history of (corrected) congenital malformations of heart and circulatory system: Secondary | ICD-10-CM

## 2016-03-23 DIAGNOSIS — R519 Headache, unspecified: Secondary | ICD-10-CM | POA: Insufficient documentation

## 2016-03-23 DIAGNOSIS — G44221 Chronic tension-type headache, intractable: Secondary | ICD-10-CM | POA: Diagnosis not present

## 2016-03-23 DIAGNOSIS — Q211 Atrial septal defect: Secondary | ICD-10-CM

## 2016-03-23 DIAGNOSIS — Z794 Long term (current) use of insulin: Secondary | ICD-10-CM | POA: Diagnosis not present

## 2016-03-23 DIAGNOSIS — Z9889 Other specified postprocedural states: Secondary | ICD-10-CM | POA: Diagnosis not present

## 2016-03-23 MED ORDER — GABAPENTIN 300 MG PO CAPS: 300.0000 mg | ORAL_CAPSULE | Freq: Three times a day (TID) | ORAL | Status: DC

## 2016-03-23 NOTE — Progress Notes (Signed)
STROKE NEUROLOGY FOLLOW UP NOTE  NAME: Patricia [name] DOB: Mar 14, 1966  REASON FOR VISIT: stroke follow up HISTORY FROM: pt and chart  Today we had the pleasure of seeing Patricia [name] in follow-up at our Neurology Clinic. Pt was accompanied by daughter and granddaughter.   History Summary 50 y.o. African American female with PMH of obesity, hypertension, diabetes, hyperlipidemia, previous embolic stroke in 2009 presumably due to PFO/ASD state post of closure by Dr. Jacinto Halim was admitted on 05/22/14 for right arm and leg numbness weakness, difficulty with speech. MRI had showed left MCA territory small multiple infarcts. MRA head showed significant intracranial stenosis. She was discharged on aspirin and Plavix dural antiplatelet therapy and outpatient PT/OT.  She had a previous CVA in 2009, MRI showed multiple bilateral small infarcts consistent with cardioembolic stroke. She was found by PFO/ASD, state post of a PFO closure with Dr. Jacinto Halim. She stated she was treated with aspirin and Plavix for some time and then Dr. Jacinto Halim took her off Plavix. She had no residual deficit left from previous stroke.  She had multiple stroke risk factors including hypertension, hyperlipidemia, diabetes, obesity. She stated that she was not a compliant with medication in the past and he now watch for her diet.  She denies any cigarette smoking, alcohol drinking, or illicit drugs.  06/17/14 follow up -  the patient has been doing well. She still not able to write with right hand, and she is getting PT/OT 2/wk. Her BP at home was OK, today in clinic 146/93. Her BP has been low lately so Dr. Jacinto Halim decreased her bystolic. She saw her PCP early this month and changed her from plavix to crestor due to questionable rashes. Continue her on PO meds for DM and Sugar this morning 117.   08/27/14 follow up - she was doing well. Her BP today in clinic was 115/80 and she stated that her sugar level at home was 113,  190, 130 for the last 3 days. Her CTA head and neck was stable comparing with prior, still showing left MCA M1 high grade stenosis as well as bilateral ACA stenosis with diffuse intracranial atherosclerosis. Vasculitis work up negative. TCD bubble study consistent with PFO s/p closure. She has allergic reaction to plavix, so PCP put her on ASA 81mg  bid and cilostazol for dural antiplatelet.  However, she complained in clinic today about her headache. She had headache for the last 30 years, the pain are at the back of her head at skull base line, with pain at the back of right eye. 2-3 per month, usually lasting for 2 days. 10/10, phonophobia but no photophobia, nausea but no vomiting. No neck pain. However, for the last 3 weeks, she has similar headache not go away. She takes percocet and phernagen but not effective.  09/07/15 follow up - pt has been doing the same. She had one episode of right arm and leg weakness, word finding difficulty after a long day (work that day, rushed to pick up her grandson for football game, restaurant to eat and drove back). She was admitted to OSH, CT head and MRI showed no acute abnormalities. Normal TTE. She was discharged with resolution of symptoms, likely due to recrudescence of previous stroke.   She still complains of headache with neck pain, consistent with occipital neuralgia. She had occipital nerve block last visit, but she said that was not very effective. She was also on amitriptyline for the headache but she stopped it as it was  also not effective. Currently she was put on fioricet and percocet for the headache with aware of rebound headache. I recommended to her pain management referral to consider radioablation of occipital nerve.   Interval History During the interval time, pt has been doing fine from stroke standpoint. She recently complains that she has the episodes of difficulty speaking, getting words out, on and off, increasingly frequent. She has  similar episodes 07/2015 and MRI negative for stroke. She again had another MRI 12/2015 and negative for stroke. Her symptoms are likely recrudescence from her previous strokes in the setting of fatigue, stress, anxiety, depression, infection and etc. However, she stated that the episodes happened randomly, not associated with above triggers. She also denies association with HA or stress. However, she stated that the episodes seem to happen when her BP was low, as low as 100/60. Currently in clinic, she has no deficit and still in depressed mood.  She also complains of HA which has been going on for long time. She has daily HA, has tried inderal, depakote, amitriptyline, topamax, imitrex, botox but none of them worked either due to side effects or ineffectiveness. She also had LP for CSF drainage due to ? Increased intracranial pressure, but still not effective. She denies tension type HA.   REVIEW OF SYSTEMS: Full 14 system review of systems performed and notable only for those listed below and in HPI above, all others are negative:   Rash, headache, depression  The following represents the patient's updated allergies and side effects list: Allergies  Allergen Reactions  . Imitrex [Sumatriptan] Anaphylaxis  . Iodinated Diagnostic Agents   . Other   . Plavix [Clopidogrel Bisulfate] Hives  . Lipitor [Atorvastatin] Rash    Labs since last visit of relevance include the following: Results for orders placed or performed during the hospital encounter of 02/20/15  Protime-INR  Result Value Ref Range   Prothrombin Time 13.8 11.6 - 15.2 seconds   INR 1.05 0.00 - 1.49  APTT  Result Value Ref Range   aPTT 28 24 - 37 seconds  CBC  Result Value Ref Range   WBC 8.1 4.0 - 10.5 K/uL   RBC 4.58 3.87 - 5.11 MIL/uL   Hemoglobin 13.1 12.0 - 15.0 g/dL   HCT 16.1 09.6 - 04.5 %   MCV 87.1 78.0 - 100.0 fL   MCH 28.6 26.0 - 34.0 pg   MCHC 32.8 30.0 - 36.0 g/dL   RDW 40.9 81.1 - 91.4 %   Platelets 239 150  - 400 K/uL  Differential  Result Value Ref Range   Neutrophils Relative % 67 43 - 77 %   Neutro Abs 5.4 1.7 - 7.7 K/uL   Lymphocytes Relative 25 12 - 46 %   Lymphs Abs 2.1 0.7 - 4.0 K/uL   Monocytes Relative 7 3 - 12 %   Monocytes Absolute 0.6 0.1 - 1.0 K/uL   Eosinophils Relative 1 0 - 5 %   Eosinophils Absolute 0.1 0.0 - 0.7 K/uL   Basophils Relative 0 0 - 1 %   Basophils Absolute 0.0 0.0 - 0.1 K/uL  Comprehensive metabolic panel  Result Value Ref Range   Sodium 131 (L) 135 - 145 mmol/L   Potassium 3.5 3.5 - 5.1 mmol/L   Chloride 93 (L) 96 - 112 mmol/L   CO2 25 19 - 32 mmol/L   Glucose, Bld 644 (HH) 70 - 99 mg/dL   BUN 39 (H) 6 - 23 mg/dL   Creatinine, Ser 7.82 (  H) 0.50 - 1.10 mg/dL   Calcium 9.1 8.4 - 16.1 mg/dL   Total Protein 7.4 6.0 - 8.3 g/dL   Albumin 3.5 3.5 - 5.2 g/dL   AST 18 0 - 37 U/L   ALT 15 0 - 35 U/L   Alkaline Phosphatase 128 (H) 39 - 117 U/L   Total Bilirubin 0.2 (L) 0.3 - 1.2 mg/dL   GFR calc non Af Amer 23 (L) >90 mL/min   GFR calc Af Amer 26 (L) >90 mL/min   Anion gap 13 5 - 15  Urinalysis, Routine w reflex microscopic  Result Value Ref Range   Color, Urine YELLOW YELLOW   APPearance CLEAR CLEAR   Specific Gravity, Urine 1.024 1.005 - 1.030   pH 5.0 5.0 - 8.0   Glucose, UA >1000 (A) NEGATIVE mg/dL   Hgb urine dipstick NEGATIVE NEGATIVE   Bilirubin Urine NEGATIVE NEGATIVE   Ketones, ur NEGATIVE NEGATIVE mg/dL   Protein, ur NEGATIVE NEGATIVE mg/dL   Urobilinogen, UA 0.2 0.0 - 1.0 mg/dL   Nitrite NEGATIVE NEGATIVE   Leukocytes, UA NEGATIVE NEGATIVE  Hemoglobin A1c  Result Value Ref Range   Hgb A1c MFr Bld 8.5 (H) 4.8 - 5.6 %   Mean Plasma Glucose 197 mg/dL  Lipid panel  Result Value Ref Range   Cholesterol 141 0 - 200 mg/dL   Triglycerides 096 <045 mg/dL   HDL 29 (L) >40 mg/dL   Total CHOL/HDL Ratio 4.9 RATIO   VLDL 28 0 - 40 mg/dL   LDL Cholesterol 84 0 - 99 mg/dL  Sodium, urine, random  Result Value Ref Range   Sodium, Ur 97 mmol/L    Calcium / creatinine ratio, urine  Result Value Ref Range   Calcium, Ur <0.8 Not Estab. mg/dL   Calcium/Creat.Ratio <11 0 - 260 mg/g creat   Creatinine, Urine 75.8 15.0 - 278.0 mg/dL  Glucose, capillary  Result Value Ref Range   Glucose-Capillary 239 (H) 70 - 99 mg/dL  CBC  Result Value Ref Range   WBC 7.0 4.0 - 10.5 K/uL   RBC 4.17 3.87 - 5.11 MIL/uL   Hemoglobin 11.7 (L) 12.0 - 15.0 g/dL   HCT 98.1 (L) 19.1 - 47.8 %   MCV 86.1 78.0 - 100.0 fL   MCH 28.1 26.0 - 34.0 pg   MCHC 32.6 30.0 - 36.0 g/dL   RDW 29.5 62.1 - 30.8 %   Platelets 217 150 - 400 K/uL  Basic metabolic panel  Result Value Ref Range   Sodium 139 135 - 145 mmol/L   Potassium 3.0 (L) 3.5 - 5.1 mmol/L   Chloride 107 101 - 111 mmol/L   CO2 24 22 - 32 mmol/L   Glucose, Bld 104 (H) 70 - 99 mg/dL   BUN 31 (H) 6 - 20 mg/dL   Creatinine, Ser 6.57 (H) 0.44 - 1.00 mg/dL   Calcium 8.5 (L) 8.9 - 10.3 mg/dL   GFR calc non Af Amer 44 (L) >60 mL/min   GFR calc Af Amer 50 (L) >60 mL/min   Anion gap 8 5 - 15  Glucose, capillary  Result Value Ref Range   Glucose-Capillary 104 (H) 70 - 99 mg/dL  Sedimentation rate  Result Value Ref Range   Sed Rate 26 (H) 0 - 22 mm/hr  CK  Result Value Ref Range   Total CK 108 38 - 234 U/L  Glucose, capillary  Result Value Ref Range   Glucose-Capillary 131 (H) 70 - 99 mg/dL  Urine microscopic-add  on  Result Value Ref Range   Squamous Epithelial / LPF FEW (A) RARE   WBC, UA 3-6 <3 WBC/hpf   Casts HYALINE CASTS (A) NEGATIVE  Glucose, capillary  Result Value Ref Range   Glucose-Capillary 120 (H) 70 - 99 mg/dL  Glucose, capillary  Result Value Ref Range   Glucose-Capillary 230 (H) 70 - 99 mg/dL  Glucose, capillary  Result Value Ref Range   Glucose-Capillary 197 (H) 70 - 99 mg/dL  Glucose, capillary  Result Value Ref Range   Glucose-Capillary 90 70 - 99 mg/dL  CBG monitoring, ED  Result Value Ref Range   Glucose-Capillary 582 (HH) 70 - 99 mg/dL   Comment 1 Notify RN   I-Stat  Chem 8, ED  (not at [facility], [facility])  Result Value Ref Range   Sodium 131 (L) 135 - 145 mmol/L   Potassium 3.5 3.5 - 5.1 mmol/L   Chloride 95 (L) 96 - 112 mmol/L   BUN 41 (H) 6 - 23 mg/dL   Creatinine, Ser 1.61 (H) 0.50 - 1.10 mg/dL   Glucose, Bld 096 (HH) 70 - 99 mg/dL   Calcium, Ion 0.45 4.09 - 1.23 mmol/L   TCO2 20 0 - 100 mmol/L   Hemoglobin 14.6 12.0 - 15.0 g/dL   HCT 81.1 91.4 - 78.2 %   Comment NOTIFIED PHYSICIAN   I-stat troponin, ED (not at [facility], [facility])  Result Value Ref Range   Troponin i, poc 0.00 0.00 - 0.08 ng/mL   Comment 3          CBG monitoring, ED  Result Value Ref Range   Glucose-Capillary 447 (H) 70 - 99 mg/dL  I-Stat venous blood gas, ED  Result Value Ref Range   pH, Ven 7.271 7.250 - 7.300   pCO2, Ven 49.3 45.0 - 50.0 mmHg   pO2, Ven 23.0 (LL) 30.0 - 45.0 mmHg   Bicarbonate 22.7 20.0 - 24.0 mEq/L   TCO2 24 0 - 100 mmol/L   O2 Saturation 32.0 %   Acid-base deficit 5.0 (H) 0.0 - 2.0 mmol/L   Sample type VENOUS    Comment NOTIFIED PHYSICIAN     The neurologically relevant items on the patient's problem list were reviewed on today's visit.  Neurologic Examination  A problem focused neurological exam (12 or more points of the single system neurologic examination, vital signs counts as 1 point, cranial nerves count for 8 points) was performed.  Blood pressure 133/91, pulse 72, height 5\' 5"  (1.651 m), weight 232 lb 12.8 oz (105.597 kg).  General - obese, well developed, in no apparent distress.  Ophthalmologic - Sharp disc margins OU.  Cardiovascular - Regular rate and rhythm with no murmur.  Neck - occipital never tenderness on touch bilaterally.  Mental Status -  Level of arousal and orientation to time, place, and person were intact. Language including expression, naming, repetition, comprehension, reading, and writing was assessed and found intact.  Cranial Nerves II - XII - II - Visual field intact OU. III, IV, VI - Extraocular movements intact. V -  Facial sensation intact bilaterally. VII - Facial movement intact bilaterally. VIII - Hearing & vestibular intact bilaterally. X - Palate elevates symmetrically. XI - Chin turning & shoulder shrug intact bilaterally. XII - Tongue protrusion intact.  Motor Strength - The patient's strength was normal in all extremities except right UE distal 5-/5, with decreased ight hand dexterity and right pronator drift.  Bulk was normal and fasciculations were absent.   Motor Tone - Muscle  tone was assessed at the neck and appendages and was normal.  Reflexes - The patient's reflexes were normal extremities and she had no pathological reflexes.  Sensory - Light touch, temperature/pinprick and Romberg testing were assessed and were normal.    Coordination - The patient had normal movements in the hands and feet with no ataxia or dysmetria.  Tremor was absent.  Gait and Station - The patient's transfers, posture, gait, station, and turns were observed as normal.  Data reviewed: I personally reviewed the images and agree with the radiology interpretations.  Mr Shirlee Latch Wo Contrast  05/22/2014  Multiple acute infarctions in the left hemisphere that could be a combination of watershed infarctions and micro embolic infarctions.   MRA  05/22/2014  Advanced intracranial disease with very limited flow and the anterior cerebral artery territories and multiple middle cerebral stenoses, particularly severe and notable at the M1 segment on the left with the vessel shows 90% stenosis. Distal branch vessels appear reduced in number an show irregularity. The findings could be due to widespread advanced atherosclerotic disease or vasculitis. This represents a marked worsening of disease since 2009. Old infarctions affecting the thalami, hemispheric white matter, genu of the corpus callosum and right parietal cortex at the vertex.   Dg Chest Port 1 View  05/22/2014  There is no active cardiopulmonary disease.    Carotid Doppler Preliminary report: 1-39% ICA stenosis. Vertebral artery flow is antegrade.  2D Echocardiogram Left ventricle: The cavity size was at the upper limits ofnormal. Systolic function was normal.  The estimated ejection fraction was in the range of 55% to 60%. Wall motion was normal; there were no regional wall motion abnormalities. - Atrial septum: There is no color Doppler e/o residual inter atrial shunting. Flow present within the right atrial disk without crossing the septum (normal).  An Amplatzer closure device in the fossa ovalis region was present.  There was no atrial level shunt. No evidence of thrombus.  CTA head and neck 07/03/14 1. Severe proximal left MCA stenosis. Mild proximal right MCA stenosis. Bilateral MCA branch vessel irregularity and attenuation. Occlusion of the anterior cerebral arteries at their origins with some reconstituted but severely diminished flow distally. Mild left P2 stenosis. Considerations again include advanced atherosclerosis and vasculitis. 2. No evidence of cervical carotid or vertebral artery stenosis.  TCD bubble study 07/06/14 Both middle cerebral arteries could not be insonated using a headset due to poor bitemporal windows and procedure was done insonating left vertebral artery with a hand held probe. And IV line was inserted in the left elbow by the RN using aseptic precautions. Agitated saline injection at rest and after valsalva maneuver did result in several high intensity transient signals (HITS).  Impression: Positive Transcranial Doppler Bubble Study indicative of small residual right to left intracardiac shunt despite endovascular PFO closure.   MRI and MRA head 02/2015 -  MRI HEAD: No acute intracranial process, specifically no acute Infarct. Multifocal small areas of encephalomalacia corresponding to prior LEFT cerebral remote infarcts. Remote bilateral basal ganglia and thalamus lacunar infarcts. Moderate white matter  changes suggest chronic small vessel ischemic disease.  MRA HEAD: Focal high-grade stenosis of LEFT M1 segment, stable. Chronically occluded RIGHT anterior cerebral artery, with thready irregular LEFT anterior cerebral artery, unchanged. Thready, somewhat narrowed basilar artery, worse than prior examination though this may be accentuated by motion artifact. Moderate stenosis LEFT P2 segment, worse than prior imaging. Improved appearance of the LEFT middle cerebral artery from prior imaging, suggesting prior vasculitis.  MRI brain 01/05/16 No acute or subacute infarction. Old infarctions affecting the thalami, basal ganglia, genu of the corpus callosum, hemispheric white matter and left and right cortical and subcortical infarctions at the vertex.  Component     Latest Ref Rng 05/22/2014 05/23/2014 06/17/2014  PTT Lupus Anticoagulant     28.0 - 43.0 secs 32.0    PTTLA Confirmation     <8.0 secs NOT APPL    PTTLA 4:1 Mix     28.0 - 43.0 secs NOT APPL    DRVVT     <42.9 secs 36.1    Drvvt confirmation     <1.15 Ratio NOT APPL    dRVVT Incubated 1:1 Mix     <42.9 secs NOT APPL    Lupus Anticoagulant     NOT DETECTED NOT DETECTED    ENA RNP Ab     0.0 - 0.9 AI   <0.2  ENA SM Ab Ser-aCnc     0.0 - 0.9 AI   <0.2  Rhuematoid fact SerPl-aCnc     0.0 - 13.9 IU/mL   8.3  Chromatin Ab SerPl-aCnc     0.0 - 0.9 AI   <0.2  ENA SSA (RO) Ab     0.0 - 0.9 AI   0.8  ENA SSB (LA) Ab     0.0 - 0.9 AI   0.2  dsDNA Ab     0 - 9 IU/mL   <1  Cholesterol     0 - 200 mg/dL  161   Triglycerides     <150 mg/dL  096   HDL     >04 mg/dL  38 (L)   Total CHOL/HDL Ratio       4.7   VLDL     0 - 40 mg/dL  23   LDL (calc)     0 - 99 mg/dL  540 (H)   Interpretation-PTGENE:      REPORT    Recommendations-PTGENE:      REPORT    Reviewer      REPORT    Beta-2 Glyco I IgG     <20 G Units 0    Beta-2-Glycoprotein I IgM     <20 M Units 1    Beta-2-Glycoprotein I IgA     <20 A Units 27 (H)      Interpretation-F5LEID:      REPORT    Recommendations-F5LEID:      REPORT    Reviewer      REPORT    Anticardiolipin IgG     <23 GPL U/mL 8 (L)    Anticardiolipin IgM     <11 MPL U/mL 0 (L)    Anticardiolipin IgA     <22 APL U/mL 7 (L)    C-ANCA     Neg:<1:20 titer   <1:20  P-ANCA     Neg:<1:20 titer   <1:20  Atypical pANCA     Neg:<1:20 titer   <1:20  Hgb A1c MFr Bld     <5.7 %  8.6 (H)   Mean Plasma Glucose     <117 mg/dL  981 (H)   Protein C Activity     75 - 133 % 154 (H)    Protein C, Total     72 - 160 % 135    Protein S Activity     69 - 129 % 51 (L)    Protein S Ag, Total     60 - 150 % 64  AntiThromb III Func     75 - 120 % 95    Homocysteine     4.0 - 15.4 umol/L 11.7    C3 Complement     90 - 180 mg/dL  161   Complement C4, Body Fluid     10 - 40 mg/dL  42 (H)   Compl, Total (CH50)     31 - 60 U/mL  >60 (H)   Sed Rate     0 - 22 mm/hr  18   ANA Ser Ql     NEGATIVE  NEGATIVE   RPR     NON REAC  NON REAC   HIV     NONREACTIVE  NONREACTIVE   Sickle Cell Prep     Negative   Negative    Assessment: As you may recall, she is a 50 y.o. AA  female with PMH of obesity, hypertension, diabetes, hyperlipidemia, previous embolic stroke in 2009 presumably due to PFO/ASD state post of closure by Dr. Jacinto Halim was admitted one month ago for new left MCA territory multiple small strokes. She had cardioembolic stroke in 2009 and it attributed to PFO at the time. The current stroke was confined in the left MCA territory. She does have intracranial stenosis more prominent at left MCA M1 and bilateral ACA which has in the significant progress from her CTA head and neck in 2009. She does have multiple stroke risk factors including hypertension, hyperlipidemia, uncontrolled diabetes. Repeat CTA no change from prior. TCD bubble study consistent with PFO s/p closure. Vasculitis and hypercoagulable work up negative. Finished dural antiplatelet, and will keep her on ASA 325mg  as  she has allergic reaction to plavix.   Had one episode of right sided weakness and aphasia in 07/2015 but CT MRI negative considering recrudescence. She again had MRI head in 12/2015 showed no acute infarct. Pt continues to complain of on and off episodes of speech difficulty but work up negative and likely due to anxiety, depression, fatigue and/or low BP. Will do EEG to rule out seizure. She continue to complains chronic HA, has been a wide variety of HA preventive medications but not able to tolerate vs. Side effects. Will try nurontin this time.   Plan:  - continue ASA 325mg  and prevastatin for stroke prevntion - check BP and glucose at home. BP goal 120-130, avoid low BP and avoid low glucose. Keep hydrated - recommend psychology counseling.  - avoid overexertion, fatigue, anxiety, depressed mood, infection - Follow up with your primary care physician for stroke risk factor modification. Recommend maintain blood pressure goal <130/80, diabetes with hemoglobin A1c goal below 6.5% and lipids with LDL cholesterol goal below 70 mg/dL.  - will do EEG to rule out seizure - will prescribe neurontin 300mg  at night for HA prevention - try to limit use of percocet which can cause rebound headache.  - follow up in 6 months  I spent more than 25 minutes of face to face time with the patient. Greater than 50% of time was spent in counseling and coordination of care. We have discussed about avoid overexertion, fatigue and anxiety, as well as headache management.    Meds ordered this encounter  Medications  . SOLIQUA 100-33 UNT-MCG/ML SOPN    Sig: INJECT 32 UNITS UNDER THE SKIN QD    Refill:  6  . Empagliflozin-Metformin HCl (SYNJARDY) 12.02-999 MG TABS    Sig: Take 1 tablet by mouth.  . gabapentin (NEURONTIN) 300 MG capsule    Sig: Take  1 capsule (300 mg total) by mouth 3 (three) times daily.    Dispense:  30 capsule    Refill:  3    Patient Instructions  - continue ASA 325mg  and prevastatin  for stroke prevntion - check BP and glucose at home. BP goal 120-130, avoid low BP and avoid low glucose. Keep hydrated - recommend psychology counseling.  - avoid overexertion, fatigue, anxiety, depressed mood, infection - Follow up with your primary care physician for stroke risk factor modification. Recommend maintain blood pressure goal <130/80, diabetes with hemoglobin A1c goal below 6.5% and lipids with LDL cholesterol goal below 70 mg/dL.  - will do EEG to rule out seizure - will prescribe neurontin 300mg  at night for HA prevention - try to limit use of percocet which can cause rebound headache.  - follow up in 6 months    Marvel PlanJindong Mayco Walrond, MD PhD [facility] [address], [address] [place], [place] [address] [phone]

## 2016-03-23 NOTE — Patient Instructions (Signed)
-   continue ASA 325mg  and prevastatin for stroke prevntion - check BP and glucose at home. BP goal 120-130, avoid low BP and avoid low glucose. Keep hydrated - recommend psychology counseling.  - avoid overexertion, fatigue, anxiety, depressed mood, infection - Follow up with your primary care physician for stroke risk factor modification. Recommend maintain blood pressure goal <130/80, diabetes with hemoglobin A1c goal below 6.5% and lipids with LDL cholesterol goal below 70 mg/dL.  - will do EEG to rule out seizure - will prescribe neurontin 300mg  at night for HA prevention - try to limit use of percocet which can cause rebound headache.  - follow up in 6 months

## 2016-03-28 ENCOUNTER — Other Ambulatory Visit: Payer: Self-pay | Admitting: Neurology

## 2016-03-28 ENCOUNTER — Telehealth: Payer: Self-pay | Admitting: Neurology

## 2016-03-28 DIAGNOSIS — G44221 Chronic tension-type headache, intractable: Secondary | ICD-10-CM

## 2016-03-28 MED ORDER — GABAPENTIN 300 MG PO CAPS: 300.0000 mg | ORAL_CAPSULE | Freq: Every day | ORAL | Status: DC

## 2016-03-28 NOTE — Telephone Encounter (Signed)
LFT vm for patient concerning her gabapentin medication, and the effects its having on her. Rn stated message will be sent to Dr. Roda ShuttersXu.

## 2016-03-28 NOTE — Telephone Encounter (Signed)
Please tell her to cut it in half, i.e. Take 150mg  at night to see if she is able to tolerate. Thanks.  Marvel PlanJindong Meghna Hagmann, MD PhD Stroke Neurology 03/28/2016 10:38 PM

## 2016-03-28 NOTE — Telephone Encounter (Signed)
Patient is returning your call and would like a return call. Thanks!

## 2016-03-28 NOTE — Telephone Encounter (Signed)
Rn call patient back about taking the gabapentin 300mg  at night. Patricia [name] stated she was only taking one tablet at night, and the next day she feels drunk and woozy feeling. Pt stated she has been taking it for the last 4 nights 300mg  at night for headaches., and it makes her feel drunk. Rn stated a message will be sent to Dr. Roda ShuttersXu

## 2016-03-28 NOTE — Telephone Encounter (Signed)
Pt is having a hard time waking up in the morning taking gabapentin (NEURONTIN) 300 MG capsule. She says she has a drunk feeling during the day with it. She can not functions. Please call pt [phone]

## 2016-03-28 NOTE — Telephone Encounter (Signed)
Please let her know the medication is for at night only. Take 300mg  only at night for HA prevention. I have changed her Rx to the pharmacy. If she still has difficulty with night time dose only. We have to decrease the dose. Thanks.  Marvel PlanJindong Gunner Iodice, MD PhD Stroke Neurology 03/28/2016 4:35 PM

## 2016-03-29 MED ORDER — GABAPENTIN (ONCE-DAILY) 300 MG PO TABS: 150.0000 mg | ORAL_TABLET | Freq: Every day | ORAL | Status: DC

## 2016-03-29 NOTE — Telephone Encounter (Signed)
Pt returned Katrina's call- RN told operator to relay msg to pt. Pt said it is a capsule. Rn said she would call pt back

## 2016-03-29 NOTE — Telephone Encounter (Addendum)
LFt vm for patient to cut the gabapentin in half and take it at night per Dr. Roda ShuttersXu note. Pt will be taking 150mg  to see if the drunk and woozy feeling subside.

## 2016-03-29 NOTE — Addendum Note (Signed)
Addended by: Marvel PlanXU, Madisson Kulaga on: 03/29/2016 10:14 PM   Modules accepted: Orders, Medications

## 2016-03-29 NOTE — Telephone Encounter (Signed)
Please tell her I have ordered 300mg  gabapentin tablet for her to her pharmacy and she can pick up. Take half tablet every night for headache prevention. They do not have 150mg  tablets, so I have to order 300mg  tablets and take half every night. Thanks.  Marvel PlanJindong Zakkery Dorian, MD PhD Stroke Neurology 03/29/2016 10:13 PM  Meds ordered this encounter  Medications  . Gabapentin, Once-Daily, 300 MG TABS    Sig: Take 150 mg by mouth at bedtime.    Dispense:  30 tablet    Refill:  2

## 2016-03-30 NOTE — Telephone Encounter (Addendum)
Rn call patient back about gabapentin tablet. Rn stated Dr. Roda ShuttersXu sent gabapentin 300mg  [facility] [address].Rn stated per Dr. Roda ShuttersXu notes she can cut it in half and take every night.Patient stated being that she just got the 300mg  of gabapentin will her insurance cover it. Rn stated it they are any issues I can call the insurance company or pharmacy and tell them pt was not tolerating the 300mg  dosage and she need to take a half. Pt was prescribed a capsule originally and had to be change to a tablet. Pt verbalized understanding.

## 2016-04-03 ENCOUNTER — Other Ambulatory Visit: Payer: Self-pay | Admitting: Neurology

## 2016-04-03 ENCOUNTER — Telehealth: Payer: Self-pay | Admitting: Neurology

## 2016-04-03 DIAGNOSIS — G44221 Chronic tension-type headache, intractable: Secondary | ICD-10-CM

## 2016-04-03 MED ORDER — GABAPENTIN 250 MG/5ML PO SOLN: 150.0000 mg | Freq: Three times a day (TID) | ORAL | Status: DC

## 2016-04-03 NOTE — Telephone Encounter (Signed)
Patient called to advise, insurance won't cover Neurontin tablets because week prior she had picked up prescription for Neurontin capsules, states she couldn't take the capsules. Patient needs to know what to do for the headache. Please call to advise [phone].

## 2016-04-03 NOTE — Telephone Encounter (Signed)
LFt vm for patient if Dr.Xu could talk to the insurance company or pharmacist about approving the medication. Rn stated the only thing that was change was the form of pill.

## 2016-04-03 NOTE — Telephone Encounter (Signed)
Rn receive incoming call from patient that her insurance will not cover the tablet form for neurontin. Pt had just pick up neurontin a week earlier and they will not cover the tablet form. Rn stated a message will be sent to Dr. Roda ShuttersXu.

## 2016-04-03 NOTE — Telephone Encounter (Signed)
I called pt pharmacy and they recommended gabapentin solution which is easy to adjust the dose. I have ordered her gabapentin 250mg /565ml and she will take 150mg /713ml every night. I called pt back and let her know. She expressed understanding and appreciation.   Marvel PlanJindong Elmus Mathes, MD PhD Stroke Neurology 04/03/2016 4:05 PM

## 2016-04-14 IMAGING — MR MR HEAD W/O CM
9 of 12 series · 28 of 48 positions shown · non-contrast
Comparison: CT of the head February 20, 2015 and MRI /MRA of the head
May 22, 2015

CLINICAL DATA: RIGHT foot weakness for 1 hour at 6 p.m. last night.
Mild dizziness. Hyperglycemia. History of hypertension, stroke,
diabetes, headache.

EXAM:
MRI HEAD WITHOUT CONTRAST
MRA HEAD WITHOUT CONTRAST
TECHNIQUE: Multiplanar, multiecho pulse sequences of the brain and surrounding
structures were obtained without intravenous contrast. Angiographic
images of the head were obtained using MRA technique without
contrast.

[Series 3: FLAIR · sagittal · 5.0mm · 0.47mm/px · 1 of 23 slices shown (1 of 2)]
[im 1/23]
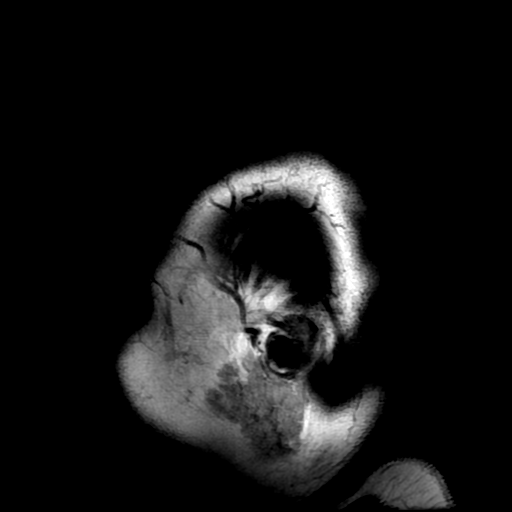

[Series 5: DWI · axial · 3.0mm · 0.94mm/px · z∈[-100,+33]mm · 6 of 92 slices shown (1 of 4)]
[im 1/92]
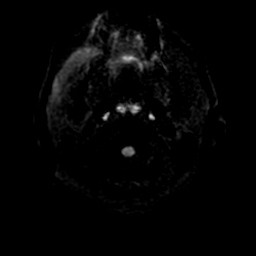
[im 19/92]
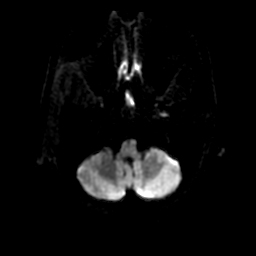
[im 37/92]
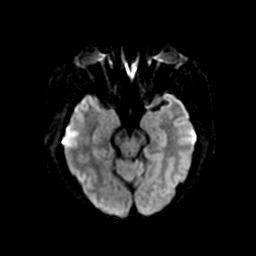
[im 55/92]
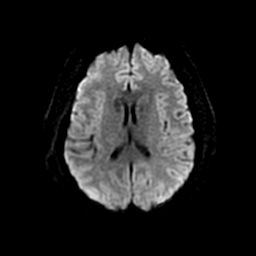
[im 73/92]
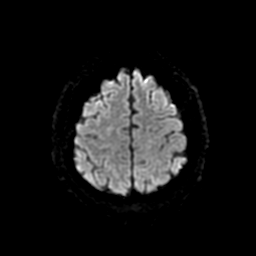
[im 92/92]
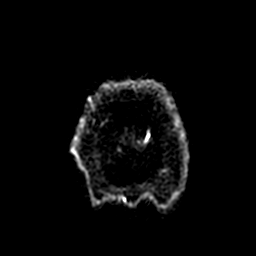

[Series 6: ax (id) 2 · axial · 1.2mm · 0.43mm/px · z∈[-98,-16]mm · 6 of 200 slices shown]
[im 1/200]
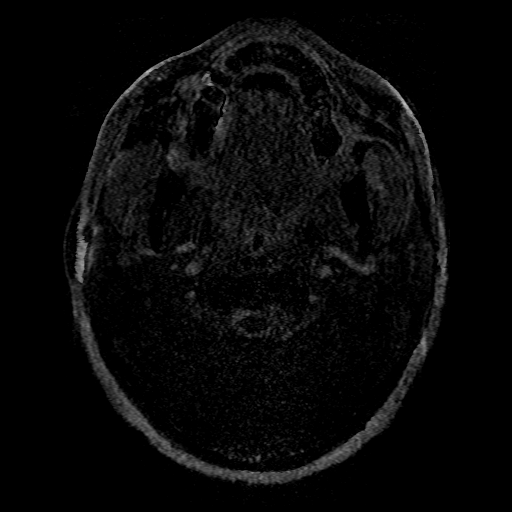
[im 31/200]
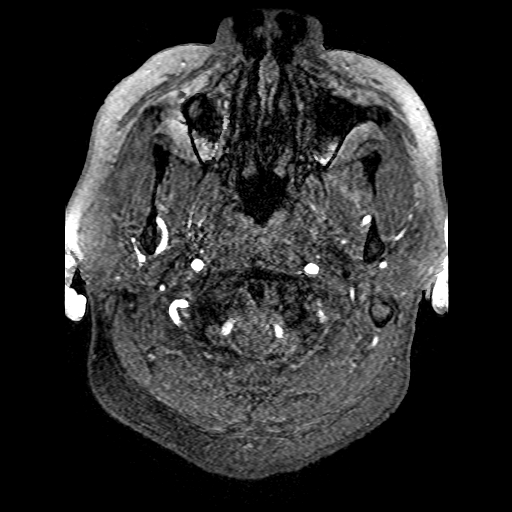
[im 62/200]
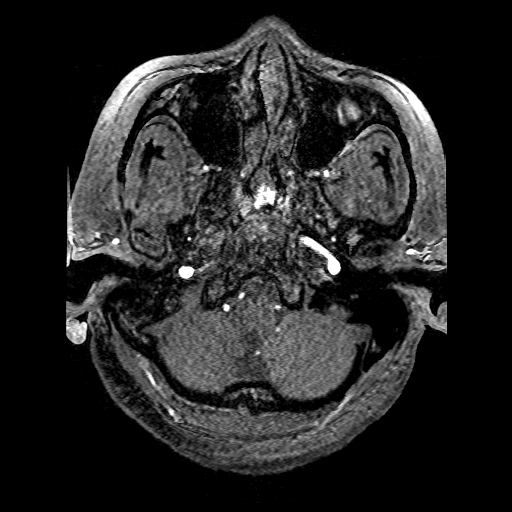
[im 92/200]
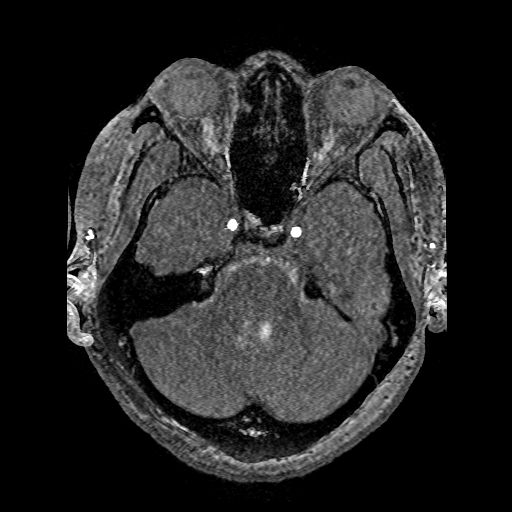
[im 108/200]
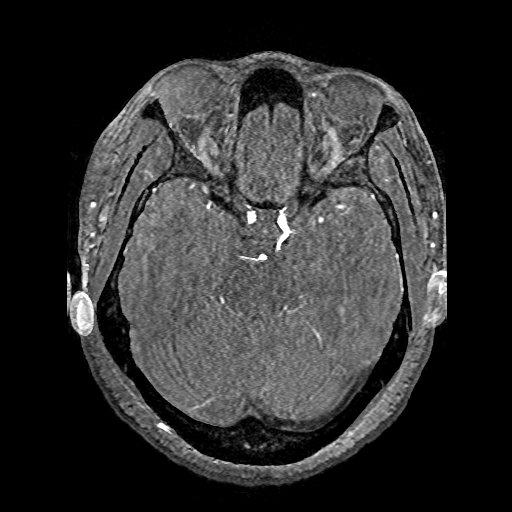
[im 138/200]
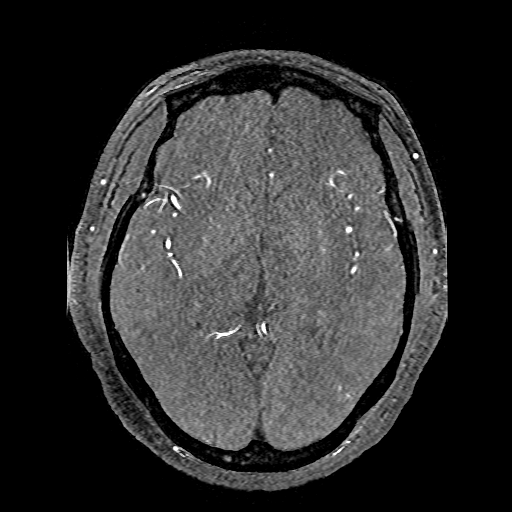

[Series 7: T2 · axial · 5.0mm · 0.47mm/px · z∈[-98,+32]mm · 2 of 23 slices shown (1 of 2)]
[im 1/23]
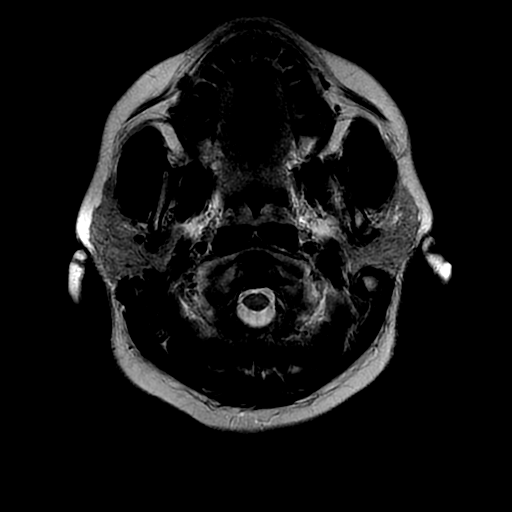
[im 23/23]
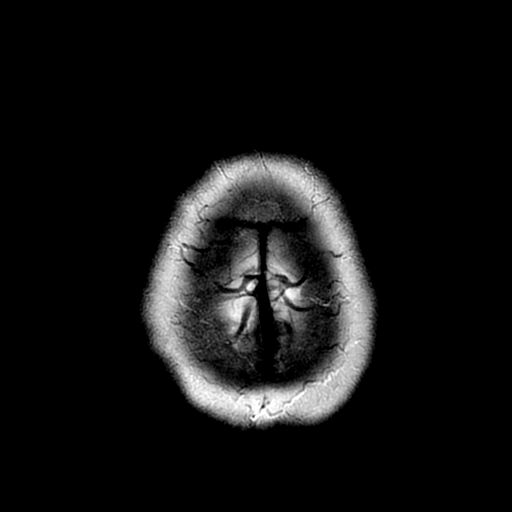

[Series 8: FLAIR · axial · 5.0mm · 0.47mm/px · z∈[-98,+32]mm · 2 of 23 slices shown (2 of 2)]
[im 1/23]
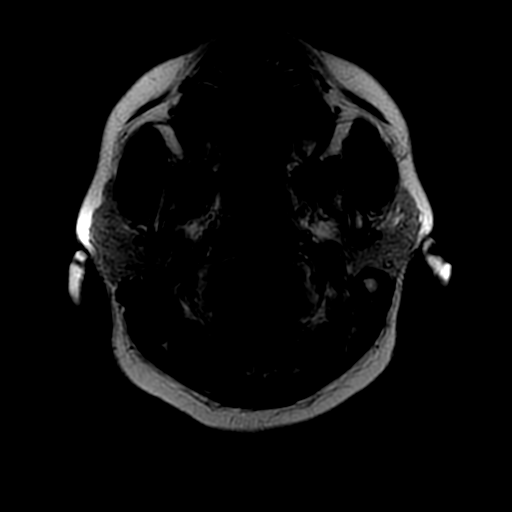
[im 23/23]
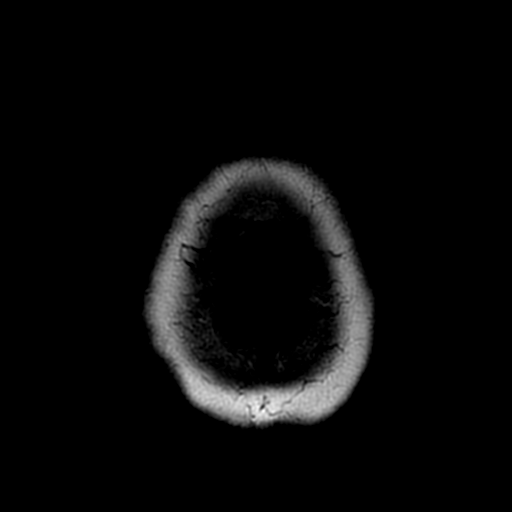

[Series 11: T2 · coronal · 5.0mm · 0.39mm/px · 2 of 26 slices shown (2 of 2)]
[im 1/26]
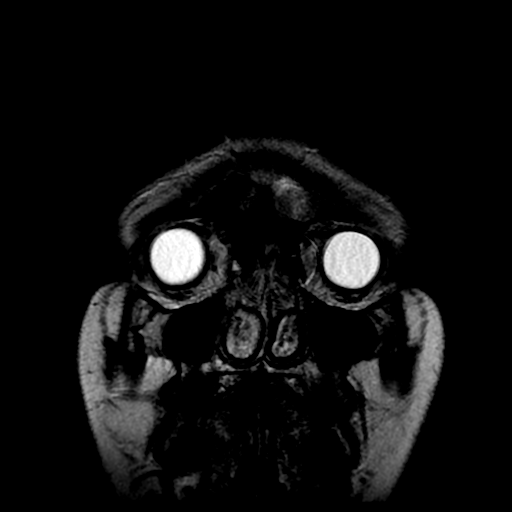
[im 26/26]
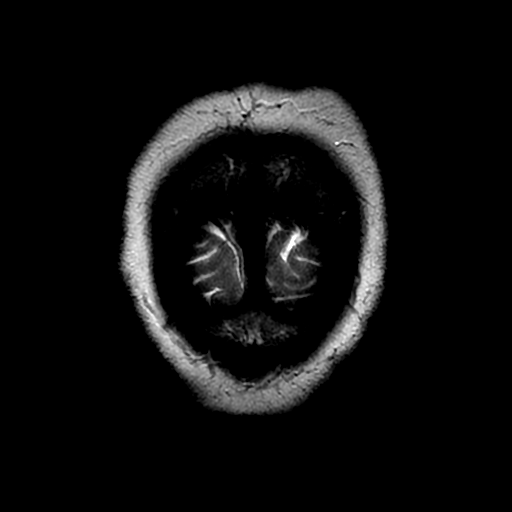

[Series 12: DWI · coronal · 5.0mm · 0.94mm/px · 4 of 62 slices shown (2 of 4)]
[im 1/62]
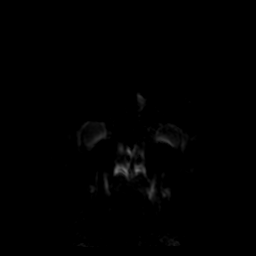
[im 21/62]
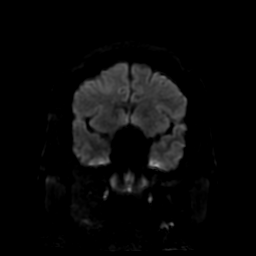
[im 41/62]
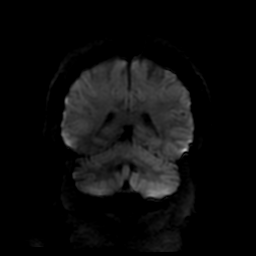
[im 62/62]
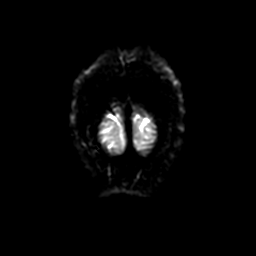

[Series 500: DWI · axial · 3.0mm · 0.94mm/px · z∈[-100,+33]mm · 3 of 46 slices shown (3 of 4)]
[im 1/46]
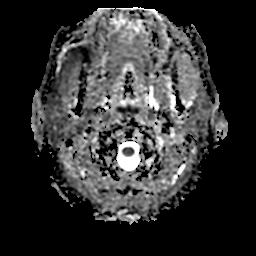
[im 23/46]
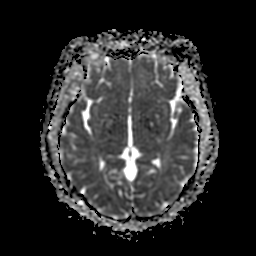
[im 46/46]
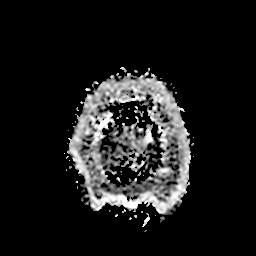

[Series 1200: DWI · coronal · 5.0mm · 0.94mm/px · 2 of 31 slices shown (4 of 4)]
[im 1/31]
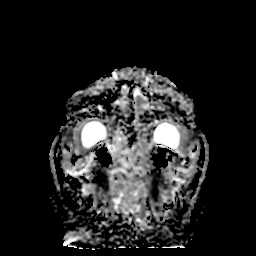
[im 31/31]
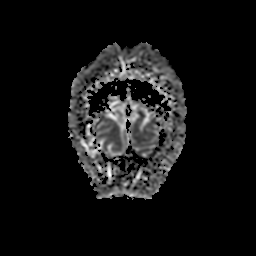

[28 of 48 positions shown; findings below may reference images not displayed]

FINDINGS: MRI HEAD FINDINGS

The ventricles and sulci are normal for patient's age. No reduced
diffusion to suggest acute ischemia. A few scattered subcentimeter
foci of susceptibility artifact in the periphery of the cerebrum.
Patchy to supratentorial white matter FLAIR T2 hyperintensities.
Additional small areas of encephalomalacia, corresponding to prior
infarct. Remote bilateral basal ganglia and thalamus tiny lacunar
infarcts were present previously. Focal T2 bright signal within the
anterior genu of the splenium consistent with remote ischemia. No
midline shift, mass effect or mass lesions.

No abnormal extra-axial fluid collections. No extra-axial masses
though, contrast enhanced sequences would be more sensitive. Normal
major intracranial vascular flow voids seen at the skull base.

Ocular globes and orbital contents are unremarkable though not
tailored for evaluation. No abnormal sellar expansion. Visualized
paranasal sinuses and mastoid air cells are well-aerated. No
suspicious calvarial bone marrow signal. No abnormal sellar
expansion. Craniocervical junction maintained.

MRA HEAD FINDINGS

Anterior circulation: Normal flow related enhancement of the
included cervical, petrous, cavernous and supra clinoid internal
carotid arteries. Patent anterior communicating artery. Chronically
occluded RIGHT anterior cerebral artery, thready irregular LEFT
anterior cerebral artery is patent, unchanged. Focal high-grade
stenosis LEFT M1 segment was present previously. Mild stenosis RIGHT
M1 segment.

No large vessel occlusion, high-grade stenosis, abnormal luminal
irregularity, aneurysm.

Posterior circulation: Codominant vertebral artery. Basilar artery
is patent, though the ready in irregular flow related enhancement
this appears worse than prior imaging though, partially limited by
motion artifact. With normal flow related enhancement of the main
branch vessels. Normal flow related enhancement of the posterior
cerebral arteries. Mild luminal regularity of the RIGHT P2 segment.
Moderate, worse stenosis LEFT P2 segment

No large vessel occlusion, high-grade stenosis, aneurysm.
IMPRESSION: MRI HEAD: No acute intracranial process, specifically no acute
infarct.

Multifocal small areas of encephalomalacia corresponding to prior
LEFT cerebral remote infarcts. Remote bilateral basal ganglia and
thalamus lacunar infarcts. Moderate white matter changes suggest
chronic small vessel ischemic disease.

MRA HEAD: Focal high-grade stenosis of LEFT M1 segment, stable.

Chronically occluded RIGHT anterior cerebral artery, with thready
irregular LEFT anterior cerebral artery, unchanged.

Thready, somewhat narrowed basilar artery, worse than prior
examination though this may be accentuated by motion artifact.
Moderate stenosis LEFT P2 segment, worse than prior imaging.

Improved appearance of the LEFT middle cerebral artery from prior
imaging, suggesting prior vasculitis.

By: Dongskie Momoland

## 2016-05-04 ENCOUNTER — Other Ambulatory Visit: Payer: BLUE CROSS/BLUE SHIELD

## 2016-05-15 ENCOUNTER — Telehealth: Payer: Self-pay | Admitting: Neurology

## 2016-05-15 MED ORDER — LAMOTRIGINE 25 MG PO TABS: ORAL_TABLET | ORAL | 3 refills | Status: DC

## 2016-05-15 NOTE — Telephone Encounter (Signed)
Called pt and she stated that she has HA at back of the head and goes to front for 3 weeks. Did not take percocet as I suggested. neurontin at night help sleep but not HA.   She in the past has tried topamax but stopped due to cognitive issue. Has tried inderal long time ago, not effective and currently she is on multiple BP meds. She has tried keppra, depakote and amitriptyline all not effective. Currently neurontin not able to control HA. Like to avoid imitrex due to stroke. Has tried occipital nerve block but not effective. Will try lamictal this time and I think will help her mood too. Continue neurontin. She expressed understanding and appreciation.   Marvel Plan, MD PhD Stroke Neurology 05/15/2016 7:14 PM  Meds ordered this encounter  Medications  . lamoTRIgine (LAMICTAL) 25 MG tablet    Sig: 25mg  Qhs x 2 wks, then 25mg  bid x 2 wks, then 25mg  am/50mg  pm x 2 wks, then 50mg  bid x 2 wks, then 50mg  am/100mg  pm x 2 wks, then 100mg  bid    Dispense:  240 tablet    Refill:  3

## 2016-05-15 NOTE — Telephone Encounter (Signed)
Patient called to advise, "Dr. Roda Shutters doesn't want her to take pain medicine that PCP prescribed, headache x 3 weeks, has taken Neurontin for over a month". Please call to advise.

## 2016-05-15 NOTE — Telephone Encounter (Signed)
Rn call patient back about her having a headache for three weeks. Pt stated she is only taking the neurontin liquid at night. PT stated she has perocet for headaches but was told by Dr. Roda Shutters to back off because it could be a rebound headache. PT stated the headache has been for 3 weeks. Message sent to Dr. Roda Shutters for advice.

## 2016-05-16 ENCOUNTER — Other Ambulatory Visit: Payer: Self-pay | Admitting: Internal Medicine

## 2016-05-16 DIAGNOSIS — Z1231 Encounter for screening mammogram for malignant neoplasm of breast: Secondary | ICD-10-CM

## 2016-06-01 ENCOUNTER — Ambulatory Visit (INDEPENDENT_AMBULATORY_CARE_PROVIDER_SITE_OTHER): Payer: BLUE CROSS/BLUE SHIELD

## 2016-06-01 DIAGNOSIS — I639 Cerebral infarction, unspecified: Secondary | ICD-10-CM

## 2016-06-01 DIAGNOSIS — R4701 Aphasia: Secondary | ICD-10-CM | POA: Diagnosis not present

## 2016-06-06 NOTE — Procedures (Signed)
   [facility]  EEG (ELECTROENCEPHALOGRAM) REPORT   STUDY DATE: 06/01/16 PATIENT NAME: Patricia [name] DOB: 1966/04/09 MRN: [id number]  ORDERING CLINICIAN: Marvel PlanJindong Xu, MD PhD   TECHNOLOGIST: Gearldine ShownLorraine Jones  TECHNIQUE: Electroencephalogram was recorded utilizing standard 10-20 system of lead placement and reformatted into average and bipolar montages.  RECORDING TIME: 22 minutes  ACTIVATION: hyperventilation and photic stimulation  CLINICAL INFORMATION: 50 year old female with right sided weakness and aphasia.  FINDINGS: Background rhythms of 9-10 hertz and 15-20 microvolts. No focal, lateralizing, epileptiform activity or seizures are seen. Patient recorded in the awake and drowsy state. EKG channel shows regular rhythm of 60 beats per minute.  IMPRESSION:  Normal EEG in the awake and drowsy state.    INTERPRETING PHYSICIAN:  Suanne MarkerVIKRAM R. Findlay Dagher, MD Certified in Neurology, Neurophysiology and Neuroimaging  [facility] [address], [address] [place], [place] [address] [phone]

## 2016-06-07 ENCOUNTER — Telehealth: Payer: Self-pay

## 2016-06-07 ENCOUNTER — Other Ambulatory Visit: Payer: Self-pay

## 2016-06-07 DIAGNOSIS — G44221 Chronic tension-type headache, intractable: Secondary | ICD-10-CM

## 2016-06-07 MED ORDER — GABAPENTIN 250 MG/5ML PO SOLN: 150.0000 mg | Freq: Three times a day (TID) | ORAL | 2 refills | Status: DC

## 2016-06-07 NOTE — Telephone Encounter (Signed)
Rn call patient about EEG. Rn gave results. During the conversation pt stated the gabapentin and lamictal was not working for her headaches. Pt has been prescribed several medications by Dr. Roda ShuttersXu for her headaches. PT stated the gabapentin is not in the bottle and she needs a higher dose. Rn stated a message will be sent to Dr. Roda ShuttersXu.

## 2016-06-09 NOTE — Telephone Encounter (Signed)
Talked to the pt over the phone and she stated that neurontin 300mg  at night just knock her out and she slept good but second day she still has the HA. She is on lamictal 25/50 now and next week goes to 50/50. She still did not see any effect of lamictal yet. She said she had one time botox injection by Dr. Terrace ArabiaYan before and not working either.   I told her that she has two options. One is to continue titrating lamictal to the target dose of 100mg  bid to see if it is effective. She needs one more month to reach that. The other is to have Dr. Lucia GaskinsAhern to see her for second opinion and give us some suggestions.   She would like to wait one more month to get lamictal up to target to see its effectiveness. If not effective, she will give us a call and we will refer her to see Dr Lucia GaskinsAhern for second opinion.   Patricia PlanJindong Elisheva Fallas, MD PhD Stroke Neurology 06/09/2016 7:00 PM

## 2016-06-13 ENCOUNTER — Other Ambulatory Visit: Payer: Self-pay | Admitting: Internal Medicine

## 2016-06-13 ENCOUNTER — Telehealth: Payer: Self-pay | Admitting: Neurology

## 2016-06-13 DIAGNOSIS — Z8673 Personal history of transient ischemic attack (TIA), and cerebral infarction without residual deficits: Secondary | ICD-10-CM

## 2016-06-13 DIAGNOSIS — R531 Weakness: Secondary | ICD-10-CM

## 2016-06-13 NOTE — Telephone Encounter (Signed)
Pt called to advise she is not able to move her rt arm and hand as normal and she feels like her tongue is thick since 06/09/16. Please call

## 2016-06-14 NOTE — Telephone Encounter (Signed)
Called pt back and she stated that since last Friday she felt her right hand getting weaker. She has to use left hand help right hand using chopsticks, for example. That is constant now. She also has some intermittent word finding difficulties since last week. It happens more frequent now.   She had intermittent word finding difficulty about 3 months ago with depression and crying episodes, MRI negative and it considered as stress related. Pt this time denies any stress, however, she just finished her vacation last week and came back to work this week. She said she can not afford not to work. She has to push herself through to work. She has seen her PCP and scheduled for MRI brain on Sunday.   Her recent EEG one week ago was normal and no seizure. I told her that if she felt symptoms getting worse and not able to wait for Sunday MRI, she can always go to ER for evaluation and get MRI right away. She expressed understanding and appreciation.  Patricia PlanJindong Leigha Olberding, MD PhD Stroke Neurology 06/14/2016 5:17 PM

## 2016-06-18 ENCOUNTER — Ambulatory Visit
Admission: RE | Admit: 2016-06-18 | Discharge: 2016-06-18 | Disposition: A | Discharge status: Home / Self Care | Payer: BLUE CROSS/BLUE SHIELD | Source: Ambulatory Visit | Attending: Internal Medicine | Admitting: Internal Medicine

## 2016-06-18 DIAGNOSIS — Z8673 Personal history of transient ischemic attack (TIA), and cerebral infarction without residual deficits: Secondary | ICD-10-CM

## 2016-06-18 DIAGNOSIS — R531 Weakness: Secondary | ICD-10-CM

## 2016-06-19 ENCOUNTER — Telehealth: Payer: Self-pay | Admitting: Neurology

## 2016-06-19 NOTE — Telephone Encounter (Signed)
Called pt back and delivered MRI result to her. MRI showed punctate left MCA stroke, same distribution as her stroke 2 years ago. She does have high grade stenosis of left M1, any hemodynamic changes of BP or brain perfusion can cause this change. Pt denies any low BP or dehydration, however.   Given her hx of migraine headache, and still has frequent HA, recurrent strokes, young age, female, Primary cerebral vasculitis needs to rule out. Will need cerebral angiogram (DSA) to be done with Dr. Corliss Skainseveshwar. Depends on the result, may consider LP. Pt stated that she had LP 2-3 years ago in [place], but we may have to repeat it this time if needed. Pt expressed understanding and appreciation.  Discussed with Dr. Corliss Skainseveshwar, he agrees to perform DSA this Thursday or Friday depends on schedule. Will let Victorino DikeJennifer to arrange this.   Marvel PlanJindong Doloros Kwolek, MD PhD Stroke Neurology 06/19/2016 5:56 PM

## 2016-06-19 NOTE — Telephone Encounter (Signed)
Pt called in requesting a call back from Dr. Roda ShuttersXu pertaining to her MRI results. Please call [phone]

## 2016-06-19 NOTE — Telephone Encounter (Signed)
Patients MRi was order by her PCP, not Dr. Roda ShuttersXu. PT wants Dr.Xu to look at the MRI and review it. Message was sent to Dr. Roda ShuttersXu.

## 2016-06-19 NOTE — Telephone Encounter (Signed)
error 

## 2016-06-20 ENCOUNTER — Other Ambulatory Visit (HOSPITAL_COMMUNITY): Payer: Self-pay | Admitting: Interventional Radiology

## 2016-06-20 DIAGNOSIS — I6320 Cerebral infarction due to unspecified occlusion or stenosis of unspecified precerebral arteries: Secondary | ICD-10-CM

## 2016-06-20 DIAGNOSIS — I771 Stricture of artery: Secondary | ICD-10-CM

## 2016-06-21 ENCOUNTER — Other Ambulatory Visit: Payer: Self-pay | Admitting: General Surgery

## 2016-06-21 ENCOUNTER — Other Ambulatory Visit: Payer: Self-pay | Admitting: Radiology

## 2016-06-23 ENCOUNTER — Encounter (HOSPITAL_COMMUNITY): Payer: Self-pay

## 2016-06-23 ENCOUNTER — Ambulatory Visit (HOSPITAL_COMMUNITY)
Admission: RE | Admit: 2016-06-23 | Discharge: 2016-06-23 | Disposition: A | Discharge status: Home / Self Care | Payer: BLUE CROSS/BLUE SHIELD | Source: Ambulatory Visit | Attending: Interventional Radiology | Admitting: Interventional Radiology

## 2016-06-23 ENCOUNTER — Other Ambulatory Visit (HOSPITAL_COMMUNITY): Payer: Self-pay | Admitting: Interventional Radiology

## 2016-06-23 DIAGNOSIS — E119 Type 2 diabetes mellitus without complications: Secondary | ICD-10-CM | POA: Diagnosis not present

## 2016-06-23 DIAGNOSIS — I10 Essential (primary) hypertension: Secondary | ICD-10-CM | POA: Insufficient documentation

## 2016-06-23 DIAGNOSIS — I6320 Cerebral infarction due to unspecified occlusion or stenosis of unspecified precerebral arteries: Secondary | ICD-10-CM

## 2016-06-23 DIAGNOSIS — I69320 Aphasia following cerebral infarction: Secondary | ICD-10-CM | POA: Diagnosis not present

## 2016-06-23 DIAGNOSIS — I69351 Hemiplegia and hemiparesis following cerebral infarction affecting right dominant side: Secondary | ICD-10-CM | POA: Diagnosis not present

## 2016-06-23 DIAGNOSIS — I771 Stricture of artery: Secondary | ICD-10-CM

## 2016-06-23 DIAGNOSIS — I6603 Occlusion and stenosis of bilateral middle cerebral arteries: Secondary | ICD-10-CM | POA: Insufficient documentation

## 2016-06-23 DIAGNOSIS — Z8249 Family history of ischemic heart disease and other diseases of the circulatory system: Secondary | ICD-10-CM | POA: Diagnosis not present

## 2016-06-23 DIAGNOSIS — G43909 Migraine, unspecified, not intractable, without status migrainosus: Secondary | ICD-10-CM | POA: Insufficient documentation

## 2016-06-23 DIAGNOSIS — J45909 Unspecified asthma, uncomplicated: Secondary | ICD-10-CM | POA: Insufficient documentation

## 2016-06-23 DIAGNOSIS — I6602 Occlusion and stenosis of left middle cerebral artery: Secondary | ICD-10-CM | POA: Diagnosis present

## 2016-06-23 HISTORY — PX: IR GENERIC HISTORICAL: IMG1180011

## 2016-06-23 LAB — PROTIME-INR
INR: 1.02
Prothrombin Time: 13.4 seconds (ref 11.4–15.2)

## 2016-06-23 LAB — GLUCOSE, CAPILLARY: GLUCOSE-CAPILLARY: 120 mg/dL — AB (ref 65–99)

## 2016-06-23 MED ORDER — FENTANYL CITRATE (PF) 100 MCG/2ML IJ SOLN
INTRAMUSCULAR | Status: AC | PRN
  Administered 2016-06-23 (×3): 25 ug via INTRAVENOUS

## 2016-06-23 MED ORDER — MIDAZOLAM HCL 2 MG/2ML IJ SOLN
INTRAMUSCULAR | Status: AC | PRN
  Administered 2016-06-23: 1 mg via INTRAVENOUS

## 2016-06-23 MED ORDER — LIDOCAINE HCL 1 % IJ SOLN
INTRAMUSCULAR | Status: AC
  Filled 2016-06-23: qty 20

## 2016-06-23 MED ORDER — MIDAZOLAM HCL 2 MG/2ML IJ SOLN
INTRAMUSCULAR | Status: AC
  Filled 2016-06-23: qty 2

## 2016-06-23 MED ORDER — HEPARIN SODIUM (PORCINE) 1000 UNIT/ML IJ SOLN
INTRAMUSCULAR | Status: AC
  Filled 2016-06-23: qty 2

## 2016-06-23 MED ORDER — HYDRALAZINE HCL 20 MG/ML IJ SOLN
INTRAMUSCULAR | Status: AC
  Filled 2016-06-23: qty 1

## 2016-06-23 MED ORDER — IOPAMIDOL (ISOVUE-300) INJECTION 61%
INTRAVENOUS | Status: AC
  Administered 2016-06-23: 80 mL
  Filled 2016-06-23: qty 150

## 2016-06-23 MED ORDER — FENTANYL CITRATE (PF) 100 MCG/2ML IJ SOLN
INTRAMUSCULAR | Status: AC
  Filled 2016-06-23: qty 2

## 2016-06-23 MED ORDER — SODIUM CHLORIDE 0.9 % IV SOLN: INTRAVENOUS | Status: AC

## 2016-06-23 MED ORDER — HYDROCODONE-ACETAMINOPHEN 5-325 MG PO TABS
ORAL_TABLET | ORAL | Status: AC
  Filled 2016-06-23: qty 2

## 2016-06-23 MED ORDER — HYDRALAZINE HCL 20 MG/ML IJ SOLN
INTRAMUSCULAR | Status: AC | PRN
  Administered 2016-06-23: 5 mg via INTRAVENOUS

## 2016-06-23 MED ORDER — HYDROCODONE-ACETAMINOPHEN 5-325 MG PO TABS: 1.0000 | ORAL_TABLET | ORAL | Status: DC | PRN

## 2016-06-23 MED ORDER — HEPARIN SODIUM (PORCINE) 1000 UNIT/ML IJ SOLN
INTRAMUSCULAR | Status: AC | PRN
  Administered 2016-06-23: 1000 [IU] via INTRAVENOUS

## 2016-06-23 MED ORDER — LIDOCAINE HCL 1 % IJ SOLN
INTRAMUSCULAR | Status: AC | PRN
  Administered 2016-06-23: 10 mL

## 2016-06-23 MED ORDER — SODIUM CHLORIDE 0.9 % IV SOLN
Freq: Once | INTRAVENOUS | Status: AC
  Administered 2016-06-23: 10:00:00 via INTRAVENOUS

## 2016-06-23 NOTE — H&P (Signed)
Chief Complaint: left MCA stenosis  Referring Physician:Dr. Marvel Plan  Supervising Physician: Julieanne Cotton  Patient Status:  Out-pt  HPI: Patricia [name] is an 50 y.o. female with a history of prior CVAs.  She is followed by Dr. Roda Shutters.  She has a potential allergy to Plavix and is on ASA and Persantine.  She has intermittent episodes now of right arm weakness, headaches, and slurred speech.  She had an MRA of her head over the weekend and was found to have high grade stenosis of her L MCA.  She has been set up for a diagnostic angiogram. She has no other complaints except what is listed above.  Past Medical History:  Past Medical History:  Diagnosis Date  . Asthma    " a long time ago"  . Bursitis   . Diabetes mellitus without complication (HCC)   . Headache    migraines  . Hypertension   . Rotator cuff tear    right  . Stroke [facility]) 2009   lasdt stroke July 2015  . Syncope and collapse     Past Surgical History:  Past Surgical History:  Procedure Laterality Date  . ABDOMINAL HYSTERECTOMY    . BREAST REDUCTION SURGERY    . CARDIAC CATHETERIZATION    . CARDIAC SURGERY  Closed hole in heart in 2009   Amplatzer Septal Occluder Ref [id number] (not listed in op notes, document scanned under MRI with written op notes  . HYSTEROTOMY    . SHOULDER ARTHROSCOPY WITH OPEN ROTATOR CUFF REPAIR AND DISTAL CLAVICLE ACROMINECTOMY Right 02/04/2015   Procedure: SHOULDER ARTHROSCOPY WITH SUBACROMIAL DECOMPRESSION AND OPEN ROTATOR CUFF REPAIR,;  Surgeon: Cammy Copa, MD;  Location: [facility] OR;  Service: Orthopedics;  Laterality: Right;  RIGHT SHOULDER DOA,SAD,MINI-OPEN ROTATOR CUFF TEAR REPAIR.    Family History:  Family History  Problem Relation Age of Onset  . Hypertension Mother   . Hypertension Father   . Cancer Maternal Aunt   . Cancer Maternal Uncle     Social History:  reports that she has never smoked. She has never used smokeless tobacco. She reports that she does  not drink alcohol or use drugs.  Allergies:  Allergies  Allergen Reactions  . Imitrex [Sumatriptan] Anaphylaxis  . Plavix [Clopidogrel Bisulfate] Hives  . Lipitor [Atorvastatin] Rash    Medications: Medications reviewed  Please HPI for pertinent positives, otherwise complete 10 system ROS negative.  Mallampati Score: MD Evaluation Airway: WNL Heart: WNL Abdomen: WNL Chest/ Lungs: WNL ASA  Classification: 3 Mallampati/Airway Score: Two  Physical Exam: BP (!) 137/95   Pulse 72   Temp 98.4 F (36.9 C) (Oral)   Resp 18   Ht 5\' 6"  (1.676 m)   Wt 224 lb (101.6 kg)   SpO2 98%   BMI 36.15 kg/m  Body mass index is 36.15 kg/m. General: pleasant, obese, black female who is laying in bed in NAD HEENT: head is normocephalic, atraumatic.  Sclera are noninjected.  PERRL.  Ears and nose without any masses or lesions.  Mouth is pink and moist Heart: regular, rate, and rhythm.  Normal s1,s2. No obvious murmurs, gallops, or rubs noted.  Palpable radial and pedal pulses bilaterally Lungs: CTAB, no wheezes, rhonchi, or rales noted.  Respiratory effort nonlabored Abd: soft, NT, obese, +BS, no masses, hernias, or organomegaly MS: all 4 extremities are symmetrical with no cyanosis, clubbing, or edema. Psych: A&Ox3 with an appropriate affect.   Labs: Results for orders placed or performed during the hospital  encounter of 06/23/16 (from the past 48 hour(s))  Glucose, capillary     Status: Abnormal   Collection Time: 06/23/16  9:07 AM  Result Value Ref Range   Glucose-Capillary 120 (H) 65 - 99 mg/dL    Imaging: No results found.  Assessment/Plan 1. Left MCA stenosis -we will proceed today with diagnostic angiogram -labs have been scanned in from recently and have been reviewed -Risks and Benefits discussed with the patient including, but not limited to bleeding, infection, vascular injury or contrast induced renal failure. All of the patient's questions were answered, patient is  agreeable to proceed. Consent signed and in chart.   Thank you for this interesting consult.  I greatly enjoyed meeting Patricia [name] and look forward to participating in their care.  A copy of this report was sent to the requesting provider on this date.  Electronically Signed: Letha CapeSBORNE,Tien Spooner E 06/23/2016, 9:59 AM   I spent a total of  30 Minutes   in face to face in clinical consultation, greater than 50% of which was counseling/coordinating care for L MCA stenosis

## 2016-06-23 NOTE — Discharge Instructions (Signed)
Angiogram, Care After °Refer to this sheet in the next few weeks. These instructions provide you with information about caring for yourself after your procedure. Your health care provider may also give you more specific instructions. Your treatment has been planned according to current medical practices, but problems sometimes occur. Call your health care provider if you have any problems or questions after your procedure. °WHAT TO EXPECT AFTER THE PROCEDURE °After your procedure, it is typical to have the following: °· Bruising at the catheter insertion site that usually fades within 1-2 weeks. °· Blood collecting in the tissue (hematoma) that may be painful to the touch. It should usually decrease in size and tenderness within 1-2 weeks. °HOME CARE INSTRUCTIONS °· Take medicines only as directed by your health care provider. °· You may shower 24-48 hours after the procedure or as directed by your health care provider. Remove the bandage (dressing) and gently wash the site with plain soap and water. Pat the area dry with a clean towel. Do not rub the site, because this may cause bleeding. °· Do not take baths, swim, or use a hot tub until your health care provider approves. °· Check your insertion site every day for redness, swelling, or drainage. °· Do not apply powder or lotion to the site. °· Do not lift over 10 lb (4.5 kg) for 5 days after your procedure or as directed by your health care provider. °· Ask your health care provider when it is okay to: °¨ Return to work or school. °¨ Resume usual physical activities or sports. °¨ Resume sexual activity. °· Do not drive home if you are discharged the same day as the procedure. Have someone else drive you. °· You may drive 24 hours after the procedure unless otherwise instructed by your health care provider. °· Do not operate machinery or power tools for 24 hours after the procedure or as directed by your health care provider. °· If your procedure was done as an  outpatient procedure, which means that you went home the same day as your procedure, a responsible adult should be with you for the first 24 hours after you arrive home. °· Keep all follow-up visits as directed by your health care provider. This is important. °SEEK MEDICAL CARE IF: °· You have a fever. °· You have chills. °· You have increased bleeding from the catheter insertion site. Hold pressure on the site. °SEEK IMMEDIATE MEDICAL CARE IF: °· You have unusual pain at the catheter insertion site. °· You have redness, warmth, or swelling at the catheter insertion site. °· You have drainage (other than a small amount of blood on the dressing) from the catheter insertion site. °· The catheter insertion site is bleeding, and the bleeding does not stop after 30 minutes of holding steady pressure on the site. °· The area near or just beyond the catheter insertion site becomes pale, cool, tingly, or numb. °  °This information is not intended to replace advice given to you by your health care provider. Make sure you discuss any questions you have with your health care provider. °  °Document Released: 04/27/2005 Document Revised: 10/30/2014 Document Reviewed: 03/12/2013 °Elsevier Interactive Patient Education ©2016 [employer] ° °

## 2016-06-23 NOTE — Sedation Documentation (Signed)
Patient is resting comfortably. 

## 2016-06-23 NOTE — Sedation Documentation (Signed)
Patient denies pain and is resting comfortably.  

## 2016-06-23 NOTE — Procedures (Signed)
S/P 4 vessel cerebral arteriogram. RT CFA approach. Findings. 1.preocclusive Lt MCA prox stenosis.95% plus . 2.hypoplastic ACAs

## 2016-06-27 ENCOUNTER — Other Ambulatory Visit (HOSPITAL_COMMUNITY): Payer: Self-pay | Admitting: Interventional Radiology

## 2016-06-27 DIAGNOSIS — I771 Stricture of artery: Secondary | ICD-10-CM

## 2016-06-28 ENCOUNTER — Encounter (HOSPITAL_COMMUNITY): Payer: Self-pay | Admitting: Interventional Radiology

## 2016-06-29 ENCOUNTER — Other Ambulatory Visit (HOSPITAL_COMMUNITY): Payer: Self-pay | Admitting: Interventional Radiology

## 2016-06-29 ENCOUNTER — Other Ambulatory Visit: Payer: Self-pay | Admitting: General Surgery

## 2016-06-29 ENCOUNTER — Ambulatory Visit (HOSPITAL_COMMUNITY)
Admission: RE | Admit: 2016-06-29 | Discharge: 2016-06-29 | Disposition: A | Discharge status: Home / Self Care | Payer: BLUE CROSS/BLUE SHIELD | Source: Ambulatory Visit | Attending: Interventional Radiology | Admitting: Interventional Radiology

## 2016-06-29 DIAGNOSIS — I771 Stricture of artery: Secondary | ICD-10-CM

## 2016-06-29 HISTORY — PX: IR GENERIC HISTORICAL: IMG1180011

## 2016-06-30 ENCOUNTER — Encounter (HOSPITAL_COMMUNITY): Payer: Self-pay | Admitting: Emergency Medicine

## 2016-06-30 ENCOUNTER — Encounter (HOSPITAL_COMMUNITY): Payer: Self-pay | Admitting: *Deleted

## 2016-06-30 NOTE — Progress Notes (Signed)
Pt denies SOB and chest pain but is under the care of Dr. Jacinto HalimGanji, Cardiology. Pt stated that she had a stress test performed > 5 years ago by Dr. Jacinto HalimGanji.  Pt stated that she takes her Aspirin in the evening and was started on Brilinta. Pt stated that her fasting blood glucose usually ranges between 95-115. Pt made aware of diabetes protocol to check BS every 2 hours prior to arrival, interventions for a BS< 70 and instructed to take no diabetes medication the morning of procedure Kirk Ruths( Synjardy). Pt made aware to stop taking vitamins, fish oil, herbal medications and NSAID's. Pt verbalized understanding of all pre-op instructions.

## 2016-07-03 ENCOUNTER — Ambulatory Visit (HOSPITAL_COMMUNITY): Admission: RE | Admit: 2016-07-03 | Payer: BLUE CROSS/BLUE SHIELD | Source: Ambulatory Visit

## 2016-07-03 ENCOUNTER — Encounter (HOSPITAL_COMMUNITY)
Admission: RE | Disposition: A | Discharge status: Home / Self Care | Payer: Self-pay | Source: Ambulatory Visit | Attending: Interventional Radiology

## 2016-07-03 ENCOUNTER — Encounter (HOSPITAL_COMMUNITY): Payer: Self-pay

## 2016-07-03 ENCOUNTER — Encounter (HOSPITAL_COMMUNITY): Payer: Self-pay | Admitting: Certified Registered"

## 2016-07-03 ENCOUNTER — Ambulatory Visit (HOSPITAL_COMMUNITY)
Admission: RE | Admit: 2016-07-03 | Discharge: 2016-07-03 | Disposition: A | Discharge status: Home / Self Care | Payer: BLUE CROSS/BLUE SHIELD | Source: Ambulatory Visit | Attending: Interventional Radiology | Admitting: Interventional Radiology

## 2016-07-03 DIAGNOSIS — I10 Essential (primary) hypertension: Secondary | ICD-10-CM | POA: Insufficient documentation

## 2016-07-03 DIAGNOSIS — Z7982 Long term (current) use of aspirin: Secondary | ICD-10-CM | POA: Insufficient documentation

## 2016-07-03 DIAGNOSIS — Z7902 Long term (current) use of antithrombotics/antiplatelets: Secondary | ICD-10-CM | POA: Insufficient documentation

## 2016-07-03 DIAGNOSIS — Z8673 Personal history of transient ischemic attack (TIA), and cerebral infarction without residual deficits: Secondary | ICD-10-CM | POA: Diagnosis not present

## 2016-07-03 DIAGNOSIS — Z5309 Procedure and treatment not carried out because of other contraindication: Secondary | ICD-10-CM | POA: Insufficient documentation

## 2016-07-03 DIAGNOSIS — E119 Type 2 diabetes mellitus without complications: Secondary | ICD-10-CM | POA: Diagnosis not present

## 2016-07-03 DIAGNOSIS — I6602 Occlusion and stenosis of left middle cerebral artery: Secondary | ICD-10-CM | POA: Diagnosis present

## 2016-07-03 DIAGNOSIS — Z79899 Other long term (current) drug therapy: Secondary | ICD-10-CM | POA: Diagnosis not present

## 2016-07-03 LAB — BASIC METABOLIC PANEL
ANION GAP: 7 (ref 5–15)
BUN: 19 mg/dL (ref 6–20)
CALCIUM: 9.7 mg/dL (ref 8.9–10.3)
CO2: 26 mmol/L (ref 22–32)
CREATININE: 1.27 mg/dL — AB (ref 0.44–1.00)
Chloride: 108 mmol/L (ref 101–111)
GFR, EST AFRICAN AMERICAN: 56 mL/min — AB (ref >60)
GFR, EST NON AFRICAN AMERICAN: 49 mL/min — AB (ref >60)
GLUCOSE: 134 mg/dL — AB (ref 65–99)
Potassium: 3.9 mmol/L (ref 3.5–5.1)
Sodium: 141 mmol/L (ref 135–145)

## 2016-07-03 LAB — CBC WITH DIFFERENTIAL/PLATELET
BASOS ABS: 0 10*3/uL (ref 0.0–0.1)
BASOS PCT: 0 %
EOS ABS: 0.1 10*3/uL (ref 0.0–0.7)
Eosinophils Relative: 1 %
HEMATOCRIT: 40.9 % (ref 36.0–46.0)
Hemoglobin: 13.1 g/dL (ref 12.0–15.0)
Lymphocytes Relative: 37 %
Lymphs Abs: 2.8 10*3/uL (ref 0.7–4.0)
MCH: 28.1 pg (ref 26.0–34.0)
MCHC: 32 g/dL (ref 30.0–36.0)
MCV: 87.6 fL (ref 78.0–100.0)
MONO ABS: 0.5 10*3/uL (ref 0.1–1.0)
MONOS PCT: 6 %
NEUTROS ABS: 4.1 10*3/uL (ref 1.7–7.7)
Neutrophils Relative %: 56 %
PLATELETS: 233 10*3/uL (ref 150–400)
RBC: 4.67 MIL/uL (ref 3.87–5.11)
RDW: 13.1 % (ref 11.5–15.5)
WBC: 7.4 10*3/uL (ref 4.0–10.5)

## 2016-07-03 LAB — PROTIME-INR
INR: 0.93
PROTHROMBIN TIME: 12.5 s (ref 11.4–15.2)

## 2016-07-03 LAB — PLATELET INHIBITION P2Y12: PLATELET FUNCTION P2Y12: 2 [PRU] — AB (ref 194–418)

## 2016-07-03 LAB — APTT: APTT: 27 s (ref 24–36)

## 2016-07-03 SURGERY — RADIOLOGY WITH ANESTHESIA
Anesthesia: General

## 2016-07-03 MED ORDER — NIMODIPINE 30 MG PO CAPS
0.0000 mg | ORAL_CAPSULE | ORAL | Status: DC
  Filled 2016-07-03: qty 2

## 2016-07-03 MED ORDER — SODIUM CHLORIDE 0.9 % IV SOLN: INTRAVENOUS | Status: DC

## 2016-07-03 MED ORDER — ASPIRIN EC 325 MG PO TBEC
325.0000 mg | DELAYED_RELEASE_TABLET | ORAL | Status: DC
  Filled 2016-07-03: qty 1

## 2016-07-03 MED ORDER — CEFAZOLIN SODIUM-DEXTROSE 2-4 GM/100ML-% IV SOLN
2.0000 g | INTRAVENOUS | Status: DC
  Filled 2016-07-03 (×2): qty 100

## 2016-07-03 NOTE — Progress Notes (Signed)
Per Dr. Corliss Skainseveshwar, procedure canceled due to abnormal labs.  PA came to speak with patient prior to discharge.  Patient ambulated to main entrance without assistance.

## 2016-07-03 NOTE — Anesthesia Preprocedure Evaluation (Deleted)
Anesthesia Evaluation  Patient identified by MRN, date of birth, ID band Patient awake    Reviewed: Allergy & Precautions, NPO status , Patient's Chart, lab work & pertinent test results, reviewed documented beta blocker date and time   Airway        Dental   Pulmonary asthma ,           Cardiovascular hypertension, Pt. on medications and Pt. on home beta blockers      Neuro/Psych TIACVA    GI/Hepatic negative GI ROS, Neg liver ROS,   Endo/Other  diabetes  Renal/GU Renal disease     Musculoskeletal   Abdominal   Peds  Hematology negative hematology ROS (+)   Anesthesia Other Findings   Reproductive/Obstetrics                             Lab Results  Component Value Date   WBC 7.4 07/03/2016   HGB 13.1 07/03/2016   HCT 40.9 07/03/2016   MCV 87.6 07/03/2016   PLT 233 07/03/2016   Lab Results  Component Value Date   CREATININE 1.27 (H) 07/03/2016   BUN 19 07/03/2016   NA 141 07/03/2016   K 3.9 07/03/2016   CL 108 07/03/2016   CO2 26 07/03/2016    Anesthesia Physical Anesthesia Plan  ASA: III  Anesthesia Plan: General   Post-op Pain Management:    Induction: Intravenous  Airway Management Planned: Oral ETT  Additional Equipment: Arterial line  Intra-op Plan:   Post-operative Plan: Extubation in OR  Informed Consent: I have reviewed the patients History and Physical, chart, labs and discussed the procedure including the risks, benefits and alternatives for the proposed anesthesia with the patient or authorized representative who has indicated his/her understanding and acceptance.   Dental advisory given  Plan Discussed with: CRNA  Anesthesia Plan Comments: (Procedure cancelled due to anticoagulation issues.)       Anesthesia Quick Evaluation

## 2016-07-07 ENCOUNTER — Other Ambulatory Visit: Payer: Self-pay | Admitting: Radiology

## 2016-07-07 ENCOUNTER — Other Ambulatory Visit: Payer: Self-pay | Admitting: General Surgery

## 2016-07-07 ENCOUNTER — Telehealth (HOSPITAL_COMMUNITY): Payer: Self-pay | Admitting: Radiology

## 2016-07-07 LAB — PLATELET INHIBITION P2Y12: Platelet Function  P2Y12: 21 [PRU] — ABNORMAL LOW (ref 194–418)

## 2016-07-07 NOTE — Telephone Encounter (Signed)
Spoke to pt and per Deveshwar she is to continue her Brilinta 90mg  in the morning and 45mg  in the evening. She is to change her Aspirin from 325mg  to 81mg  daily. Pt expressed understanding and is in agreement with this plan. JM

## 2016-07-12 ENCOUNTER — Encounter (HOSPITAL_COMMUNITY): Payer: Self-pay | Admitting: *Deleted

## 2016-07-12 MED ORDER — NIMODIPINE 30 MG PO CAPS: 0.0000 mg | ORAL_CAPSULE | ORAL | Status: DC

## 2016-07-12 MED ORDER — SODIUM CHLORIDE 0.9 % IV SOLN: INTRAVENOUS | Status: DC

## 2016-07-12 MED ORDER — ASPIRIN EC 325 MG PO TBEC: 325.0000 mg | DELAYED_RELEASE_TABLET | ORAL | Status: DC

## 2016-07-12 MED ORDER — CEFAZOLIN SODIUM-DEXTROSE 2-4 GM/100ML-% IV SOLN: 2.0000 g | INTRAVENOUS | Status: DC

## 2016-07-12 NOTE — Progress Notes (Signed)
Pt called this evening stating that she had received a text from Dr. Cindee LameMoriera (who she works with) that the machine that runs the p2y12 is down. She states she doesn't know if she should still come in the AM. I paged Dr. Corliss Skainseveshwar and he returned my page and he stated that pt still needs to come in the AM and to continue to take her medications as instructed. I called pt back and informed her of this. She states she will be here.

## 2016-07-12 NOTE — Progress Notes (Signed)
Spoke with pt for pre-op call. Pt has hx of PFO and it was surgically repaired as an adult. Dr. Jacinto HalimGanji is her cardiologist. She denies any recent chest pain or sob. Pt is diabetic. Last A1C was 7.3 on 06/13/16. Pt states fasting blood sugar runs around 132-147. Instructed pt to take 1/2 of her regular dose of Soliqua Insulin this evening (will be 16 units). Instructed her to check blood sugar in AM  If blood sugar is 70 or below, treat with 1/2 cup of clear juice (apple or cranberry) and recheck blood sugar 15 minutes after drinking juice. If blood sugar continues to be 70 or below, call the Short Stay department and ask to speak to a nurse.

## 2016-07-13 ENCOUNTER — Inpatient Hospital Stay (HOSPITAL_COMMUNITY): Payer: BLUE CROSS/BLUE SHIELD | Admitting: Certified Registered Nurse Anesthetist

## 2016-07-13 ENCOUNTER — Telehealth (HOSPITAL_COMMUNITY): Payer: Self-pay | Admitting: Radiology

## 2016-07-13 ENCOUNTER — Other Ambulatory Visit: Payer: Self-pay | Admitting: Radiology

## 2016-07-13 ENCOUNTER — Encounter (HOSPITAL_COMMUNITY)
Admission: RE | Disposition: A | Discharge status: Home / Self Care | Payer: Self-pay | Source: Ambulatory Visit | Attending: Interventional Radiology

## 2016-07-13 ENCOUNTER — Ambulatory Visit (HOSPITAL_COMMUNITY)
Admission: RE | Admit: 2016-07-13 | Discharge: 2016-07-13 | Disposition: A | Discharge status: Home / Self Care | Payer: BLUE CROSS/BLUE SHIELD | Source: Ambulatory Visit | Attending: Interventional Radiology | Admitting: Interventional Radiology

## 2016-07-13 ENCOUNTER — Encounter (HOSPITAL_COMMUNITY): Payer: Self-pay | Admitting: Certified Registered Nurse Anesthetist

## 2016-07-13 ENCOUNTER — Encounter (HOSPITAL_COMMUNITY): Payer: Self-pay | Admitting: *Deleted

## 2016-07-13 ENCOUNTER — Other Ambulatory Visit (HOSPITAL_COMMUNITY): Payer: Self-pay | Admitting: *Deleted

## 2016-07-13 DIAGNOSIS — K219 Gastro-esophageal reflux disease without esophagitis: Secondary | ICD-10-CM | POA: Insufficient documentation

## 2016-07-13 DIAGNOSIS — Z01812 Encounter for preprocedural laboratory examination: Secondary | ICD-10-CM | POA: Insufficient documentation

## 2016-07-13 DIAGNOSIS — Z79899 Other long term (current) drug therapy: Secondary | ICD-10-CM | POA: Diagnosis not present

## 2016-07-13 DIAGNOSIS — Z9889 Other specified postprocedural states: Secondary | ICD-10-CM | POA: Insufficient documentation

## 2016-07-13 DIAGNOSIS — I6603 Occlusion and stenosis of bilateral middle cerebral arteries: Secondary | ICD-10-CM | POA: Diagnosis present

## 2016-07-13 DIAGNOSIS — Z01810 Encounter for preprocedural cardiovascular examination: Secondary | ICD-10-CM | POA: Insufficient documentation

## 2016-07-13 DIAGNOSIS — Z7982 Long term (current) use of aspirin: Secondary | ICD-10-CM | POA: Insufficient documentation

## 2016-07-13 DIAGNOSIS — J45909 Unspecified asthma, uncomplicated: Secondary | ICD-10-CM | POA: Diagnosis not present

## 2016-07-13 DIAGNOSIS — E119 Type 2 diabetes mellitus without complications: Secondary | ICD-10-CM | POA: Diagnosis not present

## 2016-07-13 DIAGNOSIS — F329 Major depressive disorder, single episode, unspecified: Secondary | ICD-10-CM | POA: Diagnosis not present

## 2016-07-13 DIAGNOSIS — Z8673 Personal history of transient ischemic attack (TIA), and cerebral infarction without residual deficits: Secondary | ICD-10-CM | POA: Insufficient documentation

## 2016-07-13 HISTORY — DX: Major depressive disorder, single episode, unspecified: F32.9

## 2016-07-13 HISTORY — DX: Patent foramen ovale: Q21.12

## 2016-07-13 HISTORY — DX: Gastro-esophageal reflux disease without esophagitis: K21.9

## 2016-07-13 HISTORY — DX: Depression, unspecified: F32.A

## 2016-07-13 HISTORY — DX: Anemia, unspecified: D64.9

## 2016-07-13 HISTORY — DX: Calculus of kidney: N20.0

## 2016-07-13 HISTORY — DX: Atrial septal defect: Q21.1

## 2016-07-13 LAB — COMPREHENSIVE METABOLIC PANEL
ALK PHOS: 115 U/L (ref 38–126)
ALT: 17 U/L (ref 14–54)
ANION GAP: 6 (ref 5–15)
AST: 20 U/L (ref 15–41)
Albumin: 3.8 g/dL (ref 3.5–5.0)
BUN: 12 mg/dL (ref 6–20)
CALCIUM: 9.6 mg/dL (ref 8.9–10.3)
CO2: 29 mmol/L (ref 22–32)
CREATININE: 1.17 mg/dL — AB (ref 0.44–1.00)
Chloride: 106 mmol/L (ref 101–111)
GFR, EST NON AFRICAN AMERICAN: 54 mL/min — AB (ref >60)
Glucose, Bld: 110 mg/dL — ABNORMAL HIGH (ref 65–99)
Potassium: 4 mmol/L (ref 3.5–5.1)
SODIUM: 141 mmol/L (ref 135–145)
Total Bilirubin: 0.5 mg/dL (ref 0.3–1.2)
Total Protein: 7.2 g/dL (ref 6.5–8.1)

## 2016-07-13 LAB — PROTIME-INR
INR: 1
PROTHROMBIN TIME: 13.2 s (ref 11.4–15.2)

## 2016-07-13 LAB — CBC WITH DIFFERENTIAL/PLATELET
Basophils Absolute: 0 10*3/uL (ref 0.0–0.1)
Basophils Relative: 0 %
EOS ABS: 0.1 10*3/uL (ref 0.0–0.7)
EOS PCT: 2 %
HCT: 40.5 % (ref 36.0–46.0)
HEMOGLOBIN: 12.7 g/dL (ref 12.0–15.0)
LYMPHS ABS: 2.2 10*3/uL (ref 0.7–4.0)
LYMPHS PCT: 31 %
MCH: 28 pg (ref 26.0–34.0)
MCHC: 31.4 g/dL (ref 30.0–36.0)
MCV: 89.2 fL (ref 78.0–100.0)
MONOS PCT: 9 %
Monocytes Absolute: 0.6 10*3/uL (ref 0.1–1.0)
Neutro Abs: 4.1 10*3/uL (ref 1.7–7.7)
Neutrophils Relative %: 58 %
PLATELETS: 233 10*3/uL (ref 150–400)
RBC: 4.54 MIL/uL (ref 3.87–5.11)
RDW: 13.5 % (ref 11.5–15.5)
WBC: 7.1 10*3/uL (ref 4.0–10.5)

## 2016-07-13 LAB — APTT: aPTT: 29 seconds (ref 24–36)

## 2016-07-13 LAB — PLATELET INHIBITION P2Y12: PLATELET FUNCTION P2Y12: 9 [PRU] — AB (ref 194–418)

## 2016-07-13 SURGERY — RADIOLOGY WITH ANESTHESIA
Anesthesia: General

## 2016-07-13 NOTE — Telephone Encounter (Signed)
Called pt, [name]. I told her that her procedure would be canceled tomorrow. She is to change her Brilinta to 45mg  b.i.d. And continue Aspirin 81mg  daily and recheck her [name] on 07/17/16. We will call her if the [name] machine is still not working on 07/17/16. Pt is very frustrated and concerned. Her procedure has now been canceled twice. The lab was unable to perform a [name] this morning prior to her procedure. JM

## 2016-07-13 NOTE — Anesthesia Preprocedure Evaluation (Signed)
Anesthesia Evaluation    Airway        Dental   Pulmonary asthma ,           Cardiovascular hypertension,      Neuro/Psych  Headaches,    GI/Hepatic GERD  ,  Endo/Other  diabetes  Renal/GU Renal disease     Musculoskeletal   Abdominal   Peds  Hematology   Anesthesia Other Findings   Reproductive/Obstetrics                             Anesthesia Physical Anesthesia Plan  ASA: III  Anesthesia Plan:    Post-op Pain Management:    Induction:   Airway Management Planned:   Additional Equipment:   Intra-op Plan:   Post-operative Plan:   Informed Consent:   Plan Discussed with: CRNA and Anesthesiologist  Anesthesia Plan Comments:         Anesthesia Quick Evaluation

## 2016-07-13 NOTE — Progress Notes (Signed)
Patient ID: Patricia [name], female   DOB: December 02, 1965, 50 y.o.   MRN: [id number]   [name]  Will reschedule this pt asap per Dr Corliss Skainseveshwar Pt aware and agreeable

## 2016-07-13 NOTE — H&P (Signed)
Chief Complaint: Patient was seen in consultation today for cerebral arteriogram with possible Left middle cerebral artery angioplasty/stent placement at the request of Dr Ralene Ok  Referring Physician(s): Dr Ralene Ok  Supervising Physician: Julieanne Cotton  Patient Status: Outpatient  History of Present Illness: Patricia [name] is a 50 y.o. female   Hx CVA Word finding issues Weakness Rt arm/hand Rt sided headache Cerebral arteriogram 9/1: IMPRESSION: Severe pre occlusive 95% stenoses of the left middle cerebral artery M1 segment with decreased hemodynamic ascent of contrast in the MCA distribution. This is responsible for the patient's symptoms as described. Approximately 50% stenosis of the right middle cerebral artery M1 segment.  Was seen by Dr Corliss Skains for possible intervention Now scheduled for probable angioplasty/stent   Past Medical History:  Diagnosis Date  . Anemia    in the past  . Asthma    " a long time ago"  . Bursitis   . Depression    13 years ago -   . Diabetes mellitus without complication (HCC)    type  2  . GERD (gastroesophageal reflux disease)    occ. acid reflux  . Headache    migraines  . Hypertension   . Kidney stones   . PFO (patent foramen ovale)    hx of PFO with surgery done as an adult  . Rotator cuff tear    right  . Stroke [facility]) 2009   lasdt stroke July 2015  . Syncope and collapse     Past Surgical History:  Procedure Laterality Date  . ABDOMINAL HYSTERECTOMY    . BREAST REDUCTION SURGERY    . CARDIAC CATHETERIZATION    . CARDIAC SURGERY  Closed hole in heart in 2009   Amplatzer Septal Occluder Ref [id number] (not listed in op notes, document scanned under MRI with written op notes  . HYSTEROTOMY    . IR GENERIC HISTORICAL  06/23/2016   IR ANGIO INTRA EXTRACRAN SEL COM CAROTID INNOMINATE BILAT MOD SED 06/23/2016 Julieanne Cotton, MD [facility]  . IR GENERIC HISTORICAL  06/23/2016   IR ANGIO  VERTEBRAL SEL VERTEBRAL BILAT MOD SED 06/23/2016 Julieanne Cotton, MD [facility]  . IR GENERIC HISTORICAL  06/29/2016   IR RADIOLOGIST EVAL & MGMT 06/29/2016 [facility]  . SHOULDER ARTHROSCOPY WITH OPEN ROTATOR CUFF REPAIR AND DISTAL CLAVICLE ACROMINECTOMY Right 02/04/2015   Procedure: SHOULDER ARTHROSCOPY WITH SUBACROMIAL DECOMPRESSION AND OPEN ROTATOR CUFF REPAIR,;  Surgeon: Cammy Copa, MD;  Location: [facility] OR;  Service: Orthopedics;  Laterality: Right;  RIGHT SHOULDER DOA,SAD,MINI-OPEN ROTATOR CUFF TEAR REPAIR.    Allergies: Imitrex [sumatriptan]; Plavix [clopidogrel bisulfate]; and Lipitor [atorvastatin]  Medications: Prior to Admission medications   Medication Sig Start Date End Date Taking? Authorizing Provider  aspirin EC 81 MG tablet Take 81 mg by mouth at bedtime.    Historical Provider, MD  chlorthalidone (HYGROTON) 25 MG tablet Take 25 mg by mouth at bedtime.  09/03/15   Historical Provider, MD  diphenhydramine-acetaminophen (TYLENOL PM) 25-500 MG TABS tablet Take 2 tablets by mouth at bedtime as needed (for pain or sleep).     Historical Provider, MD  Empagliflozin-Metformin HCl (SYNJARDY) 12.02-999 MG TABS Take 1 tablet by mouth 2 (two) times daily.     Historical Provider, MD  fluconazole (DIFLUCAN) 150 MG tablet Take 150 mg by mouth daily as needed (for yeast infections).    Historical Provider, MD  gabapentin (NEURONTIN) 250 MG/5ML solution Take 3 mLs (150 mg total) by mouth 3 (three)  times daily. Patient taking differently: Take 150 mg by mouth at bedtime.  06/07/16   Marvel Plan, MD  lamoTRIgine (LAMICTAL) 25 MG tablet 25mg  Qhs x 2 wks, then 25mg  bid x 2 wks, then 25mg  am/50mg  pm x 2 wks, then 50mg  bid x 2 wks, then 50mg  am/100mg  pm x 2 wks, then 100mg  bid Patient taking differently: Take 100 mg by mouth 2 (two) times daily.  05/15/16   Marvel Plan, MD  Nebivolol HCl (BYSTOLIC) 20 MG TABS Take 20 mg by mouth every evening.     Historical Provider, MD  oxyCODONE-acetaminophen  (PERCOCET) 10-325 MG tablet Take 1 tablet by mouth every 4 (four) hours as needed for pain.    Historical Provider, MD  pravastatin (PRAVACHOL) 40 MG tablet Take 40 mg by mouth at bedtime.    Historical Provider, MD  promethazine (PHENERGAN) 25 MG tablet Take 25 mg by mouth 3 (three) times daily as needed for nausea or vomiting.     Historical Provider, MD  SOLIQUA 100-33 UNT-MCG/ML SOPN INJECT 32 UNITS UNDER THE SKIN DAILY in the evening AS NEEDED FOR BLOOD SUGAR 02/25/16   Historical Provider, MD  ticagrelor (BRILINTA) 90 MG TABS tablet Take 45-90 mg by mouth See admin instructions. Take 90 mg by mouth in the morning and take 45 mg by mouth at night    Historical Provider, MD  tiZANidine (ZANAFLEX) 4 MG tablet Take 4 mg by mouth every 8 (eight) hours as needed (migraines).     Historical Provider, MD     Family History  Problem Relation Age of Onset  . Hypertension Mother   . Hypertension Father   . Cancer Maternal Aunt   . Cancer Maternal Uncle     Social History   Social History  . Marital status: Single    Spouse name: [name]  . Number of children: 4  . Years of education: College    Occupational History  . 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 Smoking status: Never Smoker  . Smokeless tobacco: Never Used  . Alcohol use 0.0 oz/week     Comment: occasional  . Drug use: No  . Sexual activity: No   Other Topics Concern  . None   Social History Narrative   Patient is single with 4 children.   Patient is right handed.   Patient has college education.   Patient drinks 1 cup daily.     Review of Systems: A 12 point ROS discussed and pertinent positives are indicated in the HPI above.  All other systems are negative.  Review of Systems  Constitutional: Positive for fatigue. Negative for activity change, appetite change and fever.  HENT: Negative for tinnitus and trouble swallowing.   Eyes: Negative for visual disturbance.  Respiratory: Negative for cough and  shortness of breath.   Cardiovascular: Negative for chest pain.  Gastrointestinal: Negative for abdominal pain, nausea and vomiting.  Musculoskeletal: Negative for back pain and gait problem.  Neurological: Positive for weakness and headaches. Negative for dizziness, tremors, seizures, syncope, facial asymmetry, speech difficulty, light-headedness and numbness.  Psychiatric/Behavioral: Negative for behavioral problems and confusion.    Vital Signs: There were no vitals taken for this visit.  Physical Exam  Constitutional: She is oriented to person, place, and time. She appears well-nourished.  HENT:  Head: Atraumatic.  Eyes: EOM are normal.  Neck: Neck supple.  Cardiovascular: Normal rate and regular rhythm.   Pulmonary/Chest: Effort normal and breath sounds normal. She has  no wheezes.  Abdominal: Soft. Bowel sounds are normal. There is no tenderness.  Musculoskeletal: Normal range of motion. She exhibits no edema.  Neurological: She is alert and oriented to person, place, and time.  Skin: Skin is warm and dry.  Psychiatric: She has a normal mood and affect. Her behavior is normal. Judgment and thought content normal.  Nursing note and vitals reviewed.   Mallampati Score:  MD Evaluation Airway: WNL Heart: WNL Abdomen: WNL Chest/ Lungs: WNL ASA  Classification: 3 Mallampati/Airway Score: One  Imaging: Mr Brain Wo Contrast  Result Date: 06/18/2016 CLINICAL DATA:  Weakness in arms.  History CVA.  Speech changes. EXAM: MRI HEAD WITHOUT CONTRAST TECHNIQUE: Multiplanar, multiecho pulse sequences of the brain and surrounding structures were obtained without intravenous contrast. COMPARISON:  MRI 01/05/2016 FINDINGS: Ventricle size normal.  Cerebral volume normal. Multiple areas of chronic infarction involving the cerebral white matter, left greater than right. Chronic lacunar infarction in the centrum semiovale on the left is unchanged. Periventricular white matter chronic ischemia  also noted. Chronic infarct in the anterior corpus callosum unchanged. Chronic infarct in the internal capsule anteriorly on the left. Small chronic infarcts in the thalamus. Small cortical infarcts in the right parietal convexity. Diffusion-weighted imaging demonstrates several punctate areas of hyperintensity in the left frontal lobe involving cortex. These are not definitely seen previously and could represent acute or subacute areas of infarction in the same territory as previously noted white matter infarcts. No other areas of restricted diffusion. Negative for intracranial hemorrhage. Negative for mass or edema.  No shift of the midline structures. Paranasal sinuses clear. Normal orbital structures. Pituitary not enlarged. IMPRESSION: Punctate areas of diffusion hyperintensity in the left frontal lobe. There are approximately 4 of these lesions which may represent acute or subacute infarction. Multiple areas of chronic ischemia in the cerebral white matter, left greater than right. Note is made of a critical stenosis in the left MCA on previous MRA 02/21/2015. There is also significant disease in the anterior cerebral arteries bilaterally. Follow-up MRI or CTA suggested to determine any progression of intracranial atherosclerotic disease. Electronically Signed   By: Marlan Palau M.D.   On: 06/18/2016 08:56   Ir Radiologist Eval & Mgmt  Result Date: 07/03/2016 EXAM: NEW PATIENT OFFICE VISIT CHIEF COMPLAINT: History of worsening expressive speech difficulties, and right hand and arm weakness since October. Increasing frequency and duration. History of severe left MCA stenosis. Current Pain Level: 1-10 HISTORY OF PRESENT ILLNESS: The patient is a 50 year old right-handed lady who presents for evaluation of symptomatic severe left MCA stenosis. The patient has been referred by her neurologist. Briefly the patient has been having intermittent speech difficulties mostly involving expression and difficulty  bringing up words while at work associated with right arm numbness and weakness which lasts almost a minute. These have increased in frequency having had almost three of these today. These occur on a daily basis with increased frequency and duration. The patient also has a right-sided headache which she has had for many years and has been classified as a migraine. She has had multiple treatments with mixed results in regards to alleviation of headaches. The patient is known to have had left middle cerebral artery distribution watershed ischemic strokes since July of 2015. The MRA of the brain done at that time revealed severe pre occlusive stenosis with a stenosis also involving the right middle cerebral artery M1 segment. The patient was then started on aggressive medical management which entailed aspirin and Plavix. However, she  reports she developed a generalized rash which is presumed to be due to Plavix and this was then stopped. The patient was started on Persantine 75 mg 3 times a day which she has been on since then. Despite these the patient has had multiple episodes of TIA-like symptoms as described. Her most recent MRI scan of the brain performed on 06/18/2016 reveals micro embolic diffusion-weighted changes involving the left MCA distribution. She underwent a catheter angiogram on 06/23/2016. This confirmed the presence of a severe pre occlusive high-grade stenosis of the left middle cerebral artery proximal M1 segment, and also about a 50% stenosis of the right middle cerebral artery M1 segment. Additionally seen were focal areas of caliber irregularity. At the present time the patient reports no symptoms of amaurosis fugax in the left eye, blindness, bilateral visual blurring, loss of consciousness, altered mental status, paresthesias, gait abnormalities. She denies any nausea, vomiting or swallowing difficulties. Past Medical History: Significant for headaches, high blood pressure, high cholesterol and  diabetes. History of a previous stroke as mentioned above. Previous Surgical History: Breast reduction in 2000, closure of a PFO in 2010. Medications: Aspirin, Hygroton, persantine, metformin, fluconazole, Neurontin, lamotrigine, Bystolic, Pravachol, Phenergan, Soliqua, tizanidine. She also takes oxycodone 10/325 for headaches 3 times a day as needed. Allergies: Plavix causes a diffuse rash.  Lipitor and Imitrex. Social History: Patient is single, has two boys and two girls, alive and well. She works at a [employer]. She drinks alcohol occasionally. Denies smoking cigarettes or using illicit chemicals. Family History: Asthma, breast carcinoma, diabetes in her mother, headaches, renal disease, and migraine headaches. REVIEW OF SYSTEMS: Denies any chest pain, shortness of breath, paraoxysmal nocturnal dyspnea or peripheral edema. Denies any coughing, wheezing or hemoptysis. Denies any abdominal pain, difficulty swallowing, diarrhea or constipation. Denies any melanotic stools. Her weight is steady.  Appetite is normal. The patient denies any recent chills, fever or rigors. PHYSICAL EXAMINATION: In no acute distress.  Affect appears moderately depressed. Neurologically intact at time of visit. ASSESSMENT AND PLAN: The patient's recent MRI and catheter angiogram were reviewed in detail. These reveal the high-grade severe stenosis of the left middle cerebral artery proximally. It is felt given the patient's history and findings her symptoms are most likely related to the severe high-grade stenosis. The option of addition of Brilinta with medical surveillance versus consideration of endovascular revascularization of the left middle cerebral artery with balloon angioplasty and possibly stenting was also reviewed in detail. The procedure, the risks, benefits were all reviewed in detail. Risk of thromboembolic stroke or rupture of vessel or reperfusion hemorrhage with worsening neurological deficit and death were all  reviewed in detail. Questions are answered to her satisfaction. They would like to proceed with endovascular revascularization of the symptomatic left middle cerebral artery severe stenosis. This has been scheduled for this Monday. The patient will be started on Brilinta 90 mg b.i.d. She will undergo a platelet inhibition test [name] on Monday morning. Should this be within the therapy range, the patient has been scheduled to undergo endovascular revascularization with balloon angioplasty with possible stenting. She was asked to call should she have any concerns or questions. Electronically Signed   By: Julieanne Cotton M.D.   On: 07/01/2016 10:54   Ir Angio Intra Extracran Sel Com Carotid Innominate Bilat Mod Sed  Result Date: 06/28/2016 CLINICAL DATA:  Crescendo TIAs involving the left middle cerebral artery distribution. Transient symptoms comprising expressive aphasia, and right-sided weakness with gradual improvement. High-grade stenosis of the left MCA  on recent MRA examination of the brain. EXAM: BILATERAL COMMON CAROTID AND INNOMINATE ANGIOGRAPHY AND BILATERAL VERTEBRAL ARTERY ANGIOGRAMS PROCEDURE: Contrast: Isovue 300 approximately a 65 mL. Anesthesia/Sedation:  Conscious sedation. Medications: Versed 1 mg IV. Fentanyl 75 mcg IV. Heparin 1000 units IV. Hydralazine 5 mg IV. Following a full explanation of the procedure along with the potential associated complications, an informed witnessed consent was obtained. The right groin was prepped and draped in the usual sterile fashion. Thereafter using modified Seldinger technique, transfemoral access into the right common femoral artery was obtained without difficulty. Over a 0.035 inch guidewire, a 5 French Pinnacle sheath was inserted. Through this, and also over 0.035 inch guidewire, a 5 [place] JB 1 catheter was advanced to the aortic arch region and selectively positioned in the right common carotid artery, the right vertebral artery, the left common  carotid artery and the left vertebral artery. There were no acute complications. The patient tolerated the procedure well. FINDINGS: The right common carotid arteriogram demonstrates the right external carotid artery and its major branches to be normal. The right internal carotid artery at the bulb to the cranial skull base opacifies normally. The petrous, cavernous and the supraclinoid segments are widely patent. A right posterior communicating artery is seen opacifying the right posterior cerebral artery distribution. The right middle cerebral artery has a moderate 50% stenoses with good antegrade flow into the right middle cerebral distribution. A hypoplastic right anterior cerebral artery is seen in its A1 segment with delayed images demonstrating antegrade flow into the right anterior cerebral artery distribution in the A2 segment. The right vertebral artery origin is normal. The vessel is seen to opacify normally to the cranial skull base. A hypoplastic right posterior-inferior cerebellar artery is seen. Wide patency of the right vertebrobasilar junction, the basilar artery, the posterior cerebral artery, superior cerebellar arteries and the anterior-inferior cerebellar artery is seen into the capillary and venous phases. Non-opacified blood is seen in the basilar artery from the contralateral vertebral artery. The mid basilar segment demonstrates mild caliber irregularity consistent with arteriosclerosis intracranially. The left-sided common carotid arteriogram demonstrates the left external carotid artery and its major branches to be widely patent. The left internal carotid artery at the bulb to the cranial skull base opacifies normally. The petrous and the cavernous segments are widely patent. There is a moderate stenosis of the left internal carotid artery supraclinoid segment. The left middle cerebral artery has a severe pre occlusive stenosis with a 95% plus stenosis. Distal to this the left MCA  bifurcation branches opacify into the capillary and venous phases with delayed exit of contrast in the late arterial and capillary phases consistent with the more proximal severe stenosis. The left anterior cerebral artery A1 segment is hypoplastic. Flow is noted into the distal left A2 segment. A left posterior communicating artery is seen with a focal area of narrowing of the proximal left P1 segment. The left vertebral artery origin is normal. The vessel is seen to opacify normally to the cranial skull base. Wide patency is seen of the left posterior-inferior cerebellar artery and the left vertebrobasilar junction. Flow is noted into the basilar artery mixing with non-opacified blood from the more dominant right vertebral artery. Again patency of the right posterior cerebral artery, the anterior inferior cerebellar arteries and the posterior cerebral arteries is noted into the capillary and venous phases. Significant amount of non-opacified blood is seen in the basilar artery from the more dominant right vertebral artery as mentioned earlier. IMPRESSION: Severe pre occlusive  95% stenoses of the left middle cerebral artery M1 segment with decreased hemodynamic ascent of contrast in the MCA distribution. This is responsible for the patient's symptoms as described. Approximately 50% stenosis of the right middle cerebral artery M1 segment. Scattered focal areas of caliber irregularity and narrowing keeping with the diffuse intracranial arteriosclerosis, given the patient's risk factors of diabetes of longstanding duration, hypertension, and hyperlipidemia. Additionally the patient's distal pulses in the left foot are diminished and only Dopplerable. One of her distal pulses in the in the right foot is also Dopplerable. The findings were reviewed with the patient. Electronically Signed   By: Julieanne Cotton M.D.   On: 06/27/2016 19:16   Ir Angio Vertebral Sel Vertebral Bilat Mod Sed  Result Date:  06/28/2016 CLINICAL DATA:  Crescendo TIAs involving the left middle cerebral artery distribution. Transient symptoms comprising expressive aphasia, and right-sided weakness with gradual improvement. High-grade stenosis of the left MCA on recent MRA examination of the brain. EXAM: BILATERAL COMMON CAROTID AND INNOMINATE ANGIOGRAPHY AND BILATERAL VERTEBRAL ARTERY ANGIOGRAMS PROCEDURE: Contrast: Isovue 300 approximately a 65 mL. Anesthesia/Sedation:  Conscious sedation. Medications: Versed 1 mg IV. Fentanyl 75 mcg IV. Heparin 1000 units IV. Hydralazine 5 mg IV. Following a full explanation of the procedure along with the potential associated complications, an informed witnessed consent was obtained. The right groin was prepped and draped in the usual sterile fashion. Thereafter using modified Seldinger technique, transfemoral access into the right common femoral artery was obtained without difficulty. Over a 0.035 inch guidewire, a 5 French Pinnacle sheath was inserted. Through this, and also over 0.035 inch guidewire, a 5 [place] JB 1 catheter was advanced to the aortic arch region and selectively positioned in the right common carotid artery, the right vertebral artery, the left common carotid artery and the left vertebral artery. There were no acute complications. The patient tolerated the procedure well. FINDINGS: The right common carotid arteriogram demonstrates the right external carotid artery and its major branches to be normal. The right internal carotid artery at the bulb to the cranial skull base opacifies normally. The petrous, cavernous and the supraclinoid segments are widely patent. A right posterior communicating artery is seen opacifying the right posterior cerebral artery distribution. The right middle cerebral artery has a moderate 50% stenoses with good antegrade flow into the right middle cerebral distribution. A hypoplastic right anterior cerebral artery is seen in its A1 segment with delayed images  demonstrating antegrade flow into the right anterior cerebral artery distribution in the A2 segment. The right vertebral artery origin is normal. The vessel is seen to opacify normally to the cranial skull base. A hypoplastic right posterior-inferior cerebellar artery is seen. Wide patency of the right vertebrobasilar junction, the basilar artery, the posterior cerebral artery, superior cerebellar arteries and the anterior-inferior cerebellar artery is seen into the capillary and venous phases. Non-opacified blood is seen in the basilar artery from the contralateral vertebral artery. The mid basilar segment demonstrates mild caliber irregularity consistent with arteriosclerosis intracranially. The left-sided common carotid arteriogram demonstrates the left external carotid artery and its major branches to be widely patent. The left internal carotid artery at the bulb to the cranial skull base opacifies normally. The petrous and the cavernous segments are widely patent. There is a moderate stenosis of the left internal carotid artery supraclinoid segment. The left middle cerebral artery has a severe pre occlusive stenosis with a 95% plus stenosis. Distal to this the left MCA bifurcation branches opacify into the capillary and venous phases  with delayed exit of contrast in the late arterial and capillary phases consistent with the more proximal severe stenosis. The left anterior cerebral artery A1 segment is hypoplastic. Flow is noted into the distal left A2 segment. A left posterior communicating artery is seen with a focal area of narrowing of the proximal left P1 segment. The left vertebral artery origin is normal. The vessel is seen to opacify normally to the cranial skull base. Wide patency is seen of the left posterior-inferior cerebellar artery and the left vertebrobasilar junction. Flow is noted into the basilar artery mixing with non-opacified blood from the more dominant right vertebral artery. Again patency  of the right posterior cerebral artery, the anterior inferior cerebellar arteries and the posterior cerebral arteries is noted into the capillary and venous phases. Significant amount of non-opacified blood is seen in the basilar artery from the more dominant right vertebral artery as mentioned earlier. IMPRESSION: Severe pre occlusive 95% stenoses of the left middle cerebral artery M1 segment with decreased hemodynamic ascent of contrast in the MCA distribution. This is responsible for the patient's symptoms as described. Approximately 50% stenosis of the right middle cerebral artery M1 segment. Scattered focal areas of caliber irregularity and narrowing keeping with the diffuse intracranial arteriosclerosis, given the patient's risk factors of diabetes of longstanding duration, hypertension, and hyperlipidemia. Additionally the patient's distal pulses in the left foot are diminished and only Dopplerable. One of her distal pulses in the in the right foot is also Dopplerable. The findings were reviewed with the patient. Electronically Signed   By: Julieanne CottonSanjeev  Deveshwar M.D.   On: 06/27/2016 19:16    Labs:  CBC:  Recent Labs  07/03/16 0634 07/13/16 0723  WBC 7.4 7.1  HGB 13.1 12.7  HCT 40.9 40.5  PLT 233 233    COAGS:  Recent Labs  06/23/16 1110 07/03/16 0634 07/13/16 0723  INR 1.02 0.93 1.00  APTT  --  27 29    BMP:  Recent Labs  07/03/16 0634 07/13/16 0723  NA 141 141  K 3.9 4.0  CL 108 106  CO2 26 29  GLUCOSE 134* 110*  BUN 19 12  CALCIUM 9.7 9.6  CREATININE 1.27* 1.17*  GFRNONAA 49* 54*  GFRAA 56* >60    LIVER FUNCTION TESTS:  Recent Labs  07/13/16 0723  BILITOT 0.5  AST 20  ALT 17  ALKPHOS 115  PROT 7.2  ALBUMIN 3.8    TUMOR MARKERS: No results for input(s): AFPTM, CEA, CA199, CHROMGRNA in the last 8760 hours.  Assessment and Plan:  Hx CVA Cerebral arteriogram 9/1 reveals L MCA stenosis Scheduled now for angioplasty/stent placement Risks and  Benefits discussed with the patient including, but not limited to bleeding, infection, vascular injury, contrast induced renal failure, stroke or even death. All of the patient's questions were answered, patient is agreeable to proceed. Consent signed and in chart.  Awaiting [name] result today  Pt aware if intervention performed, she will be admitted to Neuro ICU overnight and plan for discharge in am Agreeable to proceed.  Thank you for this interesting consult.  I greatly enjoyed meeting Patricia [name] and look forward to participating in their care.  A copy of this report was sent to the requesting provider on this date.  Electronically Signed: Ralene MuskratURPIN,Milen Lengacher A 07/13/2016, 8:34 AM   I spent a total of  30 Minutes   in face to face in clinical consultation, greater than 50% of which was counseling/coordinating care for cerebral arteriogram with possible pta/stent  L MCA

## 2016-07-14 ENCOUNTER — Ambulatory Visit (HOSPITAL_COMMUNITY): Admission: RE | Admit: 2016-07-14 | Payer: BLUE CROSS/BLUE SHIELD | Source: Ambulatory Visit

## 2016-07-14 ENCOUNTER — Ambulatory Visit (HOSPITAL_COMMUNITY)
Admission: RE | Admit: 2016-07-14 | Payer: BLUE CROSS/BLUE SHIELD | Source: Ambulatory Visit | Admitting: Interventional Radiology

## 2016-07-14 ENCOUNTER — Encounter (HOSPITAL_COMMUNITY): Admission: RE | Payer: Self-pay | Source: Ambulatory Visit

## 2016-07-14 LAB — HEMOGLOBIN A1C
Hgb A1c MFr Bld: 7.4 % — ABNORMAL HIGH (ref 4.8–5.6)
Mean Plasma Glucose: 166 mg/dL

## 2016-07-14 SURGERY — RADIOLOGY WITH ANESTHESIA
Anesthesia: General

## 2016-07-17 ENCOUNTER — Other Ambulatory Visit: Payer: Self-pay | Admitting: Radiology

## 2016-07-17 LAB — PLATELET INHIBITION P2Y12: Platelet Function  P2Y12: 100 [PRU] — ABNORMAL LOW (ref 194–418)

## 2016-07-20 ENCOUNTER — Ambulatory Visit: Payer: BLUE CROSS/BLUE SHIELD

## 2016-07-20 ENCOUNTER — Other Ambulatory Visit: Payer: Self-pay | Admitting: General Surgery

## 2016-07-21 ENCOUNTER — Ambulatory Visit
Admission: RE | Admit: 2016-07-21 | Discharge: 2016-07-21 | Disposition: A | Discharge status: Home / Self Care | Payer: BLUE CROSS/BLUE SHIELD | Source: Ambulatory Visit | Attending: Internal Medicine | Admitting: Internal Medicine

## 2016-07-21 ENCOUNTER — Other Ambulatory Visit: Payer: Self-pay | Admitting: Radiology

## 2016-07-21 ENCOUNTER — Encounter (HOSPITAL_COMMUNITY): Payer: Self-pay | Admitting: *Deleted

## 2016-07-21 DIAGNOSIS — Z1231 Encounter for screening mammogram for malignant neoplasm of breast: Secondary | ICD-10-CM

## 2016-07-21 LAB — PLATELET INHIBITION P2Y12: Platelet Function  P2Y12: 118 [PRU] — ABNORMAL LOW (ref 194–418)

## 2016-07-21 NOTE — Progress Notes (Signed)
Spoke with Patricia [name] for pre-op call. Patricia [name] has hx of PFO and it was surgically repaired as an adult. Dr. Jacinto HalimGanji is her cardiologist. She denies any recent chest pain or sob. Patricia [name] is diabetic. Last A1C was 7.3 on 06/13/16. Patricia [name] states fasting blood sugar was 111 this AM. Instructed Patricia [name] to take 1/2 of her regular dose of Soliqua Insulin Sunday evening (will be 16 units). Instructed her to check blood sugar in AM  If blood sugar is 70 or below, treat with 1/2 cup of clear juice (apple or cranberry) and recheck blood sugar 15 minutes after drinking juice. If blood sugar continues to be 70 or below, call the Short Stay department and ask to speak to a nurse.

## 2016-07-23 NOTE — Anesthesia Preprocedure Evaluation (Addendum)
Anesthesia Evaluation  Patient identified by MRN, date of birth, ID band Patient awake    Reviewed: Allergy & Precautions, NPO status , Patient's Chart, lab work & pertinent test results  Airway Mallampati: II  TM Distance: >3 FB Neck ROM: Full    Dental  (+) Teeth Intact, Dental Advisory Given   Pulmonary asthma ,    breath sounds clear to auscultation       Cardiovascular hypertension, + Peripheral Vascular Disease   Rhythm:Regular Rate:Normal     Neuro/Psych  Headaches,    GI/Hepatic GERD  ,  Endo/Other  diabetes  Renal/GU Renal disease     Musculoskeletal   Abdominal (+) + obese,   Peds  Hematology   Anesthesia Other Findings   Reproductive/Obstetrics                           Anesthesia Physical  Anesthesia Plan  ASA: III  Anesthesia Plan: General   Post-op Pain Management:    Induction: Intravenous  Airway Management Planned: Oral ETT  Additional Equipment: Arterial line  Intra-op Plan:   Post-operative Plan: Extubation in OR  Informed Consent: I have reviewed the patients History and Physical, chart, labs and discussed the procedure including the risks, benefits and alternatives for the proposed anesthesia with the patient or authorized representative who has indicated his/her understanding and acceptance.   Dental advisory given  Plan Discussed with: CRNA and Anesthesiologist  Anesthesia Plan Comments:        Anesthesia Quick Evaluation

## 2016-07-24 ENCOUNTER — Encounter (HOSPITAL_COMMUNITY): Payer: Self-pay | Admitting: *Deleted

## 2016-07-24 ENCOUNTER — Ambulatory Visit (HOSPITAL_COMMUNITY): Payer: BLUE CROSS/BLUE SHIELD | Admitting: Certified Registered Nurse Anesthetist

## 2016-07-24 ENCOUNTER — Encounter (HOSPITAL_COMMUNITY): Payer: Self-pay

## 2016-07-24 ENCOUNTER — Inpatient Hospital Stay (HOSPITAL_COMMUNITY)
Admission: AD | Admit: 2016-07-24 | Discharge: 2016-07-25 | DRG: 026 | Disposition: A | Discharge status: Home / Self Care | Payer: BLUE CROSS/BLUE SHIELD | Source: Ambulatory Visit | Attending: Interventional Radiology | Admitting: Interventional Radiology

## 2016-07-24 ENCOUNTER — Encounter (HOSPITAL_COMMUNITY)
Admission: AD | Disposition: A | Discharge status: Home / Self Care | Payer: Self-pay | Source: Ambulatory Visit | Attending: Interventional Radiology

## 2016-07-24 ENCOUNTER — Ambulatory Visit (HOSPITAL_COMMUNITY)
Admission: RE | Admit: 2016-07-24 | Discharge: 2016-07-24 | Disposition: A | Discharge status: Home / Self Care | Payer: BLUE CROSS/BLUE SHIELD | Source: Ambulatory Visit | Attending: Interventional Radiology | Admitting: Interventional Radiology

## 2016-07-24 ENCOUNTER — Encounter (HOSPITAL_COMMUNITY): Payer: Self-pay | Admitting: Certified Registered Nurse Anesthetist

## 2016-07-24 DIAGNOSIS — Z7984 Long term (current) use of oral hypoglycemic drugs: Secondary | ICD-10-CM | POA: Diagnosis not present

## 2016-07-24 DIAGNOSIS — I639 Cerebral infarction, unspecified: Secondary | ICD-10-CM

## 2016-07-24 DIAGNOSIS — I739 Peripheral vascular disease, unspecified: Secondary | ICD-10-CM | POA: Diagnosis present

## 2016-07-24 DIAGNOSIS — I771 Stricture of artery: Secondary | ICD-10-CM

## 2016-07-24 DIAGNOSIS — Z6836 Body mass index (BMI) 36.0-36.9, adult: Secondary | ICD-10-CM | POA: Diagnosis not present

## 2016-07-24 DIAGNOSIS — I10 Essential (primary) hypertension: Secondary | ICD-10-CM | POA: Diagnosis present

## 2016-07-24 DIAGNOSIS — E119 Type 2 diabetes mellitus without complications: Secondary | ICD-10-CM | POA: Diagnosis present

## 2016-07-24 DIAGNOSIS — K219 Gastro-esophageal reflux disease without esophagitis: Secondary | ICD-10-CM | POA: Diagnosis present

## 2016-07-24 DIAGNOSIS — I6602 Occlusion and stenosis of left middle cerebral artery: Secondary | ICD-10-CM | POA: Diagnosis present

## 2016-07-24 DIAGNOSIS — E669 Obesity, unspecified: Secondary | ICD-10-CM | POA: Diagnosis present

## 2016-07-24 DIAGNOSIS — Z7982 Long term (current) use of aspirin: Secondary | ICD-10-CM

## 2016-07-24 DIAGNOSIS — J45909 Unspecified asthma, uncomplicated: Secondary | ICD-10-CM | POA: Diagnosis present

## 2016-07-24 DIAGNOSIS — I69351 Hemiplegia and hemiparesis following cerebral infarction affecting right dominant side: Secondary | ICD-10-CM

## 2016-07-24 HISTORY — PX: RADIOLOGY WITH ANESTHESIA: SHX6223

## 2016-07-24 HISTORY — PX: IR GENERIC HISTORICAL: IMG1180011

## 2016-07-24 LAB — BASIC METABOLIC PANEL
ANION GAP: 7 (ref 5–15)
BUN: 17 mg/dL (ref 6–20)
CALCIUM: 9.6 mg/dL (ref 8.9–10.3)
CO2: 27 mmol/L (ref 22–32)
CREATININE: 1.11 mg/dL — AB (ref 0.44–1.00)
Chloride: 107 mmol/L (ref 101–111)
GFR calc Af Amer: >60 mL/min (ref >60)
GFR, EST NON AFRICAN AMERICAN: 57 mL/min — AB (ref >60)
Glucose, Bld: 125 mg/dL — ABNORMAL HIGH (ref 65–99)
POTASSIUM: 4.2 mmol/L (ref 3.5–5.1)
SODIUM: 141 mmol/L (ref 135–145)

## 2016-07-24 LAB — CBC WITH DIFFERENTIAL/PLATELET
BASOS ABS: 0 10*3/uL (ref 0.0–0.1)
BASOS PCT: 0 %
EOS PCT: 1 %
Eosinophils Absolute: 0.1 10*3/uL (ref 0.0–0.7)
HCT: 40.6 % (ref 36.0–46.0)
Hemoglobin: 12.9 g/dL (ref 12.0–15.0)
Lymphocytes Relative: 33 %
Lymphs Abs: 2.5 10*3/uL (ref 0.7–4.0)
MCH: 28 pg (ref 26.0–34.0)
MCHC: 31.8 g/dL (ref 30.0–36.0)
MCV: 88.3 fL (ref 78.0–100.0)
MONO ABS: 0.6 10*3/uL (ref 0.1–1.0)
MONOS PCT: 7 %
Neutro Abs: 4.4 10*3/uL (ref 1.7–7.7)
Neutrophils Relative %: 59 %
PLATELETS: 235 10*3/uL (ref 150–400)
RBC: 4.6 MIL/uL (ref 3.87–5.11)
RDW: 13.4 % (ref 11.5–15.5)
WBC: 7.6 10*3/uL (ref 4.0–10.5)

## 2016-07-24 LAB — POCT ACTIVATED CLOTTING TIME
ACTIVATED CLOTTING TIME: 164 s
ACTIVATED CLOTTING TIME: 175 s
ACTIVATED CLOTTING TIME: 202 s
Activated Clotting Time: 197 seconds

## 2016-07-24 LAB — GLUCOSE, CAPILLARY
GLUCOSE-CAPILLARY: 94 mg/dL (ref 65–99)
Glucose-Capillary: 108 mg/dL — ABNORMAL HIGH (ref 65–99)
Glucose-Capillary: 106 mg/dL — ABNORMAL HIGH (ref 65–99)
Glucose-Capillary: 91 mg/dL (ref 65–99)

## 2016-07-24 LAB — APTT: APTT: 27 s (ref 24–36)

## 2016-07-24 LAB — PLATELET INHIBITION P2Y12: Platelet Function  P2Y12: 20 [PRU] — ABNORMAL LOW (ref 194–418)

## 2016-07-24 LAB — PROTIME-INR
INR: 0.97
PROTHROMBIN TIME: 12.9 s (ref 11.4–15.2)

## 2016-07-24 LAB — MRSA PCR SCREENING: MRSA BY PCR: NEGATIVE

## 2016-07-24 SURGERY — RADIOLOGY WITH ANESTHESIA
Anesthesia: General

## 2016-07-24 MED ORDER — FENTANYL CITRATE (PF) 100 MCG/2ML IJ SOLN
INTRAMUSCULAR | Status: DC | PRN
  Administered 2016-07-24 (×2): 100 ug via INTRAVENOUS

## 2016-07-24 MED ORDER — SODIUM CHLORIDE 0.9 % IV SOLN
INTRAVENOUS | Status: DC | PRN
  Administered 2016-07-24 (×2): via INTRAVENOUS

## 2016-07-24 MED ORDER — LABETALOL HCL 5 MG/ML IV SOLN
INTRAVENOUS | Status: DC | PRN
  Administered 2016-07-24 (×3): 2.5 mg via INTRAVENOUS

## 2016-07-24 MED ORDER — OXYCODONE HCL 5 MG PO TABS
10.0000 mg | ORAL_TABLET | ORAL | Status: DC | PRN
  Administered 2016-07-24 – 2016-07-25 (×3): 10 mg via ORAL
  Filled 2016-07-24 (×3): qty 2

## 2016-07-24 MED ORDER — LIDOCAINE HCL 1 % IJ SOLN
INTRAMUSCULAR | Status: AC
  Filled 2016-07-24: qty 20

## 2016-07-24 MED ORDER — HEPARIN SODIUM (PORCINE) 1000 UNIT/ML IJ SOLN
INTRAMUSCULAR | Status: DC | PRN
  Administered 2016-07-24: 500 [IU] via INTRAVENOUS
  Administered 2016-07-24: 3000 [IU] via INTRAVENOUS
  Administered 2016-07-24 (×2): 500 [IU] via INTRAVENOUS

## 2016-07-24 MED ORDER — TICAGRELOR 90 MG PO TABS
45.0000 mg | ORAL_TABLET | Freq: Two times a day (BID) | ORAL | Status: DC
  Administered 2016-07-24 – 2016-07-25 (×2): 45 mg via ORAL
  Filled 2016-07-24 (×3): qty 1

## 2016-07-24 MED ORDER — ASPIRIN EC 325 MG PO TBEC
325.0000 mg | DELAYED_RELEASE_TABLET | ORAL | Status: AC
  Administered 2016-07-24: 325 mg via ORAL

## 2016-07-24 MED ORDER — NIMODIPINE 30 MG PO CAPS: 0.0000 mg | ORAL_CAPSULE | ORAL | Status: DC

## 2016-07-24 MED ORDER — NITROGLYCERIN 1 MG/10 ML FOR IR/CATH LAB
INTRA_ARTERIAL | Status: AC
  Filled 2016-07-24: qty 10

## 2016-07-24 MED ORDER — ASPIRIN EC 325 MG PO TBEC
DELAYED_RELEASE_TABLET | ORAL | Status: AC
  Administered 2016-07-24: 325 mg via ORAL
  Filled 2016-07-24: qty 1

## 2016-07-24 MED ORDER — PROPOFOL 10 MG/ML IV BOLUS
INTRAVENOUS | Status: DC | PRN
  Administered 2016-07-24: 150 mg via INTRAVENOUS
  Administered 2016-07-24: 50 mg via INTRAVENOUS

## 2016-07-24 MED ORDER — ROCURONIUM BROMIDE 10 MG/ML (PF) SYRINGE
PREFILLED_SYRINGE | INTRAVENOUS | Status: DC | PRN
  Administered 2016-07-24: 60 mg via INTRAVENOUS
  Administered 2016-07-24: 20 mg via INTRAVENOUS

## 2016-07-24 MED ORDER — FLUCONAZOLE 150 MG PO TABS: 150.0000 mg | ORAL_TABLET | Freq: Every day | ORAL | Status: DC | PRN

## 2016-07-24 MED ORDER — EPTIFIBATIDE 20 MG/10ML IV SOLN
INTRAVENOUS | Status: AC
  Filled 2016-07-24: qty 10

## 2016-07-24 MED ORDER — IOPAMIDOL (ISOVUE-300) INJECTION 61%
INTRAVENOUS | Status: AC
  Administered 2016-07-24: 150 mL
  Filled 2016-07-24: qty 150

## 2016-07-24 MED ORDER — ACETAMINOPHEN 500 MG PO TABS: 1000.0000 mg | ORAL_TABLET | Freq: Four times a day (QID) | ORAL | Status: DC | PRN

## 2016-07-24 MED ORDER — HYDRALAZINE HCL 20 MG/ML IJ SOLN
INTRAMUSCULAR | Status: DC | PRN
  Administered 2016-07-24 (×4): 2 mg via INTRAVENOUS

## 2016-07-24 MED ORDER — SODIUM CHLORIDE 0.9 % IV SOLN: INTRAVENOUS | Status: DC

## 2016-07-24 MED ORDER — INSULIN ASPART 100 UNIT/ML ~~LOC~~ SOLN: 0.0000 [IU] | Freq: Three times a day (TID) | SUBCUTANEOUS | Status: DC

## 2016-07-24 MED ORDER — NEBIVOLOL HCL 10 MG PO TABS
20.0000 mg | ORAL_TABLET | Freq: Every day | ORAL | Status: DC
  Administered 2016-07-24: 20 mg via ORAL
  Filled 2016-07-24 (×3): qty 2

## 2016-07-24 MED ORDER — ROCURONIUM BROMIDE 100 MG/10ML IV SOLN: INTRAVENOUS | Status: DC | PRN

## 2016-07-24 MED ORDER — PHENYLEPHRINE HCL 10 MG/ML IJ SOLN
INTRAVENOUS | Status: DC | PRN
  Administered 2016-07-24: 10 ug/min via INTRAVENOUS

## 2016-07-24 MED ORDER — IOPAMIDOL (ISOVUE-300) INJECTION 61%
INTRAVENOUS | Status: AC
  Filled 2016-07-24: qty 150

## 2016-07-24 MED ORDER — NICARDIPINE HCL IN NACL 20-0.86 MG/200ML-% IV SOLN: 5.0000 mg/h | INTRAVENOUS | Status: DC

## 2016-07-24 MED ORDER — SODIUM CHLORIDE 0.9 % IV SOLN
INTRAVENOUS | Status: DC | PRN
  Administered 2016-07-24: 07:00:00 via INTRAVENOUS

## 2016-07-24 MED ORDER — SUGAMMADEX SODIUM 200 MG/2ML IV SOLN
INTRAVENOUS | Status: DC | PRN
  Administered 2016-07-24: 200 mg via INTRAVENOUS

## 2016-07-24 MED ORDER — CHLORTHALIDONE 25 MG PO TABS
25.0000 mg | ORAL_TABLET | Freq: Every day | ORAL | Status: DC
  Administered 2016-07-24: 25 mg via ORAL
  Filled 2016-07-24 (×2): qty 1

## 2016-07-24 MED ORDER — ASPIRIN 325 MG PO TABS
325.0000 mg | ORAL_TABLET | Freq: Every day | ORAL | Status: DC
  Administered 2016-07-25: 325 mg via ORAL
  Filled 2016-07-24: qty 1

## 2016-07-24 MED ORDER — ONDANSETRON HCL 4 MG/2ML IJ SOLN
INTRAMUSCULAR | Status: DC | PRN
  Administered 2016-07-24: 4 mg via INTRAVENOUS

## 2016-07-24 MED ORDER — MIDAZOLAM HCL 5 MG/5ML IJ SOLN
INTRAMUSCULAR | Status: DC | PRN
  Administered 2016-07-24: 2 mg via INTRAVENOUS

## 2016-07-24 MED ORDER — ONDANSETRON HCL 4 MG/2ML IJ SOLN: 4.0000 mg | Freq: Four times a day (QID) | INTRAMUSCULAR | Status: DC | PRN

## 2016-07-24 MED ORDER — LIDOCAINE 2% (20 MG/ML) 5 ML SYRINGE
INTRAMUSCULAR | Status: DC | PRN
  Administered 2016-07-24: 100 mg via INTRAVENOUS

## 2016-07-24 MED ORDER — TICAGRELOR 90 MG PO TABS
45.0000 mg | ORAL_TABLET | Freq: Two times a day (BID) | ORAL | Status: DC
  Filled 2016-07-24: qty 1

## 2016-07-24 MED ORDER — EPTIFIBATIDE 20 MG/10ML IV SOLN
INTRAVENOUS | Status: AC | PRN
  Administered 2016-07-24: 1.8 mg via INTRAVENOUS
  Administered 2016-07-24: 1 mg via INTRAVENOUS
  Administered 2016-07-24 (×3): 1.8 mg via INTRAVENOUS

## 2016-07-24 MED ORDER — SODIUM CHLORIDE 0.9 % IV SOLN
1500.0000 mg | INTRAVENOUS | Status: AC
  Administered 2016-07-24: 1500 mg via INTRAVENOUS
  Filled 2016-07-24 (×2): qty 2000

## 2016-07-24 MED ORDER — GABAPENTIN 250 MG/5ML PO SOLN
150.0000 mg | Freq: Every day | ORAL | Status: DC
  Administered 2016-07-24: 150 mg via ORAL
  Filled 2016-07-24 (×3): qty 3

## 2016-07-24 MED ORDER — NIMODIPINE 30 MG PO CAPS
ORAL_CAPSULE | ORAL | Status: AC
  Administered 2016-07-24: 30 mg via ORAL
  Filled 2016-07-24: qty 1

## 2016-07-24 MED ORDER — LIDOCAINE HCL (CARDIAC) 20 MG/ML IV SOLN: INTRAVENOUS | Status: DC | PRN

## 2016-07-24 MED ORDER — PRAVASTATIN SODIUM 40 MG PO TABS
40.0000 mg | ORAL_TABLET | Freq: Every day | ORAL | Status: DC
  Administered 2016-07-24: 40 mg via ORAL
  Filled 2016-07-24: qty 1

## 2016-07-24 MED ORDER — ACETAMINOPHEN 650 MG RE SUPP: 650.0000 mg | Freq: Four times a day (QID) | RECTAL | Status: DC | PRN

## 2016-07-24 MED ORDER — TIZANIDINE HCL 4 MG PO TABS
4.0000 mg | ORAL_TABLET | Freq: Three times a day (TID) | ORAL | Status: DC | PRN
  Administered 2016-07-24 – 2016-07-25 (×2): 4 mg via ORAL
  Filled 2016-07-24 (×2): qty 1

## 2016-07-24 MED ORDER — LAMOTRIGINE 100 MG PO TABS
100.0000 mg | ORAL_TABLET | Freq: Two times a day (BID) | ORAL | Status: DC
  Administered 2016-07-24 – 2016-07-25 (×2): 100 mg via ORAL
  Filled 2016-07-24 (×2): qty 1

## 2016-07-24 MED ORDER — HEPARIN (PORCINE) IN NACL 100-0.45 UNIT/ML-% IJ SOLN
INTRAMUSCULAR | Status: AC
  Administered 2016-07-24: 500 [IU]/h via INTRAVENOUS
  Filled 2016-07-24: qty 250

## 2016-07-24 MED ORDER — SODIUM CHLORIDE 0.9 % IV SOLN
INTRAVENOUS | Status: DC
  Administered 2016-07-24: 1000 mL via INTRAVENOUS

## 2016-07-24 MED ORDER — HEPARIN (PORCINE) IN NACL 100-0.45 UNIT/ML-% IJ SOLN
800.0000 [IU]/h | INTRAMUSCULAR | Status: DC
  Filled 2016-07-24: qty 250

## 2016-07-24 MED ORDER — HEPARIN (PORCINE) IN NACL 100-0.45 UNIT/ML-% IJ SOLN
700.0000 [IU]/h | INTRAMUSCULAR | Status: DC
  Filled 2016-07-24: qty 250

## 2016-07-24 MED ORDER — NIMODIPINE 30 MG PO CAPS
30.0000 mg | ORAL_CAPSULE | ORAL | Status: AC
  Administered 2016-07-24: 30 mg via ORAL

## 2016-07-24 MED ORDER — HEPARIN (PORCINE) IN NACL 100-0.45 UNIT/ML-% IJ SOLN
500.0000 [IU]/h | INTRAMUSCULAR | Status: DC
  Administered 2016-07-24: 500 [IU]/h via INTRAVENOUS
  Filled 2016-07-24: qty 250

## 2016-07-24 NOTE — Procedures (Signed)
Lt CCA arteriogram,follwed by Stent assisted angioplasty of symptomatic LT MCA stenosis  With  residual 30 % stenosis

## 2016-07-24 NOTE — Progress Notes (Signed)
Pt started oozing blood at R groin site @ 1620.  Held manual pressure for 20 min.  V pad placed at site.  Bleeding controlled at this time.  Pressure dressing applied.  10lb sandbag applied.  Femoral pulse and pedal pulse +2.  Will continue to monitor and keep pt flat.

## 2016-07-24 NOTE — Progress Notes (Signed)
Orthopedic Tech Progress Note Patient Details:  Patricia [name] Oct 07, 1966 [id number]  Patient ID: Patricia [name], female   DOB: Oct 07, 1966, 50 y.o.   MRN: [id number]   Patricia [name] 07/24/2016, 5:08 PMSand bag.

## 2016-07-24 NOTE — H&P (Signed)
Chief Complaint: Patient was seen in consultation today for left middle cerebral artery angioplasty/stent placement at the request of Dr Ralene Okoy Moreira  Referring Physician(s): Dr Ralene Okoy Moreira  Supervising Physician: Julieanne Cottoneveshwar, Sanjeev  Patient Status: Outpatient  History of Present Illness: Patricia [name] is a 50 y.o. female   Hx CVA Weakness Rt arm/hand Right sided posterior headache---remains even now  Cerebral arteriogram 06/23/2016: IMPRESSION: Severe pre occlusive 95% stenoses of the left middle cerebral artery M1 segment with decreased hemodynamic ascent of contrast in the MCA distribution. This is responsible for the patient's symptoms as described. Approximately 50% stenosis of the right middle cerebral artery M1 segment.  Was scheduled for L MCA pta/stent placement 9/21 [name] 9 that day Brilinta now 45 mg BID [name] 118 9/29 Pending stat today Also taking ASA 325 mg daily  Scheduled now for intervention with Dr Corliss Skainseveshwar and anesthesia  Past Medical History:  Diagnosis Date  . Anemia    in the past  . Asthma    " a long time ago"  . Bursitis   . Depression    13 years ago -   . Diabetes mellitus without complication (HCC)    type  2  . GERD (gastroesophageal reflux disease)    occ. acid reflux  . Headache    migraines  . Hypertension   . Kidney stones   . PFO (patent foramen ovale)    hx of PFO with surgery done as an adult  . Rotator cuff tear    right  . Stroke [facility](HCC) 2009   lasdt stroke July 2015  . Syncope and collapse     Past Surgical History:  Procedure Laterality Date  . ABDOMINAL HYSTERECTOMY    . BREAST REDUCTION SURGERY    . CARDIAC CATHETERIZATION    . CARDIAC SURGERY  Closed hole in heart in 2009   Amplatzer Septal Occluder Ref [id number] (not listed in op notes, document scanned under MRI with written op notes  . HYSTEROTOMY    . IR GENERIC HISTORICAL  06/23/2016   IR ANGIO INTRA EXTRACRAN SEL COM CAROTID INNOMINATE  BILAT MOD SED 06/23/2016 Julieanne CottonSanjeev Deveshwar, MD [facility]INTER[facility]  . IR GENERIC HISTORICAL  06/23/2016   IR ANGIO VERTEBRAL SEL VERTEBRAL BILAT MOD SED 06/23/2016 Julieanne CottonSanjeev Deveshwar, MD [facility]  . IR GENERIC HISTORICAL  06/29/2016   IR RADIOLOGIST EVAL & MGMT 06/29/2016 [facility]  . SHOULDER ARTHROSCOPY WITH OPEN ROTATOR CUFF REPAIR AND DISTAL CLAVICLE ACROMINECTOMY Right 02/04/2015   Procedure: SHOULDER ARTHROSCOPY WITH SUBACROMIAL DECOMPRESSION AND OPEN ROTATOR CUFF REPAIR,;  Surgeon: Cammy CopaScott Gregory Dean, MD;  Location: [facility] OR;  Service: Orthopedics;  Laterality: Right;  RIGHT SHOULDER DOA,SAD,MINI-OPEN ROTATOR CUFF TEAR REPAIR.    Allergies: Imitrex [sumatriptan]; Plavix [clopidogrel bisulfate]; and Lipitor [atorvastatin]  Medications: Prior to Admission medications   Medication Sig Start Date End Date Taking? Authorizing Provider  aspirin EC 81 MG tablet Take 81 mg by mouth at bedtime.    Historical Provider, MD  chlorthalidone (HYGROTON) 25 MG tablet Take 25 mg by mouth at bedtime.  09/03/15   Historical Provider, MD  diphenhydramine-acetaminophen (TYLENOL PM) 25-500 MG TABS tablet Take 2 tablets by mouth at bedtime as needed (for pain or sleep).     Historical Provider, MD  Empagliflozin-Metformin HCl (SYNJARDY) 12.02-999 MG TABS Take 1 tablet by mouth 2 (two) times daily.     Historical Provider, MD  fluconazole (DIFLUCAN) 150 MG tablet Take 150 mg by mouth daily as needed (for yeast infections).  Historical Provider, MD  gabapentin (NEURONTIN) 250 MG/5ML solution Take 3 mLs (150 mg total) by mouth 3 (three) times daily. Patient taking differently: Take 150 mg by mouth at bedtime.  06/07/16   Marvel Plan, MD  lamoTRIgine (LAMICTAL) 25 MG tablet 25mg  Qhs x 2 wks, then 25mg  bid x 2 wks, then 25mg  am/50mg  pm x 2 wks, then 50mg  bid x 2 wks, then 50mg  am/100mg  pm x 2 wks, then 100mg  bid Patient taking differently: Take 100 mg by mouth 2 (two) times daily.  05/15/16   Marvel Plan, MD  Nebivolol HCl  (BYSTOLIC) 20 MG TABS Take 20 mg by mouth every evening.     Historical Provider, MD  oxyCODONE-acetaminophen (PERCOCET) 10-325 MG tablet Take 1 tablet by mouth every 4 (four) hours as needed for pain.    Historical Provider, MD  pravastatin (PRAVACHOL) 40 MG tablet Take 40 mg by mouth at bedtime.    Historical Provider, MD  promethazine (PHENERGAN) 25 MG tablet Take 25 mg by mouth 3 (three) times daily as needed for nausea or vomiting.     Historical Provider, MD  SOLIQUA 100-33 UNT-MCG/ML SOPN INJECT 32 UNITS UNDER THE SKIN DAILY in the evening AS NEEDED FOR BLOOD SUGAR 02/25/16   Historical Provider, MD  ticagrelor (BRILINTA) 90 MG TABS tablet Take 45 mg by mouth 2 (two) times daily.     Historical Provider, MD  tiZANidine (ZANAFLEX) 4 MG tablet Take 4 mg by mouth every 8 (eight) hours as needed (migraines).     Historical Provider, MD     Family History  Problem Relation Age of Onset  . Hypertension Mother   . Hypertension Father   . Cancer Maternal Aunt   . Cancer Maternal Uncle     Social History   Social History  . Marital status: Single    Spouse name: [name]  . Number of children: 4  . Years of education: College    Occupational History  . 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 Smoking status: Never Smoker  . Smokeless tobacco: Never Used  . Alcohol use 0.0 oz/week     Comment: occasional  . Drug use: No  . Sexual activity: No   Other Topics Concern  . None   Social History Narrative   Patient is single with 4 children.   Patient is right handed.   Patient has college education.   Patient drinks 1 cup daily.     Review of Systems: A 12 point ROS discussed and pertinent positives are indicated in the HPI above.  All other systems are negative.  Review of Systems  Constitutional: Positive for activity change and fatigue. Negative for fever and unexpected weight change.  HENT: Negative for tinnitus, trouble swallowing and voice change.   Eyes: Negative  for visual disturbance.  Respiratory: Negative for shortness of breath.   Cardiovascular: Negative for chest pain.  Gastrointestinal: Negative for abdominal pain.  Musculoskeletal: Negative for back pain.  Neurological: Positive for speech difficulty, weakness and headaches. Negative for dizziness, tremors, seizures, syncope, facial asymmetry, light-headedness and numbness.  Psychiatric/Behavioral: Negative for behavioral problems and confusion.    Vital Signs: There were no vitals taken for this visit.  Physical Exam  Constitutional: She is oriented to person, place, and time. She appears well-nourished.  HENT:  Head: Atraumatic.  Eyes: EOM are normal.  Cardiovascular: Normal rate, regular rhythm and normal heart sounds.   Pulmonary/Chest: Effort normal and breath sounds normal. She has  no wheezes.  Abdominal: Soft. Bowel sounds are normal. There is no tenderness.  Musculoskeletal: Normal range of motion.  Neurological: She is alert and oriented to person, place, and time.  Skin: Skin is warm and dry.  Psychiatric: She has a normal mood and affect. Her behavior is normal. Judgment and thought content normal.  Nursing note and vitals reviewed.   Mallampati Score:  MD Evaluation Airway: WNL Heart: WNL Abdomen: WNL Chest/ Lungs: WNL ASA  Classification: 3 Mallampati/Airway Score: One  Imaging: Ir Radiologist Eval & Mgmt  Result Date: 07/03/2016 EXAM: NEW PATIENT OFFICE VISIT CHIEF COMPLAINT: History of worsening expressive speech difficulties, and right hand and arm weakness since October. Increasing frequency and duration. History of severe left MCA stenosis. Current Pain Level: 1-10 HISTORY OF PRESENT ILLNESS: The patient is a 50 year old right-handed lady who presents for evaluation of symptomatic severe left MCA stenosis. The patient has been referred by her neurologist. Briefly the patient has been having intermittent speech difficulties mostly involving expression and  difficulty bringing up words while at work associated with right arm numbness and weakness which lasts almost a minute. These have increased in frequency having had almost three of these today. These occur on a daily basis with increased frequency and duration. The patient also has a right-sided headache which she has had for many years and has been classified as a migraine. She has had multiple treatments with mixed results in regards to alleviation of headaches. The patient is known to have had left middle cerebral artery distribution watershed ischemic strokes since July of 2015. The MRA of the brain done at that time revealed severe pre occlusive stenosis with a stenosis also involving the right middle cerebral artery M1 segment. The patient was then started on aggressive medical management which entailed aspirin and Plavix. However, she reports she developed a generalized rash which is presumed to be due to Plavix and this was then stopped. The patient was started on Persantine 75 mg 3 times a day which she has been on since then. Despite these the patient has had multiple episodes of TIA-like symptoms as described. Her most recent MRI scan of the brain performed on 06/18/2016 reveals micro embolic diffusion-weighted changes involving the left MCA distribution. She underwent a catheter angiogram on 06/23/2016. This confirmed the presence of a severe pre occlusive high-grade stenosis of the left middle cerebral artery proximal M1 segment, and also about a 50% stenosis of the right middle cerebral artery M1 segment. Additionally seen were focal areas of caliber irregularity. At the present time the patient reports no symptoms of amaurosis fugax in the left eye, blindness, bilateral visual blurring, loss of consciousness, altered mental status, paresthesias, gait abnormalities. She denies any nausea, vomiting or swallowing difficulties. Past Medical History: Significant for headaches, high blood pressure, high  cholesterol and diabetes. History of a previous stroke as mentioned above. Previous Surgical History: Breast reduction in 2000, closure of a PFO in 2010. Medications: Aspirin, Hygroton, persantine, metformin, fluconazole, Neurontin, lamotrigine, Bystolic, Pravachol, Phenergan, Soliqua, tizanidine. She also takes oxycodone 10/325 for headaches 3 times a day as needed. Allergies: Plavix causes a diffuse rash.  Lipitor and Imitrex. Social History: Patient is single, has two boys and two girls, alive and well. She works at a [employer]. She drinks alcohol occasionally. Denies smoking cigarettes or using illicit chemicals. Family History: Asthma, breast carcinoma, diabetes in her mother, headaches, renal disease, and migraine headaches. REVIEW OF SYSTEMS: Denies any chest pain, shortness of breath, paraoxysmal nocturnal dyspnea  or peripheral edema. Denies any coughing, wheezing or hemoptysis. Denies any abdominal pain, difficulty swallowing, diarrhea or constipation. Denies any melanotic stools. Her weight is steady.  Appetite is normal. The patient denies any recent chills, fever or rigors. PHYSICAL EXAMINATION: In no acute distress.  Affect appears moderately depressed. Neurologically intact at time of visit. ASSESSMENT AND PLAN: The patient's recent MRI and catheter angiogram were reviewed in detail. These reveal the high-grade severe stenosis of the left middle cerebral artery proximally. It is felt given the patient's history and findings her symptoms are most likely related to the severe high-grade stenosis. The option of addition of Brilinta with medical surveillance versus consideration of endovascular revascularization of the left middle cerebral artery with balloon angioplasty and possibly stenting was also reviewed in detail. The procedure, the risks, benefits were all reviewed in detail. Risk of thromboembolic stroke or rupture of vessel or reperfusion hemorrhage with worsening neurological deficit and  death were all reviewed in detail. Questions are answered to her satisfaction. They would like to proceed with endovascular revascularization of the symptomatic left middle cerebral artery severe stenosis. This has been scheduled for this Monday. The patient will be started on Brilinta 90 mg b.i.d. She will undergo a platelet inhibition test [name] on Monday morning. Should this be within the therapy range, the patient has been scheduled to undergo endovascular revascularization with balloon angioplasty with possible stenting. She was asked to call should she have any concerns or questions. Electronically Signed   By: Julieanne Cotton M.D.   On: 07/01/2016 10:54    Labs:  CBC:  Recent Labs  07/03/16 0634 07/13/16 0723  WBC 7.4 7.1  HGB 13.1 12.7  HCT 40.9 40.5  PLT 233 233    COAGS:  Recent Labs  06/23/16 1110 07/03/16 0634 07/13/16 0723  INR 1.02 0.93 1.00  APTT  --  27 29    BMP:  Recent Labs  07/03/16 0634 07/13/16 0723  NA 141 141  K 3.9 4.0  CL 108 106  CO2 26 29  GLUCOSE 134* 110*  BUN 19 12  CALCIUM 9.7 9.6  CREATININE 1.27* 1.17*  GFRNONAA 49* 54*  GFRAA 56* >60    LIVER FUNCTION TESTS:  Recent Labs  07/13/16 0723  BILITOT 0.5  AST 20  ALT 17  ALKPHOS 115  PROT 7.2  ALBUMIN 3.8    TUMOR MARKERS: No results for input(s): AFPTM, CEA, CA199, CHROMGRNA in the last 8760 hours.  Assessment and Plan:  Recent CVA Left middle cerebral artery stenosis Now scheduled for cerebral arteriogram with possible angioplasty/stent of L MCA Risks and Benefits discussed with the patient including, but not limited to bleeding, infection, vascular injury, contrast induced renal failure, stroke or even death. All of the patient's questions were answered, patient is agreeable to proceed. Consent signed and in chart.  Pt aware she will be admitted to Neuro ICU after procedure overnight. Plan for discharge in am Agreeable to proceed.  Thank you for this  interesting consult.  I greatly enjoyed meeting MALYIAH FELLOWS and look forward to participating in their care.  A copy of this report was sent to the requesting provider on this date.  Electronically Signed: Lyndall Bellot A 07/24/2016, 6:57 AM   I spent a total of  30 Minutes   in face to face in clinical consultation, greater than 50% of which was counseling/coordinating care for cerebral arteriogram with poss intervention

## 2016-07-24 NOTE — Anesthesia Procedure Notes (Signed)
Procedure Name: Intubation Date/Time: 07/24/2016 9:49 AM Performed by: Rise PatienceBELL, Kismet Facemire T Pre-anesthesia Checklist: Patient identified, Emergency Drugs available, Suction available and Patient being monitored Patient Re-evaluated:Patient Re-evaluated prior to inductionOxygen Delivery Method: Circle System Utilized Preoxygenation: Pre-oxygenation with 100% oxygen Intubation Type: IV induction Ventilation: Mask ventilation without difficulty Laryngoscope Size: Miller and 2 Grade View: Grade I Tube type: Oral Tube size: 7.5 mm Number of attempts: 1 Airway Equipment and Method: Stylet and Oral airway Placement Confirmation: ETT inserted through vocal cords under direct vision,  positive ETCO2 and breath sounds checked- equal and bilateral Secured at: 22 cm Tube secured with: Tape Dental Injury: Teeth and Oropharynx as per pre-operative assessment

## 2016-07-24 NOTE — Progress Notes (Signed)
ANTICOAGULATION CONSULT NOTE - Initial Consult  Pharmacy Consult for heparin Indication: s/p cerebral angiogram  Allergies  Allergen Reactions  . Imitrex [Sumatriptan] Anaphylaxis  . Plavix [Clopidogrel Bisulfate] Hives  . Lipitor [Atorvastatin] Rash    Patient Measurements: Height: 5\' 5"  (165.1 cm) Weight: 218 lb 0.6 oz (98.9 kg) IBW/kg (Calculated) : 57  Vital Signs: Temp: 97.2 F (36.2 C) (10/02 1415) Temp Source: Oral (10/02 0737) BP: 117/94 (10/02 1339) Pulse Rate: 83 (10/02 1339)  Labs:  Recent Labs  07/24/16 0641  HGB 12.9  HCT 40.6  PLT 235  APTT 27  LABPROT 12.9  INR 0.97  CREATININE 1.11*    Estimated Creatinine Clearance: 71.4 mL/min (by C-G formula based on SCr of 1.11 mg/dL (H)).   Medical History: Past Medical History:  Diagnosis Date  . Anemia    in the past  . Asthma    " a long time ago"  . Bursitis   . Depression    13 years ago -   . Diabetes mellitus without complication (HCC)    type  2  . GERD (gastroesophageal reflux disease)    occ. acid reflux  . Headache    migraines  . Hypertension   . Kidney stones   . PFO (patent foramen ovale)    hx of PFO with surgery done as an adult  . Rotator cuff tear    right  . Stroke [facility](HCC) 2009   lasdt stroke July 2015  . Syncope and collapse     Medications:  Infusions:  . sodium chloride    . heparin    . niCARDipine      Assessment: 50 yof s/p cerebral angiogram to start IV heparin. Baseline CBC is WNL. Initially started on heparin 500 units/hr but due to pt weight will increase the dose slightly. She is not on anticoagulation.   Goal of Therapy:  Heparin level 0.1-0.25 units/ml Monitor platelets by anticoagulation protocol: Yes   Plan:  - Heparin gtt 700 units/hr - Check an 8 hr heparin level *Heparin off at 0700 tomorrow  Tracina Beaumont, Drake Leachachel Lynn 07/24/2016,2:57 PM

## 2016-07-24 NOTE — Transfer of Care (Signed)
Immediate Anesthesia Transfer of Care Note  Patient: Patricia [name]  Procedure(s) Performed: Procedure(s): Stenting ([name])  Patient Location: PACU  Anesthesia Type:General  Level of Consciousness: awake, alert  and oriented  Airway & Oxygen Therapy: Patient Spontanous Breathing and Patient connected to nasal cannula oxygen  Post-op Assessment: Report given to RN, Post -op Vital signs reviewed and stable and Patient moving all extremities X 4  Post vital signs: Reviewed and stable  Last Vitals:  Vitals:   07/24/16 0737  BP: (!) 178/105  Pulse: (!) 58  Resp: 18  Temp: 36.7 C    Last Pain:  Vitals:   07/24/16 0744  TempSrc:   PainSc: 8       Patients Stated Pain Goal: 9 (07/24/16 0744)  Complications: No apparent anesthesia complications

## 2016-07-24 NOTE — Progress Notes (Signed)
Referring Physician(s): Metro Kung  Supervising Physician: Julieanne Cotton  Patient Status:  Inpatient  Chief Complaint: Left middle cerebral artery stenosis   Subjective:  Patient complaining of right posterior headache, positional low back pain; no acute change from previous. Does have small amount of oozing from right groin puncture site but no distinct hematoma. Denies any visual blurriness or double vision.  Allergies: Imitrex [sumatriptan]; Plavix [clopidogrel bisulfate]; and Lipitor [atorvastatin]  Medications: Prior to Admission medications   Medication Sig Start Date End Date Taking? Authorizing Provider  aspirin EC 81 MG tablet Take 81 mg by mouth at bedtime.   Yes Historical Provider, MD  chlorthalidone (HYGROTON) 25 MG tablet Take 25 mg by mouth at bedtime.  09/03/15  Yes Historical Provider, MD  diphenhydramine-acetaminophen (TYLENOL PM) 25-500 MG TABS tablet Take 2 tablets by mouth at bedtime as needed (for pain or sleep).    Yes Historical Provider, MD  Empagliflozin-Metformin HCl (SYNJARDY) 12.02-999 MG TABS Take 1 tablet by mouth 2 (two) times daily.    Yes Historical Provider, MD  gabapentin (NEURONTIN) 250 MG/5ML solution Take 3 mLs (150 mg total) by mouth 3 (three) times daily. Patient taking differently: Take 150 mg by mouth at bedtime.  06/07/16  Yes Marvel Plan, MD  lamoTRIgine (LAMICTAL) 25 MG tablet 25mg  Qhs x 2 wks, then 25mg  bid x 2 wks, then 25mg  am/50mg  pm x 2 wks, then 50mg  bid x 2 wks, then 50mg  am/100mg  pm x 2 wks, then 100mg  bid Patient taking differently: Take 100 mg by mouth 2 (two) times daily.  05/15/16  Yes Marvel Plan, MD  Nebivolol HCl (BYSTOLIC) 20 MG TABS Take 20 mg by mouth every evening.    Yes Historical Provider, MD  oxyCODONE-acetaminophen (PERCOCET) 10-325 MG tablet Take 1 tablet by mouth every 4 (four) hours as needed for pain.   Yes Historical Provider, MD  pravastatin (PRAVACHOL) 40 MG tablet Take 40 mg by mouth at bedtime.   Yes  Historical Provider, MD  promethazine (PHENERGAN) 25 MG tablet Take 25 mg by mouth 3 (three) times daily as needed for nausea or vomiting.    Yes Historical Provider, MD  SOLIQUA 100-33 UNT-MCG/ML SOPN INJECT 32 UNITS UNDER THE SKIN DAILY in the evening AS NEEDED FOR BLOOD SUGAR 02/25/16  Yes Historical Provider, MD  ticagrelor (BRILINTA) 90 MG TABS tablet Take 45 mg by mouth 2 (two) times daily.    Yes Historical Provider, MD  tiZANidine (ZANAFLEX) 4 MG tablet Take 4 mg by mouth every 8 (eight) hours as needed (migraines).    Yes Historical Provider, MD  fluconazole (DIFLUCAN) 150 MG tablet Take 150 mg by mouth daily as needed (for yeast infections).    Historical Provider, MD     Vital Signs: BP 121/76   Pulse 74   Temp 97.2 F (36.2 C)   Resp 17   Ht 5\' 5"  (1.651 m)   Wt 218 lb 0.6 oz (98.9 kg)   SpO2 100%   BMI 36.28 kg/m   Physical Exam awake, alert. Tongue midline, face symmetrical, speech normal. Pupils equal round reactive to light, extraocular movements intact. No drift; nl finger to nose, fine motor movements. Strength 5 over 5 in all fours. Sensory function intact. Puncture site right common femoral artery with small amount of using, no distinct hematoma. Intact distal pulses.  Imaging: No results found.  Labs:  CBC:  Recent Labs  07/03/16 0634 07/13/16 0723 07/24/16 0641  WBC 7.4 7.1 7.6  HGB 13.1 12.7 12.9  HCT 40.9 40.5 40.6  PLT 233 233 235    COAGS:  Recent Labs  06/23/16 1110 07/03/16 0634 07/13/16 0723 07/24/16 0641  INR 1.02 0.93 1.00 0.97  APTT  --  27 29 27     BMP:  Recent Labs  07/03/16 0634 07/13/16 0723 07/24/16 0641  NA 141 141 141  K 3.9 4.0 4.2  CL 108 106 107  CO2 26 29 27   GLUCOSE 134* 110* 125*  BUN 19 12 17   CALCIUM 9.7 9.6 9.6  CREATININE 1.27* 1.17* 1.11*  GFRNONAA 49* 54* 57*  GFRAA 56* >60 >60    LIVER FUNCTION TESTS:  Recent Labs  07/13/16 0723  BILITOT 0.5  AST 20  ALT 17  ALKPHOS 115  PROT 7.2    ALBUMIN 3.8    Assessment and Plan: Patient with known severe left MCA stenosis; s/p stent-assisted angioplasty earlier today with residual 30% stenosis noted. For overnight observation. Continue current home medications, IV heparin. Sliding scale insulin for diabetes control; V pad and additional manual compression to the right groin site for next 15 minutes; frequent groin checks; 10 pound sandbag to site for  4 hours. Check a.m. labs.   Electronically Signed: D. Jeananne RamaKevin Allred 07/24/2016, 4:36 PM   I spent a total of 20 minutes at the the patient's bedside AND on the patient's hospital floor or unit, greater than 50% of which was counseling/coordinating care for cerebral arteriogram with left MCA stent assisted angioplasty    Patient ID: Patricia [name], female   DOB: October 31, 1965, 50 y.o.   MRN: [id number]

## 2016-07-24 NOTE — Anesthesia Postprocedure Evaluation (Signed)
Anesthesia Post Note  Patient: Patricia [name]  Procedure(s) Performed: Procedure(s) (LRB): Stenting ([name])  Patient location during evaluation: PACU Anesthesia Type: General Level of consciousness: sedated and patient cooperative Pain management: pain level controlled Vital Signs Assessment: post-procedure vital signs reviewed and stable Respiratory status: spontaneous breathing Cardiovascular status: stable Anesthetic complications: no    Last Vitals:  Vitals:   07/24/16 1445 07/24/16 1500  BP: 123/70 121/76  Pulse:  74  Resp: 19 17  Temp:      Last Pain:  Vitals:   07/24/16 0744  TempSrc:   PainSc: 8                  Lewie LoronJohn Jonnie Truxillo

## 2016-07-24 NOTE — Sedation Documentation (Signed)
Report given to Rhonda, Charity fMonticelloundraiserN. Right groin checked, dressing dry and intact.

## 2016-07-24 NOTE — Sedation Documentation (Signed)
6 Fr. Exoseal to right groin. 

## 2016-07-24 NOTE — Progress Notes (Signed)
Pam, PA notified of [name] at approximately 0750. Per Pam wait on Dr. Corliss Skainseveshwar to do anything further.

## 2016-07-25 ENCOUNTER — Encounter (HOSPITAL_COMMUNITY): Payer: Self-pay | Admitting: Interventional Radiology

## 2016-07-25 ENCOUNTER — Ambulatory Visit: Payer: BLUE CROSS/BLUE SHIELD

## 2016-07-25 LAB — CBC WITH DIFFERENTIAL/PLATELET
Basophils Absolute: 0 10*3/uL (ref 0.0–0.1)
Basophils Relative: 0 %
EOS ABS: 0.1 10*3/uL (ref 0.0–0.7)
EOS PCT: 1 %
HCT: 36.6 % (ref 36.0–46.0)
HEMOGLOBIN: 11.4 g/dL — AB (ref 12.0–15.0)
LYMPHS ABS: 2.3 10*3/uL (ref 0.7–4.0)
LYMPHS PCT: 35 %
MCH: 27.7 pg (ref 26.0–34.0)
MCHC: 31.1 g/dL (ref 30.0–36.0)
MCV: 89.1 fL (ref 78.0–100.0)
MONOS PCT: 7 %
Monocytes Absolute: 0.5 10*3/uL (ref 0.1–1.0)
Neutro Abs: 3.8 10*3/uL (ref 1.7–7.7)
Neutrophils Relative %: 57 %
PLATELETS: 204 10*3/uL (ref 150–400)
RBC: 4.11 MIL/uL (ref 3.87–5.11)
RDW: 13.2 % (ref 11.5–15.5)
WBC: 6.7 10*3/uL (ref 4.0–10.5)

## 2016-07-25 LAB — BASIC METABOLIC PANEL
Anion gap: 3 — ABNORMAL LOW (ref 5–15)
BUN: 11 mg/dL (ref 6–20)
CHLORIDE: 108 mmol/L (ref 101–111)
CO2: 26 mmol/L (ref 22–32)
CREATININE: 1.09 mg/dL — AB (ref 0.44–1.00)
Calcium: 8.9 mg/dL (ref 8.9–10.3)
GFR calc Af Amer: >60 mL/min (ref >60)
GFR calc non Af Amer: 59 mL/min — ABNORMAL LOW (ref >60)
Glucose, Bld: 91 mg/dL (ref 65–99)
POTASSIUM: 4.3 mmol/L (ref 3.5–5.1)
Sodium: 137 mmol/L (ref 135–145)

## 2016-07-25 LAB — HEPARIN LEVEL (UNFRACTIONATED): HEPARIN UNFRACTIONATED: 0.32 [IU]/mL (ref 0.30–0.70)

## 2016-07-25 LAB — GLUCOSE, CAPILLARY: Glucose-Capillary: 85 mg/dL (ref 65–99)

## 2016-07-25 LAB — PLATELET INHIBITION P2Y12: Platelet Function  P2Y12: 97 [PRU] — ABNORMAL LOW (ref 194–418)

## 2016-07-25 MED ORDER — HEPARIN (PORCINE) IN NACL 100-0.45 UNIT/ML-% IJ SOLN
600.0000 [IU]/h | INTRAMUSCULAR | Status: AC
  Administered 2016-07-25: 600 [IU]/h via INTRAVENOUS
  Filled 2016-07-25: qty 250

## 2016-07-25 NOTE — Progress Notes (Signed)
ANTICOAGULATION CONSULT NOTE Pharmacy Consult for heparin Indication: s/p cerebral angiogram  Allergies  Allergen Reactions  . Imitrex [Sumatriptan] Anaphylaxis  . Plavix [Clopidogrel Bisulfate] Hives  . Lipitor [Atorvastatin] Rash    Patient Measurements: Height: 5\' 5"  (165.1 cm) Weight: 218 lb 0.6 oz (98.9 kg) IBW/kg (Calculated) : 57  Vital Signs: Temp: 98.7 F (37.1 C) (10/03 0000) Temp Source: Oral (10/03 0000) BP: 135/83 (10/03 0200) Pulse Rate: 67 (10/03 0200)  Labs:  Recent Labs  07/24/16 0641 07/25/16 0107  HGB 12.9 11.4*  HCT 40.6 36.6  PLT 235 204  APTT 27  --   LABPROT 12.9  --   INR 0.97  --   HEPARINUNFRC  --  0.32  CREATININE 1.11* 1.09*    Estimated Creatinine Clearance: 72.7 mL/min (by C-G formula based on SCr of 1.09 mg/dL (H)).  Assessment: 50 yo female s/p cerebral angiogram for heparin.  Goal of Therapy:  Heparin level 0.1-0.25 units/ml Monitor platelets by anticoagulation protocol: Yes   Plan:  Decrease Heparin 600 units/hr  Patricia [name], Patricia [name] 07/25/2016,2:18 AM

## 2016-07-25 NOTE — Progress Notes (Signed)
Moderate oozing noted from right groin pressure dressing site.No hematoma palpated.   Dressing reinforced, 10 lb sandbag reapplied to right groin.  Pt on Heparin at 700 units /hr.  Pharmacy notified of increased oozing.  Heparin level ordered.  Distal pulses intact.Will keep a close watch on right groin.

## 2016-07-25 NOTE — Progress Notes (Signed)
Dc instructions given to pt.  Pt verbalized understanding.  No c/o pain.  No s/s of any acute distress.  Waiting on DTR to get off work and pick her up for dc.

## 2016-07-25 NOTE — Care Management Note (Signed)
Case Management Note  Patient Details  Name: Patricia [name] MRN: [id number] Date of Birth: Mar 12, 1966  Subjective/Objective:   Pt admitted on 07/24/16 s/p Lt CCA arteriogram, followed by stent assisted angioplasty of symptomatic Lt MCA stenosis.  PTA, pt independent, lives with spouse.                  Action/Plan: Pt for discharge home today.  No discharge needs identified.    Expected Discharge Date:      07/25/2016           Expected Discharge Plan:  Home/Self Care  In-House Referral:     Discharge planning Services  CM Consult  Post Acute Care Choice:    Choice offered to:     DME Arranged:    DME Agency:     HH Arranged:    HH Agency:     Status of Service:  Completed, signed off  If discussed at [employer] Length of Stay Meetings, dates discussed:    Additional Comments:  Quintella BatonJulie W. Agamjot Kilgallon, RN, BSN  Trauma/Neuro ICU Case Manager [phone]

## 2016-07-25 NOTE — Progress Notes (Signed)
Pt leaves for dc via wheelchair at this time.  No s/s of any acute distress or pain noted.

## 2016-07-25 NOTE — Discharge Summary (Signed)
Patient ID: Patricia [name] MRN: [id number] DOB/AGE: [birth year] 50 y.o.  Admit date: 07/24/2016 Discharge date: 07/25/2016  Supervising Physician: Julieanne Cotton  Admission Diagnoses: CVA                                         Left middle cerebral artery stenosis  Discharge Diagnoses:  Active Problems:   Middle cerebral artery stenosis, left   Discharged Condition: stable  Hospital Course: CVA; Rt sided weakness; Rt side headache Cerebral arteriogram revealed L middle cerebral artery stenosis. Referred to Dr Corliss Skains and intervention was performed 10/2 in IR: L MCA angioplasty/stent placement. Pt tolerated procedure well; admitted to Neuro ICU overnight without event. Eating well; slept well Denies N/V Denies visual or speech issues. Does complain of frontal Bilateral headache---resolving UOP great- yellow; passing gas Dr Corliss Skains has seen and examined pt. Plan for discharge today.  Consults: None  Significant Diagnostic Studies: Cerebral arteriogram  Treatments:  CEREBRAL ANGIOGRAM [[id number] (Custom)]       Lt CCA arteriogram,follwed by Stent assisted angioplasty of symptomatic LT MCA stenosis  With  residual 30 % stenosis       Discharge Exam: Blood pressure (!) 141/89, pulse 71, temperature 98.8 F (37.1 C), temperature source Oral, resp. rate 20, height 5\' 5"  (1.651 m), weight 218 lb 0.6 oz (98.9 kg), SpO2 99 %.  PE: A/O Pleasant Rested Face symmetrical Tongue midline Smile= Coordination intact  Heart: RRR Lungs: CTA Abd: soft +BS NT No masses Ext: FROM; good sensation Good strength = Rt groin: NT no active bleeding No hematoma Rt foot: 2+ pulses UOP great: yellow/clear  Results for orders placed or performed during the hospital encounter of 07/24/16  MRSA PCR Screening  Result Value Ref Range   MRSA by PCR NEGATIVE NEGATIVE  APTT  Result Value Ref Range   aPTT 27 24 - 36 seconds  Basic metabolic panel  Result Value Ref  Range   Sodium 141 135 - 145 mmol/L   Potassium 4.2 3.5 - 5.1 mmol/L   Chloride 107 101 - 111 mmol/L   CO2 27 22 - 32 mmol/L   Glucose, Bld 125 (H) 65 - 99 mg/dL   BUN 17 6 - 20 mg/dL   Creatinine, Ser 1.47 (H) 0.44 - 1.00 mg/dL   Calcium 9.6 8.9 - 82.9 mg/dL   GFR calc non Af Amer 57 (L) >60 mL/min   GFR calc Af Amer >60 >60 mL/min   Anion gap 7 5 - 15  CBC WITH DIFFERENTIAL  Result Value Ref Range   WBC 7.6 4.0 - 10.5 K/uL   RBC 4.60 3.87 - 5.11 MIL/uL   Hemoglobin 12.9 12.0 - 15.0 g/dL   HCT 56.2 13.0 - 86.5 %   MCV 88.3 78.0 - 100.0 fL   MCH 28.0 26.0 - 34.0 pg   MCHC 31.8 30.0 - 36.0 g/dL   RDW 78.4 69.6 - 29.5 %   Platelets 235 150 - 400 K/uL   Neutrophils Relative % 59 %   Neutro Abs 4.4 1.7 - 7.7 K/uL   Lymphocytes Relative 33 %   Lymphs Abs 2.5 0.7 - 4.0 K/uL   Monocytes Relative 7 %   Monocytes Absolute 0.6 0.1 - 1.0 K/uL   Eosinophils Relative 1 %   Eosinophils Absolute 0.1 0.0 - 0.7 K/uL   Basophils Relative 0 %   Basophils Absolute 0.0 0.0 - 0.1  K/uL  Platelet inhibition p2y12 (not at [facility])  Result Value Ref Range   Platelet Function  [name] 20 (L) 194 - 418 PRU  Protime-INR  Result Value Ref Range   Prothrombin Time 12.9 11.4 - 15.2 seconds   INR 0.97   Glucose, capillary  Result Value Ref Range   Glucose-Capillary 108 (H) 65 - 99 mg/dL  Glucose, capillary  Result Value Ref Range   Glucose-Capillary 94 65 - 99 mg/dL   Comment 1 Notify RN    Comment 2 Document in Chart   Basic metabolic panel  Result Value Ref Range   Sodium 137 135 - 145 mmol/L   Potassium 4.3 3.5 - 5.1 mmol/L   Chloride 108 101 - 111 mmol/L   CO2 26 22 - 32 mmol/L   Glucose, Bld 91 65 - 99 mg/dL   BUN 11 6 - 20 mg/dL   Creatinine, Ser 4.40 (H) 0.44 - 1.00 mg/dL   Calcium 8.9 8.9 - 34.7 mg/dL   GFR calc non Af Amer 59 (L) >60 mL/min   GFR calc Af Amer >60 >60 mL/min   Anion gap 3 (L) 5 - 15  CBC WITH DIFFERENTIAL  Result Value Ref Range   WBC 6.7 4.0 - 10.5 K/uL   RBC 4.11  3.87 - 5.11 MIL/uL   Hemoglobin 11.4 (L) 12.0 - 15.0 g/dL   HCT 42.5 95.6 - 38.7 %   MCV 89.1 78.0 - 100.0 fL   MCH 27.7 26.0 - 34.0 pg   MCHC 31.1 30.0 - 36.0 g/dL   RDW 56.4 33.2 - 95.1 %   Platelets 204 150 - 400 K/uL   Neutrophils Relative % 57 %   Neutro Abs 3.8 1.7 - 7.7 K/uL   Lymphocytes Relative 35 %   Lymphs Abs 2.3 0.7 - 4.0 K/uL   Monocytes Relative 7 %   Monocytes Absolute 0.5 0.1 - 1.0 K/uL   Eosinophils Relative 1 %   Eosinophils Absolute 0.1 0.0 - 0.7 K/uL   Basophils Relative 0 %   Basophils Absolute 0.0 0.0 - 0.1 K/uL  Glucose, capillary  Result Value Ref Range   Glucose-Capillary 91 65 - 99 mg/dL  Heparin level (unfractionated)  Result Value Ref Range   Heparin Unfractionated 0.32 0.30 - 0.70 IU/mL    Disposition: L MCA stenosis: L MCA angioplasty/stent performed in IR with Dr Corliss Skains 07/24/16 Pt has tolerated procedure well Slight headache this am---more like normal migraine symptoms---resolving Plan to resume all meds Can restart Metformin 07/27/16 Continue ASA 325 mg and Brilinta 45 mg BID 2 week follow up with Dr Corliss Skains Pt has good understanding of plan and discharge instructions   Discharge Instructions    Call MD for:  difficulty breathing, headache or visual disturbances    Complete by:  As directed    Call MD for:  extreme fatigue    Complete by:  As directed    Call MD for:  hives    Complete by:  As directed    Call MD for:  persistant dizziness or light-headedness    Complete by:  As directed    Call MD for:  persistant nausea and vomiting    Complete by:  As directed    Call MD for:  redness, tenderness, or signs of infection (pain, swelling, redness, odor or green/yellow discharge around incision site)    Complete by:  As directed    Call MD for:  severe uncontrolled pain    Complete by:  As  directed    Call MD for:  temperature >100.4    Complete by:  As directed    Diet - low sodium heart healthy    Complete by:  As directed      Discharge instructions    Complete by:  As directed    May resume Metformin 10/5; continue ASA 325 mg daily; Brilinta 45 mg BID; resume home meds; 2 week follow up with Dr Corliss Skains ---pt will hear from scheduler for time and date; call [phone] with questions or concerns   Discharge wound care:    Complete by:  As directed    May shower tomorrow; may keep band aid on Rt groin ----daily x 1 week   Driving Restrictions    Complete by:  As directed    No driving 2 weeks   Increase activity slowly    Complete by:  As directed    Lifting restrictions    Complete by:  As directed    No lifting over 10 lbs x 2 weeks       Medication List    TAKE these medications   aspirin EC 81 MG tablet Take 81 mg by mouth at bedtime.   BYSTOLIC 20 MG Tabs Generic drug:  Nebivolol HCl Take 20 mg by mouth every evening.   chlorthalidone 25 MG tablet Commonly known as:  HYGROTON Take 25 mg by mouth at bedtime.   diphenhydramine-acetaminophen 25-500 MG Tabs tablet Commonly known as:  TYLENOL PM Take 2 tablets by mouth at bedtime as needed (for pain or sleep).   fluconazole 150 MG tablet Commonly known as:  DIFLUCAN Take 150 mg by mouth daily as needed (for yeast infections).   gabapentin 250 MG/5ML solution Commonly known as:  NEURONTIN Take 3 mLs (150 mg total) by mouth 3 (three) times daily. What changed:  when to take this   lamoTRIgine 25 MG tablet Commonly known as:  LAMICTAL 25mg  Qhs x 2 wks, then 25mg  bid x 2 wks, then 25mg  am/50mg  pm x 2 wks, then 50mg  bid x 2 wks, then 50mg  am/100mg  pm x 2 wks, then 100mg  bid What changed:  how much to take  how to take this  when to take this  additional instructions   oxyCODONE-acetaminophen 10-325 MG tablet Commonly known as:  PERCOCET Take 1 tablet by mouth every 4 (four) hours as needed for pain.   pravastatin 40 MG tablet Commonly known as:  PRAVACHOL Take 40 mg by mouth at bedtime.   promethazine 25 MG tablet Commonly  known as:  PHENERGAN Take 25 mg by mouth 3 (three) times daily as needed for nausea or vomiting.   SOLIQUA 100-33 UNT-MCG/ML Sopn Generic drug:  Insulin Glargine-Lixisenatide INJECT 32 UNITS UNDER THE SKIN DAILY in the evening AS NEEDED FOR BLOOD SUGAR   SYNJARDY 12.02-999 MG Tabs Generic drug:  Empagliflozin-Metformin HCl Take 1 tablet by mouth 2 (two) times daily.   ticagrelor 90 MG Tabs tablet Commonly known as:  BRILINTA Take 45 mg by mouth 2 (two) times daily.   tiZANidine 4 MG tablet Commonly known as:  ZANAFLEX Take 4 mg by mouth every 8 (eight) hours as needed (migraines).      Follow-up Information    DEVESHWAR, Grandville Silos, MD Follow up in 2 week(s).   Specialty:  Interventional Radiology Why:  pt will hear from scheduler for appt time and date; call [phone] if questions or concerns Contact information: [address] [place] [address] [phone]  Electronically Signed: Ralene MuskratURPIN,Dorsel Flinn A 07/25/2016, 9:32 AM   I have spent Greater Than 30 Minutes discharging [place] D [place].

## 2016-07-25 NOTE — Progress Notes (Signed)
At 0300 pt noted to have saturated abd pads x 2.  Manual pressure held for 20 minutes and 10 lb weight reapplied.  No hematoma palpated. Distal pulses intact.  Heparin remains at 600 units/hr.

## 2016-07-26 ENCOUNTER — Encounter (HOSPITAL_COMMUNITY): Payer: Self-pay | Admitting: Interventional Radiology

## 2016-07-26 LAB — GLUCOSE, CAPILLARY: GLUCOSE-CAPILLARY: 81 mg/dL (ref 65–99)

## 2016-07-28 ENCOUNTER — Inpatient Hospital Stay (HOSPITAL_COMMUNITY): Payer: BLUE CROSS/BLUE SHIELD | Admitting: Certified Registered Nurse Anesthetist

## 2016-07-28 ENCOUNTER — Inpatient Hospital Stay (HOSPITAL_COMMUNITY)
Admission: EM | Admit: 2016-07-28 | Discharge: 2016-07-30 | DRG: 252 | Disposition: A | Discharge status: Home Health | Payer: BLUE CROSS/BLUE SHIELD | Attending: Neurology | Admitting: Neurology

## 2016-07-28 ENCOUNTER — Encounter (HOSPITAL_COMMUNITY): Payer: Self-pay | Admitting: *Deleted

## 2016-07-28 ENCOUNTER — Inpatient Hospital Stay (HOSPITAL_COMMUNITY): Payer: BLUE CROSS/BLUE SHIELD

## 2016-07-28 ENCOUNTER — Emergency Department (HOSPITAL_COMMUNITY): Payer: BLUE CROSS/BLUE SHIELD

## 2016-07-28 ENCOUNTER — Encounter (HOSPITAL_COMMUNITY)
Admission: EM | Disposition: A | Discharge status: Home Health | Payer: Self-pay | Source: Home / Self Care | Attending: Neurology

## 2016-07-28 DIAGNOSIS — R471 Dysarthria and anarthria: Secondary | ICD-10-CM | POA: Diagnosis present

## 2016-07-28 DIAGNOSIS — K219 Gastro-esophageal reflux disease without esophagitis: Secondary | ICD-10-CM | POA: Diagnosis present

## 2016-07-28 DIAGNOSIS — I10 Essential (primary) hypertension: Secondary | ICD-10-CM | POA: Diagnosis present

## 2016-07-28 DIAGNOSIS — R4701 Aphasia: Secondary | ICD-10-CM | POA: Diagnosis present

## 2016-07-28 DIAGNOSIS — G8191 Hemiplegia, unspecified affecting right dominant side: Secondary | ICD-10-CM | POA: Diagnosis present

## 2016-07-28 DIAGNOSIS — E1151 Type 2 diabetes mellitus with diabetic peripheral angiopathy without gangrene: Secondary | ICD-10-CM | POA: Diagnosis present

## 2016-07-28 DIAGNOSIS — T82858A Stenosis of vascular prosthetic devices, implants and grafts, initial encounter: Principal | ICD-10-CM | POA: Diagnosis present

## 2016-07-28 DIAGNOSIS — Z6836 Body mass index (BMI) 36.0-36.9, adult: Secondary | ICD-10-CM

## 2016-07-28 DIAGNOSIS — J9600 Acute respiratory failure, unspecified whether with hypoxia or hypercapnia: Secondary | ICD-10-CM

## 2016-07-28 DIAGNOSIS — I6602 Occlusion and stenosis of left middle cerebral artery: Secondary | ICD-10-CM | POA: Diagnosis present

## 2016-07-28 DIAGNOSIS — Z8673 Personal history of transient ischemic attack (TIA), and cerebral infarction without residual deficits: Secondary | ICD-10-CM

## 2016-07-28 DIAGNOSIS — Z7902 Long term (current) use of antithrombotics/antiplatelets: Secondary | ICD-10-CM

## 2016-07-28 DIAGNOSIS — Y848 Other medical procedures as the cause of abnormal reaction of the patient, or of later complication, without mention of misadventure at the time of the procedure: Secondary | ICD-10-CM | POA: Diagnosis present

## 2016-07-28 DIAGNOSIS — Z01818 Encounter for other preprocedural examination: Secondary | ICD-10-CM

## 2016-07-28 DIAGNOSIS — I63312 Cerebral infarction due to thrombosis of left middle cerebral artery: Secondary | ICD-10-CM | POA: Diagnosis not present

## 2016-07-28 DIAGNOSIS — R29706 NIHSS score 6: Secondary | ICD-10-CM | POA: Diagnosis present

## 2016-07-28 DIAGNOSIS — Z7982 Long term (current) use of aspirin: Secondary | ICD-10-CM | POA: Diagnosis not present

## 2016-07-28 DIAGNOSIS — I63512 Cerebral infarction due to unspecified occlusion or stenosis of left middle cerebral artery: Secondary | ICD-10-CM

## 2016-07-28 DIAGNOSIS — Z8774 Personal history of (corrected) congenital malformations of heart and circulatory system: Secondary | ICD-10-CM

## 2016-07-28 DIAGNOSIS — Z794 Long term (current) use of insulin: Secondary | ICD-10-CM

## 2016-07-28 DIAGNOSIS — I639 Cerebral infarction, unspecified: Secondary | ICD-10-CM | POA: Diagnosis not present

## 2016-07-28 DIAGNOSIS — R1310 Dysphagia, unspecified: Secondary | ICD-10-CM | POA: Diagnosis present

## 2016-07-28 DIAGNOSIS — R29810 Facial weakness: Secondary | ICD-10-CM | POA: Diagnosis present

## 2016-07-28 DIAGNOSIS — E785 Hyperlipidemia, unspecified: Secondary | ICD-10-CM | POA: Diagnosis present

## 2016-07-28 DIAGNOSIS — Z79899 Other long term (current) drug therapy: Secondary | ICD-10-CM

## 2016-07-28 DIAGNOSIS — I63412 Cerebral infarction due to embolism of left middle cerebral artery: Secondary | ICD-10-CM | POA: Diagnosis present

## 2016-07-28 DIAGNOSIS — R269 Unspecified abnormalities of gait and mobility: Secondary | ICD-10-CM

## 2016-07-28 DIAGNOSIS — Q251 Coarctation of aorta: Secondary | ICD-10-CM

## 2016-07-28 DIAGNOSIS — I6300 Cerebral infarction due to thrombosis of unspecified precerebral artery: Secondary | ICD-10-CM

## 2016-07-28 HISTORY — PX: RADIOLOGY WITH ANESTHESIA: SHX6223

## 2016-07-28 HISTORY — PX: IR GENERIC HISTORICAL: IMG1180011

## 2016-07-28 LAB — BLOOD GAS, ARTERIAL
Acid-base deficit: 1.3 mmol/L (ref 0.0–2.0)
BICARBONATE: 22.2 mmol/L (ref 20.0–28.0)
Drawn by: 244851
FIO2: 100
LHR: 16 {breaths}/min
O2 SAT: 99.5 %
PATIENT TEMPERATURE: 98.6
PCO2 ART: 32.9 mmHg (ref 32.0–48.0)
PEEP: 5 cmH2O
PH ART: 7.444 (ref 7.350–7.450)
VT: 550 mL
pO2, Arterial: 417 mmHg — ABNORMAL HIGH (ref 83.0–108.0)

## 2016-07-28 LAB — I-STAT TROPONIN, ED: Troponin i, poc: 0 ng/mL (ref 0.00–0.08)

## 2016-07-28 LAB — TRIGLYCERIDES: Triglycerides: 143 mg/dL (ref <150)

## 2016-07-28 LAB — DIFFERENTIAL
Basophils Absolute: 0 10*3/uL (ref 0.0–0.1)
Basophils Relative: 0 %
EOS PCT: 1 %
Eosinophils Absolute: 0.1 10*3/uL (ref 0.0–0.7)
LYMPHS PCT: 31 %
Lymphs Abs: 2.6 10*3/uL (ref 0.7–4.0)
Monocytes Absolute: 0.7 10*3/uL (ref 0.1–1.0)
Monocytes Relative: 9 %
NEUTROS ABS: 5.1 10*3/uL (ref 1.7–7.7)
NEUTROS PCT: 59 %

## 2016-07-28 LAB — COMPREHENSIVE METABOLIC PANEL
ALBUMIN: 3.8 g/dL (ref 3.5–5.0)
ALK PHOS: 114 U/L (ref 38–126)
ALT: 32 U/L (ref 14–54)
ANION GAP: 10 (ref 5–15)
AST: 35 U/L (ref 15–41)
BUN: 13 mg/dL (ref 6–20)
CALCIUM: 9.6 mg/dL (ref 8.9–10.3)
CO2: 25 mmol/L (ref 22–32)
Chloride: 103 mmol/L (ref 101–111)
Creatinine, Ser: 1.2 mg/dL — ABNORMAL HIGH (ref 0.44–1.00)
GFR calc Af Amer: >60 mL/min (ref >60)
GFR calc non Af Amer: 52 mL/min — ABNORMAL LOW (ref >60)
GLUCOSE: 174 mg/dL — AB (ref 65–99)
Potassium: 4.1 mmol/L (ref 3.5–5.1)
SODIUM: 138 mmol/L (ref 135–145)
Total Bilirubin: 0.2 mg/dL — ABNORMAL LOW (ref 0.3–1.2)
Total Protein: 7.1 g/dL (ref 6.5–8.1)

## 2016-07-28 LAB — PROTIME-INR
INR: 0.9
PROTHROMBIN TIME: 12.1 s (ref 11.4–15.2)

## 2016-07-28 LAB — CBC
HCT: 39.6 % (ref 36.0–46.0)
Hemoglobin: 12.5 g/dL (ref 12.0–15.0)
MCH: 28.2 pg (ref 26.0–34.0)
MCHC: 31.6 g/dL (ref 30.0–36.0)
MCV: 89.2 fL (ref 78.0–100.0)
PLATELETS: 248 10*3/uL (ref 150–400)
RBC: 4.44 MIL/uL (ref 3.87–5.11)
RDW: 13.3 % (ref 11.5–15.5)
WBC: 8.6 10*3/uL (ref 4.0–10.5)

## 2016-07-28 LAB — POCT ACTIVATED CLOTTING TIME
Activated Clotting Time: 175 seconds
Activated Clotting Time: 180 seconds

## 2016-07-28 LAB — GLUCOSE, CAPILLARY
Glucose-Capillary: 102 mg/dL — ABNORMAL HIGH (ref 65–99)
Glucose-Capillary: 105 mg/dL — ABNORMAL HIGH (ref 65–99)
Glucose-Capillary: 95 mg/dL (ref 65–99)

## 2016-07-28 LAB — APTT: APTT: 30 s (ref 24–36)

## 2016-07-28 LAB — PLATELET INHIBITION P2Y12: Platelet Function  P2Y12: 116 [PRU] — ABNORMAL LOW (ref 194–418)

## 2016-07-28 LAB — HEPARIN LEVEL (UNFRACTIONATED): Heparin Unfractionated: 0.33 IU/mL (ref 0.30–0.70)

## 2016-07-28 LAB — CBG MONITORING, ED: GLUCOSE-CAPILLARY: 151 mg/dL — AB (ref 65–99)

## 2016-07-28 SURGERY — RADIOLOGY WITH ANESTHESIA
Anesthesia: General

## 2016-07-28 MED ORDER — FAMOTIDINE IN NACL 20-0.9 MG/50ML-% IV SOLN
20.0000 mg | Freq: Two times a day (BID) | INTRAVENOUS | Status: DC
  Administered 2016-07-28 – 2016-07-30 (×5): 20 mg via INTRAVENOUS
  Filled 2016-07-28 (×5): qty 50

## 2016-07-28 MED ORDER — FENTANYL CITRATE (PF) 100 MCG/2ML IJ SOLN
100.0000 ug | INTRAMUSCULAR | Status: DC | PRN
  Administered 2016-07-28 – 2016-07-29 (×5): 100 ug via INTRAVENOUS
  Filled 2016-07-28 (×5): qty 2

## 2016-07-28 MED ORDER — SODIUM CHLORIDE 0.9 % IV SOLN
INTRAVENOUS | Status: DC | PRN
  Administered 2016-07-28: 10:00:00 via INTRAVENOUS

## 2016-07-28 MED ORDER — ROCURONIUM BROMIDE 100 MG/10ML IV SOLN
INTRAVENOUS | Status: DC | PRN
  Administered 2016-07-28: 70 mg via INTRAVENOUS
  Administered 2016-07-28: 30 mg via INTRAVENOUS

## 2016-07-28 MED ORDER — SODIUM CHLORIDE 0.9 % IV SOLN
INTRAVENOUS | Status: DC
  Administered 2016-07-28 – 2016-07-29 (×3): via INTRAVENOUS

## 2016-07-28 MED ORDER — SENNOSIDES-DOCUSATE SODIUM 8.6-50 MG PO TABS: 1.0000 | ORAL_TABLET | Freq: Every evening | ORAL | Status: DC | PRN

## 2016-07-28 MED ORDER — EMPAGLIFLOZIN-METFORMIN HCL 12.5-1000 MG PO TABS: 1.0000 | ORAL_TABLET | Freq: Two times a day (BID) | ORAL | Status: DC

## 2016-07-28 MED ORDER — METFORMIN HCL 500 MG PO TABS: 1000.0000 mg | ORAL_TABLET | Freq: Two times a day (BID) | ORAL | Status: DC

## 2016-07-28 MED ORDER — MIDAZOLAM HCL 5 MG/5ML IJ SOLN
INTRAMUSCULAR | Status: DC | PRN
  Administered 2016-07-28: 2 mg via INTRAVENOUS

## 2016-07-28 MED ORDER — PROPOFOL 500 MG/50ML IV EMUL
INTRAVENOUS | Status: DC | PRN
  Administered 2016-07-28: 75 ug/kg/min via INTRAVENOUS

## 2016-07-28 MED ORDER — DOCUSATE SODIUM 50 MG/5ML PO LIQD: 100.0000 mg | Freq: Two times a day (BID) | ORAL | Status: DC | PRN

## 2016-07-28 MED ORDER — IOPAMIDOL (ISOVUE-300) INJECTION 61%
INTRAVENOUS | Status: AC
  Administered 2016-07-28: 100 mL
  Filled 2016-07-28: qty 300

## 2016-07-28 MED ORDER — STROKE: EARLY STAGES OF RECOVERY BOOK
Freq: Once | Status: DC
Start: 2016-07-28 — End: 2016-07-30
  Filled 2016-07-28: qty 1

## 2016-07-28 MED ORDER — ASPIRIN 81 MG PO CHEW
324.0000 mg | CHEWABLE_TABLET | Freq: Once | ORAL | Status: DC
  Filled 2016-07-28: qty 4

## 2016-07-28 MED ORDER — FENTANYL CITRATE (PF) 100 MCG/2ML IJ SOLN
INTRAMUSCULAR | Status: DC | PRN
  Administered 2016-07-28 (×2): 50 ug via INTRAVENOUS

## 2016-07-28 MED ORDER — HEPARIN (PORCINE) IN NACL 100-0.45 UNIT/ML-% IJ SOLN
650.0000 [IU]/h | INTRAMUSCULAR | Status: DC
  Administered 2016-07-28: 650 [IU]/h via INTRAVENOUS
  Filled 2016-07-28: qty 250

## 2016-07-28 MED ORDER — ACETAMINOPHEN 500 MG PO TABS: 1000.0000 mg | ORAL_TABLET | Freq: Four times a day (QID) | ORAL | Status: DC | PRN

## 2016-07-28 MED ORDER — ONDANSETRON HCL 4 MG/2ML IJ SOLN: 4.0000 mg | Freq: Four times a day (QID) | INTRAMUSCULAR | Status: DC | PRN

## 2016-07-28 MED ORDER — EPTIFIBATIDE 20 MG/10ML IV SOLN
INTRAVENOUS | Status: AC
  Filled 2016-07-28: qty 10

## 2016-07-28 MED ORDER — NITROGLYCERIN 1 MG/10 ML FOR IR/CATH LAB
INTRA_ARTERIAL | Status: AC
  Filled 2016-07-28: qty 10

## 2016-07-28 MED ORDER — ACETAMINOPHEN 650 MG RE SUPP: 650.0000 mg | Freq: Four times a day (QID) | RECTAL | Status: DC | PRN

## 2016-07-28 MED ORDER — LAMOTRIGINE 100 MG PO TABS
100.0000 mg | ORAL_TABLET | Freq: Two times a day (BID) | ORAL | Status: DC
  Administered 2016-07-28 – 2016-07-29 (×2): 100 mg via ORAL
  Filled 2016-07-28 (×2): qty 1

## 2016-07-28 MED ORDER — NEBIVOLOL HCL 10 MG PO TABS
20.0000 mg | ORAL_TABLET | Freq: Every evening | ORAL | Status: DC
  Administered 2016-07-28 – 2016-07-30 (×2): 20 mg via ORAL
  Filled 2016-07-28 (×3): qty 2

## 2016-07-28 MED ORDER — LIDOCAINE HCL (PF) 2 % IJ SOLN
INTRAMUSCULAR | Status: DC | PRN
  Administered 2016-07-28: 100 mg via INTRADERMAL

## 2016-07-28 MED ORDER — IOPAMIDOL (ISOVUE-370) INJECTION 76%
INTRAVENOUS | Status: AC
  Administered 2016-07-28: 100 mL
  Filled 2016-07-28: qty 100

## 2016-07-28 MED ORDER — ASPIRIN 325 MG PO TABS
325.0000 mg | ORAL_TABLET | Freq: Every day | ORAL | Status: DC
  Administered 2016-07-29: 325 mg via ORAL
  Filled 2016-07-28: qty 1

## 2016-07-28 MED ORDER — HEPARIN SODIUM (PORCINE) 1000 UNIT/ML IJ SOLN
INTRAMUSCULAR | Status: DC | PRN
  Administered 2016-07-28: 2000 [IU] via INTRAVENOUS

## 2016-07-28 MED ORDER — FENTANYL CITRATE (PF) 100 MCG/2ML IJ SOLN: 25.0000 ug | INTRAMUSCULAR | Status: DC | PRN

## 2016-07-28 MED ORDER — ASPIRIN EC 81 MG PO TBEC: 81.0000 mg | DELAYED_RELEASE_TABLET | Freq: Every day | ORAL | Status: DC

## 2016-07-28 MED ORDER — CEFAZOLIN SODIUM-DEXTROSE 2-4 GM/100ML-% IV SOLN
INTRAVENOUS | Status: AC
  Filled 2016-07-28: qty 100

## 2016-07-28 MED ORDER — PROMETHAZINE HCL 25 MG/ML IJ SOLN: 6.2500 mg | INTRAMUSCULAR | Status: DC | PRN

## 2016-07-28 MED ORDER — FENTANYL CITRATE (PF) 100 MCG/2ML IJ SOLN: 100.0000 ug | INTRAMUSCULAR | Status: DC | PRN

## 2016-07-28 MED ORDER — CHLORHEXIDINE GLUCONATE 0.12% ORAL RINSE (MEDLINE KIT)
15.0000 mL | Freq: Two times a day (BID) | OROMUCOSAL | Status: DC
  Administered 2016-07-28 – 2016-07-29 (×2): 15 mL via OROMUCOSAL

## 2016-07-28 MED ORDER — ORAL CARE MOUTH RINSE
15.0000 mL | OROMUCOSAL | Status: DC
  Administered 2016-07-28 – 2016-07-29 (×7): 15 mL via OROMUCOSAL

## 2016-07-28 MED ORDER — INSULIN ASPART 100 UNIT/ML ~~LOC~~ SOLN: 0.0000 [IU] | SUBCUTANEOUS | Status: DC

## 2016-07-28 MED ORDER — CEFAZOLIN IN D5W 1 GM/50ML IV SOLN
INTRAVENOUS | Status: DC | PRN
  Administered 2016-07-28: 2 g via INTRAVENOUS

## 2016-07-28 MED ORDER — SODIUM CHLORIDE 0.9 % IJ SOLN
INTRAVENOUS | Status: AC | PRN
  Administered 2016-07-28: 25 mg via INTRA_ARTERIAL

## 2016-07-28 MED ORDER — NICARDIPINE HCL IN NACL 20-0.86 MG/200ML-% IV SOLN
INTRAVENOUS | Status: AC
  Filled 2016-07-28: qty 200

## 2016-07-28 MED ORDER — EPTIFIBATIDE 20 MG/10ML IV SOLN
INTRAVENOUS | Status: AC | PRN
  Administered 2016-07-28 (×5): 1.8 mg via INTRAVENOUS

## 2016-07-28 MED ORDER — NICARDIPINE HCL IN NACL 20-0.86 MG/200ML-% IV SOLN
5.0000 mg/h | INTRAVENOUS | Status: DC
  Administered 2016-07-28: 5 mg/h via INTRAVENOUS

## 2016-07-28 MED ORDER — PROPOFOL 1000 MG/100ML IV EMUL
0.0000 ug/kg/min | INTRAVENOUS | Status: DC
  Administered 2016-07-28 – 2016-07-29 (×6): 50 ug/kg/min via INTRAVENOUS
  Filled 2016-07-28: qty 200
  Filled 2016-07-28 (×4): qty 100

## 2016-07-28 MED ORDER — TICAGRELOR 90 MG PO TABS: 45.0000 mg | ORAL_TABLET | Freq: Two times a day (BID) | ORAL | Status: DC

## 2016-07-28 MED ORDER — SUCCINYLCHOLINE CHLORIDE 20 MG/ML IJ SOLN
INTRAMUSCULAR | Status: DC | PRN
  Administered 2016-07-28: 120 mg via INTRAVENOUS

## 2016-07-28 MED ORDER — PROPOFOL 1000 MG/100ML IV EMUL
INTRAVENOUS | Status: AC
  Filled 2016-07-28: qty 100

## 2016-07-28 MED ORDER — PHENYLEPHRINE HCL 10 MG/ML IJ SOLN
INTRAVENOUS | Status: DC | PRN
  Administered 2016-07-28: 50 ug/min via INTRAVENOUS

## 2016-07-28 MED ORDER — SODIUM CHLORIDE 0.9 % IV SOLN
INTRAVENOUS | Status: DC
  Administered 2016-07-28: 10:00:00 via INTRAVENOUS

## 2016-07-28 MED ORDER — PROPOFOL 10 MG/ML IV BOLUS
INTRAVENOUS | Status: DC | PRN
  Administered 2016-07-28: 150 mg via INTRAVENOUS
  Administered 2016-07-28: 50 mg via INTRAVENOUS

## 2016-07-28 MED ORDER — TICAGRELOR 90 MG PO TABS
45.0000 mg | ORAL_TABLET | Freq: Two times a day (BID) | ORAL | Status: DC
  Administered 2016-07-28 – 2016-07-29 (×2): 45 mg via ORAL
  Filled 2016-07-28 (×4): qty 1

## 2016-07-28 MED ORDER — CANAGLIFLOZIN 300 MG PO TABS: 300.0000 mg | ORAL_TABLET | Freq: Every day | ORAL | Status: DC

## 2016-07-28 MED ORDER — NICARDIPINE HCL IN NACL 20-0.86 MG/200ML-% IV SOLN
3.0000 mg/h | INTRAVENOUS | Status: DC
  Administered 2016-07-28 – 2016-07-29 (×3): 5 mg/h via INTRAVENOUS
  Filled 2016-07-28 (×3): qty 200

## 2016-07-28 MED ORDER — MEPERIDINE HCL 25 MG/ML IJ SOLN: 6.2500 mg | INTRAMUSCULAR | Status: DC | PRN

## 2016-07-28 NOTE — H&P (Signed)
NEURO HOSPITALIST CONSULT NOTE   Requestig physician: Audry Piliyler Mohr PA   Reason for Consult:stroke     HPI:                                                                                                                                          Patricia [name] is an 50 y.o. female with history of multiple vascular risk factors including hypertension diabetes and multiple small left MCA stroke with minimal residual deficit came in with new left-sided weakness and dysarthria. Patient came to the emergency room around 7:20 AM Neurologist was paged at 8:15 AM but code stroke was not activated. Finally code stroke was activated at 9:03 AM. Delay was because of communication and getting IV and CTA. This is considered as a wake up stroke. Patient noticed noticed some drooling on the left side around 9:30 PM last night and went to sleep and woke up at 5 AM with numbness and worsening left-sided weakness and dysarthria. Initial NIH score 6 with mild left-sided weakness involving arm and leg left facial droop and numbness and dysarthria. Patient recently received a stent on 07/24/2016 in supraclenoid portion of ICH and proximal left M1 because patient had prior history of multiple small left MCA stroke secondary to left M1 stenosis CT perfusion study performed today significant perfusion mismatch suggesting large penumbra. Case was discussed with interventional radiologist and after getting informed consent from the patient it was decided she'll be getting the angiogram for further management. Patient is already on aspirin aspirin 325 mg daily, brilanta 45 mg twice a day, statin therapy patient states that she took her morning dose of her medications  Currently she denies any chest pain palpitation fever chills night sweats no change in bowel or urinary habits. Past Medical History:  Diagnosis Date  . Anemia    in the past  . Asthma    " a long time ago"  . Bursitis   .  Depression    13 years ago -   . Diabetes mellitus without complication (HCC)    type  2  . GERD (gastroesophageal reflux disease)    occ. acid reflux  . Headache    migraines  . Hypertension   . Kidney stones   . PFO (patent foramen ovale)    hx of PFO with surgery done as an adult  . Rotator cuff tear    right  . Stroke [facility](H[facility]) 2009   lasdt stroke July 2015  . Syncope and collapse     Past Surgical History:  Procedure Laterality Date  . ABDOMINAL HYSTERECTOMY    . BREAST REDUCTION SURGERY    . CARDIAC CATHETERIZATION    . CARDIAC SURGERY  Closed hole in heart in 2009   Amplatzer Septal Occluder Ref  [id number] (not listed in op notes, document scanned under MRI with written op notes  . HYSTEROTOMY    . IR GENERIC HISTORICAL  06/23/2016   IR ANGIO INTRA EXTRACRAN SEL COM CAROTID INNOMINATE BILAT MOD SED 06/23/2016 Julieanne Cotton, MD [facility]  . IR GENERIC HISTORICAL  06/23/2016   IR ANGIO VERTEBRAL SEL VERTEBRAL BILAT MOD SED 06/23/2016 Julieanne Cotton, MD [facility]  . IR GENERIC HISTORICAL  06/29/2016   IR RADIOLOGIST EVAL & MGMT 06/29/2016 [facility]  . IR GENERIC HISTORICAL  07/24/2016   IR INTRA CRAN STENT 07/24/2016 Julieanne Cotton, MD [facility]  . RADIOLOGY WITH ANESTHESIA [name] 07/24/2016   Procedure: Stenting;  Surgeon: Julieanne Cotton, MD;  Location: [facility] OR;  Service: Radiology;  Laterality: [name];  . SHOULDER ARTHROSCOPY WITH OPEN ROTATOR CUFF REPAIR AND DISTAL CLAVICLE ACROMINECTOMY Right 02/04/2015   Procedure: SHOULDER ARTHROSCOPY WITH SUBACROMIAL DECOMPRESSION AND OPEN ROTATOR CUFF REPAIR,;  Surgeon: Cammy Copa, MD;  Location: [facility] OR;  Service: Orthopedics;  Laterality: Right;  RIGHT SHOULDER DOA,SAD,MINI-OPEN ROTATOR CUFF TEAR REPAIR.    Family History  Problem Relation Age of Onset  . Hypertension Mother   . Hypertension Father   . Cancer Maternal Aunt   . Cancer Maternal Uncle      Social History:  reports that she has never smoked. She has  never used smokeless tobacco. She reports that she drinks alcohol. She reports that she does not use drugs.  Allergies  Allergen Reactions  . Imitrex [Sumatriptan] Anaphylaxis  . Plavix [Clopidogrel Bisulfate] Hives  . Lipitor [Atorvastatin] Rash    Home Medications                      Prior to Admission medications   Medication Sig Start Date End Date Taking? Authorizing Provider  aspirin EC 81 MG tablet Take 81 mg by mouth at bedtime.    Historical Provider, MD  chlorthalidone (HYGROTON) 25 MG tablet Take 25 mg by mouth at bedtime.  09/03/15   Historical Provider, MD  diphenhydramine-acetaminophen (TYLENOL PM) 25-500 MG TABS tablet Take 2 tablets by mouth at bedtime as needed (for pain or sleep).     Historical Provider, MD  Empagliflozin-Metformin HCl (SYNJARDY) 12.02-999 MG TABS Take 1 tablet by mouth 2 (two) times daily.     Historical Provider, MD  fluconazole (DIFLUCAN) 150 MG tablet Take 150 mg by mouth daily as needed (for yeast infections).    Historical Provider, MD  gabapentin (NEURONTIN) 250 MG/5ML solution Take 3 mLs (150 mg total) by mouth 3 (three) times daily. Patient taking differently: Take 150 mg by mouth at bedtime.  06/07/16   Marvel Plan, MD  lamoTRIgine (LAMICTAL) 25 MG tablet 25mg  Qhs x 2 wks, then 25mg  bid x 2 wks, then 25mg  am/50mg  pm x 2 wks, then 50mg  bid x 2 wks, then 50mg  am/100mg  pm x 2 wks, then 100mg  bid Patient taking differently: Take 100 mg by mouth 2 (two) times daily.  05/15/16   Marvel Plan, MD  Nebivolol HCl (BYSTOLIC) 20 MG TABS Take 20 mg by mouth every evening.     Historical Provider, MD  oxyCODONE-acetaminophen (PERCOCET) 10-325 MG tablet Take 1 tablet by mouth every 4 (four) hours as needed for pain.    Historical Provider, MD  pravastatin (PRAVACHOL) 40 MG tablet Take 40 mg by mouth at bedtime.    Historical Provider, MD  promethazine (PHENERGAN) 25 MG tablet Take 25 mg by mouth 3 (three) times daily as needed  for  nausea or vomiting.     Historical Provider, MD  SOLIQUA 100-33 UNT-MCG/ML SOPN INJECT 32 UNITS UNDER THE SKIN DAILY in the evening AS NEEDED FOR BLOOD SUGAR 02/25/16   Historical Provider, MD  ticagrelor (BRILINTA) 90 MG TABS tablet Take 45 mg by mouth 2 (two) times daily.     Historical Provider, MD  tiZANidine (ZANAFLEX) 4 MG tablet Take 4 mg by mouth every 8 (eight) hours as needed (migraines).     Historical Provider, MD       Neurologic Examination:                                                                                                     Gen. lying in bed and appears comfortable Neck supple without any lymphadenopathy Lungs clear to auscultation CVS S1-S2 regular Skin no signs or scars or bruises Mental Status: Alert and oriented  Cranial Nerves: Pupils equal and reactive light and accommodation, extraocular movement are intact, right facial droop, numbness of the right side of the face, tongue in midline shoulder shrug intact Motor: Right : Upper extremity   4/5    Left:     Upper extremity   5/5  Lower extremity   4+5     Lower extremity   5/5 Tone and bulk:normal tone throughout; no atrophy noted Sensory: Impaired sensation to touch in right side of arm and leg  Deep Tendon Reflexes: 2+ and symmetric throughout Plantars: Right: downgoing   Left: downgoing Cerebellar: Finger to nose intact Gait: Gait not assessed      Lab Results: Basic Metabolic Panel:  Recent Labs Lab 07/24/16 0641 07/25/16 0107 07/28/16 0732  NA 141 137 138  K 4.2 4.3 4.1  CL 107 108 103  CO2 27 26 25   GLUCOSE 125* 91 174*  BUN 17 11 13   CREATININE 1.11* 1.09* 1.20*  CALCIUM 9.6 8.9 9.6    Liver Function Tests:  Recent Labs Lab 07/28/16 0732  AST 35  ALT 32  ALKPHOS 114  BILITOT 0.2*  PROT 7.1  ALBUMIN 3.8   No results for input(s): LIPASE, AMYLASE in the last 168 hours. No results for input(s): AMMONIA in the last 168 hours.  CBC:  Recent Labs Lab  07/24/16 0641 07/25/16 0107 07/28/16 0732  WBC 7.6 6.7 8.6  NEUTROABS 4.4 3.8 5.1  HGB 12.9 11.4* 12.5  HCT 40.6 36.6 39.6  MCV 88.3 89.1 89.2  PLT 235 204 248    Cardiac Enzymes: No results for input(s): CKTOTAL, CKMB, CKMBINDEX, TROPONINI in the last 168 hours.  Lipid Panel: No results for input(s): CHOL, TRIG, HDL, CHOLHDL, VLDL, LDLCALC in the last 168 hours.  CBG:  Recent Labs Lab 07/24/16 1653 07/24/16 2230 07/25/16 0752 07/25/16 1147 07/28/16 0825  GLUCAP 94 91 85 81 151*    Microbiology: Results for orders placed or performed during the hospital encounter of 07/24/16  MRSA PCR Screening     Status: None   Collection Time: 07/24/16  2:44 PM  Result Value Ref Range Status   MRSA by PCR NEGATIVE NEGATIVE Final  Comment:        The GeneXpert MRSA Assay (FDA approved for NASAL specimens only), is one component of a comprehensive MRSA colonization surveillance program. It is not intended to diagnose MRSA infection nor to guide or monitor treatment for MRSA infections.     Coagulation Studies:  Recent Labs  07/28/16 0732  LABPROT 12.1  INR 0.90    Imaging: Ct Head Wo Contrast  Result Date: 07/28/2016 CLINICAL DATA:  Pt had right arm weakness and right sided facial numbness last night. Pt woke up with slurred speech this AM. Pt had stent placed on Monday. EXAM: CT HEAD WITHOUT CONTRAST TECHNIQUE: Contiguous axial images were obtained from the base of the skull through the vertex without intravenous contrast. COMPARISON:  08/13/2015 FINDINGS: Brain: The ventricles are normal in size and configuration. There are no parenchymal masses or mass effect. Focal area of hypoattenuation in the subcortical white matter of the left frontal lobe, consistent with an old infarct, is stable. There is minor periventricular white matter hypoattenuation consistent with chronic microvascular ischemic change, also stable. There is no evidence of a recent infarct. There are  no extra-axial masses or abnormal fluid collections. There is no intracranial hemorrhage. Vascular: Left middle cerebral artery stent is new since the prior study. Vessels of the skullbase are otherwise unremarkable. Skull: Normal. Negative for fracture or focal lesion. Sinuses/Orbits: No acute finding. Other: None IMPRESSION: 1. No acute intracranial abnormalities. 2. 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 Minor chronic microvascular ischemic change. 3. Left middle cerebral artery stent new since the prior study. No other change. Electronically Signed   By: Amie Portland M.D.   On: 07/28/2016 08:35      Assessment and recommendation This is a 50 year old female with multiple vascular risk factors including multiple small left MCA stroke secondary to left M1 stenosis status post stent placement on 07/24/2016 came in with the dysarthria and right-sided weakness. Last known normal at 9:30 PM last night. CT perfusion study performed in the emergency room showed significant mismatch in left MCA territory suggesting salvageable penumbra. Case was discussed with intervention radiologist after taking informed consent from the patient it was decided take her to IR suite for further management.   07/28/2016, 10:04 AM

## 2016-07-28 NOTE — Consult Note (Signed)
PULMONARY / CRITICAL CARE MEDICINE   Name: Patricia [name] MRN: [id number] DOB: 1966-07-10    ADMISSION DATE:  07/28/2016 CONSULTATION DATE:  07/28/16  REFERRING MD:  Dr. Desmond Lope / Stroke MD  CHIEF COMPLAINT:  Slurred speech, right sided weakness & difficulty swallowing  HISTORY OF PRESENT ILLNESS:   50 y/o F with PMH of anemia, depression, headaches, GERD, DM, HTN, renal stones, syncope, PFO, CVA (2009) s/p repair who presented to [facility] on am of 10/6 with reports of right sided weakness, slurred speech & difficulty swallowing.   Of note, the patient was seen on Monday 07/24/16 for left MCA angioplasty with stent placement due to 95% stenosis.  At baseline, she is on brillinta and ASA.    Neurology was consulted on admission for CODE STROKE.  Clinically, she was noted to have right sided facial droop and decreased sensation on the right side.  She was deemed outside the window for tPA.  She was evaluated with a CT of the head which showed no acute abnormalities, small left frontal lobe subcortical white matter infarct, minor chronic microvascular ischemic changes, L MCA stent.  CTA head pending.  She was evaluated by neuro-IR and taken for intervention.  She underwent a left CCA arteriogram followed by balloon angioplasty x3 for severe/near occlusive instent stenosis.  Pt returned to ICU on mechanical ventilation.   PCCM consulted for ICU assistance / vent management.   PAST MEDICAL HISTORY :  She  has a past medical history of Anemia; Asthma; Bursitis; Depression; Diabetes mellitus without complication (HCC); GERD (gastroesophageal reflux disease); Headache; Hypertension; Kidney stones; PFO (patent foramen ovale); Rotator cuff tear; Stroke [facility]) (2009); and Syncope and collapse.  PAST SURGICAL HISTORY: She  has a past surgical history that includes Cardiac surgery (Closed hole in heart in 2009); Hysterotomy; Breast reduction surgery; Cardiac catheterization; Abdominal hysterectomy; Shoulder  arthroscopy with open rotator cuff repair and distal clavicle acrominectomy (Right, 02/04/2015); ir generic historical (06/23/2016); ir generic historical (06/23/2016); ir generic historical (06/29/2016); Radiology with anesthesia ([name], 07/24/2016); and ir generic historical (07/24/2016).  Allergies  Allergen Reactions  . Imitrex [Sumatriptan] Anaphylaxis  . Plavix [Clopidogrel Bisulfate] Hives  . Lipitor [Atorvastatin] Rash    No current facility-administered medications on file prior to encounter.    Current Outpatient Prescriptions on File Prior to Encounter  Medication Sig  . aspirin EC 81 MG tablet Take 81 mg by mouth at bedtime.  . chlorthalidone (HYGROTON) 25 MG tablet Take 25 mg by mouth at bedtime.   . diphenhydramine-acetaminophen (TYLENOL PM) 25-500 MG TABS tablet Take 2 tablets by mouth at bedtime as needed (for pain or sleep).   . Empagliflozin-Metformin HCl (SYNJARDY) 12.02-999 MG TABS Take 1 tablet by mouth 2 (two) times daily.   . fluconazole (DIFLUCAN) 150 MG tablet Take 150 mg by mouth daily as needed (for yeast infections).  . gabapentin (NEURONTIN) 250 MG/5ML solution Take 3 mLs (150 mg total) by mouth 3 (three) times daily. (Patient taking differently: Take 150 mg by mouth at bedtime. )  . lamoTRIgine (LAMICTAL) 25 MG tablet 25mg  Qhs x 2 wks, then 25mg  bid x 2 wks, then 25mg  am/50mg  pm x 2 wks, then 50mg  bid x 2 wks, then 50mg  am/100mg  pm x 2 wks, then 100mg  bid (Patient taking differently: Take 100 mg by mouth 2 (two) times daily. )  . Nebivolol HCl (BYSTOLIC) 20 MG TABS Take 20 mg by mouth every evening.   Marland Kitchen oxyCODONE-acetaminophen (PERCOCET) 10-325 MG tablet Take 1 tablet by mouth  every 4 (four) hours as needed for pain.  . pravastatin (PRAVACHOL) 40 MG tablet Take 40 mg by mouth at bedtime.  . promethazine (PHENERGAN) 25 MG tablet Take 25 mg by mouth 3 (three) times daily as needed for nausea or vomiting.   [name] 100-33 UNT-MCG/ML SOPN INJECT 32 UNITS UNDER THE SKIN DAILY  in the evening AS NEEDED FOR BLOOD SUGAR  . ticagrelor (BRILINTA) 90 MG TABS tablet Take 45 mg by mouth 2 (two) times daily.   Marland Kitchen tiZANidine (ZANAFLEX) 4 MG tablet Take 4 mg by mouth every 8 (eight) hours as needed (migraines).     FAMILY HISTORY:  Her indicated that her mother is alive. She indicated that her father is deceased. She indicated that her sister is alive. She indicated that all of her three brothers are alive. She indicated that the status of her maternal aunt is unknown. She indicated that the status of her maternal uncle is unknown.    SOCIAL HISTORY: She  reports that she has never smoked. She has never used smokeless tobacco. She reports that she drinks alcohol. She reports that she does not use drugs.  REVIEW OF SYSTEMS:  Unable to complete with patient due to sedation / mechanical ventilation.   SUBJECTIVE:  RN reports no acute events.  Per report, pt woke / moving all ext's prior to procedure, had minimal facial numbness.    VITAL SIGNS: BP 142/80   Pulse 67   Temp 98.4 F (36.9 C) (Oral)   Resp 15   Ht 5' 5.5" (1.664 m)   Wt 219 lb 4.8 oz (99.5 kg)   SpO2 100%   BMI 35.94 kg/m   HEMODYNAMICS:    VENTILATOR SETTINGS:    INTAKE / OUTPUT: No intake/output data recorded.  PHYSICAL EXAMINATION: General:  Morbidly obese female in NAD on vent  Neuro:  Sedate, spontaneous movement noted HEENT:  ETT in place, MM pink/moist, no jvd Cardiovascular:  s1s2 rrr, no m/r/g  Lungs:  Even/non-labored, lungs bilaterally clear  Abdomen:  Obese/soft, bsx4 active Musculoskeletal:  No acute deformities  Skin:  Warm/dry, no edema   LABS:  BMET  Recent Labs Lab 07/24/16 0641 07/25/16 0107 07/28/16 0732  NA 141 137 138  K 4.2 4.3 4.1  CL 107 108 103  CO2 27 26 25   BUN 17 11 13   CREATININE 1.11* 1.09* 1.20*  GLUCOSE 125* 91 174*    Electrolytes  Recent Labs Lab 07/24/16 0641 07/25/16 0107 07/28/16 0732  CALCIUM 9.6 8.9 9.6    CBC  Recent  Labs Lab 07/24/16 0641 07/25/16 0107 07/28/16 0732  WBC 7.6 6.7 8.6  HGB 12.9 11.4* 12.5  HCT 40.6 36.6 39.6  PLT 235 204 248    Coag's  Recent Labs Lab 07/24/16 0641 07/28/16 0732  APTT 27 30  INR 0.97 0.90    Sepsis Markers No results for input(s): LATICACIDVEN, PROCALCITON, O2SATVEN in the last 168 hours.  ABG No results for input(s): PHART, PCO2ART, PO2ART in the last 168 hours.  Liver Enzymes  Recent Labs Lab 07/28/16 0732  AST 35  ALT 32  ALKPHOS 114  BILITOT 0.2*  ALBUMIN 3.8    Cardiac Enzymes No results for input(s): TROPONINI, PROBNP in the last 168 hours.  Glucose  Recent Labs Lab 07/24/16 1345 07/24/16 1653 07/24/16 2230 07/25/16 0752 07/25/16 1147 07/28/16 0825  GLUCAP 106* 94 91 85 81 151*    Imaging Ct Head Wo Contrast  Result Date: 07/28/2016 CLINICAL DATA:  Pt had  right arm weakness and right sided facial numbness last night. Pt woke up with slurred speech this AM. Pt had stent placed on Monday. EXAM: CT HEAD WITHOUT CONTRAST TECHNIQUE: Contiguous axial images were obtained from the base of the skull through the vertex without intravenous contrast. COMPARISON:  08/13/2015 FINDINGS: Brain: The ventricles are normal in size and configuration. There are no parenchymal masses or mass effect. Focal area of hypoattenuation in the subcortical white matter of the left frontal lobe, consistent with an old infarct, is stable. There is minor periventricular white matter hypoattenuation consistent with chronic microvascular ischemic change, also stable. There is no evidence of a recent infarct. There are no extra-axial masses or abnormal fluid collections. There is no intracranial hemorrhage. Vascular: Left middle cerebral artery stent is new since the prior study. Vessels of the skullbase are otherwise unremarkable. Skull: Normal. Negative for fracture or focal lesion. Sinuses/Orbits: No acute finding. Other: None IMPRESSION: 1. No acute intracranial  abnormalities. 2. 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 Minor chronic microvascular ischemic change. 3. Left middle cerebral artery stent new since the prior study. No other change. Electronically Signed   By: Amie Portland M.D.   On: 07/28/2016 08:35   Ct Cerebral Perfusion W Contrast  Result Date: 07/28/2016 CLINICAL DATA:  Right-sided weakness, numbness, and slurred speech. EXAM: CT ANGIOGRAPHY HEAD AND NECK CT CEREBRAL PERFUSION WITH CONTRAST TECHNIQUE: Multidetector CT imaging of the head and neck was performed using the standard protocol during bolus administration of intravenous contrast. Multiplanar CT image reconstructions and MIPs were obtained to evaluate the vascular anatomy. Carotid stenosis measurements (when applicable) are obtained utilizing NASCET criteria, using the distal internal carotid diameter as the denominator. CT cerebral perfusion imaging was also performed during the dynamic injection of intravenous contrast with subsequent post processing. CONTRAST:  100 mL Isovue 370 COMPARISON:  Cerebral angiogram 07/24/2016. Head MRI 06/18/2016. Head MRA 02/21/2015. Head and neck CTA 07/03/2014. FINDINGS: CTA NECK FINDINGS Aortic arch: 3 vessel aortic arch. Brachiocephalic and subclavian arteries are widely patent. Right carotid system: Patent without evidence of stenosis or dissection. Left carotid system: Patent without evidence of stenosis or dissection. Vertebral arteries: Patent without evidence of stenosis or dissection. Right vertebral artery is minimally larger than the left. Skeleton: Mild cervical spondylosis and facet arthrosis. Other neck: No evidence of acute abnormality or mass. Upper chest: Clear lung apices. Review of the MIP images confirms the above findings CTA HEAD FINDINGS Anterior circulation: Internal carotid arteries are patent from skullbase to carotid termini with left greater than right carotid siphon atherosclerosis without evidence of significant  stenosis. A stent is present in the left M1 segment and appears patent, however there is suggestion of a linear nonocclusive filling defect in the midportion of the stent. Major left M2 branches are patent. There is mild right M1 stenosis, similar to prior. No evidence of right MCA major branch occlusion. The A1 segments are again not clearly visualized consistent with chronic occlusion or severe narrowing with diminished flow. Small collateral vessels are present in this region. There is left A2 reconstitution as before with moderate distal irregularity and attenuation. No aneurysm is identified. Posterior circulation: Intracranial vertebral arteries are patent to the basilar with the right being dominant. The left vertebral artery is small distal to the PICA. Right PICA is hypoplastic. SCA origins are patent. The basilar artery is patent with mild focal narrowing in its midportion. There are patent posterior communicating arteries, left larger than right. The right P1 segment is  hypoplastic. The left P1 segment is severely hypoplastic or absent. There is mild PCA irregularity bilaterally with similar appearance of mild left P2 segment stenoses. No aneurysm is identified. Venous sinuses: Patent. Anatomic variants: Fetal type origin of the left PCA. Review of the MIP images confirms the above findings CT CEREBRAL PERFUSION FINDINGS There is prolonged time to peak throughout the left MCA superior division, with more focal region of greater prolonged transit in the high left frontal lobe. Cerebral blood flow is diminished throughout this entire superior M2 region, though without diminished cerebral blood volume or cerebral blood flow less than 30% of the contralateral side to suggest acute core infarct. In fact, cerebral blood volume is actually mildly increased compared to the contralateral side. IMPRESSION: 1. Possible nonocclusive thrombus in the left MCA stent. Abnormally prolonged transit throughout the left MCA  superior division without evidence of acute core infarct or large vessel occlusion. 2. Chronically occluded or severely diseased A1 segments. 3. Mild right M1 MCA stenosis. Findings were discussed in person with Drs. Desmond Lope and Deveshwar on 07/28/2016 at 9:30 a.m. Electronically Signed   By: Sebastian Ache M.D.   On: 07/28/2016 10:19     STUDIES:  CT Head 10/6 >> no acute abnormalities, small left frontal lobe subcortical white matter infarct, minor chronic microvascular ischemic changes, L MCA stent.  CT Cerebral Perfusion 10/6 >> possible non-occlusive thrombus in the L MCA stent, chronically occluded or severely diseased A1 segments, mild R M1 MCA stenosis  CULTURES:   ANTIBIOTICS:   SIGNIFICANT EVENTS: 10/06  Admit with L MCA instent thrombus / CVA, to neuro IR, returned to ICU on vent   LINES/TUBES: ETT 10/6 >>   DISCUSSION: 50 y/o F with hx of PFO s/p repair, L MCA stenosis s/p stent on 10/2 admitted 10/6 with right sided weakness, slurred speech and difficulty swallowing.  To neuro IR for balloon angioplasty x3 for severe/near occlusive instent stenosis..    ASSESSMENT / PLAN:  PULMONARY A: Acute Respiratory Failure - in setting of CVA / altered mental status At Risk Aspiration  P:   PRVC 8 cc/kg  Wean PEEP / FiO2 for sats > 92% CXR / ABG post arrival to ICU  SBT in am  Hold extubation until sheath removed, pt has to life flat post removal   CARDIOVASCULAR A:  Hx HTN, HLD, PFO s/p Repair P:  Assess ECHO, carotid dopplers ICU monitoring of hemodynamics  Assess lipid panel in am  ASA QD Continue home nebivolol   RENAL A:   Elevated Sr Cr - appears to be at baseline, ~ 1.2  P:   Trend BMP / UOP  Replace electrolytes as indicated   GASTROINTESTINAL A:   Obesity  Hx GERD P:   NPO  OGT while intubated  Pepcid for SUP Will need formal swallow eval post extubation given CVA  HEMATOLOGIC A:   Hx Anemia  P:  Trend CBC  SCD's for DVT prophylaxis    INFECTIOUS A:   Post Neuro-IR  P:   Monitor fever curve / WBC   ENDOCRINE A:   DM    P:   SSI  Assess A1c  NEUROLOGIC A:   L MCA CVA - CT perfusion study with significant mismatch in the left MCA territory suggestive of salvageable penumbra (per Neurology)  Hx L MCA Stenosis s/p Stent 10/2 - on brilinita + ASA P:   RASS goal: -1 Neurology primary, defer to them for further imaging  Continue ASA  Frequent neuro  exams  Follow up MRI  Hold extubation until sheath removed as patient has to lie flat Heparin infusion post angioplasty per Neuro IR / Pharmacy   FAMILY  - Updates: No family at bedside 10/6  - Inter-disciplinary family meet or Palliative Care meeting due by: 10/13   Canary BrimBrandi Ollis, NP-C [facility] Pgr: [phone] or if no answer [phone] 07/28/2016, 10:40 AM  Attending Note:  I have examined patient, reviewed labs, studies and notes. I have discussed the case with B Ollis, and I agree with the data and plans as amended above. 50 yo woman with recent L MCA stent placement. She experienced facial weakness and went to angio on 10/6, was found to have near full in-stent occlusion of the stent. This was opened successfully. She returns to the ICU intubated and sedated. On my eval she is hemodynamically stable, unresponsive to voice on propofol. Clear lungs. Tolerating MV. We will plan to keep her sedated, assess her for SBT and extubation after sheath has been removed. Likely in the am.  Independent critical care time is 45 minutes.   Levy Pupaobert Jaivion Kingsley, MD, PhD 07/28/2016, 3:09 PM [facility] [phone] or if no answer [phone]

## 2016-07-28 NOTE — ED Notes (Signed)
Patient transported to CT 

## 2016-07-28 NOTE — Anesthesia Preprocedure Evaluation (Signed)
Anesthesia Evaluation  Patient identified by MRN, date of birth, ID band Patient awake    Reviewed: Allergy & Precautions, NPO status , Patient's Chart, lab work & pertinent test results  Airway Mallampati: II  TM Distance: >3 FB Neck ROM: Full    Dental  (+) Teeth Intact, Dental Advisory Given   Pulmonary asthma ,    breath sounds clear to auscultation       Cardiovascular hypertension, + Peripheral Vascular Disease   Rhythm:Regular Rate:Normal     Neuro/Psych  Headaches, PSYCHIATRIC DISORDERS Depression TIACVA    GI/Hepatic GERD  ,  Endo/Other  diabetes, Type 2  Renal/GU Renal disease     Musculoskeletal   Abdominal (+) + obese,   Peds  Hematology  (+) anemia ,   Anesthesia Other Findings   Reproductive/Obstetrics                             Anesthesia Physical  Anesthesia Plan  ASA: III  Anesthesia Plan: General   Post-op Pain Management:    Induction: Intravenous  Airway Management Planned: Oral ETT  Additional Equipment: Arterial line  Intra-op Plan:   Post-operative Plan: Extubation in OR  Informed Consent: I have reviewed the patients History and Physical, chart, labs and discussed the procedure including the risks, benefits and alternatives for the proposed anesthesia with the patient or authorized representative who has indicated his/her understanding and acceptance.   Dental advisory given  Plan Discussed with: CRNA  Anesthesia Plan Comments:         Anesthesia Quick Evaluation

## 2016-07-28 NOTE — ED Triage Notes (Signed)
Pt reports right arm tingling that started last night. Woke up this am with slurred speech and difficulty swallowing water. Right grip is weaker, no arm drift, slurred speech noted. Just had interventional procedure done for stent placement on Monday.

## 2016-07-28 NOTE — ED Provider Notes (Signed)
[facility]-EMERGENCY DEPT Provider Note   CSN: [id number] Arrival date & time: 07/28/16  0716  History   Chief Complaint Chief Complaint  Patient presents with  . Stroke Symptoms    HPI Patricia [name] is a 50 y.o. female.  HPI  50 y.o. female with a hx of CVA, DM, HTN, presents to the Emergency Department today complaining of right arm numbness last night around 2100 as well as slurred speech this morning at 0530. Notes hx of same with right arm numbness. Recent L MCA angioplasty with stent placement on 07-24-16 due to 95% stenosis. On Brillinta and ASA for stent. Did not take ASA this morning. Denies pain currently. No CP/SOB/ABD pain. No N/V/D. No fevers. No other symptoms noted.   Past Medical History:  Diagnosis Date  . Anemia    in the past  . Asthma    " a long time ago"  . Bursitis   . Depression    13 years ago -   . Diabetes mellitus without complication (HCC)    type  2  . GERD (gastroesophageal reflux disease)    occ. acid reflux  . Headache    migraines  . Hypertension   . Kidney stones   . PFO (patent foramen ovale)    hx of PFO with surgery done as an adult  . Rotator cuff tear    right  . Stroke [facility]) 2009   lasdt stroke July 2015  . Syncope and collapse     Patient Active Problem List   Diagnosis Date Noted  . Middle cerebral artery stenosis, left 07/24/2016  . Headache 03/23/2016  . Type 2 diabetes mellitus with complication, with long-term current use of insulin (HCC) 09/08/2015  . Hyperglycemia   . TIA (transient ischemic attack)   . AKI (acute kidney injury) (HCC) 02/20/2015  . ARF (acute renal failure) (HCC) 02/20/2015  . Rotator cuff tear 02/04/2015  . Occipital neuralgia 08/27/2014  . Type 2 diabetes mellitus with complication (HCC) 08/27/2014  . PFO (patent foramen ovale) 06/17/2014  . HLD (hyperlipidemia) 06/17/2014  . Morbid obesity (HCC) 06/17/2014  . Acute CVA (cerebrovascular accident) (HCC) 05/22/2014  . Non-compliance  05/22/2014  . History of cardioembolic stroke 05/22/2014  . Status post device closure of ASD 05/22/2014  . HTN (hypertension), benign 05/22/2014  . DM type 2 (diabetes mellitus, type 2) (HCC) 05/22/2014    Past Surgical History:  Procedure Laterality Date  . ABDOMINAL HYSTERECTOMY    . BREAST REDUCTION SURGERY    . CARDIAC CATHETERIZATION    . CARDIAC SURGERY  Closed hole in heart in 2009   Amplatzer Septal Occluder Ref [id number] (not listed in op notes, document scanned under MRI with written op notes  . HYSTEROTOMY    . IR GENERIC HISTORICAL  06/23/2016   IR ANGIO INTRA EXTRACRAN SEL COM CAROTID INNOMINATE BILAT MOD SED 06/23/2016 Julieanne Cotton, MD [facility]  . IR GENERIC HISTORICAL  06/23/2016   IR ANGIO VERTEBRAL SEL VERTEBRAL BILAT MOD SED 06/23/2016 Julieanne Cotton, MD [facility]  . IR GENERIC HISTORICAL  06/29/2016   IR RADIOLOGIST EVAL & MGMT 06/29/2016 [facility]  . IR GENERIC HISTORICAL  07/24/2016   IR INTRA CRAN STENT 07/24/2016 Julieanne Cotton, MD [facility]  . RADIOLOGY WITH ANESTHESIA [name] 07/24/2016   Procedure: Stenting;  Surgeon: Julieanne Cotton, MD;  Location: [facility] OR;  Service: Radiology;  Laterality: [name];  . SHOULDER ARTHROSCOPY WITH OPEN ROTATOR CUFF REPAIR AND DISTAL CLAVICLE ACROMINECTOMY Right 02/04/2015  Procedure: SHOULDER ARTHROSCOPY WITH SUBACROMIAL DECOMPRESSION AND OPEN ROTATOR CUFF REPAIR,;  Surgeon: Cammy Copa, MD;  Location: [facility] OR;  Service: Orthopedics;  Laterality: Right;  RIGHT SHOULDER DOA,SAD,MINI-OPEN ROTATOR CUFF TEAR REPAIR.    OB History    No data available       Home Medications    Prior to Admission medications   Medication Sig Start Date End Date Taking? Authorizing Provider  aspirin EC 81 MG tablet Take 81 mg by mouth at bedtime.    Historical Provider, MD  chlorthalidone (HYGROTON) 25 MG tablet Take 25 mg by mouth at bedtime.  09/03/15   Historical Provider, MD  diphenhydramine-acetaminophen (TYLENOL PM) 25-500 MG  TABS tablet Take 2 tablets by mouth at bedtime as needed (for pain or sleep).     Historical Provider, MD  Empagliflozin-Metformin HCl (SYNJARDY) 12.02-999 MG TABS Take 1 tablet by mouth 2 (two) times daily.     Historical Provider, MD  fluconazole (DIFLUCAN) 150 MG tablet Take 150 mg by mouth daily as needed (for yeast infections).    Historical Provider, MD  gabapentin (NEURONTIN) 250 MG/5ML solution Take 3 mLs (150 mg total) by mouth 3 (three) times daily. Patient taking differently: Take 150 mg by mouth at bedtime.  06/07/16   Marvel Plan, MD  lamoTRIgine (LAMICTAL) 25 MG tablet 25mg  Qhs x 2 wks, then 25mg  bid x 2 wks, then 25mg  am/50mg  pm x 2 wks, then 50mg  bid x 2 wks, then 50mg  am/100mg  pm x 2 wks, then 100mg  bid Patient taking differently: Take 100 mg by mouth 2 (two) times daily.  05/15/16   Marvel Plan, MD  Nebivolol HCl (BYSTOLIC) 20 MG TABS Take 20 mg by mouth every evening.     Historical Provider, MD  oxyCODONE-acetaminophen (PERCOCET) 10-325 MG tablet Take 1 tablet by mouth every 4 (four) hours as needed for pain.    Historical Provider, MD  pravastatin (PRAVACHOL) 40 MG tablet Take 40 mg by mouth at bedtime.    Historical Provider, MD  promethazine (PHENERGAN) 25 MG tablet Take 25 mg by mouth 3 (three) times daily as needed for nausea or vomiting.     Historical Provider, MD  SOLIQUA 100-33 UNT-MCG/ML SOPN INJECT 32 UNITS UNDER THE SKIN DAILY in the evening AS NEEDED FOR BLOOD SUGAR 02/25/16   Historical Provider, MD  ticagrelor (BRILINTA) 90 MG TABS tablet Take 45 mg by mouth 2 (two) times daily.     Historical Provider, MD  tiZANidine (ZANAFLEX) 4 MG tablet Take 4 mg by mouth every 8 (eight) hours as needed (migraines).     Historical Provider, MD    Family History Family History  Problem Relation Age of Onset  . Hypertension Mother   . Hypertension Father   . Cancer Maternal Aunt   . Cancer Maternal Uncle     Social History Social History  Substance Use Topics  . Smoking  status: Never Smoker  . Smokeless tobacco: Never Used  . Alcohol use 0.0 oz/week     Comment: occasional     Allergies   Imitrex [sumatriptan]; Plavix [clopidogrel bisulfate]; and Lipitor [atorvastatin]   Review of Systems Review of Systems ROS reviewed and all are negative for acute change except as noted in the HPI.  Physical Exam Updated Vital Signs BP (!) 171/113 (BP Location: Left Arm)   Pulse 75   Temp 98.4 F (36.9 C) (Oral)   Resp 20   Ht 5' 5.5" (1.664 m)   Wt 99.5 kg   SpO2  100%   BMI 35.94 kg/m   Physical Exam  Constitutional: She is oriented to person, place, and time. Vital signs are normal. She appears well-developed and well-nourished.  HENT:  Head: Normocephalic.  Right Ear: Hearing normal.  Left Ear: Hearing normal.  Eyes: Conjunctivae and EOM are normal. Pupils are equal, round, and reactive to light.  Neck: Normal range of motion. Neck supple.  Cardiovascular: Normal rate, regular rhythm, normal heart sounds and intact distal pulses.   Pulmonary/Chest: Effort normal and breath sounds normal. No respiratory distress. She has no wheezes. She has no rales. She exhibits no tenderness.  Musculoskeletal: Normal range of motion.  Neurological: She is alert and oriented to person, place, and time. She has normal strength. No sensory deficit.  Neg Pronator Drift Slurred Speech noted. Right Facial numbness with droop. EOM intact. No nystagmus.  BUE Motor/sensatory intact BLE motor/sensatory intact  Skin: Skin is warm and dry.  Psychiatric: She has a normal mood and affect. Her speech is normal and behavior is normal. Thought content normal.  Nursing note and vitals reviewed.  ED Treatments / Results  Labs (all labs ordered are listed, but only abnormal results are displayed) Labs Reviewed  COMPREHENSIVE METABOLIC PANEL - Abnormal; Notable for the following:       Result Value   Glucose, Bld 174 (*)    Creatinine, Ser 1.20 (*)    Total Bilirubin 0.2  (*)    GFR calc non Af Amer 52 (*)    All other components within normal limits  CBG MONITORING, ED - Abnormal; Notable for the following:    Glucose-Capillary 151 (*)    All other components within normal limits  PROTIME-INR  APTT  CBC  DIFFERENTIAL  PLATELET INHIBITION [name]  I-STAT TROPOININ, ED    EKG  EKG Interpretation  Date/Time:  Friday July 28 2016 07:20:46 EDT Ventricular Rate:  78 PR Interval:  182 QRS Duration: 88 QT Interval:  376 QTC Calculation: 428 R Axis:   -11 Text Interpretation:  Normal sinus rhythm Possible Anterior infarct , age undetermined Abnormal ECG No significant change since last tracing Confirmed by KNOTT MD, DANIEL [phone]([id number]) on 07/28/2016 8:06:22 AM       Radiology Ct Head Wo Contrast  Result Date: 07/28/2016 CLINICAL DATA:  Pt had right arm weakness and right sided facial numbness last night. Pt woke up with slurred speech this AM. Pt had stent placed on Monday. EXAM: CT HEAD WITHOUT CONTRAST TECHNIQUE: Contiguous axial images were obtained from the base of the skull through the vertex without intravenous contrast. COMPARISON:  08/13/2015 FINDINGS: Brain: The ventricles are normal in size and configuration. There are no parenchymal masses or mass effect. Focal area of hypoattenuation in the subcortical white matter of the left frontal lobe, consistent with an old infarct, is stable. There is minor periventricular white matter hypoattenuation consistent with chronic microvascular ischemic change, also stable. There is no evidence of a recent infarct. There are no extra-axial masses or abnormal fluid collections. There is no intracranial hemorrhage. Vascular: Left middle cerebral artery stent is new since the prior study. Vessels of the skullbase are otherwise unremarkable. Skull: Normal. Negative for fracture or focal lesion. Sinuses/Orbits: No acute finding. Other: None IMPRESSION: 1. No acute intracranial abnormalities. 2. Small left frontal lobe  subcortical white matter infarct. Minor chronic microvascular ischemic change. 3. Left middle cerebral artery stent new since the prior study. No other change. Electronically Signed   By: Amie Portlandavid  Ormond M.D.   On: 07/28/2016  08:35    Procedures Procedures (including critical care time) CRITICAL CARE Performed by: Eston Esters  Total critical care time: 35 minutes  Critical care time was exclusive of separately billable procedures and treating other patients.  Critical care was necessary to treat or prevent imminent or life-threatening deterioration.  Critical care was time spent personally by me on the following activities: development of treatment plan with patient and/or surrogate as well as nursing, discussions with consultants, evaluation of patient's response to treatment, examination of patient, obtaining history from patient or surrogate, ordering and performing treatments and interventions, ordering and review of laboratory studies, ordering and review of radiographic studies, pulse oximetry and re-evaluation of patient's condition.   Medications Ordered in ED Medications - No data to display   Initial Impression / Assessment and Plan / ED Course  I have reviewed the triage vital signs and the nursing notes.  Pertinent labs & imaging results that were available during my care of the patient were reviewed by me and considered in my medical decision making (see chart for details).  Clinical Course    Final Clinical Impressions(s) / ED Diagnoses  I have reviewed and evaluated the relevant laboratory values I have reviewed and evaluated the relevant imaging studies.  I have interpreted the relevant EKG. I have reviewed the relevant previous healthcare records.I have reviewed EMS Documentation.I obtained HPI from historian. Patient discussed with supervising physician  ED Course:  Assessment: Pt is a 50yF with hx CVA who presents with slurred speech and right arm numbness. Hx  recent L MCA Angioplasty/Stent due to 95% stenosis. On Brillinta and ASA. On exam, pt in NAD. Nontoxic/nonseptic appearing. VSS. Afebrile. Lungs CTA. Heart RRR. Slurred speech noted with right facial droop. Motor/sensory exam unremarkable for BUE/BLE. Neg pronator drift. Consulted Neurology (Dr. Desmond Lope) after physical exam. Will see in ED. Labs unremarkable. CT Head shows left subcortical white matter infarct. Code Stroke initiated. Considered tPA, but pt outside window. Given ASA in ED. Will admit.    Disposition/Plan:  Admit to Neuro Pt acknowledges and agrees with plan  Supervising Physician Lyndal Pulley, MD   Final diagnoses:  Stroke (cerebrum) [facility])  Cerebrovascular accident (CVA), unspecified mechanism [facility])    New Prescriptions New Prescriptions   No medications on file       Audry Pili, PA-C 07/28/16 1610    Lyndal Pulley, MD 07/29/16 1042

## 2016-07-28 NOTE — ED Notes (Signed)
PA made aware of pt. Failing stroke swallow screen. Will hold asa at this time. Pt. Reports she took 325 mg ASA last night.

## 2016-07-28 NOTE — Procedures (Signed)
S/P lt CCA arteriogram followed by balloon angioplasty x 3 using 15mm x2 ,and x 1 2.25 mm x 15 mm Gateway balloon cathters for severe near occlusive instent stenosis.

## 2016-07-28 NOTE — Anesthesia Postprocedure Evaluation (Signed)
Anesthesia Post Note  Patient: Patricia [name]  Procedure(s) Performed: Procedure(s) (LRB): RADIOLOGY WITH ANESTHESIA ([name])  Patient location during evaluation: ICU Anesthesia Type: General Level of consciousness: patient remains intubated per anesthesia plan and sedated Pain management: pain level controlled Vital Signs Assessment: post-procedure vital signs reviewed and stable Respiratory status: patient remains intubated per anesthesia plan Cardiovascular status: stable Anesthetic complications: no    Last Vitals:  Vitals:   07/28/16 0945 07/28/16 1400  BP: 142/80 138/87  Pulse: 67 73  Resp: 15 15  Temp:      Last Pain:  Vitals:   07/28/16 0723  TempSrc: Oral                 Patricia [name]

## 2016-07-28 NOTE — Code Documentation (Addendum)
50[id number]  Patient underwent stent assisted angioplasty of symptomatic left MCA stenosis on 07/24/2016 and was discharged home on 07/25/2016.  Patient reports drooling last night at 2130 and woke up this morning with right sided decreased sensation, right facial droop and slurred speech.  Patient presented to the ED where a code stroke was activated.  Stroke team to the bedside.  Patient in CT, CTA and CTP completed.  Patient with right facial droop and right sided decreased sensation on exam per ED RN.  Patient is outside the window for treatment with tPA.  Bedside handoff with ED RN Redmond BasemanHayden.   Patient to go to IR for catheter angiogram.  Redmond BasemanHayden, ED RN, updated on plan of care.

## 2016-07-28 NOTE — Anesthesia Procedure Notes (Signed)
Procedure Name: Intubation Date/Time: 07/28/2016 10:12 AM Performed by: Carmela RimaMARTINELLI, Ames Hoban F Pre-anesthesia Checklist: Timeout performed, Patient being monitored, Suction available, Emergency Drugs available and Patient identified Patient Re-evaluated:Patient Re-evaluated prior to inductionOxygen Delivery Method: Circle system utilized Preoxygenation: Pre-oxygenation with 100% oxygen Intubation Type: IV induction Ventilation: Mask ventilation without difficulty Laryngoscope Size: Mac and 3 Grade View: Grade I Tube type: Subglottic suction tube Tube size: 7.0 mm Number of attempts: 1 Placement Confirmation: breath sounds checked- equal and bilateral,  positive ETCO2 and ETT inserted through vocal cords under direct vision Secured at: 22 cm Tube secured with: Tape Dental Injury: Teeth and Oropharynx as per pre-operative assessment

## 2016-07-28 NOTE — Progress Notes (Signed)
WUA preformed, held propofol from 1715-1730, pt opened eyes, followed commands in all four extremities. BP getting high, pt trying to move, had to restart propofol to keep sheath precautions and keep ETT in.

## 2016-07-28 NOTE — Progress Notes (Signed)
Pt received from CRNA, hypertensive, needs orders, Dr Molli KnockYacoub paged.

## 2016-07-28 NOTE — Progress Notes (Signed)
ANTICOAGULATION CONSULT NOTE - Initial Consult  Pharmacy Consult for heparin Indication: cerebral angiogram  Allergies  Allergen Reactions  . Imitrex [Sumatriptan] Anaphylaxis  . Plavix [Clopidogrel Bisulfate] Hives  . Lipitor [Atorvastatin] Rash    Patient Measurements: Height: 5' 5.5" (166.4 cm) Weight: 219 lb 4.8 oz (99.5 kg) IBW/kg (Calculated) : 58.15 Heparin Dosing Weight: 80kg  Vital Signs: Temp: 98.4 F (36.9 C) (10/06 0723) Temp Source: Oral (10/06 0723) BP: 142/80 (10/06 0945) Pulse Rate: 67 (10/06 0945)  Labs:  Recent Labs  07/28/16 0732  HGB 12.5  HCT 39.6  PLT 248  APTT 30  LABPROT 12.1  INR 0.90  CREATININE 1.20*    Estimated Creatinine Clearance: 66.9 mL/min (by C-G formula based on SCr of 1.2 mg/dL (H)).   Medical History: Past Medical History:  Diagnosis Date  . Anemia    in the past  . Asthma    " a long time ago"  . Bursitis   . Depression    13 years ago -   . Diabetes mellitus without complication (HCC)    type  2  . GERD (gastroesophageal reflux disease)    occ. acid reflux  . Headache    migraines  . Hypertension   . Kidney stones   . PFO (patent foramen ovale)    hx of PFO with surgery done as an adult  . Rotator cuff tear    right  . Stroke [facility](HCC) 2009   lasdt stroke July 2015  . Syncope and collapse     Medications:  Infusions:  . sodium chloride    . sodium chloride    . heparin    . niCARDipine 5 mg/hr (07/28/16 1341)    Assessment: 50 yof s/p cerebral angiogram to start IV heparin. Pt is not on blood thinners at home. Baseline CBC is WNL.  Goal of Therapy:  Heparin level 0.1-0.25 units/ml Monitor platelets by anticoagulation protocol: Yes   Plan:  - Heparin gtt 650 units/hr - Check an 8 hr heparin level *Heparin off at 0700 tomorrow  Danielle Lento, Drake Leachachel Lynn 07/28/2016,2:01 PM

## 2016-07-28 NOTE — Plan of Care (Signed)
Problem: Education: Goal: Knowledge of disease or condition will improve Outcome: Not Applicable Date Met: 46/56/81 Discussed plan of care with two of pt's daughters over phone

## 2016-07-28 NOTE — Transfer of Care (Signed)
Immediate Anesthesia Transfer of Care Note  Patient: Patricia [name]  Procedure(s) Performed: Procedure(s): RADIOLOGY WITH ANESTHESIA ([name])  Patient Location: ICU  Anesthesia Type:General  Level of Consciousness: Patient remains intubated per anesthesia plan  Airway & Oxygen Therapy: Patient remains intubated per anesthesia plan and Patient placed on Ventilator (see vital sign flow sheet for setting)  Post-op Assessment: Report given to RN  Post vital signs: Reviewed and stable  Last Vitals:  Vitals:   07/28/16 0930 07/28/16 0945  BP: 146/80 142/80  Pulse: 68 67  Resp: 16 15  Temp:      Last Pain:  Vitals:   07/28/16 0723  TempSrc: Oral         Complications: No apparent anesthesia complications

## 2016-07-29 ENCOUNTER — Inpatient Hospital Stay (HOSPITAL_COMMUNITY): Payer: BLUE CROSS/BLUE SHIELD

## 2016-07-29 ENCOUNTER — Encounter (HOSPITAL_COMMUNITY): Payer: Self-pay | Admitting: Radiology

## 2016-07-29 DIAGNOSIS — I63312 Cerebral infarction due to thrombosis of left middle cerebral artery: Secondary | ICD-10-CM

## 2016-07-29 LAB — CBC WITH DIFFERENTIAL/PLATELET
BASOS ABS: 0 10*3/uL (ref 0.0–0.1)
BASOS PCT: 0 %
EOS ABS: 0.1 10*3/uL (ref 0.0–0.7)
Eosinophils Relative: 2 %
HCT: 37.8 % (ref 36.0–46.0)
HEMOGLOBIN: 12.3 g/dL (ref 12.0–15.0)
LYMPHS ABS: 2.5 10*3/uL (ref 0.7–4.0)
Lymphocytes Relative: 28 %
MCH: 28.1 pg (ref 26.0–34.0)
MCHC: 32.5 g/dL (ref 30.0–36.0)
MCV: 86.3 fL (ref 78.0–100.0)
Monocytes Absolute: 0.7 10*3/uL (ref 0.1–1.0)
Monocytes Relative: 7 %
NEUTROS PCT: 63 %
Neutro Abs: 5.6 10*3/uL (ref 1.7–7.7)
Platelets: 259 10*3/uL (ref 150–400)
RBC: 4.38 MIL/uL (ref 3.87–5.11)
RDW: 13 % (ref 11.5–15.5)
WBC: 8.9 10*3/uL (ref 4.0–10.5)

## 2016-07-29 LAB — LIPID PANEL
CHOL/HDL RATIO: 3.1 ratio
Cholesterol: 121 mg/dL (ref 0–200)
HDL: 39 mg/dL — AB (ref >40)
LDL CALC: 52 mg/dL (ref 0–99)
Triglycerides: 149 mg/dL (ref <150)
VLDL: 30 mg/dL (ref 0–40)

## 2016-07-29 LAB — COMPREHENSIVE METABOLIC PANEL
ALBUMIN: 3.4 g/dL — AB (ref 3.5–5.0)
ALT: 23 U/L (ref 14–54)
ANION GAP: 10 (ref 5–15)
AST: 25 U/L (ref 15–41)
Alkaline Phosphatase: 114 U/L (ref 38–126)
BUN: 8 mg/dL (ref 6–20)
CO2: 22 mmol/L (ref 22–32)
Calcium: 9.2 mg/dL (ref 8.9–10.3)
Chloride: 109 mmol/L (ref 101–111)
Creatinine, Ser: 0.96 mg/dL (ref 0.44–1.00)
GFR calc Af Amer: >60 mL/min (ref >60)
GFR calc non Af Amer: >60 mL/min (ref >60)
GLUCOSE: 99 mg/dL (ref 65–99)
POTASSIUM: 3.8 mmol/L (ref 3.5–5.1)
SODIUM: 141 mmol/L (ref 135–145)
Total Bilirubin: 0.4 mg/dL (ref 0.3–1.2)
Total Protein: 6.6 g/dL (ref 6.5–8.1)

## 2016-07-29 LAB — GLUCOSE, CAPILLARY
GLUCOSE-CAPILLARY: 92 mg/dL (ref 65–99)
GLUCOSE-CAPILLARY: 84 mg/dL (ref 65–99)
GLUCOSE-CAPILLARY: 88 mg/dL (ref 65–99)
GLUCOSE-CAPILLARY: 76 mg/dL (ref 65–99)
Glucose-Capillary: 78 mg/dL (ref 65–99)
Glucose-Capillary: 82 mg/dL (ref 65–99)

## 2016-07-29 LAB — PLATELET INHIBITION P2Y12: Platelet Function  P2Y12: 125 [PRU] — ABNORMAL LOW (ref 194–418)

## 2016-07-29 MED ORDER — FENTANYL CITRATE (PF) 100 MCG/2ML IJ SOLN: 12.5000 ug | INTRAMUSCULAR | Status: DC | PRN

## 2016-07-29 MED ORDER — SODIUM CHLORIDE 0.9 % IV SOLN
1000.0000 mg | Freq: Once | INTRAVENOUS | Status: AC
  Administered 2016-07-29: 1000 mg via INTRAVENOUS
  Filled 2016-07-29: qty 10

## 2016-07-29 MED ORDER — LEVETIRACETAM 500 MG/5ML IV SOLN
500.0000 mg | Freq: Two times a day (BID) | INTRAVENOUS | Status: DC
  Administered 2016-07-30: 500 mg via INTRAVENOUS
  Filled 2016-07-29 (×2): qty 5

## 2016-07-29 MED ORDER — HEPARIN (PORCINE) IN NACL 100-0.45 UNIT/ML-% IJ SOLN
500.0000 [IU]/h | INTRAMUSCULAR | Status: DC
  Administered 2016-07-29: 500 [IU]/h via INTRAVENOUS
  Filled 2016-07-29: qty 250

## 2016-07-29 NOTE — Progress Notes (Signed)
ANTICOAGULATION CONSULT NOTE - Follow Up Consult  Pharmacy Consult for heparin Indication: s/p cerebral angiogram  Labs:  Recent Labs  07/28/16 0732 07/28/16 2230  HGB 12.5  --   HCT 39.6  --   PLT 248  --   APTT 30  --   LABPROT 12.1  --   INR 0.90  --   HEPARINUNFRC  --  0.33  CREATININE 1.20*  --     Assessment: 50yo female above low goal on heparin with initial dosing for cerebral angiogram.  Goal of Therapy:  Heparin level 0.1-0.25 units/ml   Plan:  Will decrease heparin gtt by 1-2 units/kg/hr to 500 units/hr until off at 0700.  Patricia [name], PharmD, BCPS  07/29/2016,12:14 AM

## 2016-07-29 NOTE — Procedures (Signed)
Extubation Procedure Note  Patient Details:   Name: Porfirio OarDutchess D Sprowl DOB: 01-08-1966 MRN: [id number]   Airway Documentation:     Evaluation  O2 sats: stable throughout Complications: No apparent complications Patient did tolerate procedure well. Bilateral Breath Sounds: Clear   Yes   Pt extubated to 3L [place] per MD order. Pt stable throughout with no complications. Pt has a weak non-productive cough and is able to speak but speech is slurred. CCM NP made aware. Pt encouraged to use [place] to clear secretions. RT will closely monitor pt.   Carolan ShiverKelley, Lore Polka M 07/29/2016, 11:10 AM

## 2016-07-29 NOTE — Progress Notes (Signed)
Right 497fr sheath removal, no hematoma present before sheath pull, 277fr Exoseal deployed and held manual pressure for 15 minutes. No complications. Tegaderm and pressure dressing applied.  Verified site with Lestine MountJamie RN.  Lynann BeaverJames Javonne Dorko RT R VI Juliet King-Cushman RT R

## 2016-07-29 NOTE — Evaluation (Signed)
Physical Therapy Evaluation Patient Details Name: Patricia OarDutchess D [name] MRN: [id number] DOB: 1965/11/15 Today's Date: 07/29/2016   History of Present Illness  50 y/o F with PMH of anemia, depression, headaches, GERD, DM, HTN, renal stones, syncope, PFO, CVA (2009) s/p repair who [facility] on am of 10/6 with reports of right sided weakness, slurred speech & difficulty swallowing.  The patient was seen on Monday 07/24/16 for left MCA angioplasty with stent placement due to 95% stenosis.  She underwent a left CCA arteriogram followed by balloon angioplasty x3 for severe/near occlusive instent stenosis.  Pt returned to ICU on mechanical ventilation.  She was extubated 07/29/16.  Clinical Impression  Patient presents with decreased independence with mobility due to deficits listed in PT problem list.  She will benefit from skilled PT in the acute setting to allow return home with family support.  Recommend follow up outpatient PT for high level balance and R LE strength.  Likely to need interdisciplinary therapies in the outpatient setting due to dysarthria and R UE weakness.     Follow Up Recommendations Outpatient PT    Equipment Recommendations  None recommended by PT    Recommendations for Other Services       Precautions / Restrictions Precautions Precautions: Fall      Mobility  Bed Mobility Overal bed mobility: Needs Assistance Bed Mobility: Supine to Sit     Supine to sit: Supervision;HOB elevated     General bed mobility comments: assist for lines, pt would not let me help her  Transfers Overall transfer level: Needs assistance Equipment used: None Transfers: Sit to/from Stand Sit to Stand: Min guard         General transfer comment: for safety  Ambulation/Gait Ambulation/Gait assistance: Min guard;Supervision Ambulation Distance (Feet): 150 Feet Assistive device: None Gait Pattern/deviations: Step-through pattern;Step-to pattern;Wide base of  support;Shuffle;Decreased stride length     General Gait Details: demonstrated shuffling pattern with decreased step length and wide BOS, but no LOB or buckling noted  Stairs            Wheelchair Mobility    Modified Rankin (Stroke Patients Only) Modified Rankin (Stroke Patients Only) Pre-Morbid Rankin Score: No symptoms Modified Rankin: Moderately severe disability     Balance Overall balance assessment: Needs assistance   Sitting balance-Leahy Scale: Good       Standing balance-Leahy Scale: Good Standing balance comment: Some imbalance evident, though able to walk without support                             Pertinent Vitals/Pain Pain Assessment: No/denies pain    Home Living Family/patient expects to be discharged to:: Private residence Living Arrangements: Children Available Help at Discharge: Family;Available PRN/intermittently Type of Home: House Home Access: Stairs to enter Entrance Stairs-Rails: None Entrance Stairs-Number of Steps: 3 Home Layout: Able to live on main level with bedroom/bathroom Home Equipment: None      Prior Function Level of Independence: Independent         Comments: works at [employer]     Hand Dominance   Dominant Hand: Right    Extremity/Trunk Assessment   Upper Extremity Assessment: RUE deficits/detail RUE Deficits / Details: AROM WFL, strength shoulder elevation 4-/5, elbow flexion 4/5; reports intact sensation         Lower Extremity Assessment: RLE deficits/detail RLE Deficits / Details: AROM WFL, strength hip flexion 4-/5, knee extension 4/5. ankle DF 4/5; overall strength similar to  L, but some muscle impersistance noted; sensation intact       Communication   Communication: Expressive difficulties (dysarthria)  Cognition Arousal/Alertness: Awake/alert Behavior During Therapy: WFL for tasks assessed/performed Overall Cognitive Status: Impaired/Different from baseline Area of Impairment:  Orientation Orientation Level: Time (Thought it was Thursday )                  General Comments General comments (skin integrity, edema, etc.): States son works nights, but sister can stay if needed    Exercises     Assessment/Plan    PT Assessment Patient needs continued PT services  PT Problem List Decreased mobility;Decreased safety awareness;Decreased strength          PT Treatment Interventions DME instruction;Therapeutic activities;Therapeutic exercise;Gait training;Stair training;Balance training;Functional mobility training;Patient/family education    PT Goals (Current goals can be found in the Care Plan section)  Acute Rehab PT Goals Patient Stated Goal: To return to independent PT Goal Formulation: With patient Time For Goal Achievement: 08/01/16 Potential to Achieve Goals: Good    Frequency Min 4X/week   Barriers to discharge        Co-evaluation               End of Session Equipment Utilized During Treatment: Gait belt Activity Tolerance: Patient tolerated treatment well Patient left: in bed;with nursing/sitter in room           Time: 1456-1530 PT Time Calculation (min) (ACUTE ONLY): 34 min   Charges:   PT Evaluation $PT Eval Moderate Complexity: 1 Procedure PT Treatments $Gait Training: 8-22 mins   PT G CodesElray Mcgregor 08-13-16, 5:16 PM  Sheran Lawless, PT [phone] 08/13/2016

## 2016-07-29 NOTE — Progress Notes (Signed)
PULMONARY / CRITICAL CARE MEDICINE   Name: THI KLICH MRN: [id number] DOB: 01/14/1966    ADMISSION DATE:  07/28/2016 CONSULTATION DATE:  07/28/16  REFERRING MD:  Dr. Desmond Lope / Stroke MD  CHIEF COMPLAINT:  Slurred speech, right sided weakness & difficulty swallowing  HISTORY OF PRESENT ILLNESS:   50 y/o F with PMH of anemia, depression, headaches, GERD, DM, HTN, renal stones, syncope, PFO, CVA (2009) s/p repair who presented to [facility] on am of 10/6 with reports of right sided weakness, slurred speech & difficulty swallowing.   Of note, the patient was seen on Monday 07/24/16 for left MCA angioplasty with stent placement due to 95% stenosis.  At baseline, she is on brillinta and ASA.    Neurology was consulted on admission for CODE STROKE.  Clinically, she was noted to have right sided facial droop and decreased sensation on the right side.  She was deemed outside the window for tPA.  She was evaluated with a CT of the head which showed no acute abnormalities, small left frontal lobe subcortical white matter infarct, minor chronic microvascular ischemic changes, L MCA stent.  CTA head pending.  She was evaluated by neuro-IR and taken for intervention.  She underwent a left CCA arteriogram followed by balloon angioplasty x3 for severe/near occlusive instent stenosis.  Pt returned to ICU on mechanical ventilation.   PCCM consulted for ICU assistance / vent management.    SUBJECTIVE:  Sheath pulled this am.  Tol PS 5/5.  Awake, following commands. C/o mild headache.    VITAL SIGNS: BP (!) 128/104   Pulse 78   Temp 97.8 F (36.6 C) (Oral)   Resp 19   Ht 5' 5.5" (1.664 m)   Wt 99.5 kg (219 lb 4.8 oz)   SpO2 100%   BMI 35.94 kg/m   HEMODYNAMICS:    VENTILATOR SETTINGS: Vent Mode: PRVC FiO2 (%):  [30 %-100 %] 30 % Set Rate:  [16 bmp] 16 bmp Vt Set:  [550 mL] 550 mL PEEP:  [5 cmH20] 5 cmH20 Plateau Pressure:  [18 cmH20-19 cmH20] 18 cmH20  INTAKE / OUTPUT: I/O last 3 completed  shifts: In: 3312.9 [I.V.:3212.9; IV Piggyback:100] Out: 4750 [Urine:4600; Blood:150]  PHYSICAL EXAMINATION: General:  Morbidly obese female in NAD on vent  Neuro:  Awake, alert, follows commands, nods appropriately, MAE  HEENT:  ETT in place, MM pink/moist, no jvd Cardiovascular:  s1s2 rrr, no m/r/g  Lungs:  resps Even/non-labored on PS 5/5 with Vt , lungs bilaterally clear  Abdomen:  Obese/soft, bsx4 active Musculoskeletal:  No acute deformities  Skin:  Warm/dry, no edema   LABS:  BMET  Recent Labs Lab 07/25/16 0107 07/28/16 0732 07/29/16 0615  NA 137 138 141  K 4.3 4.1 3.8  CL 108 103 109  CO2 26 25 22   BUN 11 13 8   CREATININE 1.09* 1.20* 0.96  GLUCOSE 91 174* 99    Electrolytes  Recent Labs Lab 07/25/16 0107 07/28/16 0732 07/29/16 0615  CALCIUM 8.9 9.6 9.2    CBC  Recent Labs Lab 07/25/16 0107 07/28/16 0732 07/29/16 0615  WBC 6.7 8.6 8.9  HGB 11.4* 12.5 12.3  HCT 36.6 39.6 37.8  PLT 204 248 259    Coag's  Recent Labs Lab 07/24/16 0641 07/28/16 0732  APTT 27 30  INR 0.97 0.90    Sepsis Markers No results for input(s): LATICACIDVEN, PROCALCITON, O2SATVEN in the last 168 hours.  ABG  Recent Labs Lab 07/28/16 1500  PHART 7.444  PCO2ART 32.9  PO2ART 417*    Liver Enzymes  Recent Labs Lab 07/28/16 0732 07/29/16 0615  AST 35 25  ALT 32 23  ALKPHOS 114 114  BILITOT 0.2* 0.4  ALBUMIN 3.8 3.4*    Cardiac Enzymes No results for input(s): TROPONINI, PROBNP in the last 168 hours.  Glucose  Recent Labs Lab 07/28/16 0825 07/28/16 1624 07/28/16 1948 07/28/16 2339 07/29/16 0319 07/29/16 0831  GLUCAP 151* 105* 95 102* 92 78    Imaging Dg Chest Port 1 View  Result Date: 07/29/2016 CLINICAL DATA:  Ventilator support.  Stroke. EXAM: PORTABLE CHEST 1 VIEW COMPARISON:  07/28/2016 FINDINGS: Endotracheal tube has its tip 5 cm above the carina. Nasogastric tube enters stomach. Mild atelectasis in both lower lobes is  unchanged. No worsening or new findings. IMPRESSION: Endotracheal tube and nasogastric tube well position. Mild atelectasis in the lower lungs persists. Electronically Signed   By: Paulina Fusi M.D.   On: 07/29/2016 07:22   Portable Chest Xray  Result Date: 07/28/2016 CLINICAL DATA:  Intubation. EXAM: PORTABLE CHEST 1 VIEW COMPARISON:  02/20/2015 FINDINGS: Endotracheal tube terminates at the clavicular heads, approximately 5 cm above the carina. Enteric tube courses into the left upper abdomen with tip not imaged. Cardiac silhouette remains mildly enlarged. Lung volumes are low with elevation of the right hemidiaphragm. No airspace consolidation, edema, pleural effusion, or pneumothorax is identified. IMPRESSION: Endotracheal tube as above.  No evidence of acute airspace disease. Electronically Signed   By: Sebastian Ache M.D.   On: 07/28/2016 15:23   Dg Abd Portable 1v  Result Date: 07/28/2016 CLINICAL DATA:  Asthma, diabetes, hypertension.  Stroke. EXAM: PORTABLE ABDOMEN - 1 VIEW COMPARISON:  None. FINDINGS: NG tube tip is in the distal stomach. Nonobstructive bowel gas pattern. IMPRESSION: NG tube tip in the distal stomach. No evidence of bowel obstruction. Electronically Signed   By: Charlett Nose M.D.   On: 07/28/2016 15:21   Ct Head Code Stroke Wo Contrast`  Result Date: 07/28/2016 CLINICAL DATA:  Code stroke. Followup post balloon angioplasty for intracranial disease. EXAM: CT HEAD WITHOUT CONTRAST TECHNIQUE: Contiguous axial images were obtained from the base of the skull through the vertex without intravenous contrast. COMPARISON:  Earlier same day FINDINGS: Brain: Left MCA stent is in place. There is contrast staining affecting the deep insula and left posterior frontal brain. Underlying white matter shows old infarction. Old lacunar infarctions are Ing in seen in the basal gangliar regions. Old right posterior frontal cortical infarction is again seen at the vertex. Vascular: No acute vascular  finding. Left MCA stent as seen previously. Skull: Normal Sinuses/Orbits: Normal Other: None ASPECTS ([place]ta Stroke Program Early CT Score) - Ganglionic level infarction (caudate, lentiform nuclei, internal capsule, insula, M1-M3 cortex): No sign of acute insult - Supraganglionic infarction (M4-M6 cortex): Contrast staining related to the angiographic procedure. Total score (0-10 with 10 being normal): Aspects score not Apt global in this setting. IMPRESSION: Contrast staining in the left deep insula and left posterior frontal brain related to angiographic procedure. No other change. Electronically Signed   By: Paulina Fusi M.D.   On: 07/28/2016 13:24     STUDIES:  CT Head 10/6 >> no acute abnormalities, small left frontal lobe subcortical white matter infarct, minor chronic microvascular ischemic changes, L MCA stent.  CT Cerebral Perfusion 10/6 >> possible non-occlusive thrombus in the L MCA stent, chronically occluded or severely diseased A1 segments, mild R M1 MCA stenosis  CULTURES:   ANTIBIOTICS:   SIGNIFICANT EVENTS: 10/06  Admit with L MCA instent thrombus / CVA, to neuro IR, returned to ICU on vent   LINES/TUBES: ETT 10/6 >> 10/7  DISCUSSION: 50 y/o F with hx of PFO s/p repair, L MCA stenosis s/p stent on 10/2 admitted 10/6 with right sided weakness, slurred speech and difficulty swallowing.  To neuro IR for balloon angioplasty x3 for severe/near occlusive instent stenosis..    ASSESSMENT / PLAN:  PULMONARY A: Acute Respiratory Failure - in setting of CVA / altered mental status At Risk Aspiration  P:   Extubate  Pulmonary hygiene  F/u CXR    CARDIOVASCULAR A:  Hx HTN, HLD, PFO s/p Repair P:  ECHO, carotid dopplers pending  ICU monitoring of hemodynamics  Assess lipid panel in am  ASA QD Continue nebivolol, cardene gtt weaning   RENAL A:   Elevated Sr Cr - appears to be at baseline, ~ 1.2  P:   Trend BMP / UOP  Replace electrolytes as indicated    GASTROINTESTINAL A:   Obesity  Hx GERD P:   NPO  Pepcid for SUP Will need formal swallow eval post extubation given CVA  HEMATOLOGIC A:   Hx Anemia  P:  Trend CBC  SCD's for DVT prophylaxis   INFECTIOUS A:   Post Neuro-IR  P:   Monitor fever curve / WBC   ENDOCRINE A:   DM    P:   SSI  Assess A1c  NEUROLOGIC A:   L MCA CVA - CT perfusion study with significant mismatch in the left MCA territory suggestive of salvageable penumbra (per Neurology)  Hx L MCA Stenosis s/p Stent 10/2 - on brilinita + ASA P:   Neurology primary, defer to them for further imaging  Continue ASA  Frequent neuro exams  Follow up MRI  Low dose PRN fent for pain  brilinta  Heparin infusion post angioplasty per Neuro IR / Pharmacy   FAMILY  - Updates: No family at bedside 10/7  - Inter-disciplinary family meet or Palliative Care meeting due by: 10/13   Dirk DressKaty Whiteheart, NP 07/29/2016  11:11 AM Pager: [phone] or [phone]  Attending Note:  I have examined patient, reviewed labs, studies and notes. I have discussed the case with Jasper RilingK Whiteheart, and I agree with the data and plans as amended above. 50 yo yo woman, hx L MCA stent. Intubated to go urgently to IR for stent occlusion on 10/7. Successfully cleared occlusion. On eval this am she was tolerating SBT, was awake. We proceeded with extubation which was tolerated well. We will push pulm hygiene, PT / OT. Wean her nicardipine and initiate enteral BP meds.  Independent critical care time is 40 minutes. PCCM will sign off. Please call if we can assist.   Levy Pupaobert Geneieve Duell, MD, PhD 07/29/2016, 3:55 PM [facility] [phone] or if no answer [phone]

## 2016-07-29 NOTE — Progress Notes (Signed)
PT Cancellation Note  Patient Details Name: Patricia [name] MRN: [id number] DOB: [birth year]   Cancelled Treatment:    Reason Eval/Treat Not Completed: Medical issues which prohibited therapy; patient sedated and intubated.  Will attempt later if able.   [name] 07/29/2016, 8:09 AM  [name], PT [phone] 07/29/2016

## 2016-07-29 NOTE — Progress Notes (Signed)
SLP Cancellation Note  Patient Details Name: Patricia [name] MRN: [id number] DOB: [birth year]   Cancelled treatment:       Reason Eval/Treat Not Completed: Medical issues which prohibited therapy (Pt remains intubated.  Per nursing they will most likely extubate the patient today.   )   Dimas AguasMelissa Mieka Leaton, MA, CCC-SLP Acute Rehab SLP [phone] Dimas AguasGoodman, Dickie Labarre N 07/29/2016, 8:16 AM

## 2016-07-29 NOTE — Progress Notes (Signed)
STROKE TEAM PROGRESS NOTE   HISTORY OF PRESENT ILLNESS (per record) Patricia [name] is an 50 y.o. female with history of multiple vascular risk factors including hypertension diabetes and multiple small left MCA strokes with minimal residual deficit came in with new left-sided weakness and dysarthria. Patient came to the emergency room around 7:20 AM Neurologist was paged at 8:15 AM but code stroke was not activated. Finally code stroke was activated at 9:03 AM. Delay was because of communication and getting IV and CTA. This is considered as a wake up stroke. Patient noticed noticed some drooling on the left side around 9:30 PM last night and went to sleep and woke up at 5 AM with numbness and worsening left-sided weakness and dysarthria. Initial NIH score 6 with mild left-sided weakness involving arm and leg left facial droop and numbness and dysarthria. Patient recently received a stent on 07/24/2016 in supraclenoid portion of ICH and proximal left M1 because patient had prior history of multiple small left MCA stroke secondary to left M1 stenosis CT perfusion study performed today significant perfusion mismatch suggesting large penumbra. Case was discussed with interventional radiologist and after getting informed consent from the patient it was decided she'll be getting the angiogram for further management. Patient is already on aspirin aspirin 325 mg daily, brilinta 45 mg twice a day, and statin therapy patient states that she took her morning dose of her medications   SUBJECTIVE (INTERVAL HISTORY)  Intubated. No family present. Patient becomes agitated during wake up assessments but able to follow some commands. IR team present for sheath removal right groin.   OBJECTIVE Temp:  [97.3 F (36.3 C)-98.1 F (36.7 C)] 98.1 F (36.7 C) (10/07 0359) Pulse Rate:  [61-80] 66 (10/07 0732) Cardiac Rhythm: Normal sinus rhythm (10/06 1930) Resp:  [11-27] 16 (10/07 0732) BP: (87-176)/(65-107)  110/77 (10/07 0732) SpO2:  [98 %-100 %] 100 % (10/07 0732) Arterial Line BP: (103-190)/(59-102) 127/72 (10/07 0700) FiO2 (%):  [30 %-100 %] 30 % (10/07 0732)  CBC:  Recent Labs Lab 07/28/16 0732 07/29/16 0615  WBC 8.6 8.9  NEUTROABS 5.1 5.6  HGB 12.5 12.3  HCT 39.6 37.8  MCV 89.2 86.3  PLT 248 259    Basic Metabolic Panel:  Recent Labs Lab 07/28/16 0732 07/29/16 0615  NA 138 141  K 4.1 3.8  CL 103 109  CO2 25 22  GLUCOSE 174* 99  BUN 13 8  CREATININE 1.20* 0.96  CALCIUM 9.6 9.2    Lipid Panel:    Component Value Date/Time   CHOL 121 07/29/2016 0615   TRIG 149 07/29/2016 0615   HDL 39 (L) 07/29/2016 0615   CHOLHDL 3.1 07/29/2016 0615   VLDL 30 07/29/2016 0615   LDLCALC 52 07/29/2016 0615   HgbA1c:  Lab Results  Component Value Date   HGBA1C 7.4 (H) 07/13/2016   Urine Drug Screen:    Component Value Date/Time   LABOPIA NONE DETECTED 05/22/2014 1551   COCAINSCRNUR NONE DETECTED 05/22/2014 1551   LABBENZ NONE DETECTED 05/22/2014 1551   AMPHETMU NONE DETECTED 05/22/2014 1551   THCU NONE DETECTED 05/22/2014 1551   LABBARB NONE DETECTED 05/22/2014 1551      IMAGING   MR Brain Wo Contrast - pending    Ct Angio Head and Neck W Or Wo Contrast 07/28/2016 1. Possible nonocclusive thrombus in the left MCA stent. Abnormally prolonged transit throughout the left MCA superior division without evidence of acute core infarct or large vessel occlusion.  2. Chronically occluded  or severely diseased A1 segments.  3. Mild right M1 MCA stenosis.     Ct Head Wo Contrast 07/28/2016 1. No acute intracranial abnormalities.  2. 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 Minor chronic microvascular ischemic change.  3. Left middle cerebral artery stent new since the prior study. No other change.      Ct Cerebral Perfusion W Contrast 07/28/2016 1. Possible nonocclusive thrombus in the left MCA stent. Abnormally prolonged transit throughout the left  MCA superior division without evidence of acute core infarct or large vessel occlusion.  2. Chronically occluded or severely diseased A1 segments.  3. Mild right M1 MCA stenosis.     Dg Chest Port 1 View 07/29/2016 Endotracheal tube and nasogastric tube well position. Mild atelectasis in the lower lungs persists.     Portable Chest Xray 07/28/2016 Endotracheal tube as above.  No evidence of acute airspace disease.    Dg Abd Portable 1v 07/28/2016 NG tube tip in the distal stomach. No evidence of bowel obstruction.    Ct Head Code Stroke Wo Contrast` 07/28/2016 Contrast staining in the left deep insula and left posterior frontal brain related to angiographic procedure. No other change.     CEREBRAL ANGIOGRAM [[id number] (Custom)] 07/28/2016 S/P lt CCA arteriogram followed by balloon angioplasty x 3 using 15mm x2 ,and x 1 2.25 mm x 15 mm Gateway balloon cathters for severe near occlusive instent stenosis.     PHYSICAL EXAM  Gen. lying in bed and groin sheath being pulled. Sedated on propofol drip Neck supple without any lymphadenopathy Lungs clear to auscultation CVS S1-S2 regular Skin no signs or scars or bruises Mental Status:  limited as she is intubated and sedated. Withdraws all 4 limbs to pain left more than right Deep Tendon Reflexes: 2+ and symmetric throughout Plantars: Right: downgoing                                Left: downgoing Cerebellar: Finger to nose intact Gait: Gait not assessed     ASSESSMENT/PLAN Patricia [name] is a 50 y.o. female with history of cerebrovascular stenting on 07/24/2016, diabetes mellitus, syncope, previous stroke, PFO, hypertension, headaches, asthma, depression, and anemia presenting with new onset left-sided weakness and dysarthria -> IR for angiogram -> balloon angioplasty for severe near occlusive instent stenosis.   She did not receive IV t-PA due to unknown time of onset.  Stroke:  Non-dominant  infarct  embolic secondary to a thrombus in the left middle cerebral artery stent.  Resultant  Mild right hemiplegia  MRI  pending  MRA  pending  Carotid Doppler - CTA Neck  2D Echo - not ordered  LDL - 52  HgbA1c - 7.4  VTE prophylaxis - SCDs  Diet NPO time specified  aspirin 81 mg daily and Brilinta prior to admission, now on aspirin 325 mg daily and Brilinta 45 mg twice daily.  Ongoing aggressive stroke risk factor management  Therapy recommendations: pending  Disposition: Pending  Hypertension  Stable - current goal SBP 120 - 140  Permissive hypertension (OK if < 220/120) but gradually normalize in 5-7 days  Long-term BP goal normotensive  Hyperlipidemia  Home meds: Pravachol 40 mg daily, not resumed in hospital  LDL 52, goal < 70  Restart statin when taking POs.  Continue statin at discharge  Diabetes  HgbA1c 7.4, goal < 7.0  Uncontrolled  Other Stroke Risk Factors   ETOH use,  advised to drink no more than 1 drink(s) a day  Obesity, Body mass index is 35.94 kg/m., recommend weight loss, diet and exercise as appropriate   Hx stroke/TIA   Other Active Problems    Hospital day # 1  Delton See PA-C [facility] Pager [phone] 07/29/2016, 2:01 PM I have personally examined this patient, reviewed notes, independently viewed imaging studies, participated in medical decision making and plan of care.ROS completed by me personally and pertinent positives fully documented  I have made any additions or clarifications directly to the above note. Agree with note above. Patient had elective left MCA angioplasty and stenting 2 days ago and presented with recurrent ischemic symptoms and underwent emergent angioplasty for near occlusive stenosis in side the stent. She has failed aspirin and Brilinta and had shown resistance to Plavix. I will discuss the case with Dr. Corliss Skains and recommend changing Brilinta to Plavix  As the patient appears to be  at high risk for stent thrombosis and restenosis in the future. Plan to remove groin sheath today and extubate later today. Check MRI scan of the brain later. Discuss with Dr. Corliss Skains and Dr. Idelle Leech. This patient is critically ill and at significant risk of neurological worsening, death and care requires constant monitoring of vital signs, hemodynamics,respiratory and cardiac monitoring, extensive review of multiple databases, frequent neurological assessment, discussion with family, other specialists and medical decision making of high complexity.I have made any additions or clarifications directly to the above note.This critical care time does not reflect procedure time, or teaching time or supervisory time of PA/NP/Med Resident etc but could involve care discussion time.  I spent 30 minutes of neurocritical care time  in the care of  this patient.      Delia Heady, MD Medical Director [facility] Pager: [phone] 07/29/2016 3:17 PM   To contact Stroke Continuity provider, please refer to [email or web address]. After hours, contact General Neurology

## 2016-07-29 NOTE — Progress Notes (Signed)
Referring Physician(s): Dr Vladimir Faster  Supervising Physician: Julieanne Cotton  Patient Status:  Inpatient  Chief Complaint:  L MCA intra stent stenosis Previous pta/stent 07/24/2016 10/6: IMPRESSION: 1. Possible nonocclusive thrombus in the left MCA stent. Abnormally prolonged transit throughout the left MCA superior division without evidence of acute core infarct or large vessel occlusion. 2. Chronically occluded or severely diseased A1 segments. 3. Mild right M1 MCA stenosis.  Subjective:  Pt returned to ED 10/6 pm with Rt sided facial droop; slurred speech Cerebral angiogram revealed Intra stent stenosis 10/6 pm CEREBRAL ANGIOGRAM [[id number] (Custom)]       S/P lt CCA arteriogram followed by balloon angioplasty x 3 using 15mm x2 ,and x 1 2.25 mm x 15 mm Gateway balloon cathters for severe near occlusive instent stenosis.     On vent No response Rt groin sheath just removed  Allergies: Imitrex [sumatriptan]; Plavix [clopidogrel bisulfate]; and Lipitor [atorvastatin]  Medications: Prior to Admission medications   Medication Sig Start Date End Date Taking? Authorizing Provider  aspirin EC 81 MG tablet Take 81 mg by mouth at bedtime.   Yes Historical Provider, MD  chlorthalidone (HYGROTON) 25 MG tablet Take 25 mg by mouth at bedtime.  09/03/15  Yes Historical Provider, MD  diphenhydramine-acetaminophen (TYLENOL PM) 25-500 MG TABS tablet Take 2 tablets by mouth at bedtime as needed (for pain or sleep).    Yes Historical Provider, MD  Empagliflozin-Metformin HCl (SYNJARDY) 12.02-999 MG TABS Take 1 tablet by mouth 2 (two) times daily.    Yes Historical Provider, MD  fluconazole (DIFLUCAN) 150 MG tablet Take 150 mg by mouth daily as needed (for yeast infections).   Yes Historical Provider, MD  gabapentin (NEURONTIN) 250 MG/5ML solution Take 3 mLs (150 mg total) by mouth 3 (three) times daily. Patient taking differently: Take 150 mg by mouth at bedtime.  06/07/16  Yes  Marvel Plan, MD  lamoTRIgine (LAMICTAL) 25 MG tablet 25mg  Qhs x 2 wks, then 25mg  bid x 2 wks, then 25mg  am/50mg  pm x 2 wks, then 50mg  bid x 2 wks, then 50mg  am/100mg  pm x 2 wks, then 100mg  bid Patient taking differently: Take 100 mg by mouth 2 (two) times daily.  05/15/16  Yes Marvel Plan, MD  Nebivolol HCl (BYSTOLIC) 20 MG TABS Take 20 mg by mouth every evening.    Yes Historical Provider, MD  oxyCODONE-acetaminophen (PERCOCET) 10-325 MG tablet Take 1 tablet by mouth every 4 (four) hours as needed for pain.   Yes Historical Provider, MD  pravastatin (PRAVACHOL) 40 MG tablet Take 40 mg by mouth at bedtime.   Yes Historical Provider, MD  promethazine (PHENERGAN) 25 MG tablet Take 25 mg by mouth 3 (three) times daily as needed for nausea or vomiting.    Yes Historical Provider, MD  SOLIQUA 100-33 UNT-MCG/ML SOPN INJECT 32 UNITS UNDER THE SKIN DAILY in the evening AS NEEDED FOR BLOOD SUGAR 02/25/16  Yes Historical Provider, MD  ticagrelor (BRILINTA) 90 MG TABS tablet Take 45 mg by mouth 2 (two) times daily.    Yes Historical Provider, MD  tiZANidine (ZANAFLEX) 4 MG tablet Take 4 mg by mouth every 8 (eight) hours as needed (migraines).    Yes Historical Provider, MD     Vital Signs: BP (!) 134/91   Pulse 66   Temp 97.8 F (36.6 C) (Oral)   Resp 16   Ht 5' 5.5" (1.664 m)   Wt 219 lb 4.8 oz (99.5 kg)   SpO2 100%  BMI 35.94 kg/m   Physical Exam  Abdominal: Soft. Bowel sounds are normal.  Neurological:  No response Vent   Skin: Skin is warm and dry.  Rt groin soft No bleeding Rt foot 2+ pulses  Nursing note and vitals reviewed.   Imaging: Ct Angio Head W Or Wo Contrast  Result Date: 07/28/2016 CLINICAL DATA:  Right-sided weakness, numbness, and slurred speech. EXAM: CT ANGIOGRAPHY HEAD AND NECK CT CEREBRAL PERFUSION WITH CONTRAST TECHNIQUE: Multidetector CT imaging of the head and neck was performed using the standard protocol during bolus administration of intravenous contrast.  Multiplanar CT image reconstructions and MIPs were obtained to evaluate the vascular anatomy. Carotid stenosis measurements (when applicable) are obtained utilizing NASCET criteria, using the distal internal carotid diameter as the denominator. CT cerebral perfusion imaging was also performed during the dynamic injection of intravenous contrast with subsequent post processing. CONTRAST:  100 mL Isovue 370 COMPARISON:  Cerebral angiogram 07/24/2016. Head MRI 06/18/2016. Head MRA 02/21/2015. Head and neck CTA 07/03/2014. FINDINGS: CTA NECK FINDINGS Aortic arch: 3 vessel aortic arch. Brachiocephalic and subclavian arteries are widely patent. Right carotid system: Patent without evidence of stenosis or dissection. Left carotid system: Patent without evidence of stenosis or dissection. Vertebral arteries: Patent without evidence of stenosis or dissection. Right vertebral artery is minimally larger than the left. Skeleton: Mild cervical spondylosis and facet arthrosis. Other neck: No evidence of acute abnormality or mass. Upper chest: Clear lung apices. Review of the MIP images confirms the above findings CTA HEAD FINDINGS Anterior circulation: Internal carotid arteries are patent from skullbase to carotid termini with left greater than right carotid siphon atherosclerosis without evidence of significant stenosis. A stent is present in the left M1 segment and appears patent, however there is suggestion of a linear nonocclusive filling defect in the midportion of the stent. Major left M2 branches are patent. There is mild right M1 stenosis, similar to prior. No evidence of right MCA major branch occlusion. The A1 segments are again not clearly visualized consistent with chronic occlusion or severe narrowing with diminished flow. Small collateral vessels are present in this region. There is left A2 reconstitution as before with moderate distal irregularity and attenuation. No aneurysm is identified. Posterior circulation:  Intracranial vertebral arteries are patent to the basilar with the right being dominant. The left vertebral artery is small distal to the PICA. Right PICA is hypoplastic. SCA origins are patent. The basilar artery is patent with mild focal narrowing in its midportion. There are patent posterior communicating arteries, left larger than right. The right P1 segment is hypoplastic. The left P1 segment is severely hypoplastic or absent. There is mild PCA irregularity bilaterally with similar appearance of mild left P2 segment stenoses. No aneurysm is identified. Venous sinuses: Patent. Anatomic variants: Fetal type origin of the left PCA. Review of the MIP images confirms the above findings CT CEREBRAL PERFUSION FINDINGS There is prolonged time to peak throughout the left MCA superior division, with more focal region of greater prolonged transit in the high left frontal lobe. Cerebral blood flow is diminished throughout this entire superior M2 region, though without diminished cerebral blood volume or cerebral blood flow less than 30% of the contralateral side to suggest acute core infarct. In fact, cerebral blood volume is actually mildly increased compared to the contralateral side. IMPRESSION: 1. Possible nonocclusive thrombus in the left MCA stent. Abnormally prolonged transit throughout the left MCA superior division without evidence of acute core infarct or large vessel occlusion. 2. Chronically occluded or  severely diseased A1 segments. 3. Mild right M1 MCA stenosis. Findings were discussed in person with Drs. Desmond Lopeurk and Deveshwar on 07/28/2016 at 9:30 a.m. Electronically Signed   By: Sebastian AcheAllen  Grady M.D.   On: 07/28/2016 10:19   Ct Head Wo Contrast  Result Date: 07/28/2016 CLINICAL DATA:  Pt had right arm weakness and right sided facial numbness last night. Pt woke up with slurred speech this AM. Pt had stent placed on Monday. EXAM: CT HEAD WITHOUT CONTRAST TECHNIQUE: Contiguous axial images were obtained from the  base of the skull through the vertex without intravenous contrast. COMPARISON:  08/13/2015 FINDINGS: Brain: The ventricles are normal in size and configuration. There are no parenchymal masses or mass effect. Focal area of hypoattenuation in the subcortical white matter of the left frontal lobe, consistent with an old infarct, is stable. There is minor periventricular white matter hypoattenuation consistent with chronic microvascular ischemic change, also stable. There is no evidence of a recent infarct. There are no extra-axial masses or abnormal fluid collections. There is no intracranial hemorrhage. Vascular: Left middle cerebral artery stent is new since the prior study. Vessels of the skullbase are otherwise unremarkable. Skull: Normal. Negative for fracture or focal lesion. Sinuses/Orbits: No acute finding. Other: None IMPRESSION: 1. No acute intracranial abnormalities. 2. 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 Minor chronic microvascular ischemic change. 3. Left middle cerebral artery stent new since the prior study. No other change. Electronically Signed   By: Amie Portlandavid  Ormond M.D.   On: 07/28/2016 08:35   Ct Angio Neck W Or Wo Contrast  Result Date: 07/28/2016 CLINICAL DATA:  Right-sided weakness, numbness, and slurred speech. EXAM: CT ANGIOGRAPHY HEAD AND NECK CT CEREBRAL PERFUSION WITH CONTRAST TECHNIQUE: Multidetector CT imaging of the head and neck was performed using the standard protocol during bolus administration of intravenous contrast. Multiplanar CT image reconstructions and MIPs were obtained to evaluate the vascular anatomy. Carotid stenosis measurements (when applicable) are obtained utilizing NASCET criteria, using the distal internal carotid diameter as the denominator. CT cerebral perfusion imaging was also performed during the dynamic injection of intravenous contrast with subsequent post processing. CONTRAST:  100 mL Isovue 370 COMPARISON:  Cerebral angiogram  07/24/2016. Head MRI 06/18/2016. Head MRA 02/21/2015. Head and neck CTA 07/03/2014. FINDINGS: CTA NECK FINDINGS Aortic arch: 3 vessel aortic arch. Brachiocephalic and subclavian arteries are widely patent. Right carotid system: Patent without evidence of stenosis or dissection. Left carotid system: Patent without evidence of stenosis or dissection. Vertebral arteries: Patent without evidence of stenosis or dissection. Right vertebral artery is minimally larger than the left. Skeleton: Mild cervical spondylosis and facet arthrosis. Other neck: No evidence of acute abnormality or mass. Upper chest: Clear lung apices. Review of the MIP images confirms the above findings CTA HEAD FINDINGS Anterior circulation: Internal carotid arteries are patent from skullbase to carotid termini with left greater than right carotid siphon atherosclerosis without evidence of significant stenosis. A stent is present in the left M1 segment and appears patent, however there is suggestion of a linear nonocclusive filling defect in the midportion of the stent. Major left M2 branches are patent. There is mild right M1 stenosis, similar to prior. No evidence of right MCA major branch occlusion. The A1 segments are again not clearly visualized consistent with chronic occlusion or severe narrowing with diminished flow. Small collateral vessels are present in this region. There is left A2 reconstitution as before with moderate distal irregularity and attenuation. No aneurysm is identified. Posterior circulation: Intracranial vertebral  arteries are patent to the basilar with the right being dominant. The left vertebral artery is small distal to the PICA. Right PICA is hypoplastic. SCA origins are patent. The basilar artery is patent with mild focal narrowing in its midportion. There are patent posterior communicating arteries, left larger than right. The right P1 segment is hypoplastic. The left P1 segment is severely hypoplastic or absent. There  is mild PCA irregularity bilaterally with similar appearance of mild left P2 segment stenoses. No aneurysm is identified. Venous sinuses: Patent. Anatomic variants: Fetal type origin of the left PCA. Review of the MIP images confirms the above findings CT CEREBRAL PERFUSION FINDINGS There is prolonged time to peak throughout the left MCA superior division, with more focal region of greater prolonged transit in the high left frontal lobe. Cerebral blood flow is diminished throughout this entire superior M2 region, though without diminished cerebral blood volume or cerebral blood flow less than 30% of the contralateral side to suggest acute core infarct. In fact, cerebral blood volume is actually mildly increased compared to the contralateral side. IMPRESSION: 1. Possible nonocclusive thrombus in the left MCA stent. Abnormally prolonged transit throughout the left MCA superior division without evidence of acute core infarct or large vessel occlusion. 2. Chronically occluded or severely diseased A1 segments. 3. Mild right M1 MCA stenosis. Findings were discussed in person with Drs. Desmond Lope and Deveshwar on 07/28/2016 at 9:30 a.m. Electronically Signed   By: Sebastian Ache M.D.   On: 07/28/2016 10:19   Ct Cerebral Perfusion W Contrast  Result Date: 07/28/2016 CLINICAL DATA:  Right-sided weakness, numbness, and slurred speech. EXAM: CT ANGIOGRAPHY HEAD AND NECK CT CEREBRAL PERFUSION WITH CONTRAST TECHNIQUE: Multidetector CT imaging of the head and neck was performed using the standard protocol during bolus administration of intravenous contrast. Multiplanar CT image reconstructions and MIPs were obtained to evaluate the vascular anatomy. Carotid stenosis measurements (when applicable) are obtained utilizing NASCET criteria, using the distal internal carotid diameter as the denominator. CT cerebral perfusion imaging was also performed during the dynamic injection of intravenous contrast with subsequent post processing.  CONTRAST:  100 mL Isovue 370 COMPARISON:  Cerebral angiogram 07/24/2016. Head MRI 06/18/2016. Head MRA 02/21/2015. Head and neck CTA 07/03/2014. FINDINGS: CTA NECK FINDINGS Aortic arch: 3 vessel aortic arch. Brachiocephalic and subclavian arteries are widely patent. Right carotid system: Patent without evidence of stenosis or dissection. Left carotid system: Patent without evidence of stenosis or dissection. Vertebral arteries: Patent without evidence of stenosis or dissection. Right vertebral artery is minimally larger than the left. Skeleton: Mild cervical spondylosis and facet arthrosis. Other neck: No evidence of acute abnormality or mass. Upper chest: Clear lung apices. Review of the MIP images confirms the above findings CTA HEAD FINDINGS Anterior circulation: Internal carotid arteries are patent from skullbase to carotid termini with left greater than right carotid siphon atherosclerosis without evidence of significant stenosis. A stent is present in the left M1 segment and appears patent, however there is suggestion of a linear nonocclusive filling defect in the midportion of the stent. Major left M2 branches are patent. There is mild right M1 stenosis, similar to prior. No evidence of right MCA major branch occlusion. The A1 segments are again not clearly visualized consistent with chronic occlusion or severe narrowing with diminished flow. Small collateral vessels are present in this region. There is left A2 reconstitution as before with moderate distal irregularity and attenuation. No aneurysm is identified. Posterior circulation: Intracranial vertebral arteries are patent to the basilar with the  right being dominant. The left vertebral artery is small distal to the PICA. Right PICA is hypoplastic. SCA origins are patent. The basilar artery is patent with mild focal narrowing in its midportion. There are patent posterior communicating arteries, left larger than right. The right P1 segment is hypoplastic.  The left P1 segment is severely hypoplastic or absent. There is mild PCA irregularity bilaterally with similar appearance of mild left P2 segment stenoses. No aneurysm is identified. Venous sinuses: Patent. Anatomic variants: Fetal type origin of the left PCA. Review of the MIP images confirms the above findings CT CEREBRAL PERFUSION FINDINGS There is prolonged time to peak throughout the left MCA superior division, with more focal region of greater prolonged transit in the high left frontal lobe. Cerebral blood flow is diminished throughout this entire superior M2 region, though without diminished cerebral blood volume or cerebral blood flow less than 30% of the contralateral side to suggest acute core infarct. In fact, cerebral blood volume is actually mildly increased compared to the contralateral side. IMPRESSION: 1. Possible nonocclusive thrombus in the left MCA stent. Abnormally prolonged transit throughout the left MCA superior division without evidence of acute core infarct or large vessel occlusion. 2. Chronically occluded or severely diseased A1 segments. 3. Mild right M1 MCA stenosis. Findings were discussed in person with Drs. Desmond Lope and Deveshwar on 07/28/2016 at 9:30 a.m. Electronically Signed   By: Sebastian Ache M.D.   On: 07/28/2016 10:19   Dg Chest Port 1 View  Result Date: 07/29/2016 CLINICAL DATA:  Ventilator support.  Stroke. EXAM: PORTABLE CHEST 1 VIEW COMPARISON:  07/28/2016 FINDINGS: Endotracheal tube has its tip 5 cm above the carina. Nasogastric tube enters stomach. Mild atelectasis in both lower lobes is unchanged. No worsening or new findings. IMPRESSION: Endotracheal tube and nasogastric tube well position. Mild atelectasis in the lower lungs persists. Electronically Signed   By: Paulina Fusi M.D.   On: 07/29/2016 07:22   Portable Chest Xray  Result Date: 07/28/2016 CLINICAL DATA:  Intubation. EXAM: PORTABLE CHEST 1 VIEW COMPARISON:  02/20/2015 FINDINGS: Endotracheal tube terminates  at the clavicular heads, approximately 5 cm above the carina. Enteric tube courses into the left upper abdomen with tip not imaged. Cardiac silhouette remains mildly enlarged. Lung volumes are low with elevation of the right hemidiaphragm. No airspace consolidation, edema, pleural effusion, or pneumothorax is identified. IMPRESSION: Endotracheal tube as above.  No evidence of acute airspace disease. Electronically Signed   By: Sebastian Ache M.D.   On: 07/28/2016 15:23   Dg Abd Portable 1v  Result Date: 07/28/2016 CLINICAL DATA:  Asthma, diabetes, hypertension.  Stroke. EXAM: PORTABLE ABDOMEN - 1 VIEW COMPARISON:  None. FINDINGS: NG tube tip is in the distal stomach. Nonobstructive bowel gas pattern. IMPRESSION: NG tube tip in the distal stomach. No evidence of bowel obstruction. Electronically Signed   By: Charlett Nose M.D.   On: 07/28/2016 15:21   Ct Head Code Stroke Wo Contrast`  Result Date: 07/28/2016 CLINICAL DATA:  Code stroke. Followup post balloon angioplasty for intracranial disease. EXAM: CT HEAD WITHOUT CONTRAST TECHNIQUE: Contiguous axial images were obtained from the base of the skull through the vertex without intravenous contrast. COMPARISON:  Earlier same day FINDINGS: Brain: Left MCA stent is in place. There is contrast staining affecting the deep insula and left posterior frontal brain. Underlying white matter shows old infarction. Old lacunar infarctions are Ing in seen in the basal gangliar regions. Old right posterior frontal cortical infarction is again seen at the vertex.  Vascular: No acute vascular finding. Left MCA stent as seen previously. Skull: Normal Sinuses/Orbits: Normal Other: None ASPECTS ([place] Stroke Program Early CT Score) - Ganglionic level infarction (caudate, lentiform nuclei, internal capsule, insula, M1-M3 cortex): No sign of acute insult - Supraganglionic infarction (M4-M6 cortex): Contrast staining related to the angiographic procedure. Total score (0-10 with 10  being normal): Aspects score not Apt global in this setting. IMPRESSION: Contrast staining in the left deep insula and left posterior frontal brain related to angiographic procedure. No other change. Electronically Signed   By: Paulina Fusi M.D.   On: 07/28/2016 13:24    Labs:  CBC:  Recent Labs  07/24/16 0641 07/25/16 0107 07/28/16 0732 07/29/16 0615  WBC 7.6 6.7 8.6 8.9  HGB 12.9 11.4* 12.5 12.3  HCT 40.6 36.6 39.6 37.8  PLT 235 204 248 259    COAGS:  Recent Labs  07/03/16 0634 07/13/16 0723 07/24/16 0641 07/28/16 0732  INR 0.93 1.00 0.97 0.90  APTT 27 29 27 30     BMP:  Recent Labs  07/24/16 0641 07/25/16 0107 07/28/16 0732 07/29/16 0615  NA 141 137 138 141  K 4.2 4.3 4.1 3.8  CL 107 108 103 109  CO2 27 26 25 22   GLUCOSE 125* 91 174* 99  BUN 17 11 13 8   CALCIUM 9.6 8.9 9.6 9.2  CREATININE 1.11* 1.09* 1.20* 0.96  GFRNONAA 57* 59* 52* >60  GFRAA >60 >60 >60 >60    LIVER FUNCTION TESTS:  Recent Labs  07/13/16 0723 07/28/16 0732 07/29/16 0615  BILITOT 0.5 0.2* 0.4  AST 20 35 25  ALT 17 32 23  ALKPHOS 115 114 114  PROT 7.2 7.1 6.6  ALBUMIN 3.8 3.8 3.4*    Assessment and Plan:  L MCA stenosis---with pta/stent placed in IR with Dr Corliss Skains 10/2 Discharged 10/3 Returned to ED 10/6 with Rt face droop and slurred speech Intra stent stenosis---non occlusive thrombus Angioplasty 10/6 pm---   Electronically Signed: Shanora Christensen A 07/29/2016, 9:53 AM   I spent a total of 15 Minutes at the the patient's bedside AND on the patient's hospital floor or unit, greater than 50% of which was counseling/coordinating care for L MCA pta

## 2016-07-30 LAB — CBC
HEMATOCRIT: 35.6 % — AB (ref 36.0–46.0)
HEMOGLOBIN: 11.2 g/dL — AB (ref 12.0–15.0)
MCH: 27.8 pg (ref 26.0–34.0)
MCHC: 31.5 g/dL (ref 30.0–36.0)
MCV: 88.3 fL (ref 78.0–100.0)
Platelets: 241 10*3/uL (ref 150–400)
RBC: 4.03 MIL/uL (ref 3.87–5.11)
RDW: 13.3 % (ref 11.5–15.5)
WBC: 7.8 10*3/uL (ref 4.0–10.5)

## 2016-07-30 LAB — BASIC METABOLIC PANEL
ANION GAP: 7 (ref 5–15)
BUN: 6 mg/dL (ref 6–20)
CALCIUM: 8.8 mg/dL — AB (ref 8.9–10.3)
CHLORIDE: 109 mmol/L (ref 101–111)
CO2: 23 mmol/L (ref 22–32)
Creatinine, Ser: 0.99 mg/dL (ref 0.44–1.00)
GFR calc non Af Amer: >60 mL/min (ref >60)
GLUCOSE: 85 mg/dL (ref 65–99)
Potassium: 3.8 mmol/L (ref 3.5–5.1)
Sodium: 139 mmol/L (ref 135–145)

## 2016-07-30 LAB — HEMOGLOBIN A1C
Hgb A1c MFr Bld: 7.2 % — ABNORMAL HIGH (ref 4.8–5.6)
Mean Plasma Glucose: 160 mg/dL

## 2016-07-30 LAB — GLUCOSE, CAPILLARY
GLUCOSE-CAPILLARY: 80 mg/dL (ref 65–99)
GLUCOSE-CAPILLARY: 84 mg/dL (ref 65–99)
Glucose-Capillary: 78 mg/dL (ref 65–99)

## 2016-07-30 MED ORDER — TICAGRELOR 90 MG PO TABS: 90.0000 mg | ORAL_TABLET | Freq: Two times a day (BID) | ORAL | 2 refills | Status: DC

## 2016-07-30 MED ORDER — ASPIRIN 81 MG PO CHEW
81.0000 mg | CHEWABLE_TABLET | Freq: Every day | ORAL | Status: DC
  Administered 2016-07-30: 81 mg via ORAL
  Filled 2016-07-30: qty 1

## 2016-07-30 MED ORDER — TICAGRELOR 90 MG PO TABS
90.0000 mg | ORAL_TABLET | Freq: Two times a day (BID) | ORAL | Status: DC
  Administered 2016-07-30: 90 mg via ORAL
  Filled 2016-07-30: qty 1

## 2016-07-30 NOTE — Evaluation (Signed)
Clinical/Bedside Swallow Evaluation Patient Details  Name: Patricia [name] MRN: [id number] Date of Birth: 10/15/1966  Today's Date: 07/30/2016 Time: SLP Start Time (ACUTE ONLY): 0950 SLP Stop Time (ACUTE ONLY): 1030 SLP Time Calculation (min) (ACUTE ONLY): 21 min  Past Medical History:  Past Medical History:  Diagnosis Date  . Anemia    in the past  . Asthma    " a long time ago"  . Bursitis   . Depression    13 years ago -   . Diabetes mellitus without complication (HCC)    type  2  . GERD (gastroesophageal reflux disease)    occ. acid reflux  . Headache    migraines  . Hypertension   . Kidney stones   . PFO (patent foramen ovale)    hx of PFO with surgery done as an adult  . Rotator cuff tear    right  . Stroke [facility]) 2009   lasdt stroke July 2015  . Syncope and collapse    Past Surgical History:  Past Surgical History:  Procedure Laterality Date  . ABDOMINAL HYSTERECTOMY    . BREAST REDUCTION SURGERY    . CARDIAC CATHETERIZATION    . CARDIAC SURGERY  Closed hole in heart in 2009   Amplatzer Septal Occluder Ref [id number] (not listed in op notes, document scanned under MRI with written op notes  . HYSTEROTOMY    . IR GENERIC HISTORICAL  06/23/2016   IR ANGIO INTRA EXTRACRAN SEL COM CAROTID INNOMINATE BILAT MOD SED 06/23/2016 Julieanne Cotton, MD [facility]  . IR GENERIC HISTORICAL  06/23/2016   IR ANGIO VERTEBRAL SEL VERTEBRAL BILAT MOD SED 06/23/2016 Julieanne Cotton, MD [facility]  . IR GENERIC HISTORICAL  06/29/2016   IR RADIOLOGIST EVAL & MGMT 06/29/2016 [facility]  . IR GENERIC HISTORICAL  07/24/2016   IR INTRA CRAN STENT 07/24/2016 Julieanne Cotton, MD [facility]  . RADIOLOGY WITH ANESTHESIA [name] 07/24/2016   Procedure: Stenting;  Surgeon: Julieanne Cotton, MD;  Location: [facility] OR;  Service: Radiology;  Laterality: [name];  . RADIOLOGY WITH ANESTHESIA [name] 07/28/2016   Procedure: RADIOLOGY WITH ANESTHESIA;  Surgeon: Medication Radiologist, MD;  Location: [facility]  OR;  Service: Radiology;  Laterality: [name];  . SHOULDER ARTHROSCOPY WITH OPEN ROTATOR CUFF REPAIR AND DISTAL CLAVICLE ACROMINECTOMY Right 02/04/2015   Procedure: SHOULDER ARTHROSCOPY WITH SUBACROMIAL DECOMPRESSION AND OPEN ROTATOR CUFF REPAIR,;  Surgeon: Cammy Copa, MD;  Location: [facility];  Service: Orthopedics;  Laterality: Right;  RIGHT SHOULDER DOA,SAD,MINI-OPEN ROTATOR CUFF TEAR REPAIR.   HPI:  50 y/o  with PMH of anemia, depression, headaches, GERD, DM, HTN, renal stones, syncope, PFO, CVA (2009) s/p repair who presented to [facility] on am of 10/6 with reports of right sided weakness, slurred speech &difficulty swallowing. Per chart patient seen on Monday 07/24/16 for left MCA angioplasty with stent placement due to 95% stenosis and underwent a left CCA arteriogram followed by balloon angioplasty x3 for severe/near occlusive instent stenosis. MRI patchy multi focal acute ischemic predominantly cortical left MCA territory infarcts. She returned to ICU on mechanical ventilation on 10/6 and extubated 07/29/16.   Assessment / Plan / Recommendation Clinical Impression  Pt exhibites mild-moderate oral dysphagia marked by decreased labial seal, decreased cohesion with anterior spill. Pharyngeal phase was unremarkable across consistencies. Recommend regular texture and thin liquids.     Aspiration Risk   (mild-mod)    Diet Recommendation Regular;Thin liquid   Liquid Administration via: Cup;Straw Medication Administration: Whole meds with puree Supervision: Patient able  to self feed;Intermittent supervision to cue for compensatory strategies Compensations: Slow rate;Small sips/bites Postural Changes: Seated upright at 90 degrees    Other  Recommendations Oral Care Recommendations: Oral care BID   Follow up Recommendations None      Frequency and Duration min 1 x/week  1 week       Prognosis Prognosis for Safe Diet Advancement: Good      Swallow Study   General HPI: 50 y/o  with PMH of  anemia, depression, headaches, GERD, DM, HTN, renal stones, syncope, PFO, CVA (2009) s/p repair who [facility] with reports of right sided weakness, slurred speech &difficulty swallowing. Per chart patient seen on Monday 07/24/16 for left MCA angioplasty with stent placement due to 95% stenosis and underwent a left CCA arteriogram followed by balloon angioplasty x3 for severe/near occlusive instent stenosis. MRI patchy multi focal acute ischemic predominantly cortical left MCA territory infarcts. She returned to ICU on mechanical ventilation on 10/6 and extubated 07/29/16. Type of Study: Bedside Swallow Evaluation Previous Swallow Assessment:  (none) Diet Prior to this Study: NPO Temperature Spikes Noted: No Respiratory Status: Room air History of Recent Intubation: Yes Length of Intubations (days): 1 days Date extubated: 07/29/16 Behavior/Cognition: Alert;Cooperative;Pleasant mood Oral Cavity Assessment: Within Functional Limits Oral Care Completed by SLP: No Oral Cavity - Dentition: Missing dentition Vision: Functional for self-feeding Self-Feeding Abilities: Able to feed self Patient Positioning: Upright in bed Baseline Vocal Quality: Normal Volitional Cough: Strong Volitional Swallow: Able to elicit    Oral/Motor/Sensory Function Overall Oral Motor/Sensory Function: Moderate impairment Facial ROM: Reduced right;Suspected CN VII (facial) dysfunction Facial Symmetry: Abnormal symmetry right;Suspected CN VII (facial) dysfunction Facial Strength: Reduced right;Suspected CN VII (facial) dysfunction Lingual ROM: Within Functional Limits Lingual Symmetry: Abnormal symmetry right Lingual Strength: Reduced;Suspected CN XII (hypoglossal) dysfunction Velum: Within Functional Limits Mandible: Within Functional Limits   Ice Chips Ice chips: Not tested   Thin Liquid Thin Liquid: Impaired Presentation: Cup;Straw Oral Phase Impairments: Reduced labial seal Oral Phase  Functional Implications: Right anterior spillage    Nectar Thick Nectar Thick Liquid: Not tested   Honey Thick Honey Thick Liquid: Not tested   Puree Puree: Impaired Presentation: Self Fed Oral Phase Impairments:  (labial residue)   Solid   GO   Solid: Within functional limits        Roque CashLitaker, Breck CoonsLisa Willis 07/30/2016,1:43 PM   Breck CoonsLisa Willis WarriorLitaker M.Ed [employer] Pager [phone]

## 2016-07-30 NOTE — Discharge Instructions (Signed)
1. Gradually increase activity as tolerated. 2. No driving until cleared by a physician or therapist. 3. Home physical, occupational, and speech therapies. 4. May return to work when cleared by a physician or therapist.

## 2016-07-30 NOTE — Evaluation (Signed)
Speech Language Pathology Evaluation Patient Details Name: Patricia [name] MRN: [id number] DOB: Dec 03, 1965 Today's Date: 07/30/2016 Time: 2130-8657 SLP Time Calculation (min) (ACUTE ONLY): 39 min  Problem List:  Patient Active Problem List   Diagnosis Date Noted  . Cerebrovascular accident (CVA) (HCC) 07/28/2016  . Acute respiratory failure (HCC)   . Middle cerebral artery stenosis, left 07/24/2016  . Headache 03/23/2016  . Type 2 diabetes mellitus with complication, with long-term current use of insulin (HCC) 09/08/2015  . Hyperglycemia   . TIA (transient ischemic attack)   . AKI (acute kidney injury) (HCC) 02/20/2015  . ARF (acute renal failure) (HCC) 02/20/2015  . Rotator cuff tear 02/04/2015  . Occipital neuralgia 08/27/2014  . Type 2 diabetes mellitus with complication (HCC) 08/27/2014  . PFO (patent foramen ovale) 06/17/2014  . HLD (hyperlipidemia) 06/17/2014  . Morbid obesity (HCC) 06/17/2014  . Acute CVA (cerebrovascular accident) (HCC) 05/22/2014  . Non-compliance 05/22/2014  . History of cardioembolic stroke 05/22/2014  . Status post device closure of ASD 05/22/2014  . HTN (hypertension), benign 05/22/2014  . DM type 2 (diabetes mellitus, type 2) (HCC) 05/22/2014   Past Medical History:  Past Medical History:  Diagnosis Date  . Anemia    in the past  . Asthma    " a long time ago"  . Bursitis   . Depression    13 years ago -   . Diabetes mellitus without complication (HCC)    type  2  . GERD (gastroesophageal reflux disease)    occ. acid reflux  . Headache    migraines  . Hypertension   . Kidney stones   . PFO (patent foramen ovale)    hx of PFO with surgery done as an adult  . Rotator cuff tear    right  . Stroke [facility]) 2009   lasdt stroke July 2015  . Syncope and collapse    Past Surgical History:  Past Surgical History:  Procedure Laterality Date  . ABDOMINAL HYSTERECTOMY    . BREAST REDUCTION SURGERY    . CARDIAC CATHETERIZATION    .  CARDIAC SURGERY  Closed hole in heart in 2009   Amplatzer Septal Occluder Ref [id number] (not listed in op notes, document scanned under MRI with written op notes  . HYSTEROTOMY    . IR GENERIC HISTORICAL  06/23/2016   IR ANGIO INTRA EXTRACRAN SEL COM CAROTID INNOMINATE BILAT MOD SED 06/23/2016 Julieanne Cotton, MD [facility]  . IR GENERIC HISTORICAL  06/23/2016   IR ANGIO VERTEBRAL SEL VERTEBRAL BILAT MOD SED 06/23/2016 Julieanne Cotton, MD [facility]  . IR GENERIC HISTORICAL  06/29/2016   IR RADIOLOGIST EVAL & MGMT 06/29/2016 [facility]  . IR GENERIC HISTORICAL  07/24/2016   IR INTRA CRAN STENT 07/24/2016 Julieanne Cotton, MD [facility]  . RADIOLOGY WITH ANESTHESIA [name] 07/24/2016   Procedure: Stenting;  Surgeon: Julieanne Cotton, MD;  [facility];  Service: Radiology;  Laterality: [name];  . RADIOLOGY WITH ANESTHESIA [name] 07/28/2016   Procedure: RADIOLOGY WITH ANESTHESIA;  Surgeon: Medication Radiologist, MD;  Location: [facility];  Service: Radiology;  Laterality: [name];  . SHOULDER ARTHROSCOPY WITH OPEN ROTATOR CUFF REPAIR AND DISTAL CLAVICLE ACROMINECTOMY Right 02/04/2015   Procedure: SHOULDER ARTHROSCOPY WITH SUBACROMIAL DECOMPRESSION AND OPEN ROTATOR CUFF REPAIR,;  Surgeon: Cammy Copa, MD;  [facility]: [facility];  Service: Orthopedics;  Laterality: Right;  RIGHT SHOULDER DOA,SAD,MINI-OPEN ROTATOR CUFF TEAR REPAIR.   HPI:  50 y/o  with PMH of anemia, depression, headaches, GERD, DM,  HTN, renal stones, syncope, PFO, CVA (2009) s/p repair who [facility] with reports of right sided weakness, slurred speech &difficulty swallowing. Per chart patient seen on Monday 07/24/16 for left MCA angioplasty with stent placement due to 95% stenosis and underwent a left CCA arteriogram followed by balloon angioplasty x3 for severe/near occlusive instent stenosis. MRI patchy multi focal acute ischemic predominantly cortical left MCA territory infarcts. She returned to ICU on mechanical  ventilation on 10/6 and extubated 07/29/16.   Assessment / Plan / Recommendation Clinical Impression  Pt demonstrated functional comprehension and expressive language functional/accurate for majority of utterances. She exhibited mild semantic paraphasias involving numbers (stating the correct year) however was able to write the year. Speech is moderately dysarthric marked by imprecise articulation and decreased volume. Pt would benefit from continued ST with home health.        SLP Assessment  Patient needs continued Speech Lanaguage Pathology Services    Follow Up Recommendations  Home health SLP    Frequency and Duration min 2x/week  2 weeks      SLP Evaluation Cognition  Overall Cognitive Status: Impaired/Different from baseline Arousal/Alertness: Awake/alert Orientation Level: Oriented X4 Attention: Sustained Sustained Attention: Impaired Sustained Attention Impairment: Verbal basic Memory:  (informally functional) Awareness: Impaired Awareness Impairment: Anticipatory impairment Problem Solving: Appears intact Safety/Judgment:  (questionable)       Comprehension  Auditory Comprehension Overall Auditory Comprehension: Appears within functional limits for tasks assessed Visual Recognition/Discrimination Discrimination: Not tested Reading Comprehension Reading Status: Not tested    Expression Expression Primary Mode of Expression: Verbal Verbal Expression Overall Verbal Expression: Impaired Initiation: No impairment Level of Generative/Spontaneous Verbalization: Conversation Repetition:  (NT) Naming: No impairment Pragmatics: No impairment Written Expression Dominant Hand: Right Written Expression: Not tested   Oral / Motor  Oral Motor/Sensory Function Overall Oral Motor/Sensory Function: Moderate impairment Facial ROM: Reduced right;Suspected CN VII (facial) dysfunction Facial Symmetry: Abnormal symmetry right;Suspected CN VII (facial) dysfunction Facial  Strength: Reduced right;Suspected CN VII (facial) dysfunction Lingual ROM: Within Functional Limits Lingual Symmetry: Abnormal symmetry right Lingual Strength: Reduced;Suspected CN XII (hypoglossal) dysfunction Velum: Within Functional Limits Mandible: Within Functional Limits Motor Speech Overall Motor Speech: Impaired Respiration: Within functional limits Phonation: Low vocal intensity Resonance: Within functional limits Articulation: Impaired Level of Impairment: Sentence Intelligibility: Intelligibility reduced Word: 75-100% accurate Phrase: 75-100% accurate Sentence: 75-100% accurate Conversation: 50-74% accurate Motor Planning: Witnin functional limits   GO                    Royce MacadamiaLitaker, Man Effertz Willis 07/30/2016, 1:59 PM  Breck CoonsLisa Willis Ivet Guerrieri M.Ed [employer] Pager [phone]

## 2016-07-30 NOTE — Discharge Summary (Signed)
Stroke Discharge Summary  Patient ID: Patricia [name]   MRN: [id number]      DOB: 1966/09/16  Date of Admission: 07/28/2016 Date of Discharge: 07/30/2016  Attending Physician:  No att. providers found, Stroke MD Consulting Physician(s):    pulmonary/intensive care and Interventional radiology  Patient's PCP:  Ralene Ok, MD  DISCHARGE DIAGNOSIS: Left middle cerebral artery territory infarcts secondary to a left middle cerebral artery stent thrombus. Active Problems:   Cerebrovascular accident (CVA) (HCC)   Acute respiratory failure (HCC)  BMI: Body mass index is 35.94 kg/m.  Past Medical History:  Diagnosis Date  . Anemia    in the past  . Asthma    " a long time ago"  . Bursitis   . Depression    13 years ago -   . Diabetes mellitus without complication (HCC)    type  2  . GERD (gastroesophageal reflux disease)    occ. acid reflux  . Headache    migraines  . Hypertension   . Kidney stones   . PFO (patent foramen ovale)    hx of PFO with surgery done as an adult  . Rotator cuff tear    right  . Stroke [facility]) 2009   lasdt stroke July 2015  . Syncope and collapse    Past Surgical History:  Procedure Laterality Date  . ABDOMINAL HYSTERECTOMY    . BREAST REDUCTION SURGERY    . CARDIAC CATHETERIZATION    . CARDIAC SURGERY  Closed hole in heart in 2009   Amplatzer Septal Occluder Ref [id number] (not listed in op notes, document scanned under MRI with written op notes  . HYSTEROTOMY    . IR GENERIC HISTORICAL  06/23/2016   IR ANGIO INTRA EXTRACRAN SEL COM CAROTID INNOMINATE BILAT MOD SED 06/23/2016 Julieanne Cotton, MD [facility]  . IR GENERIC HISTORICAL  06/23/2016   IR ANGIO VERTEBRAL SEL VERTEBRAL BILAT MOD SED 06/23/2016 Julieanne Cotton, MD [facility]  . IR GENERIC HISTORICAL  06/29/2016   IR RADIOLOGIST EVAL & MGMT 06/29/2016 [facility]  . IR GENERIC HISTORICAL  07/24/2016   IR INTRA CRAN STENT 07/24/2016 Julieanne Cotton, MD [facility]  . RADIOLOGY  WITH ANESTHESIA [name] 07/24/2016   Procedure: Stenting;  Surgeon: Julieanne Cotton, MD;  Location: [facility] OR;  Service: Radiology;  Laterality: [name];  . RADIOLOGY WITH ANESTHESIA [name] 07/28/2016   Procedure: RADIOLOGY WITH ANESTHESIA;  Surgeon: Medication Radiologist, MD;  Location: [facility] OR;  Service: Radiology;  Laterality: [name];  . SHOULDER ARTHROSCOPY WITH OPEN ROTATOR CUFF REPAIR AND DISTAL CLAVICLE ACROMINECTOMY Right 02/04/2015   Procedure: SHOULDER ARTHROSCOPY WITH SUBACROMIAL DECOMPRESSION AND OPEN ROTATOR CUFF REPAIR,;  Surgeon: Cammy Copa, MD;  Location: [facility] OR;  Service: Orthopedics;  Laterality: Right;  RIGHT SHOULDER DOA,SAD,MINI-OPEN ROTATOR CUFF TEAR REPAIR.      Medication List    STOP taking these medications   diphenhydramine-acetaminophen 25-500 MG Tabs tablet Commonly known as:  TYLENOL PM   fluconazole 150 MG tablet Commonly known as:  DIFLUCAN   gabapentin 250 MG/5ML solution Commonly known as:  NEURONTIN     TAKE these medications   aspirin EC 81 MG tablet Take 81 mg by mouth at bedtime.   BYSTOLIC 20 MG Tabs Generic drug:  Nebivolol HCl Take 20 mg by mouth every evening.   chlorthalidone 25 MG tablet Commonly known as:  HYGROTON Take 25 mg by mouth at bedtime.   lamoTRIgine 25 MG tablet Commonly known as:  LAMICTAL 25mg   Qhs x 2 wks, then 25mg  bid x 2 wks, then 25mg  am/50mg  pm x 2 wks, then 50mg  bid x 2 wks, then 50mg  am/100mg  pm x 2 wks, then 100mg  bid What changed:  how much to take  how to take this  when to take this  additional instructions   oxyCODONE-acetaminophen 10-325 MG tablet Commonly known as:  PERCOCET Take 1 tablet by mouth every 4 (four) hours as needed for pain.   pravastatin 40 MG tablet Commonly known as:  PRAVACHOL Take 40 mg by mouth at bedtime.   promethazine 25 MG tablet Commonly known as:  PHENERGAN Take 25 mg by mouth 3 (three) times daily as needed for nausea or vomiting.   SOLIQUA 100-33 UNT-MCG/ML Sopn Generic  drug:  Insulin Glargine-Lixisenatide INJECT 32 UNITS UNDER THE SKIN DAILY in the evening AS NEEDED FOR BLOOD SUGAR   SYNJARDY 12.02-999 MG Tabs Generic drug:  Empagliflozin-Metformin HCl Take 1 tablet by mouth 2 (two) times daily.   ticagrelor 90 MG Tabs tablet Commonly known as:  BRILINTA Take 1 tablet (90 mg total) by mouth 2 (two) times daily. What changed:  how much to take   tiZANidine 4 MG tablet Commonly known as:  ZANAFLEX Take 4 mg by mouth every 8 (eight) hours as needed (migraines).       LABORATORY STUDIES CBC    Component Value Date/Time   WBC 7.8 07/30/2016 0434   RBC 4.03 07/30/2016 0434   HGB 11.2 (L) 07/30/2016 0434   HCT 35.6 (L) 07/30/2016 0434   PLT 241 07/30/2016 0434   MCV 88.3 07/30/2016 0434   MCH 27.8 07/30/2016 0434   MCHC 31.5 07/30/2016 0434   RDW 13.3 07/30/2016 0434   LYMPHSABS 2.5 07/29/2016 0615   MONOABS 0.7 07/29/2016 0615   EOSABS 0.1 07/29/2016 0615   BASOSABS 0.0 07/29/2016 0615   CMP    Component Value Date/Time   NA 139 07/30/2016 0434   K 3.8 07/30/2016 0434   CL 109 07/30/2016 0434   CO2 23 07/30/2016 0434   GLUCOSE 85 07/30/2016 0434   BUN 6 07/30/2016 0434   CREATININE 0.99 07/30/2016 0434   CALCIUM 8.8 (L) 07/30/2016 0434   PROT 6.6 07/29/2016 0615   ALBUMIN 3.4 (L) 07/29/2016 0615   AST 25 07/29/2016 0615   ALT 23 07/29/2016 0615   ALKPHOS 114 07/29/2016 0615   BILITOT 0.4 07/29/2016 0615   GFRNONAA >60 07/30/2016 0434   GFRAA >60 07/30/2016 0434   COAGS Lab Results  Component Value Date   INR 0.90 07/28/2016   INR 0.97 07/24/2016   INR 1.00 07/13/2016   Lipid Panel    Component Value Date/Time   CHOL 121 07/29/2016 0615   TRIG 149 07/29/2016 0615   HDL 39 (L) 07/29/2016 0615   CHOLHDL 3.1 07/29/2016 0615   VLDL 30 07/29/2016 0615   LDLCALC 52 07/29/2016 0615   HgbA1C  Lab Results  Component Value Date   HGBA1C 7.2 (H) 07/29/2016   Cardiac Panel (last 3 results) No results for input(s):  CKTOTAL, CKMB, TROPONINI, RELINDX in the last 72 hours. Urinalysis    Component Value Date/Time   COLORURINE YELLOW 02/21/2015 0900   APPEARANCEUR CLEAR 02/21/2015 0900   LABSPEC 1.024 02/21/2015 0900   PHURINE 5.0 02/21/2015 0900   GLUCOSEU >1000 (A) 02/21/2015 0900   HGBUR NEGATIVE 02/21/2015 0900   BILIRUBINUR NEGATIVE 02/21/2015 0900   KETONESUR NEGATIVE 02/21/2015 0900   PROTEINUR NEGATIVE 02/21/2015 0900   UROBILINOGEN 0.2 02/21/2015 0900  NITRITE NEGATIVE 02/21/2015 0900   LEUKOCYTESUR NEGATIVE 02/21/2015 0900   Urine Drug Screen     Component Value Date/Time   LABOPIA NONE DETECTED 05/22/2014 1551   COCAINSCRNUR NONE DETECTED 05/22/2014 1551   LABBENZ NONE DETECTED 05/22/2014 1551   AMPHETMU NONE DETECTED 05/22/2014 1551   THCU NONE DETECTED 05/22/2014 1551   LABBARB NONE DETECTED 05/22/2014 1551    Alcohol Level    Component Value Date/Time   ETH <11 05/22/2014 1045     SIGNIFICANT DIAGNOSTIC STUDIES  MR Brain Wo Contrast  07/29/2016 1. Patchy multi focal acute ischemic predominantly cortical left MCA territory infarcts as above. Associated petechial hemorrhage without frank hemorrhagic transformation. No significant mass effect. 2. Mild chronic microvascular ischemic disease with additional scattered remote lacunar infarcts as above.    Ct Angio Head and Neck W Or Wo Contrast 07/28/2016 1. Possible nonocclusive thrombus in the left MCA stent. Abnormally prolonged transit throughout the left MCA superior division without evidence of acute core infarct or large vessel occlusion.  2. Chronically occluded or severely diseased A1 segments.  3. Mild right M1 MCA stenosis.     Ct Head Wo Contrast 07/28/2016 1. No acute intracranial abnormalities.  2. 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 Minor chronic microvascular ischemic change.  3. Left middle cerebral artery stent new since the prior study. No other change.      Ct  Cerebral Perfusion W Contrast 07/28/2016 1. Possible nonocclusive thrombus in the left MCA stent. Abnormally prolonged transit throughout the left MCA superior division without evidence of acute core infarct or large vessel occlusion.  2. Chronically occluded or severely diseased A1 segments.  3. Mild right M1 MCA stenosis.     Dg Chest Port 1 View 07/29/2016 Endotracheal tube and nasogastric tube well position. Mild atelectasis in the lower lungs persists.     Portable Chest Xray 07/28/2016 Endotracheal tube as above.  No evidence of acute airspace disease.    Dg Abd Portable 1v 07/28/2016 NG tube tip in the distal stomach. No evidence of bowel obstruction.    Ct Head Code Stroke Wo Contrast` 07/28/2016 Contrast staining in the left deep insula and left posterior frontal brain related to angiographic procedure. No other change.     CEREBRAL ANGIOGRAM [UJW1191 (Custom)] 07/28/2016 S/P lt CCA arteriogram followed by balloon angioplasty x 3 using 15mm x2 ,and x 1 2.25 mm x 15 mm Gateway balloon cathters for severe near occlusive instent stenosis.     HISTORY OF PRESENT ILLNESS Patricia [name] is an 50 y.o. female with history of multiple vascular risk factors including hypertension diabetes and multiple small left MCA stroke with minimal residual deficit came in 07/28/2016 with new left-sided weakness and dysarthria. Patient came to the emergency room around 7:20 AM on the day of admission. The neurologist was paged at 8:15 AM but code stroke was not activated. Finally code stroke was activated at 9:03 AM. Delay was because of communication and getting IV and CTA. This was considered as a wake up stroke. Patient noticed noticed some drooling on the left side around 9:30 PM the night prior to admission and went to sleep and woke up at 5 AM the day of admission with numbness and worsening left-sided weakness and dysarthria. Initial NIH score 6 with mild  left-sided weakness involving arm and leg left facial droop and numbness and dysarthria. Patient recently received a stent on 07/24/2016 in supraclenoid portion of ICH and proximal left M1 because patient had prior  history of multiple small left MCA stroke secondary to left M1 stenosis CT perfusion study performed 07/28/2016, the day of admission, which showed a significant perfusion mismatch suggesting large penumbra. The case was discussed with Dr Corliss Skains and after getting informed consent from the patient it was decided she'll be getting the angiogram for further management. Patient is already on a statin, aspirin 325 mg daily, and brilanta 45 mg twice a day, ( Plavix had not been effective in this patient ). The patient stated that she took her morning dose of medications that day.   HOSPITAL COURSE As noted this patient presented with new onset left-sided weakness and dysarthria 6 2017 after having undergone stent placement on 07/24/2016 in the supraclenoid portion of ICH and proximal left M1. Dr Corliss Skains was contacted and based on imaging studies the decision was made to proceed with a balloon angioplasty for severe near occlusive in-stent stenosis. Following the procedure the patient was admitted to the neuro intensive care unit. See Dr. Fatima Sanger report for complete details of the intervention.  [name] is a 50 y.o. female with history of cerebrovascular stenting on 07/24/2016, diabetes mellitus, syncope, previous stroke, PFO, hypertension, headaches, asthma, depression, and anemia presenting with new onset left-sided weakness and dysarthria -> IR for angiogram -> balloon angioplasty for severe near occlusive instent stenosis.  She did not receive IV t-PA due to unknown time of onset.  Stroke:  Non-dominant  infarct embolic secondary to a thrombus in the left middle cerebral artery stent.  Resultant  Mild right hemiplegia MRI  Patchy multi focal acute ischemic  predominantly cortical left MCA territory infarcts as above. Associated petechial hemorrhage without frank hemorrhagic transformation. No significant mass effect.  MRA - not performed  Carotid Doppler - CTA Neck  2D Echo - not performed  LDL - 52  HgbA1c - 7.4  VTE prophylaxis - SCDs  Diet Carb Modified Fluid consistency: Thin; Room service appropriate? Yes  Aspirin 81 mg daily and Brilinta prior to admission - changed to aspirin 81 mg daily and Brilinta 90 mg twice daily.  Ongoing aggressive stroke risk factor management  Therapy recommendations: Home health physical occupational and speech therapies were recommended  Disposition: home  Hypertension  Stable - current goal SBP 120 - 140              Permissive hypertension (OK if < 220/120) but gradually normalize in 5-7 days              Long-term BP goal normotensive  Hyperlipidemia  Home meds: Pravachol 40 mg daily prior to admission.  LDL 52, goal < 70  Continue statin at discharge  Diabetes  HgbA1c 7.4, goal < 7.0  Needs better glucose control  Other Stroke Risk Factors   ETOH use, advised to drink no more than 1 drink(s) a day  Obesity, Body mass index is 35.94 kg/m., recommend weight loss, diet and exercise as appropriate   Hx stroke/TIA   Other Active Problems  Mild anemia    DISCHARGE EXAM Blood pressure (!) 140/95, pulse 74, temperature 98.7 F (37.1 C), temperature source Oral, resp. rate 14, height 5' 5.5" (1.664 m), weight 99.5 kg (219 lb 4.8 oz), SpO2 100 %.   Gen. Sitting up in bed   Neck supple without any lymphadenopathy Lungs clear to auscultation CVS S1-S2 regular Skin no signs or scars or bruises Mental Status:  Awake alert oriented. Mild expressive aphasia with word finding difficulties and hesitancy. Good comprehension. Follows commands well.  Extraocular moments are full range without nystagmus. Mild right lower facial asymmetry. Tongue midline. No upper or lower  extremity drift but weakness of right grip and intrinsic hand muscles. Mild weakness of right hip flexors and ankle dorsiflexors..   Deep Tendon Reflexes: 2+ and symmetric throughout Plantars: Right: downgoingLeft: downgoing Cerebellar: Finger to nose intact Gait: Gait not assessed   Discharge Diet   Diet Carb Modified Fluid consistency: Thin; Room service appropriate? Yes liquids  DISCHARGE PLAN  Disposition:  Discharged to home in the care of her family.  Aspirin 81 mg daily and Brilinta 90 mg twice daily for secondary stroke prevention.  Ongoing risk factor control by Primary Care Physician at time of discharge  Follow-up MOREIRA,ROY, MD in 2 weeks.  Follow-up with Dr. [facility] in 6 weeks, office to schedule an appointment.  Follow-up Dr. Corliss Skains within 1-2 weeks. The patient was instructed to call for an appointment.  Home health physical, occupational, and speech therapies  Discharge instructions to the patient: 1. Gradually increase activity as tolerated. 2. No driving until cleared by a physician or therapist. 3. May return to work when cleared by a physician or therapist.  35 minutes were spent preparing discharge.  Delton See PA-C [facility] Pager [phone] 07/30/2016, 5:31 PM I have personally examined this patient, reviewed notes, independently viewed imaging studies, participated in medical decision making and plan of care.ROS completed by me personally and pertinent positives fully documented  I have made any additions or clarifications directly to the above note. Agree with note above.   Delia Heady, MD Medical Director [facility] Pager: [phone] 07/30/2016 7:12 PM

## 2016-07-30 NOTE — Care Management Note (Signed)
Case Management Note  Patient Details  Name: Patricia [name] MRN: [id number] Date of Birth: 1965/11/25  Subjective/Objective:                  Slurred speech, right sided weakness & difficulty swallowing Action/Plan: Discharge planning Expected Discharge Date:  07/29/16               Expected Discharge Plan:  [place]  In-House Referral:     Discharge planning Services  CM Consult  Post Acute Care Choice:  Home Health Choice offered to:  Patient  DME Arranged:  [name] DME Agency:  NA  HH Arranged:  PT, OT, Speech Therapy [place] Agency:  [place]  Status of Service:  Completed, signed off  If discussed at [place] of Stay Meetings, dates discussed:    Additional Comments: CM met with pt in room to offer choice of home health agency. Pt chooses [name] HH[facility]/OT/SLP.  Referral called to [facility] rep, Jermaine.  Pt denies need for any DME.  No other CM needs were communicated. Dellie Catholic, RN 07/30/2016, 3:13 PM

## 2016-07-30 NOTE — Progress Notes (Signed)
STROKE TEAM PROGRESS NOTE   HISTORY OF PRESENT ILLNESS (per record) Horton MarshallDutchess D Leatha GildingLivingston is an 50 y.o. female with history of multiple vascular risk factors including hypertension diabetes and multiple small left MCA strokes with minimal residual deficit came in with new left-sided weakness and dysarthria. Patient came to the emergency room around 7:20 AM Neurologist was paged at 8:15 AM but code stroke was not activated. Finally code stroke was activated at 9:03 AM. Delay was because of communication and getting IV and CTA. This is considered as a wake up stroke. Patient noticed noticed some drooling on the left side around 9:30 PM last night and went to sleep and woke up at 5 AM with numbness and worsening left-sided weakness and dysarthria. Initial NIH score 6 with mild left-sided weakness involving arm and leg left facial droop and numbness and dysarthria. Patient recently received a stent on 07/24/2016 in supraclenoid portion of ICH and proximal left M1 because patient had prior history of multiple small left MCA stroke secondary to left M1 stenosis CT perfusion study performed today significant perfusion mismatch suggesting large penumbra. Case was discussed with interventional radiologist and after getting informed consent from the patient it was decided she'll be getting the angiogram for further management. Patient is already on aspirin aspirin 325 mg daily, brilinta 45 mg twice a day, and statin therapy patient states that she took her morning dose of her medications   SUBJECTIVE (INTERVAL HISTORY)  Patient extubated y`day and is doing well.. No family present.  She has mild expressive aphasia and right-sided weakness. She is scheduled for swallow eval. She wants to go home   OBJECTIVE Temp:  [98.1 F (36.7 C)-99.2 F (37.3 C)] 98.7 F (37.1 C) (10/08 1209) Pulse Rate:  [70-85] 78 (10/08 1230) Cardiac Rhythm: Normal sinus rhythm (10/08 0800) Resp:  [10-25] 19 (10/08 1230) BP:  (93-160)/(66-105) 145/102 (10/08 1230) SpO2:  [96 %-100 %] 100 % (10/08 1230) Arterial Line BP: (138)/(111) 138/111 (10/07 2000)  CBC:   Recent Labs Lab 07/28/16 0732 07/29/16 0615 07/30/16 0434  WBC 8.6 8.9 7.8  NEUTROABS 5.1 5.6  --   HGB 12.5 12.3 11.2*  HCT 39.6 37.8 35.6*  MCV 89.2 86.3 88.3  PLT 248 259 241    Basic Metabolic Panel:   Recent Labs Lab 07/29/16 0615 07/30/16 0434  NA 141 139  K 3.8 3.8  CL 109 109  CO2 22 23  GLUCOSE 99 85  BUN 8 6  CREATININE 0.96 0.99  CALCIUM 9.2 8.8*    Lipid Panel:     Component Value Date/Time   CHOL 121 07/29/2016 0615   TRIG 149 07/29/2016 0615   HDL 39 (L) 07/29/2016 0615   CHOLHDL 3.1 07/29/2016 0615   VLDL 30 07/29/2016 0615   LDLCALC 52 07/29/2016 0615   HgbA1c:  Lab Results  Component Value Date   HGBA1C 7.4 (H) 07/13/2016   Urine Drug Screen:     Component Value Date/Time   LABOPIA NONE DETECTED 05/22/2014 1551   COCAINSCRNUR NONE DETECTED 05/22/2014 1551   LABBENZ NONE DETECTED 05/22/2014 1551   AMPHETMU NONE DETECTED 05/22/2014 1551   THCU NONE DETECTED 05/22/2014 1551   LABBARB NONE DETECTED 05/22/2014 1551      IMAGING   MR Brain Wo Contrast - pending    Ct Angio Head and Neck W Or Wo Contrast 07/28/2016 1. Possible nonocclusive thrombus in the left MCA stent. Abnormally prolonged transit throughout the left MCA superior division without evidence of acute core  infarct or large vessel occlusion.  2. Chronically occluded or severely diseased A1 segments.  3. Mild right M1 MCA stenosis.     Ct Head Wo Contrast 07/28/2016 1. No acute intracranial abnormalities.  2. 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 Minor chronic microvascular ischemic change.  3. Left middle cerebral artery stent new since the prior study. No other change.      Ct Cerebral Perfusion W Contrast 07/28/2016 1. Possible nonocclusive thrombus in the left MCA stent. Abnormally prolonged transit  throughout the left MCA superior division without evidence of acute core infarct or large vessel occlusion.  2. Chronically occluded or severely diseased A1 segments.  3. Mild right M1 MCA stenosis.     Dg Chest Port 1 View 07/29/2016 Endotracheal tube and nasogastric tube well position. Mild atelectasis in the lower lungs persists.     Portable Chest Xray 07/28/2016 Endotracheal tube as above.  No evidence of acute airspace disease.    Dg Abd Portable 1v 07/28/2016 NG tube tip in the distal stomach. No evidence of bowel obstruction.    Ct Head Code Stroke Wo Contrast` 07/28/2016 Contrast staining in the left deep insula and left posterior frontal brain related to angiographic procedure. No other change.     CEREBRAL ANGIOGRAM [ZOX0960 (Custom)] 07/28/2016 S/P lt CCA arteriogram followed by balloon angioplasty x 3 using 15mm x2 ,and x 1 2.25 mm x 15 mm Gateway balloon cathters for severe near occlusive instent stenosis.     PHYSICAL EXAM  Gen. Sitting up in bed   Neck supple without any lymphadenopathy Lungs clear to auscultation CVS S1-S2 regular Skin no signs or scars or bruises Mental Status:  Awake alert oriented. Mild expressive aphasia with word finding difficulties and hesitancy. Good comprehension. Follows commands well. Extraocular moments are full range without nystagmus. Mild right lower facial asymmetry. Tongue midline. No upper or lower extremity drift but weakness of right grip and intrinsic hand muscles. Mild weakness of right hip flexors and ankle dorsiflexors..   Deep Tendon Reflexes: 2+ and symmetric throughout Plantars: Right: downgoing                                Left: downgoing Cerebellar: Finger to nose intact Gait: Gait not assessed     ASSESSMENT/PLAN Ms. EVELIA WASKEY is a 50 y.o. female with history of cerebrovascular stenting on 07/24/2016, diabetes mellitus, syncope, previous stroke, PFO, hypertension, headaches, asthma,  depression, and anemia presenting with new onset left-sided weakness and dysarthria -> IR for angiogram -> balloon angioplasty for severe near occlusive instent stenosis.   She did not receive IV t-PA due to unknown time of onset.  Stroke:  Non-dominant  infarct embolic secondary to a thrombus in the left middle cerebral artery stent.  Resultant  Mild right hemiplegia MRI  Patchy multi focal acute ischemic predominantly cortical left MCA territory infarcts as above. Associated petechial hemorrhage without  frank hemorrhagic transformation. No significant mass effect.  MRA  Not done  Carotid Doppler - CTA Neck  2D Echo - not ordered  LDL - 52  HgbA1c - 7.4  VTE prophylaxis - SCDs Diet Carb Modified Fluid consistency: Thin; Room service appropriate? Yes  aspirin 81 mg daily and Brilinta prior to admission, now on aspirin 325 mg daily and Brilinta 45 mg twice daily.  Ongoing aggressive stroke risk factor management  Therapy recommendations: Home OT/ST  Disposition: home  Hypertension  Stable -  current goal SBP 120 - 140  Permissive hypertension (OK if < 220/120) but gradually normalize in 5-7 days  Long-term BP goal normotensive  Hyperlipidemia  Home meds: Pravachol 40 mg daily, not resumed in hospital  LDL 52, goal < 70  Restart statin when taking POs.  Continue statin at discharge  Diabetes  HgbA1c 7.4, goal < 7.0  Uncontrolled  Other Stroke Risk Factors   ETOH use, advised to drink no more than 1 drink(s) a day  Obesity, Body mass index is 35.94 kg/m., recommend weight loss, diet and exercise as appropriate   Hx stroke/TIA   Other Active Problems    Hospital day # 2  Delton See PA-C [facility] Pager [phone] 07/30/2016, 1:29 PM I have personally examined this patient, reviewed notes, independently viewed imaging studies, participated in medical decision making and plan of care.ROS completed by me personally and pertinent  positives fully documented  I have made any additions or clarifications directly to the above note. Agree with note above. Patient had elective left MCA angioplasty and stenting 2 days ago and presented with recurrent ischemic symptoms and underwent emergent angioplasty for near occlusive stenosis in side the stent. She has failed aspirin and Brilinta and had shown allergy to Plavix. I will discuss the case with Dr. Corliss Skains and recommend increasing dose of Brilinta to 90 mg twice daily  As the patient appears to be at high risk for stent thrombosis and restenosis in the future.   Discuss with Dr. Corliss Skains    Likely discharge home later today after therapy evaluation. Follow-up as an outpatient with Dr. Corliss Skains in a few weeks and in the stroke clinic in 6 weeks with Dr Roda Shutters. Patient counseled to be compliant with her medication regimen aggressive risk factor control.    Delia Heady, MD Medical Director [facility] Pager: [phone] 07/30/2016 1:29 PM   To contact Stroke Continuity provider, please refer to [email or web address]. After hours, contact General Neurology

## 2016-07-30 NOTE — Evaluation (Addendum)
Occupational Therapy Evaluation Patient Details Name: Patricia [name] MRN: [id number] DOB: 1965-12-19 Today's Date: 07/30/2016    History of Present Illness 50 y/o F with PMH of anemia, depression, headaches, GERD, DM, HTN, renal stones, syncope, PFO, CVA (2009) s/p repair who presented to [facility] on am of 10/6 with reports of right sided weakness, slurred speech & difficulty swallowing.  The patient was seen on Monday 07/24/16 for left MCA angioplasty with stent placement due to 95% stenosis.  She underwent a left CCA arteriogram followed by balloon angioplasty x3 for severe/near occlusive instent stenosis.  Pt returned to ICU on mechanical ventilation.  She was extubated 07/29/16.MRI: Patchy multi focal acute ischemic predominantly cortical left MCA territory infarcts as above. Mild chronic microvascular ischemic disease with additional scattered remote lacunar infarcts as above   Clinical Impression   This 50 yo female admitted with above presents to acute OT with deficits below (see OT problem list) thus affecting her PLOF of totally independent with basic ADLs, IADLs, and working as a CMA. She will continue to benefit from acute OT with follow up HHOT.    Follow Up Recommendations  Home health OT;Supervision/Assistance - 24 hour    Equipment Recommendations  Other (comment) (friend may get her a tub seat if it is felt she needs on after she gets home)       Precautions / Restrictions Precautions Precautions: Fall Restrictions Weight Bearing Restrictions: No      Mobility Bed Mobility Overal bed mobility: Independent                Transfers Overall transfer level: Needs assistance Equipment used: None Transfers: Sit to/from Stand Sit to Stand: Supervision         General transfer comment: Pt ambulated around the unit x2 without LOB and looking all around as she walked         ADL Overall ADL's : Needs assistance/impaired Eating/Feeding: Supervision/  safety Eating/Feeding Details (indicate cue type and reason): pt tries to use her RUE to A, but when she gets frustrated she resorts to just using her LUE Grooming: Supervision/safety Grooming Details (indicate cue type and reason): pt tries to use her RUE to A, but when she gets frustrated she resorts to just using her LUE Upper Body Bathing: Supervision/ safety Upper Body Bathing Details (indicate cue type and reason): pt tries to use her RUE to A, but when she gets frustrated she resorts to just using her LUE Lower Body Bathing: Supervison/ safety;Sit to/from stand Lower Body Bathing Details (indicate cue type and reason): pt tries to use her RUE to A, but when she gets frustrated she resorts to just using her LUE Upper Body Dressing : Supervision/safety Upper Body Dressing Details (indicate cue type and reason): pt tries to use her RUE to A, but when she gets frustrated she resorts to just using her LUE Lower Body Dressing: Supervision/safety;Sit to/from stand Lower Body Dressing Details (indicate cue type and reason): pt tries to use her RUE to A, but when she gets frustrated she resorts to just using her LUE Toilet Transfer: Supervision/safety;Ambulation;Regular Toilet   Toileting- Architect and Hygiene: Supervision/safety;Sit to/from stand         General ADL Comments: increased time and effort due to trying to use RUE (which she should be doing, and I told her she needs to keep trying to use,use, use it). Pt attempted to use her RUE 25-50% of time.     Vision Vision Assessment?: Yes Eye  Alignment: Within Functional Limits Ocular Range of Motion: Within Functional Limits Alignment/Gaze Preference: Within Defined Limits Tracking/Visual Pursuits: Able to track stimulus in all quads without difficulty Saccades: Within functional limits Convergence: Within functional limits Visual Fields: No apparent deficits          Pertinent Vitals/Pain Pain Assessment: No/denies  pain     Hand Dominance Right   Extremity/Trunk Assessment Upper Extremity Assessment Upper Extremity Assessment: RUE deficits/detail RUE Deficits / Details: AROM WNL but movements are slower compared to LUE even though this is her dominant UE, she can raise her arm up over her head, she can touch her hand to nose with shoulder at 90 degrees, difficulty with palm up/down with speed, can close hand but grip is weak RUE Sensation: decreased light touch;decreased proprioception RUE Coordination: decreased fine motor;decreased gross motor           Communication Communication Communication: Expressive difficulties   Cognition Arousal/Alertness: Awake/alert Behavior During Therapy: WFL for tasks assessed/performed Overall Cognitive Status: Impaired/Different from baseline Area of Impairment: Safety/judgement         Safety/Judgement: Decreased awareness of safety (and how her deficits affect her being able to safely and independently perfrom her basic and IADLs)            Exercises   Other Exercises Other Exercises: Reaching up to shake hands, giving high 5's, folding washcloths, hanging clothes on hangers, switching objects from one hand to another, just using-using -using her RUE for all of her basic ADL and IADL tasks (not cooking or driving thought)        Home Living Family/patient expects to be discharged to:: Private residence Living Arrangements: Children Available Help at Discharge: Family;Friend(s);Available 24 hours/day (her best friend reports she can stay with her 24/7 (pt is not very receptive to this)) Type of Home: House Home Access: Stairs to enter Entergy CorporationEntrance Stairs-Number of Steps: 3 Entrance Stairs-Rails: None Home Layout: Able to live on main level with bedroom/bathroom     Bathroom Shower/Tub: Tub/shower unit;Curtain Shower/tub characteristics: Curtain       Home Equipment: None          Prior Functioning/Environment Level of Independence:  Independent        Comments: works at [employer]        OT Problem List: Decreased strength;Decreased range of motion;Impaired tone;Obesity;Impaired sensation;Impaired UE functional use   OT Treatment/Interventions:      OT Goals(Current goals can be found in the care plan section) Acute Rehab OT Goals Patient Stated Goal: to go home alone  OT Frequency:                End of Session Equipment Utilized During Treatment:  (none) Nurse Communication:  (recommend HHOT with 24 hour S/prn A)  Activity Tolerance: Patient tolerated treatment well Patient left: in chair;with call bell/phone within reach;with family/visitor present   Time: [id number] OT Time Calculation (min): 42 min Charges:  OT General Charges $OT Visit: 1 Procedure OT Evaluation $OT Eval Moderate Complexity: 1 Procedure OT Treatments $Self Care/Home Management : 8-22 mins $Therapeutic Exercise: 8-22 mins  Evette GeorgesLeonard, Israella Hubert Eva [phone] 07/30/2016, 1:41 PM

## 2016-08-01 ENCOUNTER — Encounter (HOSPITAL_COMMUNITY): Payer: Self-pay | Admitting: Interventional Radiology

## 2016-08-07 ENCOUNTER — Ambulatory Visit (HOSPITAL_COMMUNITY)
Admission: RE | Admit: 2016-08-07 | Discharge: 2016-08-07 | Disposition: A | Discharge status: Home / Self Care | Payer: BLUE CROSS/BLUE SHIELD | Source: Ambulatory Visit | Attending: Radiology | Admitting: Radiology

## 2016-08-07 ENCOUNTER — Other Ambulatory Visit: Payer: Self-pay | Admitting: General Surgery

## 2016-08-07 DIAGNOSIS — I639 Cerebral infarction, unspecified: Secondary | ICD-10-CM

## 2016-08-07 DIAGNOSIS — I6602 Occlusion and stenosis of left middle cerebral artery: Secondary | ICD-10-CM

## 2016-08-07 HISTORY — PX: IR GENERIC HISTORICAL: IMG1180011

## 2016-08-09 ENCOUNTER — Other Ambulatory Visit: Payer: Self-pay | Admitting: Physician Assistant

## 2016-08-09 ENCOUNTER — Encounter (HOSPITAL_COMMUNITY): Payer: Self-pay | Admitting: Interventional Radiology

## 2016-08-09 DIAGNOSIS — I639 Cerebral infarction, unspecified: Secondary | ICD-10-CM

## 2016-08-09 LAB — PLATELET INHIBITION P2Y12: Platelet Function  P2Y12: 168 [PRU] — ABNORMAL LOW (ref 194–418)

## 2016-08-23 ENCOUNTER — Encounter: Payer: Self-pay | Admitting: Neurology

## 2016-08-23 ENCOUNTER — Ambulatory Visit (INDEPENDENT_AMBULATORY_CARE_PROVIDER_SITE_OTHER): Payer: BLUE CROSS/BLUE SHIELD | Admitting: Neurology

## 2016-08-23 VITALS — BP 140/91 | HR 84 | Ht 65.0 in | Wt 213.0 lb

## 2016-08-23 DIAGNOSIS — F329 Major depressive disorder, single episode, unspecified: Secondary | ICD-10-CM

## 2016-08-23 DIAGNOSIS — G44221 Chronic tension-type headache, intractable: Secondary | ICD-10-CM | POA: Diagnosis not present

## 2016-08-23 DIAGNOSIS — I63512 Cerebral infarction due to unspecified occlusion or stenosis of left middle cerebral artery: Secondary | ICD-10-CM

## 2016-08-23 DIAGNOSIS — I6602 Occlusion and stenosis of left middle cerebral artery: Secondary | ICD-10-CM | POA: Diagnosis not present

## 2016-08-23 DIAGNOSIS — F32A Depression, unspecified: Secondary | ICD-10-CM | POA: Insufficient documentation

## 2016-08-23 MED ORDER — SERTRALINE HCL 50 MG PO TABS: 50.0000 mg | ORAL_TABLET | Freq: Every day | ORAL | 3 refills | Status: DC

## 2016-08-23 NOTE — Progress Notes (Addendum)
STROKE NEUROLOGY FOLLOW UP NOTE  NAME: Patricia [name] DOB: Mar 14, 1966  REASON FOR VISIT: stroke follow up HISTORY FROM: pt and chart  Today we had the pleasure of seeing Patricia [name] in follow-up at our Neurology Clinic. Pt was accompanied by daughter and granddaughter.   History Summary 50 y.o. African American female with PMH of obesity, hypertension, diabetes, hyperlipidemia, previous embolic stroke in 2009 presumably due to PFO/ASD state post of closure by Dr. Jacinto Halim was admitted on 05/22/14 for right arm and leg numbness weakness, difficulty with speech. MRI had showed left MCA territory small multiple infarcts. MRA head showed significant intracranial stenosis. She was discharged on aspirin and Plavix dural antiplatelet therapy and outpatient PT/OT.  She had a previous CVA in 2009, MRI showed multiple bilateral small infarcts consistent with cardioembolic stroke. She was found by PFO/ASD, state post of a PFO closure with Dr. Jacinto Halim. She stated she was treated with aspirin and Plavix for some time and then Dr. Jacinto Halim took her off Plavix. She had no residual deficit left from previous stroke.  She had multiple stroke risk factors including hypertension, hyperlipidemia, diabetes, obesity. She stated that she was not a compliant with medication in the past and he now watch for her diet.  She denies any cigarette smoking, alcohol drinking, or illicit drugs.  06/17/14 follow up -  the patient has been doing well. She still not able to write with right hand, and she is getting PT/OT 2/wk. Her BP at home was OK, today in clinic 146/93. Her BP has been low lately so Dr. Jacinto Halim decreased her bystolic. She saw her PCP early this month and changed her from plavix to crestor due to questionable rashes. Continue her on PO meds for DM and Sugar this morning 117.   08/27/14 follow up - she was doing well. Her BP today in clinic was 115/80 and she stated that her sugar level at home was 113,  190, 130 for the last 3 days. Her CTA head and neck was stable comparing with prior, still showing left MCA M1 high grade stenosis as well as bilateral ACA stenosis with diffuse intracranial atherosclerosis. Vasculitis work up negative. TCD bubble study consistent with PFO s/p closure. She has allergic reaction to plavix, so PCP put her on ASA 81mg  bid and cilostazol for dural antiplatelet.  However, she complained in clinic today about her headache. She had headache for the last 30 years, the pain are at the back of her head at skull base line, with pain at the back of right eye. 2-3 per month, usually lasting for 2 days. 10/10, phonophobia but no photophobia, nausea but no vomiting. No neck pain. However, for the last 3 weeks, she has similar headache not go away. She takes percocet and phernagen but not effective.  09/07/15 follow up - pt has been doing the same. She had one episode of right arm and leg weakness, word finding difficulty after a long day (work that day, rushed to pick up her grandson for football game, restaurant to eat and drove back). She was admitted to OSH, CT head and MRI showed no acute abnormalities. Normal TTE. She was discharged with resolution of symptoms, likely due to recrudescence of previous stroke.   She still complains of headache with neck pain, consistent with occipital neuralgia. She had occipital nerve block last visit, but she said that was not very effective. She was also on amitriptyline for the headache but she stopped it as it was  also not effective. Currently she was put on fioricet and percocet for the headache with aware of rebound headache. I recommended to her pain management referral to consider radioablation of occipital nerve.   03/23/16 follow up - pt has been doing fine from stroke standpoint. She recently complains that she has the episodes of difficulty speaking, getting words out, on and off, increasingly frequent. She has similar episodes 07/2015 and  MRI negative for stroke. She again had another MRI 12/2015 and negative for stroke. Her symptoms are likely recrudescence from her previous strokes in the setting of fatigue, stress, anxiety, depression, infection and etc. However, she stated that the episodes happened randomly, not associated with above triggers. She also denies association with HA or stress. However, she stated that the episodes seem to happen when her BP was low, as low as 100/60. Currently in clinic, she has no deficit and still in depressed mood.  She also complains of HA which has been going on for long time. She has daily HA, has tried inderal, depakote, amitriptyline, topamax, imitrex, botox but none of them worked either due to side effects or ineffectiveness. She also had LP for CSF drainage due to ? Increased intracranial pressure, but still not effective. She denies tension type HA.   Interval History During the interval time, pt had EEG for speech difficulty episode work up which was negative. However, pt started to feel right hand weakness and intermittent word finding difficulties in 05/2016. Repeat MRI showed punctate left MCA stroke, same distribution as her stroke 2 years ago. She does have high grade stenosis of left M1, but denies any low BP or dehydration. Due to recurrent stroke at same territory of high grade stenosis of left M1, she was referred to Dr. Corliss Skains for consideration of intracranial stent. She had left MCA stent on 07/24/16, and put on ASA and brilinta. However, she was readmitted on 07/28/16 for left sided weakness and dysarthria. CTA head and neck showed nonocclusive thrombus in the left MCA stent. MRI showed patchy left MCA cortical infarcts. Pt underwent angioplasty for in-stent stenosis. LDL 52 and A1C 7.4. She was continued on ASA and brilinta and pravastatin on discharge.  Currently still has right facial droop and mild right hand dexterity difficulty, not able to write well. Low energy level and easily  tired. Finished home PT/OT and on home exercise now.   She still complains of HA since last week. In the past has tried topamax but stopped due to cognitive issue. Has tried inderal long time ago, not effective and currently she is on multiple BP meds including beta bloker. She has tried keppra, depakote, neurontin and amitriptyline all not effective. Like to avoid imitrex due to stroke. Has tried occipital nerve block but not effective. On lamictal 100mg  bid now. Had botox for HA but not effective also.   REVIEW OF SYSTEMS: Full 14 system review of systems performed and notable only for those listed below and in HPI above, all others are negative:   Restless leg, HA, speech difficulty, weakness, facial drooping  The following represents the patient's updated allergies and side effects list: Allergies  Allergen Reactions  . Imitrex [Sumatriptan] Anaphylaxis  . Plavix [Clopidogrel Bisulfate] Hives  . Topamax [Topiramate]     Memory issues  . Lipitor [Atorvastatin] Rash    Labs since last visit of relevance include the following: Results for orders placed or performed in visit on 08/09/16  Platelet inhibition p2y12 (Not at [facility])  Result Value Ref Range  Platelet Function  [name] 168 (L) 194 - 418 PRU    The neurologically relevant items on the patient's problem list were reviewed on today's visit.  Neurologic Examination  A problem focused neurological exam (12 or more points of the single system neurologic examination, vital signs counts as 1 point, cranial nerves count for 8 points) was performed.  Blood pressure (!) 140/91, pulse 84, height 5\' 5"  (1.651 m), weight 213 lb (96.6 kg).  General - obese, well developed, depressed mood.  Ophthalmologic - Sharp disc margins OU.  Cardiovascular - Regular rate and rhythm with no murmur.  Mental Status -  Level of arousal and orientation to time, place, and person were intact. Language including expression, naming, repetition,  comprehension, reading, and writing was assessed and found intact.  Cranial Nerves II - XII - II - Visual field intact OU. III, IV, VI - Extraocular movements intact. V - Facial sensation intact bilaterally. VII - Facial movement intact bilaterally. VIII - Hearing & vestibular intact bilaterally. X - Palate elevates symmetrically. XI - Chin turning & shoulder shrug intact bilaterally. XII - Tongue protrusion intact.  Motor Strength - The patient's strength was normal in all extremities except right UE distal 4+/5, with decreased right hand dexterity and right pronator drift.  Bulk was normal and fasciculations were absent.   Motor Tone - Muscle tone was assessed at the neck and appendages and was normal.  Reflexes - The patient's reflexes were normal extremities and she had no pathological reflexes.  Sensory - Light touch, temperature/pinprick and Romberg testing were assessed and were normal.    Coordination - The patient had normal movements in the hands and feet with no ataxia or dysmetria.  Tremor was absent.  Gait and Station - The patient's transfers, posture, gait, station, and turns were observed as normal.  Data reviewed: I personally reviewed the images and agree with the radiology interpretations.  Mr Shirlee Latch Wo Contrast  05/22/2014  Multiple acute infarctions in the left hemisphere that could be a combination of watershed infarctions and micro embolic infarctions.   MRA  05/22/2014  Advanced intracranial disease with very limited flow and the anterior cerebral artery territories and multiple middle cerebral stenoses, particularly severe and notable at the M1 segment on the left with the vessel shows 90% stenosis. Distal branch vessels appear reduced in number an show irregularity. The findings could be due to widespread advanced atherosclerotic disease or vasculitis. This represents a marked worsening of disease since 2009. Old infarctions affecting the thalami,  hemispheric white matter, genu of the corpus callosum and right parietal cortex at the vertex.   Dg Chest Port 1 View  05/22/2014  There is no active cardiopulmonary disease.   Carotid Doppler Preliminary report: 1-39% ICA stenosis. Vertebral artery flow is antegrade.  2D Echocardiogram Left ventricle: The cavity size was at the upper limits ofnormal. Systolic function was normal.  The estimated ejection fraction was in the range of 55% to 60%. Wall motion was normal; there were no regional wall motion abnormalities. - Atrial septum: There is no color Doppler e/o residual inter atrial shunting. Flow present within the right atrial disk without crossing the septum (normal).  An Amplatzer closure device in the fossa ovalis region was present.  There was no atrial level shunt. No evidence of thrombus.  CTA head and neck 07/03/14 1. Severe proximal left MCA stenosis. Mild proximal right MCA stenosis. Bilateral MCA branch vessel irregularity and attenuation. Occlusion of the anterior cerebral arteries at their  origins with some reconstituted but severely diminished flow distally. Mild left P2 stenosis. Considerations again include advanced atherosclerosis and vasculitis. 2. No evidence of cervical carotid or vertebral artery stenosis.  TCD bubble study 07/06/14 Both middle cerebral arteries could not be insonated using a headset due to poor bitemporal windows and procedure was done insonating left vertebral artery with a hand held probe. And IV line was inserted in the left elbow by the RN using aseptic precautions. Agitated saline injection at rest and after valsalva maneuver did result in several high intensity transient signals (HITS).  Impression: Positive Transcranial Doppler Bubble Study indicative of small residual right to left intracardiac shunt despite endovascular PFO closure.   MRI and MRA head 02/2015 -  MRI HEAD: No acute intracranial process, specifically no acute Infarct.  Multifocal small areas of encephalomalacia corresponding to prior LEFT cerebral remote infarcts. Remote bilateral basal ganglia and thalamus lacunar infarcts. Moderate white matter changes suggest chronic small vessel ischemic disease.  MRA HEAD: Focal high-grade stenosis of LEFT M1 segment, stable. Chronically occluded RIGHT anterior cerebral artery, with thready irregular LEFT anterior cerebral artery, unchanged. Thready, somewhat narrowed basilar artery, worse than prior examination though this may be accentuated by motion artifact. Moderate stenosis LEFT P2 segment, worse than prior imaging. Improved appearance of the LEFT middle cerebral artery from prior imaging, suggesting prior vasculitis.  MRI brain 01/05/16 No acute or subacute infarction. Old infarctions affecting the thalami, basal ganglia, genu of the corpus callosum, hemispheric white matter and left and right cortical and subcortical infarctions at the vertex.  EEG 05/23/16 -  Normal EEG in the awake and drowsy state.  MR Brain Wo Contrast  07/29/2016 1. Patchy multi focal acute ischemic predominantly cortical left MCA territory infarcts as above. Associated petechial hemorrhage without frank hemorrhagic transformation. No significant mass effect. 2. Mild chronic microvascular ischemic disease with additional scattered remote lacunar infarcts as above.  Ct Angio Head and Neck W Or Wo Contrast 07/28/2016 1.Possible nonocclusive thrombus in the left MCA stent.Abnormally prolonged transit throughout the left MCA superior division without evidence of acute core infarct or large vessel occlusion.  2. Chronically occluded or severely diseased A1 segments.  3. Mild right M1 MCA stenosis.   Ct Head Wo Contrast 07/28/2016 1. No acute intracranial abnormalities.  2. 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 Minor chronic microvascular ischemic change.  3. Left middle cerebral artery stent new since the prior  study. No other change.   Ct Cerebral Perfusion W Contrast 07/28/2016 1. Possible nonocclusive thrombus in the left MCA stent. Abnormally prolonged transit throughout the left MCA superior division without evidence of acute core infarct or large vessel occlusion.  2. Chronically occluded or severely diseased A1 segments.  3. Mild right M1 MCA stenosis.   Component     Latest Ref Rng 05/22/2014 05/23/2014 06/17/2014  PTT Lupus Anticoagulant     28.0 - 43.0 secs 32.0    PTTLA Confirmation     <8.0 secs NOT APPL    PTTLA 4:1 Mix     28.0 - 43.0 secs NOT APPL    DRVVT     <42.9 secs 36.1    Drvvt confirmation     <1.15 Ratio NOT APPL    dRVVT Incubated 1:1 Mix     <42.9 secs NOT APPL    Lupus Anticoagulant     NOT DETECTED NOT DETECTED    ENA RNP Ab     0.0 - 0.9 AI   <0.2  ENA  SM Ab Ser-aCnc     0.0 - 0.9 AI   <0.2  Rhuematoid fact SerPl-aCnc     0.0 - 13.9 IU/mL   8.3  Chromatin Ab SerPl-aCnc     0.0 - 0.9 AI   <0.2  ENA SSA (RO) Ab     0.0 - 0.9 AI   0.8  ENA SSB (LA) Ab     0.0 - 0.9 AI   0.2  dsDNA Ab     0 - 9 IU/mL   <1  Cholesterol     0 - 200 mg/dL  161   Triglycerides     <150 mg/dL  096   HDL     >04 mg/dL  38 (L)   Total CHOL/HDL Ratio       4.7   VLDL     0 - 40 mg/dL  23   LDL (calc)     0 - 99 mg/dL  540 (H)   Interpretation-PTGENE:      REPORT    Recommendations-PTGENE:      REPORT    Reviewer      REPORT    Beta-2 Glyco I IgG     <20 G Units 0    Beta-2-Glycoprotein I IgM     <20 M Units 1    Beta-2-Glycoprotein I IgA     <20 A Units 27 (H)    Interpretation-F5LEID:      REPORT    Recommendations-F5LEID:      REPORT    Reviewer      REPORT    Anticardiolipin IgG     <23 GPL U/mL 8 (L)    Anticardiolipin IgM     <11 MPL U/mL 0 (L)    Anticardiolipin IgA     <22 APL U/mL 7 (L)    C-ANCA     Neg:<1:20 titer   <1:20  P-ANCA     Neg:<1:20 titer   <1:20  Atypical pANCA     Neg:<1:20 titer   <1:20  Hgb A1c MFr Bld     <5.7 %  8.6 (H)    Mean Plasma Glucose     <117 mg/dL  981 (H)   Protein C Activity     75 - 133 % 154 (H)    Protein C, Total     72 - 160 % 135    Protein S Activity     69 - 129 % 51 (L)    Protein S Ag, Total     60 - 150 % 64    AntiThromb III Func     75 - 120 % 95    Homocysteine     4.0 - 15.4 umol/L 11.7    C3 Complement     90 - 180 mg/dL  191   Complement C4, Body Fluid     10 - 40 mg/dL  42 (H)   Compl, Total (CH50)     31 - 60 U/mL  >60 (H)   Sed Rate     0 - 22 mm/hr  18   ANA Ser Ql     NEGATIVE  NEGATIVE   RPR     NON REAC  NON REAC   HIV     NONREACTIVE  NONREACTIVE   Sickle Cell Prep     Negative   Negative   Component     Latest Ref Rng & Units 02/21/2015 07/03/2016 07/07/2016 07/13/2016  Cholesterol     0 - 200 mg/dL  141     Triglycerides     <150 mg/dL 657142     HDL Cholesterol     >40 mg/dL 29 (L)     Total CHOL/HDL Ratio     RATIO 4.9     VLDL     0 - 40 mg/dL 28     LDL (calc)     0 - 99 mg/dL 84     Hemoglobin Q4OA1C     4.8 - 5.6 %    7.4 (H)  Mean Plasma Glucose     mg/dL    962166  Platelet Function  [name]     194 - 418 PRU  2 (L) 21 (L) 9 (L)   Component     Latest Ref Rng & Units 07/17/2016 07/21/2016 07/24/2016 07/25/2016  Cholesterol     0 - 200 mg/dL      Triglycerides     <150 mg/dL      HDL Cholesterol     >40 mg/dL      Total CHOL/HDL Ratio     RATIO      VLDL     0 - 40 mg/dL      LDL (calc)     0 - 99 mg/dL      Hemoglobin X5MA1C     4.8 - 5.6 %      Mean Plasma Glucose     mg/dL      Platelet Function  [name]     194 - 418 PRU 100 (L) 118 (L) 20 (L) 97 (L)   Component     Latest Ref Rng & Units 07/28/2016 07/29/2016  Cholesterol     0 - 200 mg/dL  841121  Triglycerides     <150 mg/dL  324149  HDL Cholesterol     >40 mg/dL  39 (L)  Total CHOL/HDL Ratio     RATIO  3.1  VLDL     0 - 40 mg/dL  30  LDL (calc)     0 - 99 mg/dL  52  Hemoglobin M0NA1C     4.8 - 5.6 %  7.2 (H)  Mean Plasma Glucose     mg/dL  027160  Platelet Function  [name]      194 - 418 PRU 116 (L)     Assessment: As you may recall, she is a 50 y.o. AA  female with PMH of obesity, hypertension, diabetes, hyperlipidemia, previous embolic stroke in 2009 presumably due to PFO/ASD s/p closure by Dr. Jacinto HalimGanji was admitted in 04/2014 for new left MCA territory multiple small strokes. She had cardioembolic stroke in 2009 and it attributed to PFO at the time. The current stroke was confined in the left MCA territory. She does have intracranial stenosis more prominent at left MCA M1 and bilateral ACA which has significant progress from her CTA head and neck in 2009. She does have multiple stroke risk factors including HTN, HLD, and uncontrolled DM. Repeat CTA no change from prior. TCD bubble study consistent with PFO s/p closure. Vasculitis and hypercoagulable work up negative. Finished dural antiplatelet, and kept on ASA 325mg  as she has allergic reaction to plavix.   During the interval time, pt continues to complain intermittent speech difficulty, had MRI again 12/2015 negative. EEG no seizure. However, in 05/2016 pt had right side weakness and dysarthria. MRI showed left MCA patchy cortical infarct. CTA head and neck showed left M1 high grade stenosis. she was referred to Dr. Corliss Skainseveshwar for consideration of intracranial stent. She  had left MCA stent on 07/24/16, put on ASA and brilinta. However, she was readmitted on 07/28/16 for nonocclusive thrombus in the left MCA stent. MRI showed patchy left MCA cortical infarcts. Pt underwent angioplasty for in-stent stenosis. LDL 52 and A1C 7.4. She was continued on ASA and brilinta and pravastatin on discharge.  Currently still has right facial droop and mild right hand dexterity difficulty, not able to write well. Low energy level and easily tired. Finished home PT/OT and on home exercise now. Pt complains of lack of energy, depressed mood, inactive at home.  She still complains of HA since last week. In the past has tried topamax but stopped due to  cognitive issue. Has tried inderal long time ago, not effective and currently she is on multiple BP meds including beta bloker. She has tried keppra, depakote, neurontin and amitriptyline all not effective. Like to avoid imitrex due to stroke. Has tried occipital nerve block but not effective. On lamictal 100mg  bid now. Had botox for HA but not effective also.  Plan:  - continue ASA and brilinta and prevastatin for stroke prevntion - check BP and glucose at home.  - will refer to Dr. Lucia GaskinsAhern for headache recommendations  - will add zoloft for depression and lack of motivation - recommend more activity at home - would recommend to be out of work for at least one more month and then reevaluate.  - Follow up with your primary care physician for stroke risk factor modification. Recommend maintain blood pressure goal <130/80, diabetes with hemoglobin A1c goal below 6.5% and lipids with LDL cholesterol goal below 70 mg/dL.  - follow up with Dr. Corliss Skainseveshwar as scheduled. - follow up in 3 months or sooner if needed  I spent more than 25 minutes of face to face time with the patient. Greater than 50% of time was spent in counseling and coordination of care. We have discussed about HA management, refer to Dr. Lucia GaskinsAhern, add zoloft for depression, follow up with Dr. Corliss Skainseveshwar.    Meds ordered this encounter  Medications  . sertraline (ZOLOFT) 50 MG tablet    Sig: Take 1 tablet (50 mg total) by mouth daily.    Dispense:  30 tablet    Refill:  3   Orders Placed This Encounter  Procedures  . Ambulatory referral to Neurology    Referral Priority:   Routine    Referral Type:   Consultation    Referral Reason:   Specialty Services Required    Referred to Provider:   Anson FretAntonia B Ahern, MD    Requested Specialty:   Neurology    Number of Visits Requested:   1    Patient Instructions  - continue ASA and brilinta and prevastatin for stroke prevntion - check BP and glucose at home.  - will refer to Dr. Lucia GaskinsAhern  for headache recommendations  - will add zoloft for depression and lack of motivation - recommend more activity at home - would recommend to be out of work for at least one more month and then reevaluate.  - Follow up with your primary care physician for stroke risk factor modification. Recommend maintain blood pressure goal <130/80, diabetes with hemoglobin A1c goal below 6.5% and lipids with LDL cholesterol goal below 70 mg/dL.  - follow up with Dr. Corliss Skainseveshwar as scheduled. - follow up in 3 months or sooner if needed   Marvel PlanJindong Younis Mathey, MD PhD [facility] [address], [address] [place], [place] [address] [phone]

## 2016-08-23 NOTE — Progress Notes (Signed)
Handicap form given to patient for 6 months only. Pt will be evaluated.

## 2016-08-23 NOTE — Patient Instructions (Signed)
-   continue ASA and brilinta and prevastatin for stroke prevntion - check BP and glucose at home.  - will refer to Dr. Lucia GaskinsAhern for headache recommendations  - will add zoloft for depression and lack of motivation - recommend more activity at home - would recommend to be out of work for at least one more month and then reevaluate.  - Follow up with your primary care physician for stroke risk factor modification. Recommend maintain blood pressure goal <130/80, diabetes with hemoglobin A1c goal below 6.5% and lipids with LDL cholesterol goal below 70 mg/dL.  - follow up with Dr. Corliss Skainseveshwar as scheduled. - follow up in 3 months or sooner if needed

## 2016-08-23 NOTE — Progress Notes (Signed)
Medications patient has tried for headaches. PT has tried topamax, imitrex, elavil,and tylenol, and depakote.All of these medications did not help with her headaches. She also tried Neurontin .

## 2016-09-06 ENCOUNTER — Telehealth: Payer: Self-pay | Admitting: Neurology

## 2016-09-06 NOTE — Telephone Encounter (Signed)
Patient is calling asking that a copy of her note to return to work on 09-22-16 to be faxed to Dr. Ludwig ClarksMoreira at [phone].

## 2016-09-06 NOTE — Telephone Encounter (Signed)
If patient calls back she was given a copy at last office visit. There is a copy in the system but is not sign. Ask the patient does she still have the sign copy he gave her. Dr. Roda ShuttersXu will not be in office till after thanksgiving. He will be at the hospital next week. Ai sign copy cannot be done until he is back in the office.

## 2016-09-07 NOTE — Telephone Encounter (Signed)
Patient called to advise, return to work letter states to remain out of work until December 1st and will re-evaluate at that time but doesn't have appointment with Dr. Roda ShuttersXu until February 6th (patient has appointment with Dr. Lucia GaskinsAhern November 22nd). Please advise.

## 2016-09-07 NOTE — Telephone Encounter (Signed)
Dr. Roda ShuttersXu put patient can return to work Sep 22, 2016.If she needs to be out longer, another letter can be done.

## 2016-09-07 NOTE — Telephone Encounter (Signed)
Patient returned Katrina's call, advised that her work will accept unsigned copy of return to work note. Please fax to [phone].

## 2016-09-07 NOTE — Telephone Encounter (Signed)
Patient was given sign copy of letter to Dr .Roda ShuttersXu at last office visit. Per patients phone note her office will accept unsigned copy of work letter. Rn fax unsigned copy to Dr.Moreira at [phone].

## 2016-09-08 NOTE — Telephone Encounter (Signed)
LMVM for pt that will need to touch base with Dr. Roda ShuttersXu next week about this.  See's Dr. Lucia GaskinsAhern on 09-13-16.  Appt with him is 11-28-2016.  Ofv note states be out one more month then reevaluate.

## 2016-09-08 NOTE — Telephone Encounter (Signed)
Pt called wanting to know if she needs to be seen by Dr Roda ShuttersXu prior to returning to work on 12/1.

## 2016-09-11 ENCOUNTER — Telehealth (HOSPITAL_COMMUNITY): Payer: Self-pay | Admitting: Radiology

## 2016-09-11 NOTE — Telephone Encounter (Signed)
No need to see me before 09/22/16. I will see what Dr. Lucia GaskinsAhern thinks on 09/13/16 during her visit with Dr. Lucia GaskinsAhern. Then, I can sign the letter to release her on 09/22/16 if appropriate. Thanks.   Hi, Katrina, please remind me once she sees Dr. Lucia GaskinsAhern. Thanks.  Marvel PlanJindong Develle Sievers, MD PhD Stroke Neurology 09/11/2016 6:22 AM

## 2016-09-11 NOTE — Telephone Encounter (Signed)
Thanks, I will discuss with her on wednesday

## 2016-09-11 NOTE — Telephone Encounter (Signed)
LMVM for pt of response from Dr. Roda ShuttersXu as per below.   She is to call if questions otherwise see 09-13-16 with Dr. Lucia GaskinsAhern.

## 2016-09-11 NOTE — Telephone Encounter (Signed)
Pt was notified that per Dr. Corliss Skainseveshwar she needs to continue on her Aspirin 325mg  daily but she is to change her Brilinta to 90mg  in the morning and 45mg  in the evening. Pt states understanding. She will come in on 09/20/16 for a recheck of her [name]. JM

## 2016-09-13 ENCOUNTER — Telehealth: Payer: Self-pay | Admitting: *Deleted

## 2016-09-13 ENCOUNTER — Encounter: Payer: Self-pay | Admitting: Neurology

## 2016-09-13 ENCOUNTER — Ambulatory Visit (INDEPENDENT_AMBULATORY_CARE_PROVIDER_SITE_OTHER): Payer: BLUE CROSS/BLUE SHIELD | Admitting: Neurology

## 2016-09-13 VITALS — BP 138/73 | HR 73 | Ht 65.0 in | Wt 213.0 lb

## 2016-09-13 DIAGNOSIS — G43711 Chronic migraine without aura, intractable, with status migrainosus: Secondary | ICD-10-CM

## 2016-09-13 MED ORDER — DULOXETINE HCL 60 MG PO CPEP: 60.0000 mg | ORAL_CAPSULE | Freq: Every day | ORAL | 6 refills | Status: DC

## 2016-09-13 NOTE — Patient Instructions (Signed)
Remember to drink plenty of fluid, eat healthy meals and do not skip any meals. Try to eat protein with a every meal and eat a healthy snack such as fruit or nuts in between meals. Try to keep a regular sleep-wake schedule and try to exercise daily, particularly in the form of walking, 20-30 minutes a day, if you can.   As far as your medications are concerned, I would like to suggest: Cymbalta 60 mg dail  I would like to see you back in 3 months, sooner if we need to. Please call us with any interim questions, concerns, problems, updates or refill requests.   Our phone number is [phone]. We also have an after hours call service for urgent matters and there is a physician on-call for urgent questions. For any emergencies you know to call 911 or go to the nearest emergency room  Duloxetine delayed-release capsules What is this medicine? DULOXETINE (doo LOX e teen) is used to treat depression, anxiety, and different types of chronic pain. This medicine may be used for other purposes; ask your health care provider or pharmacist if you have questions. COMMON BRAND NAME(S): Cymbalta, Irenka What should I tell my health care provider before I take this medicine? They need to know if you have any of these conditions: -bipolar disorder or a family history of bipolar disorder -glaucoma -kidney disease -liver disease -suicidal thoughts or a previous suicide attempt -taken medicines called MAOIs like Carbex, Eldepryl, Marplan, Nardil, and Parnate within 14 days -an unusual reaction to duloxetine, other medicines, foods, dyes, or preservatives -pregnant or trying to get pregnant -breast-feeding How should I use this medicine? Take this medicine by mouth with a glass of water. Follow the directions on the prescription label. Do not cut, crush or chew this medicine. You can take this medicine with or without food. Take your medicine at regular intervals. Do not take your medicine more often than  directed. Do not stop taking this medicine suddenly except upon the advice of your doctor. Stopping this medicine too quickly may cause serious side effects or your condition may worsen. A special MedGuide will be given to you by the pharmacist with each prescription and refill. Be sure to read this information carefully each time. Talk to your pediatrician regarding the use of this medicine in children. While this drug may be prescribed for children as young as 35 years of age for selected conditions, precautions do apply. Overdosage: If you think you have taken too much of this medicine contact a poison control center or emergency room at once. NOTE: This medicine is only for you. Do not share this medicine with others. What if I miss a dose? If you miss a dose, take it as soon as you can. If it is almost time for your next dose, take only that dose. Do not take double or extra doses. What may interact with this medicine? Do not take this medicine with any of the following medications: -desvenlafaxine -levomilnacipran -linezolid -MAOIs like Carbex, Eldepryl, Marplan, Nardil, and Parnate -methylene blue (injected into a vein) -milnacipran -thioridazine -venlafaxine This medicine may also interact with the following medications: -alcohol -amphetamines -aspirin and aspirin-like medicines -certain antibiotics like ciprofloxacin and enoxacin -certain medicines for blood pressure, heart disease, irregular heart beat -certain medicines for depression, anxiety, or psychotic disturbances -certain medicines for migraine headache like almotriptan, eletriptan, frovatriptan, naratriptan, rizatriptan, sumatriptan, zolmitriptan -certain medicines that treat or prevent blood clots like warfarin, enoxaparin, and dalteparin -cimetidine -fentanyl -lithium -NSAIDS, medicines  for pain and inflammation, like ibuprofen or naproxen -phentermine -procarbazine -rasagiline -sibutramine -[facility] John's  wort -theophylline -tramadol -tryptophan This list may not describe all possible interactions. Give your health care provider a list of all the medicines, herbs, non-prescription drugs, or dietary supplements you use. Also tell them if you smoke, drink alcohol, or use illegal drugs. Some items may interact with your medicine. What should I watch for while using this medicine? Tell your doctor if your symptoms do not get better or if they get worse. Visit your doctor or health care professional for regular checks on your progress. Because it may take several weeks to see the full effects of this medicine, it is important to continue your treatment as prescribed by your doctor. Patients and their families should watch out for new or worsening thoughts of suicide or depression. Also watch out for sudden changes in feelings such as feeling anxious, agitated, panicky, irritable, hostile, aggressive, impulsive, severely restless, overly excited and hyperactive, or not being able to sleep. If this happens, especially at the beginning of treatment or after a change in dose, call your health care professional. Bonita QuinYou may get drowsy or dizzy. Do not drive, use machinery, or do anything that needs mental alertness until you know how this medicine affects you. Do not stand or sit up quickly, especially if you are an older patient. This reduces the risk of dizzy or fainting spells. Alcohol may interfere with the effect of this medicine. Avoid alcoholic drinks. This medicine can cause an increase in blood pressure. This medicine can also cause a sudden drop in your blood pressure, which may make you feel faint and increase the chance of a fall. These effects are most common when you first start the medicine or when the dose is increased, or during use of other medicines that can cause a sudden drop in blood pressure. Check with your doctor for instructions on monitoring your blood pressure while taking this medicine. Your  mouth may get dry. Chewing sugarless gum or sucking hard candy, and drinking plenty of water may help. Contact your doctor if the problem does not go away or is severe. What side effects may I notice from receiving this medicine? Side effects that you should report to your doctor or health care professional as soon as possible: -allergic reactions like skin rash, itching or hives, swelling of the face, lips, or tongue -anxious -breathing problems -confusion -changes in vision -chest pain -confusion -elevated mood, decreased need for sleep, racing thoughts, impulsive behavior -eye pain -fast, irregular heartbeat -feeling faint or lightheaded, falls -feeling agitated, angry, or irritable -hallucination, loss of contact with reality -high blood pressure -loss of balance or coordination -palpitations -redness, blistering, peeling or loosening of the skin, including inside the mouth -restlessness, pacing, inability to keep still -seizures -stiff muscles -suicidal thoughts or other mood changes -trouble passing urine or change in the amount of urine -trouble sleeping -unusual bleeding or bruising -unusually weak or tired -vomiting -yellowing of the eyes or skin Side effects that usually do not require medical attention (report to your doctor or health care professional if they continue or are bothersome): -change in sex drive or performance -change in appetite or weight -constipation -dizziness -dry mouth -headache -increased sweating -nausea -tired This list may not describe all possible side effects. Call your doctor for medical advice about side effects. You may report side effects to FDA at 1-800-FDA-1088. Where should I keep my medicine? Keep out of the reach of children. Store at  room temperature between 20 and 25 degrees C (68 to 77 degrees F). Throw away any unused medicine after the expiration date. NOTE: This sheet is a summary. It may not cover all possible  information. If you have questions about this medicine, talk to your doctor, pharmacist, or health care provider.  2017 Elsevier/Gold Standard (2016-03-09 18:16:03)

## 2016-09-13 NOTE — Telephone Encounter (Signed)
I faxed pt release on 09/13/16 requesting all records.

## 2016-09-13 NOTE — Progress Notes (Signed)
[facility]    Provider:  Dr Lucia Gaskins Referring Provider: Ralene Ok, MD Primary Care Physician:  Ralene Ok, MD  CC:  Migraines  HPI:  Patricia [name] is a 50 y.o. female here as a referral from Dr. Ludwig Clarks for headache. PMH of obesity, hypertension, diabetes, hyperlipidemia, previous embolic stroke in 2009 presumably due to PFO/ASD state post of closure by Dr. Jacinto Halim was admitted on 05/22/14 for right arm and leg numbness weakness, difficulty with speech. MRI had showed left MCA territory small multiple infarcts. MRA head showed significant intracranial stenosis. She was discharged on aspirin and Plavix dural antiplatelet therapy and outpatient PT/OT. She has headache for the last 30 years. the pain are at the back of her head at skull base line, with pain at the back of right eye. 2-3 per month, usually lasting for 2 days. 10/10, phonophobia but no photophobia, nausea but no vomiting.  She has daily HA, has tried inderal, depakote, amitriptyline, topamax, imitrex, botox but none of them worked either due to side effects or ineffectiveness. She also had LP for CSF drainage due to ? Increased intracranial pressure, but still not effective. She denies tension type HA.   Headaches started 30 years ago. Her aunt, son have migraines. Headaches very, sometimes she feel like someone is hitting her on the head, behind the eyes, and the back of the right side. Like someone stabbing her behind the eye or like an axe on the top of her head.  She denies denies any lacrimation, injection or other autonomic associated symptoms. When the stabbing behind the eye comes it can last a few hours. The stabbing headache is diffferent than the pther headaches. The pain in the back of the head is pounding and she feels an axe on the top of her head sometimes all of these are together but sometimes the eye stabbing pain is separate. She has had a current headache since yesterday. Today she has pounding  in the right back of the head. She has most days so 25/30 day of the month.  She has light sensitivity, smell sensitivy, perfumes are triggers. She also has nausea and vomiting. Headaches can last days. Most headaches are migrainous at least 20/20 days in a month. No aura. No medication overuse.  She was seeing [facility] for headaches, Dr. [place] Lions for headaches for a long time (will request records). She was diagnosed with migraines. Sh ehad a sleep study and a lumbar puncture. She saw him many years ago since 2009. She tired Topamax and worsened memory loss. The depakote did not work. Amitriptyline recently and did not work, it was not controlling the headaches. Lamictal is not helping either.   Medications tried: Inderal, depakote, amitriptyline, topamax, imitrex, botox. Effexor, lexapro  Review of Systems: Patient complains of symptoms per HPI as well as the following symptoms: no CP, no SOB. Pertinent negatives per HPI. All others negative.   Social History   Social History  . Marital status: Single    Spouse name: [name]  . Number of children: 4  . Years of education: College    Occupational History  . 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 Smoking status: Never Smoker  . Smokeless tobacco: Never Used  . Alcohol use 0.0 oz/week     Comment: occasional  . Drug use: No  . Sexual activity: No   Other Topics Concern  . Not on file   Social History Narrative   Patient is single  with 4 children.   Patient is right handed.   Patient has college education.   Patient drinks 1 cup daily.    Family History  Problem Relation Age of Onset  . Hypertension Mother   . Hypertension Father   . Cancer Maternal Aunt   . Cancer Maternal Uncle     Past Medical History:  Diagnosis Date  . Anemia    in the past  . Asthma    " a long time ago"  . Bursitis   . Depression    13 years ago -   . Diabetes mellitus without complication (HCC)    type  2  . GERD  (gastroesophageal reflux disease)    occ. acid reflux  . Headache    migraines  . Hypertension   . Kidney stones   . PFO (patent foramen ovale)    hx of PFO with surgery done as an adult  . Rotator cuff tear    right  . Stroke [facility](HCC) 2009   lasdt stroke July 2015  . Syncope and collapse     Past Surgical History:  Procedure Laterality Date  . ABDOMINAL HYSTERECTOMY    . BREAST REDUCTION SURGERY    . CARDIAC CATHETERIZATION    . CARDIAC SURGERY  Closed hole in heart in 2009   Amplatzer Septal Occluder Ref [id number] (not listed in op notes, document scanned under MRI with written op notes  . HYSTEROTOMY    . IR GENERIC HISTORICAL  06/23/2016   IR ANGIO INTRA EXTRACRAN SEL COM CAROTID INNOMINATE BILAT MOD SED 06/23/2016 Julieanne CottonSanjeev Deveshwar, MD [facility]  . IR GENERIC HISTORICAL  06/23/2016   IR ANGIO VERTEBRAL SEL VERTEBRAL BILAT MOD SED 06/23/2016 Julieanne CottonSanjeev Deveshwar, MD [facility]  . IR GENERIC HISTORICAL  06/29/2016   IR RADIOLOGIST EVAL & MGMT 06/29/2016 [facility]  . IR GENERIC HISTORICAL  07/24/2016   IR INTRA CRAN STENT 07/24/2016 Julieanne CottonSanjeev Deveshwar, MD [facility]  . IR GENERIC HISTORICAL  07/28/2016   IR PTA INTRACRANIAL 07/28/2016 Julieanne CottonSanjeev Deveshwar, MD [facility]  . IR GENERIC HISTORICAL  07/28/2016   IR ENDOVASC INTRACRANIAL INF OTHER THAN THROMBO ART INC DIAG ANGIO 07/28/2016 Julieanne CottonSanjeev Deveshwar, MD [facility]  . IR GENERIC HISTORICAL  08/07/2016   IR RADIOLOGIST EVAL & MGMT 08/07/2016 [facility]  . RADIOLOGY WITH ANESTHESIA [name] 07/24/2016   Procedure: Stenting;  Surgeon: Julieanne CottonSanjeev Deveshwar, MD;  Location: [facility] OR;  Service: Radiology;  Laterality: [name];  . RADIOLOGY WITH ANESTHESIA [name] 07/28/2016   Procedure: RADIOLOGY WITH ANESTHESIA;  Surgeon: Medication Radiologist, MD;  Location: [facility] OR;  Service: Radiology;  Laterality: [name];  . SHOULDER ARTHROSCOPY WITH OPEN ROTATOR CUFF REPAIR AND DISTAL CLAVICLE ACROMINECTOMY Right 02/04/2015   Procedure: SHOULDER ARTHROSCOPY WITH  SUBACROMIAL DECOMPRESSION AND OPEN ROTATOR CUFF REPAIR,;  Surgeon: Cammy CopaScott Gregory Dean, MD;  Location: [facility] OR;  Service: Orthopedics;  Laterality: Right;  RIGHT SHOULDER DOA,SAD,MINI-OPEN ROTATOR CUFF TEAR REPAIR.    Current Outpatient Prescriptions  Medication Sig Dispense Refill  . aspirin EC 81 MG tablet Take 81 mg by mouth at bedtime.    . chlorthalidone (HYGROTON) 25 MG tablet Take 25 mg by mouth at bedtime.   5  . Empagliflozin-Metformin HCl (SYNJARDY) 12.02-999 MG TABS Take 1 tablet by mouth 2 (two) times daily.     Marland Kitchen. lamoTRIgine (LAMICTAL) 25 MG tablet 25mg  Qhs x 2 wks, then 25mg  bid x 2 wks, then 25mg  am/50mg  pm x 2 wks, then 50mg  bid x 2 wks, then 50mg   am/100mg  pm x 2 wks, then 100mg  bid (Patient taking differently: Take 100 mg by mouth 2 (two) times daily. ) 240 tablet 3  . Nebivolol HCl (BYSTOLIC) 20 MG TABS Take 20 mg by mouth every evening.     Marland Kitchen oxyCODONE-acetaminophen (PERCOCET) 10-325 MG tablet Take 1 tablet by mouth every 4 (four) hours as needed for pain.    . pravastatin (PRAVACHOL) 40 MG tablet Take 40 mg by mouth at bedtime.    . promethazine (PHENERGAN) 25 MG tablet Take 25 mg by mouth 3 (three) times daily as needed for nausea or vomiting.     [name] 100-33 UNT-MCG/ML SOPN INJECT 32 UNITS UNDER THE SKIN DAILY in the evening AS NEEDED FOR BLOOD SUGAR  6  . ticagrelor (BRILINTA) 90 MG TABS tablet Take 1 tablet (90 mg total) by mouth 2 (two) times daily. 60 tablet 2  . tiZANidine (ZANAFLEX) 4 MG tablet Take 4 mg by mouth every 8 (eight) hours as needed (migraines).     . DULoxetine (CYMBALTA) 60 MG capsule Take 1 capsule (60 mg total) by mouth daily. 30 capsule 6   No current facility-administered medications for this visit.     Allergies as of 09/13/2016 - Review Complete 08/23/2016  Allergen Reaction Noted  . Imitrex [sumatriptan] Anaphylaxis 05/22/2014  . Triptans Anaphylaxis and Hives 09/13/2016  . Plavix [clopidogrel bisulfate] Hives 08/27/2014  . Topamax  [topiramate]  08/23/2016  . Lipitor [atorvastatin] Rash 01/19/2015    Vitals: BP 138/73   Pulse 73   Ht 5\' 5"  (1.651 m)   Wt 213 lb (96.6 kg)   BMI 35.45 kg/m  Last Weight:  Wt Readings from Last 1 Encounters:  09/13/16 213 lb (96.6 kg)   Last Height:   Ht Readings from Last 1 Encounters:  09/13/16 5\' 5"  (1.651 m)    Physical exam: Exam: Gen: NAD, conversant, well nourised, obese, well groomed                      Neuro: Detailed Neurologic Exam  Speech:    Speech is normal; fluent and spontaneous with normal comprehension.  Cognition:    The patient is oriented to person, place, and time;  Cranial Nerves:    The pupils are equal, round, and reactive to light.  Visual fields are full to finger confrontation. Extraocular movements are intact. Trigeminal sensation is intact and the muscles of mastication are normal. The face is symmetric. The palate elevates in the midline. Hearing intact. Voice is normal. Shoulder shrug is normal. The tongue has normal motion without fasciculations.    Assessment/Plan:  50 year old with intractable migraines  Patient has tried multiple preventatives already. She only had one botox injection, it can take up to 3 to see improvement. Other medications we can use include: Cymbalta,  gabapentin, she tried amitriptyline could try nortriptyline or other TCA, verapamil or other calcium channel blocker, or acetazolamide  Will try Cymbalta, may also help with her mood Do not start Zoloft Botox approval in process  Discussed: To prevent or relieve headaches, try the following: Cool Compress. Lie down and place a cool compress on your head.  Avoid headache triggers. If certain foods or odors seem to have triggered your migraines in the past, avoid them. A headache diary might help you identify triggers.  Include physical activity in your daily routine. Try a daily walk or other moderate aerobic exercise.  Manage stress. Find healthy ways to cope  with the stressors,  such as delegating tasks on your to-do list.  Practice relaxation techniques. Try deep breathing, yoga, massage and visualization.  Eat regularly. Eating regularly scheduled meals and maintaining a healthy diet might help prevent headaches. Also, drink plenty of fluids.  Follow a regular sleep schedule. Sleep deprivation might contribute to headaches Consider biofeedback. With this mind-body technique, you learn to control certain bodily functions - such as muscle tension, heart rate and blood pressure - to prevent headaches or reduce headache pain.    Proceed to emergency room if you experience new or worsening symptoms or symptoms do not resolve, if you have new neurologic symptoms or if headache is severe, or for any concerning symptom.     Naomie DeanAntonia Ahern, MD  [facility] [address] [place], [place] [id number]  Phone [phone] Fax [phone]  A total of 30 minutes was spent face-to-face with this patient. Over half this time was spent on counseling patient on the intractable migraine diagnosis and different diagnostic and therapeutic options available.

## 2016-09-15 DIAGNOSIS — G43711 Chronic migraine without aura, intractable, with status migrainosus: Secondary | ICD-10-CM | POA: Insufficient documentation

## 2016-09-21 ENCOUNTER — Ambulatory Visit: Payer: BLUE CROSS/BLUE SHIELD | Admitting: Neurology

## 2016-09-21 ENCOUNTER — Telehealth: Payer: Self-pay

## 2016-09-21 ENCOUNTER — Encounter: Payer: Self-pay | Admitting: Neurology

## 2016-09-21 NOTE — Telephone Encounter (Signed)
Letter for work fax to patients job to Dr Ralene Okoy Moreira Md at [phone]. Letter fax twice and confirmed for pt to return to work. Dr. Roda ShuttersXu has spoken with pts employer and the patient.

## 2016-09-26 ENCOUNTER — Other Ambulatory Visit: Payer: Self-pay | Admitting: *Deleted

## 2016-09-26 DIAGNOSIS — G43711 Chronic migraine without aura, intractable, with status migrainosus: Secondary | ICD-10-CM

## 2016-09-26 MED ORDER — ONABOTULINUMTOXINA 100 UNITS IJ SOLR: INTRAMUSCULAR | 3 refills | Status: DC

## 2016-10-10 ENCOUNTER — Telehealth: Payer: Self-pay | Admitting: Neurology

## 2016-10-10 NOTE — Telephone Encounter (Signed)
Dr Lucia GaskinsAhern- you saw this patient last on 09/13/16. Please advise. Also a Dr Roda ShuttersXu pt.

## 2016-10-10 NOTE — Telephone Encounter (Signed)
Tell her to stop the cymbalta and start the Zoloft that Dr. Roda ShuttersXu prescribed. Zoloft is good for mood and headaches, we can try this instead. thanks

## 2016-10-10 NOTE — Telephone Encounter (Signed)
Called and spoke with pt. She does have rx zoloft 50mg  that Dr Roda ShuttersXu gave her back in November. She was advised to take 1 tablet daily. Advised per AA,MD to start taking this and stop the cymbalta. She stated she has not started the zoloft yet and agreeable to try this. She will call back if she has any SE.   She stopped taking lamictal because she did not get any relief from medication.   She also has not heard about botox. Advised I will speak with our botox coordinator and have her call her to discuss.  I checked and we gave botox form on 09/13/16 for DX: G43.711.

## 2016-10-10 NOTE — Telephone Encounter (Signed)
Patient is calling regarding medication DULoxetine (CYMBALTA) 60 MG capsule. She started taking the medication on 09-13-16 and stopped taking last Friday because it makes her nauseated and dizzy. Is there another medication she can take?

## 2016-10-11 ENCOUNTER — Telehealth: Payer: Self-pay

## 2016-10-11 NOTE — Telephone Encounter (Signed)
Rn receive incoming from [facility] at [phone], They wanted to know of a delivery date for patients botox. [facility]. When Danielle calls back she would call number listed and press option one.

## 2016-10-12 NOTE — Telephone Encounter (Signed)
Called patient to schedule injection, she did not answer so I left a VM. Her medication will be here on 10/13/16.

## 2016-10-30 ENCOUNTER — Other Ambulatory Visit: Payer: Self-pay

## 2016-10-30 NOTE — Patient Outreach (Signed)
First telephone outreach attempt to patient to obtain mRS. No answer, left message for return call.   Nicole Naylah Cork, B.A.  [facility] Care Management Assistant  

## 2016-11-01 ENCOUNTER — Other Ambulatory Visit: Payer: Self-pay

## 2016-11-01 NOTE — Patient Outreach (Signed)
Second telephone outreach attempt to obtain mRS. No answer, left message for return call.   Nicole Adalae Baysinger, B.A.  [facility] Care Management Assistant  

## 2016-11-03 ENCOUNTER — Other Ambulatory Visit: Payer: Self-pay

## 2016-11-03 NOTE — Patient Outreach (Signed)
Telephone outreach to patient to obtain mRS was successfully completed. mRS = 1  Nicole Musab Wingard, B.A.  [facility] Care Management Assistant  

## 2016-11-07 ENCOUNTER — Other Ambulatory Visit (HOSPITAL_COMMUNITY): Payer: Self-pay | Admitting: Interventional Radiology

## 2016-11-07 DIAGNOSIS — I771 Stricture of artery: Secondary | ICD-10-CM

## 2016-11-07 DIAGNOSIS — I639 Cerebral infarction, unspecified: Secondary | ICD-10-CM

## 2016-11-14 ENCOUNTER — Emergency Department (HOSPITAL_COMMUNITY): Payer: BLUE CROSS/BLUE SHIELD

## 2016-11-14 ENCOUNTER — Emergency Department (HOSPITAL_COMMUNITY)
Admission: EM | Admit: 2016-11-14 | Discharge: 2016-11-14 | Disposition: A | Discharge status: Home / Self Care | Payer: BLUE CROSS/BLUE SHIELD | Attending: Emergency Medicine | Admitting: Emergency Medicine

## 2016-11-14 ENCOUNTER — Encounter (HOSPITAL_COMMUNITY): Payer: Self-pay

## 2016-11-14 DIAGNOSIS — I69392 Facial weakness following cerebral infarction: Secondary | ICD-10-CM

## 2016-11-14 DIAGNOSIS — G43009 Migraine without aura, not intractable, without status migrainosus: Secondary | ICD-10-CM

## 2016-11-14 DIAGNOSIS — E1165 Type 2 diabetes mellitus with hyperglycemia: Secondary | ICD-10-CM | POA: Insufficient documentation

## 2016-11-14 DIAGNOSIS — Z79899 Other long term (current) drug therapy: Secondary | ICD-10-CM | POA: Insufficient documentation

## 2016-11-14 DIAGNOSIS — Z5181 Encounter for therapeutic drug level monitoring: Secondary | ICD-10-CM | POA: Insufficient documentation

## 2016-11-14 DIAGNOSIS — R197 Diarrhea, unspecified: Secondary | ICD-10-CM | POA: Insufficient documentation

## 2016-11-14 DIAGNOSIS — I10 Essential (primary) hypertension: Secondary | ICD-10-CM | POA: Diagnosis not present

## 2016-11-14 DIAGNOSIS — Z8673 Personal history of transient ischemic attack (TIA), and cerebral infarction without residual deficits: Secondary | ICD-10-CM | POA: Insufficient documentation

## 2016-11-14 DIAGNOSIS — Z7982 Long term (current) use of aspirin: Secondary | ICD-10-CM | POA: Diagnosis not present

## 2016-11-14 DIAGNOSIS — R51 Headache: Secondary | ICD-10-CM | POA: Diagnosis not present

## 2016-11-14 DIAGNOSIS — I679 Cerebrovascular disease, unspecified: Secondary | ICD-10-CM | POA: Diagnosis not present

## 2016-11-14 DIAGNOSIS — R519 Headache, unspecified: Secondary | ICD-10-CM

## 2016-11-14 DIAGNOSIS — J45909 Unspecified asthma, uncomplicated: Secondary | ICD-10-CM | POA: Insufficient documentation

## 2016-11-14 DIAGNOSIS — Z8639 Personal history of other endocrine, nutritional and metabolic disease: Secondary | ICD-10-CM

## 2016-11-14 DIAGNOSIS — R112 Nausea with vomiting, unspecified: Secondary | ICD-10-CM | POA: Insufficient documentation

## 2016-11-14 DIAGNOSIS — Z8669 Personal history of other diseases of the nervous system and sense organs: Secondary | ICD-10-CM

## 2016-11-14 DIAGNOSIS — R531 Weakness: Secondary | ICD-10-CM | POA: Diagnosis present

## 2016-11-14 DIAGNOSIS — R739 Hyperglycemia, unspecified: Secondary | ICD-10-CM

## 2016-11-14 LAB — BASIC METABOLIC PANEL
ANION GAP: 12 (ref 5–15)
BUN: 25 mg/dL — ABNORMAL HIGH (ref 6–20)
CO2: 24 mmol/L (ref 22–32)
Calcium: 9.3 mg/dL (ref 8.9–10.3)
Chloride: 101 mmol/L (ref 101–111)
Creatinine, Ser: 1.59 mg/dL — ABNORMAL HIGH (ref 0.44–1.00)
GFR calc Af Amer: 43 mL/min — ABNORMAL LOW (ref >60)
GFR, EST NON AFRICAN AMERICAN: 37 mL/min — AB (ref >60)
GLUCOSE: 372 mg/dL — AB (ref 65–99)
POTASSIUM: 3.3 mmol/L — AB (ref 3.5–5.1)
Sodium: 137 mmol/L (ref 135–145)

## 2016-11-14 LAB — I-STAT VENOUS BLOOD GAS, ED
ACID-BASE DEFICIT: 1 mmol/L (ref 0.0–2.0)
Bicarbonate: 26.1 mmol/L (ref 20.0–28.0)
O2 SAT: 88 %
PO2 VEN: 60 mmHg — AB (ref 32.0–45.0)
TCO2: 28 mmol/L (ref 0–100)
pCO2, Ven: 52 mmHg (ref 44.0–60.0)
pH, Ven: 7.308 (ref 7.250–7.430)

## 2016-11-14 LAB — DIFFERENTIAL
BASOS PCT: 0 %
Basophils Absolute: 0 10*3/uL (ref 0.0–0.1)
EOS ABS: 0 10*3/uL (ref 0.0–0.7)
EOS PCT: 0 %
Lymphocytes Relative: 19 %
Lymphs Abs: 1.7 10*3/uL (ref 0.7–4.0)
Monocytes Absolute: 0.3 10*3/uL (ref 0.1–1.0)
Monocytes Relative: 4 %
NEUTROS PCT: 77 %
Neutro Abs: 6.9 10*3/uL (ref 1.7–7.7)

## 2016-11-14 LAB — I-STAT CHEM 8, ED
BUN: 27 mg/dL — ABNORMAL HIGH (ref 6–20)
CHLORIDE: 98 mmol/L — AB (ref 101–111)
Calcium, Ion: 1.11 mmol/L — ABNORMAL LOW (ref 1.15–1.40)
Creatinine, Ser: 1.6 mg/dL — ABNORMAL HIGH (ref 0.44–1.00)
GLUCOSE: 378 mg/dL — AB (ref 65–99)
HEMATOCRIT: 41 % (ref 36.0–46.0)
Hemoglobin: 13.9 g/dL (ref 12.0–15.0)
POTASSIUM: 3.3 mmol/L — AB (ref 3.5–5.1)
SODIUM: 137 mmol/L (ref 135–145)
TCO2: 27 mmol/L (ref 0–100)

## 2016-11-14 LAB — ETHANOL

## 2016-11-14 LAB — URINALYSIS, ROUTINE W REFLEX MICROSCOPIC
Bacteria, UA: NONE SEEN
Bilirubin Urine: NEGATIVE
HGB URINE DIPSTICK: NEGATIVE
KETONES UR: NEGATIVE mg/dL
LEUKOCYTES UA: NEGATIVE
NITRITE: NEGATIVE
PROTEIN: NEGATIVE mg/dL
Specific Gravity, Urine: 1.027 (ref 1.005–1.030)
pH: 5 (ref 5.0–8.0)

## 2016-11-14 LAB — CBC
HEMATOCRIT: 40.3 % (ref 36.0–46.0)
HEMOGLOBIN: 13 g/dL (ref 12.0–15.0)
MCH: 26.9 pg (ref 26.0–34.0)
MCHC: 32.3 g/dL (ref 30.0–36.0)
MCV: 83.4 fL (ref 78.0–100.0)
Platelets: 221 10*3/uL (ref 150–400)
RBC: 4.83 MIL/uL (ref 3.87–5.11)
RDW: 13.8 % (ref 11.5–15.5)
WBC: 9 10*3/uL (ref 4.0–10.5)

## 2016-11-14 LAB — COMPREHENSIVE METABOLIC PANEL
ALBUMIN: 3.5 g/dL (ref 3.5–5.0)
ALK PHOS: 125 U/L (ref 38–126)
ALT: 34 U/L (ref 14–54)
AST: 41 U/L (ref 15–41)
Anion gap: 12 (ref 5–15)
BUN: 26 mg/dL — ABNORMAL HIGH (ref 6–20)
CALCIUM: 9.1 mg/dL (ref 8.9–10.3)
CHLORIDE: 100 mmol/L — AB (ref 101–111)
CO2: 25 mmol/L (ref 22–32)
CREATININE: 1.61 mg/dL — AB (ref 0.44–1.00)
GFR calc Af Amer: 42 mL/min — ABNORMAL LOW (ref >60)
GFR calc non Af Amer: 37 mL/min — ABNORMAL LOW (ref >60)
GLUCOSE: 281 mg/dL — AB (ref 65–99)
Potassium: 3.2 mmol/L — ABNORMAL LOW (ref 3.5–5.1)
SODIUM: 137 mmol/L (ref 135–145)
Total Bilirubin: 0.3 mg/dL (ref 0.3–1.2)
Total Protein: 6.8 g/dL (ref 6.5–8.1)

## 2016-11-14 LAB — CBG MONITORING, ED
GLUCOSE-CAPILLARY: 236 mg/dL — AB (ref 65–99)
Glucose-Capillary: 329 mg/dL — ABNORMAL HIGH (ref 65–99)
Glucose-Capillary: 152 mg/dL — ABNORMAL HIGH (ref 65–99)

## 2016-11-14 LAB — RAPID URINE DRUG SCREEN, HOSP PERFORMED
Amphetamines: NOT DETECTED
BARBITURATES: NOT DETECTED
Benzodiazepines: NOT DETECTED
Cocaine: NOT DETECTED
Opiates: NOT DETECTED
Tetrahydrocannabinol: NOT DETECTED

## 2016-11-14 LAB — I-STAT TROPONIN, ED: TROPONIN I, POC: 0 ng/mL (ref 0.00–0.08)

## 2016-11-14 LAB — PROTIME-INR
INR: 1
PROTHROMBIN TIME: 13.3 s (ref 11.4–15.2)

## 2016-11-14 LAB — APTT: APTT: 27 s (ref 24–36)

## 2016-11-14 MED ORDER — SODIUM CHLORIDE 0.9 % IV BOLUS (SEPSIS)
1000.0000 mL | Freq: Once | INTRAVENOUS | Status: AC
  Administered 2016-11-14: 1000 mL via INTRAVENOUS

## 2016-11-14 MED ORDER — INSULIN ASPART 100 UNIT/ML ~~LOC~~ SOLN
5.0000 [IU] | Freq: Once | SUBCUTANEOUS | Status: AC
  Administered 2016-11-14: 5 [IU] via INTRAVENOUS
  Filled 2016-11-14: qty 1

## 2016-11-14 MED ORDER — INSULIN ASPART 100 UNIT/ML ~~LOC~~ SOLN: 9.0000 [IU] | Freq: Once | SUBCUTANEOUS | Status: DC

## 2016-11-14 MED ORDER — ONDANSETRON 4 MG PO TBDP: 4.0000 mg | ORAL_TABLET | Freq: Three times a day (TID) | ORAL | 0 refills | Status: DC | PRN

## 2016-11-14 MED ORDER — PROCHLORPERAZINE EDISYLATE 5 MG/ML IJ SOLN
10.0000 mg | Freq: Once | INTRAMUSCULAR | Status: AC
  Administered 2016-11-14: 10 mg via INTRAVENOUS
  Filled 2016-11-14: qty 2

## 2016-11-14 NOTE — ED Triage Notes (Signed)
To triage via EMS.  Onset 3 days nausea, weakness.  At work this morning felt like she was going to pass out and dizzy.  Diarrhea x 2, loose, brown.  Pt sat down ate crackers, ginger.  Took her prescribed percocet and phengeran, then 1 hour later vomited x 2.  After vomiting pt got left arm pain.  No nausea, dizziness at this time.

## 2016-11-14 NOTE — ED Notes (Signed)
Pt has right facial droop, right sided weakness, and slurred speech from previous stroke.  Per patient these symptoms has not changed.  Pt reports she sat down on floor this morning d/t weakness.  No injuries.  Pt reports she feels "tired" all over.

## 2016-11-14 NOTE — ED Notes (Signed)
Pt was escorted to lobby by this nurse.  Karle BarrJennifer Michaux who works with Dr. Corliss Skainseveshwar seen pt in lobby and told this nurse that her facial droop and slurred speech was worse than her usual.  She then called the doctor pt works for (Dr. Ludwig ClarksMoreira) and he reports that @ 10:30am pts overall baseline status changed and thought pt was having a stroke.  Pt then taken to Dr. Ethelda ChickJacubowitz who called coke stroke with [facility] @ 10:30am.

## 2016-11-14 NOTE — ED Provider Notes (Signed)
MSE was initiated and I personally evaluated the patient and placed orders (if any) at  12:05 PM on November 14, 2016.  The patient appears stable so that the remainder of the MSE may be completed by another provider.  Patient reportedly complained of generalized weakness and "feeling tired" for the past 3 days while being evaluated at her PMDs office today and acute change in her status was noted at 10:30 AM when patient began stumbling while walking. Patient reports "feeling tired" on exam she is alert awake Glasgow coma score 15. Moves all extremities. HEENT exam questionable left mouth droop suggestive of central cranial nerve VII deficit other cranial nerves II through XII grossly intact. Code stroke called   Doug SouSam Ameka Krigbaum, MD 11/14/16 1207

## 2016-11-14 NOTE — ED Provider Notes (Signed)
[facility] Provider Note  CSN: [id number] Arrival date & time: 11/14/16  1119  History   Chief Complaint Chief Complaint  Patient presents with  . Weakness  . Nausea   HPI Patricia [name] is a 51 y.o. female.  The history is provided by the patient and medical records. No language interpreter was used.  Illness  This is a new problem. The current episode started more than 2 days ago. The problem occurs constantly. The problem has been gradually worsening. Associated symptoms include headaches. Pertinent negatives include no chest pain, no abdominal pain and no shortness of breath. The symptoms are aggravated by eating. Nothing relieves the symptoms.    Past Medical History:  Diagnosis Date  . Anemia    in the past  . Asthma    " a long time ago"  . Bursitis   . Depression    13 years ago -   . Diabetes mellitus without complication (HCC)    type  2  . GERD (gastroesophageal reflux disease)    occ. acid reflux  . Headache    migraines  . Hypertension   . Kidney stones   . PFO (patent foramen ovale)    hx of PFO with surgery done as an adult  . Rotator cuff tear    right  . Stroke [facility](HCC) 2009   lasdt stroke July 2015  . Syncope and collapse    Patient Active Problem List   Diagnosis Date Noted  . Intractable chronic migraine without aura with status migrainosus 09/15/2016  . Depression 08/23/2016  . Cerebrovascular accident (CVA) (HCC) 07/28/2016  . Acute respiratory failure (HCC)   . Middle cerebral artery stenosis, left 07/24/2016  . Headache 03/23/2016  . Type 2 diabetes mellitus with complication, with long-term current use of insulin (HCC) 09/08/2015  . Hyperglycemia   . TIA (transient ischemic attack)   . AKI (acute kidney injury) (HCC) 02/20/2015  . ARF (acute renal failure) (HCC) 02/20/2015  . Rotator cuff tear 02/04/2015  . Occipital neuralgia 08/27/2014  . Type 2 diabetes mellitus with complication (HCC) 08/27/2014  . PFO (patent  foramen ovale) 06/17/2014  . HLD (hyperlipidemia) 06/17/2014  . Morbid obesity (HCC) 06/17/2014  . Acute CVA (cerebrovascular accident) (HCC) 05/22/2014  . Non-compliance 05/22/2014  . History of cardioembolic stroke 05/22/2014  . Status post device closure of ASD 05/22/2014  . HTN (hypertension), benign 05/22/2014  . DM type 2 (diabetes mellitus, type 2) (HCC) 05/22/2014   Past Surgical History:  Procedure Laterality Date  . ABDOMINAL HYSTERECTOMY    . BREAST REDUCTION SURGERY    . CARDIAC CATHETERIZATION    . CARDIAC SURGERY  Closed hole in heart in 2009   Amplatzer Septal Occluder Ref [id number] (not listed in op notes, document scanned under MRI with written op notes  . HYSTEROTOMY    . IR GENERIC HISTORICAL  06/23/2016   IR ANGIO INTRA EXTRACRAN SEL COM CAROTID INNOMINATE BILAT MOD SED 06/23/2016 Julieanne CottonSanjeev Deveshwar, MD [facility]  . IR GENERIC HISTORICAL  06/23/2016   IR ANGIO VERTEBRAL SEL VERTEBRAL BILAT MOD SED 06/23/2016 Julieanne CottonSanjeev Deveshwar, MD [facility]  . IR GENERIC HISTORICAL  06/29/2016   IR RADIOLOGIST EVAL & MGMT 06/29/2016 [facility]  . IR GENERIC HISTORICAL  07/24/2016   IR INTRA CRAN STENT 07/24/2016 Julieanne CottonSanjeev Deveshwar, MD [facility]  . IR GENERIC HISTORICAL  07/28/2016   IR PTA INTRACRANIAL 07/28/2016 Julieanne CottonSanjeev Deveshwar, MD [facility]  . IR GENERIC HISTORICAL  07/28/2016   IR  ENDOVASC INTRACRANIAL INF OTHER THAN THROMBO ART INC DIAG ANGIO 07/28/2016 Julieanne Cotton, MD [facility]  . IR GENERIC HISTORICAL  08/07/2016   IR RADIOLOGIST EVAL & MGMT 08/07/2016 [facility]  . RADIOLOGY WITH ANESTHESIA [name] 07/24/2016   Procedure: Stenting;  Surgeon: Julieanne Cotton, MD;  Location: [facility] OR;  Service: Radiology;  Laterality: [name];  . RADIOLOGY WITH ANESTHESIA [name] 07/28/2016   Procedure: RADIOLOGY WITH ANESTHESIA;  Surgeon: Medication Radiologist, MD;  Location: [facility] OR;  Service: Radiology;  Laterality: [name];  . SHOULDER ARTHROSCOPY WITH OPEN ROTATOR CUFF REPAIR AND DISTAL  CLAVICLE ACROMINECTOMY Right 02/04/2015   Procedure: SHOULDER ARTHROSCOPY WITH SUBACROMIAL DECOMPRESSION AND OPEN ROTATOR CUFF REPAIR,;  Surgeon: Cammy Copa, MD;  Location: [facility] OR;  Service: Orthopedics;  Laterality: Right;  RIGHT SHOULDER DOA,SAD,MINI-OPEN ROTATOR CUFF TEAR REPAIR.   OB History    No data available      Home Medications    Prior to Admission medications   Medication Sig Start Date End Date Taking? Authorizing Provider  aspirin EC 81 MG tablet Take 81 mg by mouth at bedtime.   Yes Historical Provider, MD  chlorthalidone (HYGROTON) 25 MG tablet Take 25 mg by mouth at bedtime.  09/03/15  Yes Historical Provider, MD  Empagliflozin-Metformin HCl (SYNJARDY) 12.02-999 MG TABS Take 1 tablet by mouth 2 (two) times daily.    Yes Historical Provider, MD  Nebivolol HCl (BYSTOLIC) 20 MG TABS Take 20 mg by mouth every evening.    Yes Historical Provider, MD  oxyCODONE-acetaminophen (PERCOCET) 10-325 MG tablet Take 1 tablet by mouth every 4 (four) hours as needed for pain.   Yes Historical Provider, MD  pravastatin (PRAVACHOL) 40 MG tablet Take 40 mg by mouth at bedtime.   Yes Historical Provider, MD  promethazine (PHENERGAN) 25 MG tablet Take 25 mg by mouth 3 (three) times daily as needed for nausea or vomiting.    Yes Historical Provider, MD  sertraline (ZOLOFT) 50 MG tablet Take 50 mg by mouth at bedtime.   Yes Historical Provider, MD  SOLIQUA 100-33 UNT-MCG/ML SOPN INJECT 32 UNITS UNDER THE SKIN DAILY in the evening AS NEEDED FOR BLOOD SUGAR 02/25/16  Yes Historical Provider, MD  ticagrelor (BRILINTA) 90 MG TABS tablet Take 1 tablet (90 mg total) by mouth 2 (two) times daily. Patient taking differently: Take 45-90 mg by mouth 2 (two) times daily. Take 1/2 tablet (45mg ) in the morning, and 1 tablet (90mg ) at night 07/30/16  Yes David L Rinehuls, PA-C  tiZANidine (ZANAFLEX) 4 MG tablet Take 4 mg by mouth every 8 (eight) hours as needed (migraines).    Yes Historical Provider, MD    botulinum toxin Type A (BOTOX) 100 units SOLR injection Inject  IM by provider in office every 3 months Patient not taking: Reported on 11/14/2016 09/26/16   Anson Fret, MD  DULoxetine (CYMBALTA) 60 MG capsule Take 1 capsule (60 mg total) by mouth daily. Patient not taking: Reported on 11/14/2016 09/13/16   Anson Fret, MD  lamoTRIgine (LAMICTAL) 25 MG tablet 25mg  Qhs x 2 wks, then 25mg  bid x 2 wks, then 25mg  am/50mg  pm x 2 wks, then 50mg  bid x 2 wks, then 50mg  am/100mg  pm x 2 wks, then 100mg  bid Patient not taking: Reported on 11/14/2016 05/15/16   Marvel Plan, MD   Family History Family History  Problem Relation Age of Onset  . Hypertension Mother   . Hypertension Father   . Cancer Maternal Aunt   . Cancer Maternal Uncle  Social History Social History  Substance Use Topics  . Smoking status: Never Smoker  . Smokeless tobacco: Never Used  . Alcohol use 0.0 oz/week     Comment: occasional    Allergies   Imitrex [sumatriptan]; Triptans; Plavix [clopidogrel bisulfate]; Cymbalta [duloxetine hcl]; Topamax [topiramate]; and Lipitor [atorvastatin]   Review of Systems Review of Systems  Constitutional: Positive for appetite change (decreased). Negative for chills and fever.  Respiratory: Negative for shortness of breath.   Cardiovascular: Negative for chest pain.  Gastrointestinal: Positive for diarrhea (watery non-bloody), nausea and vomiting (NBNB). Negative for abdominal pain.  Genitourinary: Negative for dysuria and frequency.  Neurological: Positive for facial asymmetry (baseline, unchanged), weakness (baseline, mild, unchanged), light-headedness and headaches. Negative for syncope, speech difficulty and numbness.  All other systems reviewed and are negative.   Physical Exam Updated Vital Signs BP 125/85   Pulse 71   Temp 98.7 F (37.1 C) (Oral)   Resp 18   Ht 5\' 5"  (1.651 m)   Wt 90.3 kg   SpO2 100%   BMI 33.12 kg/m   Physical Exam  Constitutional: She is  oriented to person, place, and time. No distress.  Overweight middle aged AA female  HENT:  Head: Normocephalic and atraumatic.  Eyes: EOM are normal. Pupils are equal, round, and reactive to light.  Neck: Normal range of motion. Neck supple.  Cardiovascular: Normal rate, regular rhythm and normal heart sounds.   Pulmonary/Chest: Effort normal and breath sounds normal.  Abdominal: Soft. Bowel sounds are normal. She exhibits no distension. There is no tenderness.  Musculoskeletal: Normal range of motion.  Neurological: She is alert and oriented to person, place, and time.  Alert and oriented 4, cranial nerves II through XII intact (aside from right facial droop), strength 4+/5 to RUE/RLE & 5/5 to LUE/LLE, no pronator drift, coordination normal, speech fluent, gait normal  Skin: Skin is warm and dry. Capillary refill takes less than 2 seconds. She is not diaphoretic.  Nursing note and vitals reviewed.   ED Treatments / Results  Labs (all labs ordered are listed, but only abnormal results are displayed) Labs Reviewed  BASIC METABOLIC PANEL - Abnormal; Notable for the following:       Result Value   Potassium 3.3 (*)    Glucose, Bld 372 (*)    BUN 25 (*)    Creatinine, Ser 1.59 (*)    GFR calc non Af Amer 37 (*)    GFR calc Af Amer 43 (*)    All other components within normal limits  URINALYSIS, ROUTINE W REFLEX MICROSCOPIC - Abnormal; Notable for the following:    Glucose, UA >=500 (*)    Squamous Epithelial / LPF 0-5 (*)    All other components within normal limits  COMPREHENSIVE METABOLIC PANEL - Abnormal; Notable for the following:    Potassium 3.2 (*)    Chloride 100 (*)    Glucose, Bld 281 (*)    BUN 26 (*)    Creatinine, Ser 1.61 (*)    GFR calc non Af Amer 37 (*)    GFR calc Af Amer 42 (*)    All other components within normal limits  CBG MONITORING, ED - Abnormal; Notable for the following:    Glucose-Capillary 329 (*)    All other components within normal limits    I-STAT CHEM 8, ED - Abnormal; Notable for the following:    Potassium 3.3 (*)    Chloride 98 (*)    BUN 27 (*)  Creatinine, Ser 1.60 (*)    Glucose, Bld 378 (*)    Calcium, Ion 1.11 (*)    All other components within normal limits  I-STAT VENOUS BLOOD GAS, ED - Abnormal; Notable for the following:    pO2, Ven 60.0 (*)    All other components within normal limits  CBG MONITORING, ED - Abnormal; Notable for the following:    Glucose-Capillary 236 (*)    All other components within normal limits  CBC  ETHANOL  PROTIME-INR  APTT  DIFFERENTIAL  RAPID URINE DRUG SCREEN, HOSP PERFORMED  URINALYSIS, ROUTINE W REFLEX MICROSCOPIC  I-STAT TROPOININ, ED   EKG  EKG Interpretation  Date/Time:  Tuesday November 14 2016 11:36:59 EST Ventricular Rate:  76 PR Interval:  176 QRS Duration: 90 QT Interval:  388 QTC Calculation: 436 R Axis:   -30 Text Interpretation:  Normal sinus rhythm Left axis deviation Minimal voltage criteria for LVH, may be normal variant T wave abnormality Abnormal ekg Confirmed by Gerhard Munch  MD [phone]) on 11/14/2016 11:49:26 AM      Radiology Ct Head Code Stroke W/o Cm  Result Date: 11/14/2016 CLINICAL DATA:  Code stroke. 50 year old female with weakness worsening facial droop, unsteady gait and fall. Personal history of intracranial arterial stenosis, with in July lying left MCA M1 stent. Left MCA infarcts in October 2017. Initial encounter. EXAM: CT HEAD WITHOUT CONTRAST TECHNIQUE: Contiguous axial images were obtained from the base of the skull through the vertex without intravenous contrast. COMPARISON:  Brain MRI 07/29/2016 and earlier. FINDINGS: Brain: Post ischemic hypodensity in the left frontal lobe white matter. Small areas of cortical encephalomalacia at the left operculum and also the right distal cingulate gyrus are stable and/or expected from the prior studies. No superimposed acute cortically based infarct identified. Mild heterogeneity about the  caudate nuclei is stable. No midline shift, ventriculomegaly, mass effect, evidence of mass lesion, or acute intracranial hemorrhage. Vascular: Left MCA M1 segment vascular stent re- demonstrated and appears stable in position. No suspicious intracranial vascular hyperdensity. Skull: No acute osseous abnormality identified. Sinuses/Orbits: Visualized paranasal sinuses and mastoids are stable and well pneumatized. Other: Negative orbit and scalp soft tissues. ASPECTS [facility] Stroke Program Early CT Score) - Ganglionic level infarction (caudate, lentiform nuclei, internal capsule, insula, M1-M3 cortex): 7 - Supraganglionic infarction (M4-M6 cortex): 3 Total score (0-10 with 10 being normal): 10 IMPRESSION: 1. Expected evolution of the left MCA territory infarcts in October. No acute cortically based infarct or acute intracranial hemorrhage identified. 2. ASPECTS is 10. 3. Chronic left MCA M1 stent. 4. The above was relayed via text pager to Dr. Bennett Scrape on 11/14/2016 at 12:33 . Electronically Signed   By: Odessa Fleming M.D.   On: 11/14/2016 12:33   Procedures Procedures (including critical care time)  Medications Ordered in ED Medications  sodium chloride 0.9 % bolus 1,000 mL (not administered)  sodium chloride 0.9 % bolus 1,000 mL (0 mLs Intravenous Stopped 11/14/16 1405)  prochlorperazine (COMPAZINE) injection 10 mg (10 mg Intravenous Given 11/14/16 1304)  insulin aspart (novoLOG) injection 5 Units (5 Units Intravenous Given 11/14/16 1424)    Initial Impression / Assessment and Plan / ED Course  I have reviewed the triage vital signs and the nursing notes.  51 y.o. female with above stated PMHx, HPI, and physical. PMHx of HTN, DMT2, CVA, & HA's (weekly). Symptom onset x2 days ago. Generalized fatigue & weakness. Today with nausea, NBNB emesis, watery non-bloody diarrhea, & HA. Took her prescribed Percocet & Phengeran  with relief of nausea but minimal relief of HA. Known Hx of HA's followed by Neurology.  Lightheadedness this AM with near-syncope episode at work. Sat on the floor. No fall. Denies hitting head, LOC, or amnesia. Known CVA from Oct 2017 with R facial droop 7 R sided weakness. Gets worse when patient is tired and thought to be slightly worse today by staff but Pt states they have been unchanged. Denies slurred speech, vision changes, extremity weakness/numbness.  Labs notable for hyperglycemia without acidosis or gap, UA w/o ketones - no concern for DKA. Pt given IV insulin & IVF's as well as IV Compazine. HA nearly resolved. Pt feeling much better. POCT glucose 150's. Suspect symptoms 2/2 to dehydration for N/V/D & hyperglycemia from inability to take home meds this AM. Pt d/c'd home with zofran SL and close PCP follow-up.  Laboratory and imaging results were personally reviewed by myself and used in the medical decision making of this patient's treatment and disposition.  Pt discharged home in stable condition. Strict ED return precautions dicussed. Pt understands and agrees with the plan and has no further questions or concerns.   Pt care discussed with and followed by my attending, Dr. Gerhard Munch  Angelina Ok, MD Pager [phone]  Final Clinical Impressions(s) / ED Diagnoses   Final diagnoses:  Nausea vomiting and diarrhea  Generalized headache  History of migraine headaches  History of CVA (cerebrovascular accident)  Hyperglycemia  History of diabetes mellitus   New Prescriptions New Prescriptions   No medications on file     Angelina Ok, MD 11/14/16 1550    Gerhard Munch, MD 11/15/16 1056

## 2016-11-14 NOTE — Consult Note (Signed)
Requesting Physician: Dr. Jeraldine Loots    Chief Complaint:  Generalized weakness called as a code stroke  History obtained from:  Patient     HPI:                                                                                                                                         Patricia [name] is an 51 y.o. female with history of strokes in the past. She was left with right-sided weakness and right facial droop with her old strokes. Patient has been having nausea vomiting and diarrhea today. She had 2 bouts of diarrhea 2 bouts of vomiting. She's also been feeling extremely weak over the last couple days. The weakness is not unilateral but just generalized. 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 Upon entering the emergency part in a code stroke was called. CT of head showed no acute abnormalities. Patient did not even notice that she had a facial droop that was worse than normal.  Date last known well: Date: 11/14/2016 Time last known well: Time: 10:30 tPA Given: No: no symptoms   Past Medical History:  Diagnosis Date  . Anemia    in the past  . Asthma    " a long time ago"  . Bursitis   . Depression    13 years ago -   . Diabetes mellitus without complication (HCC)    type  2  . GERD (gastroesophageal reflux disease)    occ. acid reflux  . Headache    migraines  . Hypertension   . Kidney stones   . PFO (patent foramen ovale)    hx of PFO with surgery done as an adult  . Rotator cuff tear    right  . Stroke [facility]) 2009   lasdt stroke July 2015  . Syncope and collapse     Past Surgical History:  Procedure Laterality Date  . ABDOMINAL HYSTERECTOMY    . BREAST REDUCTION SURGERY    . CARDIAC CATHETERIZATION    . CARDIAC SURGERY  Closed hole in heart in 2009   Amplatzer Septal Occluder Ref [id number] (not listed in op notes, document scanned under MRI with written op notes  .  HYSTEROTOMY    . IR GENERIC HISTORICAL  06/23/2016   IR ANGIO INTRA EXTRACRAN SEL COM CAROTID INNOMINATE BILAT MOD SED 06/23/2016 Julieanne Cotton, MD [facility]  . IR GENERIC HISTORICAL  06/23/2016   IR ANGIO VERTEBRAL SEL VERTEBRAL BILAT MOD SED 06/23/2016 Julieanne Cotton, MD [facility]  . IR GENERIC HISTORICAL  06/29/2016   IR RADIOLOGIST EVAL & MGMT 06/29/2016 [facility]  . IR GENERIC HISTORICAL  07/24/2016   IR INTRA CRAN STENT 07/24/2016 Julieanne Cotton, MD [facility]  . IR GENERIC HISTORICAL  07/28/2016   IR PTA INTRACRANIAL 07/28/2016 Julieanne Cotton,  MD [facility]  . IR GENERIC HISTORICAL  07/28/2016   IR ENDOVASC INTRACRANIAL INF OTHER THAN THROMBO ART INC DIAG ANGIO 07/28/2016 Julieanne CottonSanjeev Deveshwar, MD [facility]  . IR GENERIC HISTORICAL  08/07/2016   IR RADIOLOGIST EVAL & MGMT 08/07/2016 [facility]  . RADIOLOGY WITH ANESTHESIA [name] 07/24/2016   Procedure: Stenting;  Surgeon: Julieanne CottonSanjeev Deveshwar, MD;  Location: [facility];  Service: Radiology;  Laterality: [name];  . RADIOLOGY WITH ANESTHESIA [name] 07/28/2016   Procedure: RADIOLOGY WITH ANESTHESIA;  Surgeon: Medication Radiologist, MD;  Location: [facility];  Service: Radiology;  Laterality: [name];  . SHOULDER ARTHROSCOPY WITH OPEN ROTATOR CUFF REPAIR AND DISTAL CLAVICLE ACROMINECTOMY Right 02/04/2015   Procedure: SHOULDER ARTHROSCOPY WITH SUBACROMIAL DECOMPRESSION AND OPEN ROTATOR CUFF REPAIR,;  Surgeon: Cammy CopaScott Gregory Dean, MD;  Location: [facility] OR;  Service: Orthopedics;  Laterality: Right;  RIGHT SHOULDER DOA,SAD,MINI-OPEN ROTATOR CUFF TEAR REPAIR.    Family History  Problem Relation Age of Onset  . Hypertension Mother   . Hypertension Father   . Cancer Maternal Aunt   . Cancer Maternal Uncle    Social History:  reports that she has never smoked. She has never used smokeless tobacco. She reports that she drinks alcohol. She reports that she does not use drugs.  Allergies:  Allergies  Allergen Reactions  . Imitrex [Sumatriptan] Anaphylaxis   . Triptans Anaphylaxis and Hives  . Plavix [Clopidogrel Bisulfate] Hives  . Topamax [Topiramate]     Memory issues  . Lipitor [Atorvastatin] Rash    Medications:                                                                                                                           Current Facility-Administered Medications  Medication Dose Route Frequency Provider Last Rate Last Dose  . prochlorperazine (COMPAZINE) injection 10 mg  10 mg Intravenous Once Angelina Okyan Franasiak, MD      . sodium chloride 0.9 % bolus 1,000 mL  1,000 mL Intravenous Once Angelina Okyan Franasiak, MD       Current Outpatient Prescriptions  Medication Sig Dispense Refill  . aspirin EC 81 MG tablet Take 81 mg by mouth at bedtime.    . botulinum toxin Type A (BOTOX) 100 units SOLR injection Inject  IM by provider in office every 3 months 2 vial 3  . chlorthalidone (HYGROTON) 25 MG tablet Take 25 mg by mouth at bedtime.   5  . DULoxetine (CYMBALTA) 60 MG capsule Take 1 capsule (60 mg total) by mouth daily. 30 capsule 6  . Empagliflozin-Metformin HCl (SYNJARDY) 12.02-999 MG TABS Take 1 tablet by mouth 2 (two) times daily.     Marland Kitchen. lamoTRIgine (LAMICTAL) 25 MG tablet 25mg  Qhs x 2 wks, then 25mg  bid x 2 wks, then 25mg  am/50mg  pm x 2 wks, then 50mg  bid x 2 wks, then 50mg  am/100mg  pm x 2 wks, then 100mg  bid (Patient taking differently: Take 100 mg by mouth 2 (two) times daily. ) 240 tablet 3  .  Nebivolol HCl (BYSTOLIC) 20 MG TABS Take 20 mg by mouth every evening.     Marland Kitchen oxyCODONE-acetaminophen (PERCOCET) 10-325 MG tablet Take 1 tablet by mouth every 4 (four) hours as needed for pain.    . pravastatin (PRAVACHOL) 40 MG tablet Take 40 mg by mouth at bedtime.    . promethazine (PHENERGAN) 25 MG tablet Take 25 mg by mouth 3 (three) times daily as needed for nausea or vomiting.     [name] 100-33 UNT-MCG/ML SOPN INJECT 32 UNITS UNDER THE SKIN DAILY in the evening AS NEEDED FOR BLOOD SUGAR  6  . ticagrelor (BRILINTA) 90 MG TABS tablet Take 1  tablet (90 mg total) by mouth 2 (two) times daily. 60 tablet 2  . tiZANidine (ZANAFLEX) 4 MG tablet Take 4 mg by mouth every 8 (eight) hours as needed (migraines).        ROS:                                                                                                                                       History obtained from the patient  General ROS: negative for - chills, fatigue, fever, night sweats, weight gain or weight loss Psychological ROS: negative for - behavioral disorder, hallucinations, memory difficulties, mood swings or suicidal ideation Ophthalmic ROS: negative for - blurry vision, double vision, eye pain or loss of vision ENT ROS: negative for - epistaxis, nasal discharge, oral lesions, sore throat, tinnitus or vertigo Allergy and Immunology ROS: negative for - hives or itchy/watery eyes Hematological and Lymphatic ROS: negative for - bleeding problems, bruising or swollen lymph nodes Endocrine ROS: negative for - galactorrhea, hair pattern changes, polydipsia/polyuria or temperature intolerance Respiratory ROS: negative for - cough, hemoptysis, shortness of breath or wheezing Cardiovascular ROS: negative for - chest pain, dyspnea on exertion, edema or irregular heartbeat Gastrointestinal ROS: negative for - abdominal pain, diarrhea, hematemesis, nausea/vomiting or stool incontinence Genito-Urinary ROS: negative for - dysuria, hematuria, incontinence or urinary frequency/urgency Musculoskeletal ROS: negative for - joint swelling or muscular weakness Neurological ROS: as noted in HPI Dermatological ROS: negative for rash and skin lesion changes  Neurologic Examination:                                                                                                      Blood pressure 116/82, pulse 74, temperature 98.7 F (37.1 C), temperature source Oral, resp. rate 16, height 5\' 5"  (1.651 m), weight 90.3 kg (199 lb), SpO2 100 %.  HEENT-  Normocephalic, no lesions,  without obvious abnormality.  Normal external eye and conjunctiva.  Normal TM's bilaterally.  Normal auditory canals and external ears. Normal external nose, mucus membranes and septum.  Normal pharynx. Cardiovascular- S1, S2 normal, pulses palpable throughout   Lungs- chest clear, no wheezing, rales, normal symmetric air entry Abdomen- normal findings: bowel sounds normal Extremities- no edema Lymph-no adenopathy palpable Musculoskeletal-no joint tenderness, deformity or swelling Skin-warm and dry, no hyperpigmentation, vitiligo, or suspicious lesions  Neurological Examination Mental Status: Alert, oriented, thought content appropriate.  Speech fluent without evidence of aphasia.  Able to follow 3 step commands without difficulty. Cranial Nerves: II:  Visual fields grossly normal,  III,IV, VI: ptosis not present, extra-ocular motions intact bilaterally, pupils equal, round, reactive to light and accommodation V,VII: smile asymmetric on the right but this is old, facial light touch sensation decreased on the right but this again is old VIII: hearing normal bilaterally IX,X: uvula rises symmetrically XI: bilateral shoulder shrug XII: midline tongue extension Motor: Right : Upper extremity   5/5    Left:     Upper extremity   5/5  Lower extremity   5/5     Lower extremity   5/5 Tone and bulk:normal tone throughout; no atrophy noted Sensory: Pinprick and light touch intact throughout, bilaterally Deep Tendon Reflexes: 2+ and symmetric throughout bilateral upper extremities and knee jerk no ankle jerk Plantars: Right: downgoing   Left: downgoing Cerebellar: normal finger-to-nose,  and normal heel-to-shin test Gait: Not tested       Lab Results: Basic Metabolic Panel:  Recent Labs Lab 11/14/16 1131 11/14/16 1210  NA 137 137  K 3.3* 3.3*  CL 101 98*  CO2 24  --   GLUCOSE 372* 378*  BUN 25* 27*  CREATININE 1.59* 1.60*  CALCIUM 9.3  --     Liver Function Tests: No  results for input(s): AST, ALT, ALKPHOS, BILITOT, PROT, ALBUMIN in the last 168 hours. No results for input(s): LIPASE, AMYLASE in the last 168 hours. No results for input(s): AMMONIA in the last 168 hours.  CBC:  Recent Labs Lab 11/14/16 1131 11/14/16 1205 11/14/16 1210  WBC 9.0  --   --   NEUTROABS  --  6.9  --   HGB 13.0  --  13.9  HCT 40.3  --  41.0  MCV 83.4  --   --   PLT 221  --   --     Cardiac Enzymes: No results for input(s): CKTOTAL, CKMB, CKMBINDEX, TROPONINI in the last 168 hours.  Lipid Panel: No results for input(s): CHOL, TRIG, HDL, CHOLHDL, VLDL, LDLCALC in the last 168 hours.  CBG:  Recent Labs Lab 11/14/16 1145  GLUCAP 329*    Microbiology: Results for orders placed or performed during the hospital encounter of 07/24/16  MRSA PCR Screening     Status: None   Collection Time: 07/24/16  2:44 PM  Result Value Ref Range Status   MRSA by PCR NEGATIVE NEGATIVE Final    Comment:        The GeneXpert MRSA Assay (FDA approved for NASAL specimens only), is one component of a comprehensive MRSA colonization surveillance program. It is not intended to diagnose MRSA infection nor to guide or monitor treatment for MRSA infections.     Coagulation Studies:  Recent Labs  11/14/16 1205  LABPROT 13.3  INR 1.00    Imaging: Ct Head Code Stroke W/o Cm  Result Date: 11/14/2016 CLINICAL DATA:  Code stroke. 51 year old  female with weakness worsening facial droop, unsteady gait and fall. Personal history of intracranial arterial stenosis, with in July lying left MCA M1 stent. Left MCA infarcts in October 2017. Initial encounter. EXAM: CT HEAD WITHOUT CONTRAST TECHNIQUE: Contiguous axial images were obtained from the base of the skull through the vertex without intravenous contrast. COMPARISON:  Brain MRI 07/29/2016 and earlier. FINDINGS: Brain: Post ischemic hypodensity in the left frontal lobe white matter. Small areas of cortical encephalomalacia at the  left operculum and also the right distal cingulate gyrus are stable and/or expected from the prior studies. No superimposed acute cortically based infarct identified. Mild heterogeneity about the caudate nuclei is stable. No midline shift, ventriculomegaly, mass effect, evidence of mass lesion, or acute intracranial hemorrhage. Vascular: Left MCA M1 segment vascular stent re- demonstrated and appears stable in position. No suspicious intracranial vascular hyperdensity. Skull: No acute osseous abnormality identified. Sinuses/Orbits: Visualized paranasal sinuses and mastoids are stable and well pneumatized. Other: Negative orbit and scalp soft tissues. ASPECTS [facility] Stroke Program Early CT Score) - Ganglionic level infarction (caudate, lentiform nuclei, internal capsule, insula, M1-M3 cortex): 7 - Supraganglionic infarction (M4-M6 cortex): 3 Total score (0-10 with 10 being normal): 10 IMPRESSION: 1. Expected evolution of the left MCA territory infarcts in October. No acute cortically based infarct or acute intracranial hemorrhage identified. 2. ASPECTS is 10. 3. Chronic left MCA M1 stent. 4. The above was relayed via text pager to Dr. Bennett Scrape on 11/14/2016 at 12:33 . Electronically Signed   By: Odessa Fleming M.D.   On: 11/14/2016 12:33       Assessment and plan discussed with with attending physician and they are in agreement.    Felicie Morn PA-C [facility] [phone]  11/14/2016, 12:39 PM   Assessment: 51 y.o. female presenting the emergency department with generalized weakness and possibly increased right facial droop. Patient has been having generalized weakness for a few days and today woke up with 2 bouts of nausea vomiting in 2 bouts of diarrhea. Labs show the patient is dehydrated. We'll most likely is happening hears the patient is dehydrated and she is having recrudescence of her old symptoms. At this time no further stroke workup oriented. With further evaluate for flu and/or GI  illness.  Stroke Risk Factors - carotid stenosis, hyperlipidemia and hypertension

## 2016-11-14 NOTE — ED Notes (Signed)
Code stroke cancelled by Dr. Roxy Mannsster

## 2016-11-27 ENCOUNTER — Telehealth (HOSPITAL_COMMUNITY): Payer: Self-pay

## 2016-11-27 NOTE — Telephone Encounter (Signed)
Called to reschedule angio, left message. AW

## 2016-11-28 ENCOUNTER — Ambulatory Visit: Payer: BLUE CROSS/BLUE SHIELD | Admitting: Neurology

## 2016-11-28 ENCOUNTER — Ambulatory Visit (HOSPITAL_COMMUNITY): Admission: RE | Admit: 2016-11-28 | Payer: BLUE CROSS/BLUE SHIELD | Source: Ambulatory Visit

## 2016-12-04 ENCOUNTER — Other Ambulatory Visit: Payer: Self-pay | Admitting: Radiology

## 2016-12-05 ENCOUNTER — Encounter (HOSPITAL_COMMUNITY): Payer: Self-pay

## 2016-12-05 ENCOUNTER — Other Ambulatory Visit (HOSPITAL_COMMUNITY): Payer: Self-pay | Admitting: Interventional Radiology

## 2016-12-05 ENCOUNTER — Ambulatory Visit (HOSPITAL_COMMUNITY)
Admission: RE | Admit: 2016-12-05 | Discharge: 2016-12-05 | Disposition: A | Discharge status: Home / Self Care | Payer: BLUE CROSS/BLUE SHIELD | Source: Ambulatory Visit | Attending: Interventional Radiology | Admitting: Interventional Radiology

## 2016-12-05 DIAGNOSIS — Z8774 Personal history of (corrected) congenital malformations of heart and circulatory system: Secondary | ICD-10-CM | POA: Diagnosis not present

## 2016-12-05 DIAGNOSIS — Z8249 Family history of ischemic heart disease and other diseases of the circulatory system: Secondary | ICD-10-CM | POA: Insufficient documentation

## 2016-12-05 DIAGNOSIS — Z48812 Encounter for surgical aftercare following surgery on the circulatory system: Secondary | ICD-10-CM | POA: Insufficient documentation

## 2016-12-05 DIAGNOSIS — I771 Stricture of artery: Secondary | ICD-10-CM

## 2016-12-05 DIAGNOSIS — R29810 Facial weakness: Secondary | ICD-10-CM | POA: Diagnosis not present

## 2016-12-05 DIAGNOSIS — J45909 Unspecified asthma, uncomplicated: Secondary | ICD-10-CM | POA: Diagnosis not present

## 2016-12-05 DIAGNOSIS — I639 Cerebral infarction, unspecified: Secondary | ICD-10-CM

## 2016-12-05 DIAGNOSIS — I6602 Occlusion and stenosis of left middle cerebral artery: Secondary | ICD-10-CM | POA: Diagnosis not present

## 2016-12-05 DIAGNOSIS — T82858A Stenosis of vascular prosthetic devices, implants and grafts, initial encounter: Secondary | ICD-10-CM | POA: Diagnosis not present

## 2016-12-05 DIAGNOSIS — Y831 Surgical operation with implant of artificial internal device as the cause of abnormal reaction of the patient, or of later complication, without mention of misadventure at the time of the procedure: Secondary | ICD-10-CM | POA: Diagnosis not present

## 2016-12-05 DIAGNOSIS — F329 Major depressive disorder, single episode, unspecified: Secondary | ICD-10-CM | POA: Insufficient documentation

## 2016-12-05 DIAGNOSIS — I10 Essential (primary) hypertension: Secondary | ICD-10-CM | POA: Diagnosis not present

## 2016-12-05 DIAGNOSIS — Z7902 Long term (current) use of antithrombotics/antiplatelets: Secondary | ICD-10-CM | POA: Insufficient documentation

## 2016-12-05 DIAGNOSIS — Z8673 Personal history of transient ischemic attack (TIA), and cerebral infarction without residual deficits: Secondary | ICD-10-CM | POA: Diagnosis not present

## 2016-12-05 DIAGNOSIS — K219 Gastro-esophageal reflux disease without esophagitis: Secondary | ICD-10-CM | POA: Insufficient documentation

## 2016-12-05 DIAGNOSIS — E119 Type 2 diabetes mellitus without complications: Secondary | ICD-10-CM | POA: Insufficient documentation

## 2016-12-05 HISTORY — PX: IR GENERIC HISTORICAL: IMG1180011

## 2016-12-05 LAB — CBC
HEMATOCRIT: 41.4 % (ref 36.0–46.0)
HEMOGLOBIN: 13.3 g/dL (ref 12.0–15.0)
MCH: 26.7 pg (ref 26.0–34.0)
MCHC: 32.1 g/dL (ref 30.0–36.0)
MCV: 83 fL (ref 78.0–100.0)
Platelets: 279 10*3/uL (ref 150–400)
RBC: 4.99 MIL/uL (ref 3.87–5.11)
RDW: 14.9 % (ref 11.5–15.5)
WBC: 8.7 10*3/uL (ref 4.0–10.5)

## 2016-12-05 LAB — BASIC METABOLIC PANEL
ANION GAP: 11 (ref 5–15)
BUN: 14 mg/dL (ref 6–20)
CHLORIDE: 103 mmol/L (ref 101–111)
CO2: 24 mmol/L (ref 22–32)
Calcium: 9.6 mg/dL (ref 8.9–10.3)
Creatinine, Ser: 1.04 mg/dL — ABNORMAL HIGH (ref 0.44–1.00)
GFR calc Af Amer: >60 mL/min (ref >60)
GFR calc non Af Amer: >60 mL/min (ref >60)
Glucose, Bld: 111 mg/dL — ABNORMAL HIGH (ref 65–99)
POTASSIUM: 4.1 mmol/L (ref 3.5–5.1)
Sodium: 138 mmol/L (ref 135–145)

## 2016-12-05 LAB — GLUCOSE, CAPILLARY
GLUCOSE-CAPILLARY: 61 mg/dL — AB (ref 65–99)
GLUCOSE-CAPILLARY: 71 mg/dL (ref 65–99)

## 2016-12-05 LAB — PROTIME-INR
INR: 0.92
Prothrombin Time: 12.3 seconds (ref 11.4–15.2)

## 2016-12-05 LAB — APTT: aPTT: 28 seconds (ref 24–36)

## 2016-12-05 MED ORDER — SODIUM CHLORIDE 0.9 % IV SOLN: INTRAVENOUS | Status: AC

## 2016-12-05 MED ORDER — LIDOCAINE HCL 1 % IJ SOLN
INTRAMUSCULAR | Status: AC
  Filled 2016-12-05: qty 20

## 2016-12-05 MED ORDER — SODIUM CHLORIDE 0.9 % IV SOLN: Freq: Once | INTRAVENOUS | Status: DC

## 2016-12-05 MED ORDER — MIDAZOLAM HCL 2 MG/2ML IJ SOLN
INTRAMUSCULAR | Status: AC
  Filled 2016-12-05: qty 2

## 2016-12-05 MED ORDER — HEPARIN SODIUM (PORCINE) 1000 UNIT/ML IJ SOLN
INTRAMUSCULAR | Status: AC
  Filled 2016-12-05: qty 2

## 2016-12-05 MED ORDER — IOPAMIDOL (ISOVUE-300) INJECTION 61%
INTRAVENOUS | Status: AC
  Administered 2016-12-05: 75 mL
  Filled 2016-12-05: qty 150

## 2016-12-05 MED ORDER — FENTANYL CITRATE (PF) 100 MCG/2ML IJ SOLN
INTRAMUSCULAR | Status: AC | PRN
  Administered 2016-12-05: 25 ug via INTRAVENOUS

## 2016-12-05 MED ORDER — HEPARIN SODIUM (PORCINE) 1000 UNIT/ML IJ SOLN
INTRAMUSCULAR | Status: AC | PRN
  Administered 2016-12-05: 1000 [IU] via INTRAVENOUS

## 2016-12-05 MED ORDER — LIDOCAINE HCL 1 % IJ SOLN
INTRAMUSCULAR | Status: AC | PRN
  Administered 2016-12-05: 10 mL

## 2016-12-05 MED ORDER — OXYCODONE-ACETAMINOPHEN 5-325 MG PO TABS
ORAL_TABLET | ORAL | Status: AC
  Administered 2016-12-05: 2 via ORAL
  Filled 2016-12-05: qty 2

## 2016-12-05 MED ORDER — OXYCODONE-ACETAMINOPHEN 5-325 MG PO TABS
2.0000 | ORAL_TABLET | Freq: Once | ORAL | Status: AC
  Administered 2016-12-05: 2 via ORAL

## 2016-12-05 MED ORDER — MIDAZOLAM HCL 2 MG/2ML IJ SOLN
INTRAMUSCULAR | Status: AC | PRN
  Administered 2016-12-05: 1 mg via INTRAVENOUS

## 2016-12-05 MED ORDER — FENTANYL CITRATE (PF) 100 MCG/2ML IJ SOLN
INTRAMUSCULAR | Status: AC
  Filled 2016-12-05: qty 2

## 2016-12-05 NOTE — Sedation Documentation (Signed)
5Fr sheath removed from R femoral artery by Heather, RT. Hemostasis achieved using Exoseal closure device. Groin unremarkable. 3+RDP, D + RPT.

## 2016-12-05 NOTE — Sedation Documentation (Signed)
Right groin site unremarkable, R dorsalis pedia pulse 3+

## 2016-12-05 NOTE — Sedation Documentation (Signed)
Attempted to call report to SS, no rn available at this time. 

## 2016-12-05 NOTE — Sedation Documentation (Signed)
Report given to melanie, ss rn

## 2016-12-05 NOTE — Procedures (Signed)
S/p 4 VESSEL CEREBRAL ARTERIOGRAM. RT CFA approach. Findings. 1.Approc 70 to 75 % stenosis  of  mid stent seg of Lt MCA

## 2016-12-05 NOTE — Discharge Instructions (Addendum)
Angiogram, Care After °These instructions give you information about caring for yourself after your procedure. Your doctor may also give you more specific instructions. Call your doctor if you have any problems or questions after your procedure. °Follow these instructions at home: °· Take medicines only as told by your doctor. °· Follow your doctor's instructions about: °¨ Care of the area where the tube was inserted. °¨ Bandage (dressing) changes and removal. °· You may shower 24-48 hours after the procedure or as told by your doctor. °· Do not take baths, swim, or use a hot tub until your doctor approves. °· Every day, check the area where the tube was inserted. Watch for: °¨ Redness, swelling, or pain. °¨ Fluid, blood, or pus. °· Do not apply powder or lotion to the site. °· Do not lift anything that is heavier than 10 lb (4.5 kg) for 5 days or as told by your doctor. °· Ask your doctor when you can: °¨ Return to work or school. °¨ Do physical activities or play sports. °¨ Have sex. °· Do not drive or operate heavy machinery for 24 hours or as told by your doctor. °· Have someone with you for the first 24 hours after the procedure. °· Keep all follow-up visits as told by your doctor. This is important. °Contact a health care provider if: °· You have a fever. °· You have chills. °· You have more bleeding from the area where the tube was inserted. Hold pressure on the area. °· You have redness, swelling, or pain in the area where the tube was inserted. °· You have fluid or pus coming from the area. °Get help right away if: °· You have a lot of pain in the area where the tube was inserted. °· The area where the tube was inserted is bleeding, and the bleeding does not stop after 30 minutes of holding steady pressure on the area. °· The area near or just beyond the insertion site becomes pale, cool, tingly, or numb. °This information is not intended to replace advice given to you by your health care provider. Make  sure you discuss any questions you have with your health care provider. °Document Released: 01/05/2009 Document Revised: 03/16/2016 Document Reviewed: 03/12/2013 °Elsevier Interactive Patient Education © 2017 [employer] °Moderate Conscious Sedation, Adult, Care After °These instructions provide you with information about caring for yourself after your procedure. Your health care provider may also give you more specific instructions. Your treatment has been planned according to current medical practices, but problems sometimes occur. Call your health care provider if you have any problems or questions after your procedure. °What can I expect after the procedure? °After your procedure, it is common: °· To feel sleepy for several hours. °· To feel clumsy and have poor balance for several hours. °· To have poor judgment for several hours. °· To vomit if you eat too soon. °Follow these instructions at home: °For at least 24 hours after the procedure:  °· Do not: °¨ Participate in activities where you could fall or become injured. °¨ Drive. °¨ Use heavy machinery. °¨ Drink alcohol. °¨ Take sleeping pills or medicines that cause drowsiness. °¨ Make important decisions or sign legal documents. °¨ Take care of children on your own. °· Rest. °Eating and drinking °· Follow the diet recommended by your health care provider. °· If you vomit: °¨ Drink water, juice, or soup when you can drink without vomiting. °¨ Make sure you have little or no nausea before   eating solid foods. °General instructions °· Have a responsible adult stay with you until you are awake and alert. °· Take over-the-counter and prescription medicines only as told by your health care provider. °· If you smoke, do not smoke without supervision. °· Keep all follow-up visits as told by your health care provider. This is important. °Contact a health care provider if: °· You keep feeling nauseous or you keep vomiting. °· You feel light-headed. °· You develop a  rash. °· You have a fever. °Get help right away if: °· You have trouble breathing. °This information is not intended to replace advice given to you by your health care provider. Make sure you discuss any questions you have with your health care provider. °Document Released: 07/30/2013 Document Revised: 03/13/2016 Document Reviewed: 01/29/2016 °Elsevier Interactive Patient Education © 2017 [employer] ° °

## 2016-12-05 NOTE — H&P (Signed)
Chief Complaint: (L) MCA stenosis, s/p stenting  Referring Physician: Dr. [name]  Supervising Physician: Luanne Bras  Patient Status: [facility] - Out-pt  HPI: Patricia [name] is an 51 y.o. female is well-know to the neuro IR service.  She underwent (L) MCA stenting in October secondary to stenosis.  She then had a repeat intervention just 4 days later with balloon angioplasty for severe near occlusive instent stenosis.  She has done well, but continues to have headaches.  She is on Brilinta as she is allergic to plavix.  She presents today for her 3 month follow up angiogram.  She has no other complaints.   Past Medical History:  Past Medical History:  Diagnosis Date  . Anemia    in the past  . Asthma    " a long time ago"  . Bursitis   . Depression    13 years ago -   . Diabetes mellitus without complication ([place])    type  2  . GERD (gastroesophageal reflux disease)    occ. acid reflux  . Headache    migraines  . Hypertension   . Kidney stones   . PFO (patent foramen ovale)    hx of PFO with surgery done as an adult  . Rotator cuff tear    right  . Stroke [facility]) 2009   lasdt stroke July 2015  . Syncope and collapse     Past Surgical History:  Past Surgical History:  Procedure Laterality Date  . ABDOMINAL HYSTERECTOMY    . BREAST REDUCTION SURGERY    . CARDIAC CATHETERIZATION    . CARDIAC SURGERY  Closed hole in heart in 2009   Amplatzer Septal Occluder Ref [id number] (not listed in op notes, document scanned under MRI with written op notes  . HYSTEROTOMY    . IR GENERIC HISTORICAL  06/23/2016   IR ANGIO INTRA EXTRACRAN SEL COM CAROTID INNOMINATE BILAT MOD SED 06/23/2016 Luanne Bras, MD [facility]-INTERV RAD  . IR GENERIC HISTORICAL  06/23/2016   IR ANGIO VERTEBRAL SEL VERTEBRAL BILAT MOD SED 06/23/2016 Luanne Bras, MD [facility]  . IR GENERIC HISTORICAL  06/29/2016   IR RADIOLOGIST EVAL & MGMT 06/29/2016 [facility]  . IR GENERIC HISTORICAL   07/24/2016   IR INTRA CRAN STENT 07/24/2016 Luanne Bras, MD [facility]  . IR GENERIC HISTORICAL  07/28/2016   IR PTA INTRACRANIAL 07/28/2016 Luanne Bras, MD [facility]  . IR GENERIC HISTORICAL  07/28/2016   IR ENDOVASC INTRACRANIAL INF OTHER THAN THROMBO ART INC DIAG ANGIO 07/28/2016 Luanne Bras, MD [facility]  . IR GENERIC HISTORICAL  08/07/2016   IR RADIOLOGIST EVAL & MGMT 08/07/2016 [facility]  . RADIOLOGY WITH ANESTHESIA [name] 07/24/2016   Procedure: Stenting;  Surgeon: Luanne Bras, MD;  Location: [place];  Service: Radiology;  Laterality: [name];  . RADIOLOGY WITH ANESTHESIA [name] 07/28/2016   Procedure: RADIOLOGY WITH ANESTHESIA;  Surgeon: Medication Radiologist, MD;  Location: [place];  Service: Radiology;  Laterality: [name];  . SHOULDER ARTHROSCOPY WITH OPEN ROTATOR CUFF REPAIR AND DISTAL CLAVICLE ACROMINECTOMY Right 02/04/2015   Procedure: SHOULDER ARTHROSCOPY WITH SUBACROMIAL DECOMPRESSION AND OPEN ROTATOR CUFF REPAIR,;  Surgeon: Meredith Pel, MD;  Location: [place];  Service: Orthopedics;  Laterality: Right;  RIGHT SHOULDER DOA,SAD,MINI-OPEN ROTATOR CUFF TEAR REPAIR.    Family History:  Family History  Problem Relation Age of Onset  . Hypertension Mother   . Hypertension Father   . Cancer Maternal Aunt   . Cancer Maternal  Uncle     Social History:  reports that she has never smoked. She has never used smokeless tobacco. She reports that she drinks alcohol. She reports that she does not use drugs.  Allergies:  Allergies  Allergen Reactions  . Imitrex [Sumatriptan] Anaphylaxis  . Triptans Anaphylaxis and Hives  . Plavix [Clopidogrel Bisulfate] Hives  . Cymbalta [Duloxetine Hcl] Nausea Only  . Topamax [Topiramate]     Memory issues  . Lipitor [Atorvastatin] Rash    Medications: Medications reviewed in epic  Please HPI for pertinent positives, otherwise complete 10 system ROS negative.  Mallampati Score: MD Evaluation Airway: WNL Heart:  WNL Abdomen: WNL Chest/ Lungs: WNL ASA  Classification: 3 Mallampati/Airway Score: Two  Physical Exam: BP (!) 144/89 (BP Location: Right Arm)   Pulse 67   Temp 98.4 F (36.9 C)   Resp 17   Ht 5' 5"  (1.651 m)   Wt 212 lb (96.2 kg)   SpO2 98%   BMI 35.28 kg/m  Body mass index is 35.28 kg/m. General: pleasant, obese black female who is laying in bed in NAD HEENT: head is normocephalic, atraumatic.  Sclera are noninjected.  PERRL.  Ears and nose without any masses or lesions.  Mouth is pink and moist Heart: regular, rate, and rhythm.  Normal s1,s2. No obvious murmurs, gallops, or rubs noted.  Palpable radial and pedal pulses bilaterally Lungs: CTAB, no wheezes, rhonchi, or rales noted.  Respiratory effort nonlabored Abd: soft, NT, ND, +BS, no masses, hernias, or organomegaly Psych: A&Ox3 with an appropriate affect.   Labs: Results for orders placed or performed during the hospital encounter of 12/05/16 (from the past 48 hour(s))  APTT     Status: None   Collection Time: 12/05/16 10:23 AM  Result Value Ref Range   aPTT 28 24 - 36 seconds  Basic metabolic panel     Status: Abnormal   Collection Time: 12/05/16 10:23 AM  Result Value Ref Range   Sodium 138 135 - 145 mmol/L   Potassium 4.1 3.5 - 5.1 mmol/L   Chloride 103 101 - 111 mmol/L   CO2 24 22 - 32 mmol/L   Glucose, Bld 111 (H) 65 - 99 mg/dL   BUN 14 6 - 20 mg/dL   Creatinine, Ser 1.04 (H) 0.44 - 1.00 mg/dL   Calcium 9.6 8.9 - 10.3 mg/dL   GFR calc non Af Amer >60 >60 mL/min   GFR calc Af Amer >60 >60 mL/min    Comment: (NOTE) The eGFR has been calculated using the CKD EPI equation. This calculation has not been validated in all clinical situations. eGFR's persistently <60 mL/min signify possible Chronic Kidney Disease.    Anion gap 11 5 - 15  CBC     Status: None   Collection Time: 12/05/16 10:23 AM  Result Value Ref Range   WBC 8.7 4.0 - 10.5 K/uL   RBC 4.99 3.87 - 5.11 MIL/uL   Hemoglobin 13.3 12.0 - 15.0 g/dL    HCT 41.4 36.0 - 46.0 %   MCV 83.0 78.0 - 100.0 fL   MCH 26.7 26.0 - 34.0 pg   MCHC 32.1 30.0 - 36.0 g/dL   RDW 14.9 11.5 - 15.5 %   Platelets 279 150 - 400 K/uL  Protime-INR     Status: None   Collection Time: 12/05/16 10:23 AM  Result Value Ref Range   Prothrombin Time 12.3 11.4 - 15.2 seconds   INR 0.92     Imaging: No results found.  Assessment/Plan 1. S/p (L) MCA stenting for stenosis -we will plan to proceed with a 3 month follow up angiogram today to evaluate the stent. -labs and vitals have been reviewed -Risks and Benefits discussed with the patient including, but not limited to bleeding, infection, vascular injury or contrast induced renal failure. All of the patient's questions were answered, patient is agreeable to proceed. Consent signed and in chart.  Thank you for this interesting consult.  I greatly enjoyed meeting Patricia [name] and look forward to participating in their care.  A copy of this report was sent to the requesting provider on this date.  Electronically Signed: Henreitta Cea 12/05/2016, 12:03 PM   I spent a total of    25 Minutes in face to face in clinical consultation, greater than 50% of which was counseling/coordinating care for (L) MCA stenosis

## 2016-12-07 ENCOUNTER — Encounter (HOSPITAL_COMMUNITY): Payer: Self-pay | Admitting: Interventional Radiology

## 2016-12-08 ENCOUNTER — Telehealth: Payer: Self-pay | Admitting: Neurology

## 2016-12-08 NOTE — Telephone Encounter (Signed)
Pt called says she has awoken everyday this week with HA. She has been taking zoloft as directed and percocet daily to function. Please call

## 2016-12-08 NOTE — Telephone Encounter (Signed)
Patient returned call. Informed her of Dr Trevor MaceAhern's message. Patient stated that she did not want steroids due to making her blood sugars run high. She requested earlier FU with Aundra MilletMegan, NP; rescheduled for next Tues. Advised her to call Monday if she still has headache and wants migraine cocktail. She verbalized understanding, appreciation.

## 2016-12-08 NOTE — Telephone Encounter (Signed)
We can call in a steroid taper for her please call and discuss with patient. Otherwise we could have her in for a migraine cocktail next week. Would u offer her a follow up with Megan please?

## 2016-12-08 NOTE — Telephone Encounter (Signed)
LVM with Dr Trevor MaceAhern's detailed message. Left office number and advised her we close at noon today.

## 2016-12-11 ENCOUNTER — Ambulatory Visit: Payer: BLUE CROSS/BLUE SHIELD | Admitting: Neurology

## 2016-12-12 ENCOUNTER — Encounter: Payer: Self-pay | Admitting: Adult Health

## 2016-12-12 ENCOUNTER — Ambulatory Visit (INDEPENDENT_AMBULATORY_CARE_PROVIDER_SITE_OTHER): Payer: BLUE CROSS/BLUE SHIELD | Admitting: Adult Health

## 2016-12-12 ENCOUNTER — Telehealth: Payer: Self-pay | Admitting: Neurology

## 2016-12-12 VITALS — BP 115/82 | HR 79 | Ht 65.0 in | Wt 206.8 lb

## 2016-12-12 DIAGNOSIS — G43011 Migraine without aura, intractable, with status migrainosus: Secondary | ICD-10-CM

## 2016-12-12 MED ORDER — GABAPENTIN 100 MG PO CAPS: 100.0000 mg | ORAL_CAPSULE | Freq: Every day | ORAL | 5 refills | Status: DC

## 2016-12-12 NOTE — Telephone Encounter (Signed)
This patient was in office today and I was asked by Aundra MilletMegan and Andrey CampanileSandy to schedule her for an injection before she left. Albin FellingCarla came to my door to let me know the patient was at check out. I was on the phone for another patient and asked Albin FellingCarla to let her know that I would be out there in one minute. The patient stated that "she had to eat and couldn't wait" and she left. I called the patient to schedule her injection but she did not answer so I left a VM asking for her to call back. I will try her again at a later time.

## 2016-12-12 NOTE — Patient Instructions (Signed)
Start Gabapentin 100 mg at bedtime Will speak to our botox coordinator about getting you started with Botox Migraine infusion today If your symptoms worsen or you develop new symptoms please let us know.

## 2016-12-12 NOTE — Progress Notes (Signed)
PATIENT: Patricia [name] DOB: [birth year]  REASON FOR VISIT: follow up- migraine headaches HISTORY FROM: patient  HISTORY OF PRESENT ILLNESS:  Today 12/12/16: Patricia [name] is a 51 year old female with a history of migraine headaches. She returns today for follow-up. At the last visit she was started on Cymbalta and referred for Botox therapy. She reports that the Cymbalta made her nauseous so she had to stop this medication. She also stopped Lamictal as it was not offering her any benefit. The patient has tried and failed multiple drugs in the past. She was referred for Botox however she states that she has not received a call to set this up. The patient reports that she has daily headaches. Normally occurs in the back of the head on the right side or behind the right eye. She states that smells bother her. Denies photophobia and phonophobia. She does have nausea and occasional vomiting. Denies any visual disturbance. The patient reports that she has a headache today. On a scale 1-10 her headaches a 7. The patient reports that she is interested in trying Botox therapy. She returns today for an evaluation.  HISTORY (Dr. Lucia Gaskins notes) 09/13/16: Patricia [name] is a 51 y.o. female here as a referral from Dr. Ludwig Clarks for headache. PMH of obesity, hypertension, diabetes, hyperlipidemia, previous embolic stroke in 2009 presumably due to PFO/ASD state post of closure by Dr. Jacinto Halim was admitted on 05/22/14 for right arm and leg numbness weakness, difficulty with speech. MRI had showed left MCA territory small multiple infarcts. MRA head showed significant intracranial stenosis. She was discharged on aspirin and Plavix dural antiplatelet therapy and outpatient PT/OT. She has headache for the last 30 years. the pain are at the back of her head at skull base line, with pain at the back of right eye. 2-3 per month, usually lasting for 2 days. 10/10, phonophobia but no photophobia, nausea but no vomiting.  She has daily HA, has tried inderal, depakote, amitriptyline, topamax, imitrex, botox but none of them worked either due to side effects or ineffectiveness. She also had LP for CSF drainage due to ? Increased intracranial pressure, but still not effective. She denies tension type HA.   Headaches started 30 years ago. Her aunt, son have migraines. Headaches very, sometimes she feel like someone is hitting her on the head, behind the eyes, and the back of the right side. Like someone stabbing her behind the eye or like an axe on the top of her head.  She denies denies any lacrimation, injection or other autonomic associated symptoms. When the stabbing behind the eye comes it can last a few hours. The stabbing headache is diffferent than the pther headaches. The pain in the back of the head is pounding and she feels an axe on the top of her head sometimes all of these are together but sometimes the eye stabbing pain is separate. She has had a current headache since yesterday. Today she has pounding in the right back of the head. She has most days so 25/30 day of the month.  She has light sensitivity, smell sensitivy, perfumes are triggers. She also has nausea and vomiting. Headaches can last days. Most headaches are migrainous at least 20/20 days in a month. No aura. No medication overuse.  She was seeing [facility] for headaches, Dr. Milford Lions for headaches for a long time (will request records). She was diagnosed with migraines. Sh ehad a sleep study and a lumbar puncture. She saw him many years ago  since 2009. She tired Topamax and worsened memory loss. The depakote did not work. Amitriptyline recently and did not work, it was not controlling the headaches. Lamictal is not helping either.   Medications tried: Inderal, depakote, amitriptyline, topamax, imitrex, botox. Effexor, lexapro  REVIEW OF SYSTEMS: Out of a complete 14 system review of symptoms, the patient complains only of the following  symptoms, and all other reviewed systems are negative.  Blurred vision, dizziness, headache  ALLERGIES: Allergies  Allergen Reactions  . Imitrex [Sumatriptan] Anaphylaxis  . Triptans Anaphylaxis and Hives  . Plavix [Clopidogrel Bisulfate] Hives  . Cymbalta [Duloxetine Hcl] Nausea Only  . Topamax [Topiramate]     Memory issues  . Lipitor [Atorvastatin] Rash    HOME MEDICATIONS: Outpatient Medications Prior to Visit  Medication Sig Dispense Refill  . aspirin EC 81 MG tablet Take 81 mg by mouth at bedtime.    . botulinum toxin Type A (BOTOX) 100 units SOLR injection Inject  IM by provider in office every 3 months (Patient not taking: Reported on 11/14/2016) 2 vial 3  . chlorthalidone (HYGROTON) 25 MG tablet Take 25 mg by mouth at bedtime.   5  . diphenhydramine-acetaminophen (TYLENOL PM) 25-500 MG TABS tablet Take 2 tablets by mouth at bedtime as needed.    . DULoxetine (CYMBALTA) 60 MG capsule Take 1 capsule (60 mg total) by mouth daily. (Patient not taking: Reported on 11/14/2016) 30 capsule 6  . Empagliflozin-Metformin HCl (SYNJARDY) 12.02-999 MG TABS Take 1 tablet by mouth 2 (two) times daily.     Marland Kitchen lamoTRIgine (LAMICTAL) 25 MG tablet 25mg  Qhs x 2 wks, then 25mg  bid x 2 wks, then 25mg  am/50mg  pm x 2 wks, then 50mg  bid x 2 wks, then 50mg  am/100mg  pm x 2 wks, then 100mg  bid (Patient not taking: Reported on 11/14/2016) 240 tablet 3  . Nebivolol HCl (BYSTOLIC) 20 MG TABS Take 20 mg by mouth every evening.     . ondansetron (ZOFRAN ODT) 4 MG disintegrating tablet Take 1 tablet (4 mg total) by mouth every 8 (eight) hours as needed for nausea or vomiting. 10 tablet 0  . oxyCODONE-acetaminophen (PERCOCET) 10-325 MG tablet Take 1 tablet by mouth every 4 (four) hours as needed for pain.    . pravastatin (PRAVACHOL) 40 MG tablet Take 40 mg by mouth at bedtime.    . promethazine (PHENERGAN) 25 MG tablet Take 25 mg by mouth 3 (three) times daily as needed for nausea or vomiting.     . sertraline  (ZOLOFT) 50 MG tablet Take 50 mg by mouth at bedtime.    [name] 100-33 UNT-MCG/ML SOPN INJECT 32 UNITS UNDER THE SKIN DAILY in the evening AS NEEDED FOR BLOOD SUGAR  6  . ticagrelor (BRILINTA) 90 MG TABS tablet Take 1 tablet (90 mg total) by mouth 2 (two) times daily. (Patient taking differently: Take 45-90 mg by mouth 2 (two) times daily. Take 1/2 tablet (45mg ) in the morning, and 1 tablet (90mg ) at night) 60 tablet 2  . tiZANidine (ZANAFLEX) 4 MG tablet Take 4 mg by mouth every 8 (eight) hours as needed (migraines).      No facility-administered medications prior to visit.     PAST MEDICAL HISTORY: Past Medical History:  Diagnosis Date  . Anemia    in the past  . Asthma    " a long time ago"  . Bursitis   . Depression    13 years ago -   . Diabetes mellitus without complication ([facility])  type  2  . GERD (gastroesophageal reflux disease)    occ. acid reflux  . Headache    migraines  . Hypertension   . Kidney stones   . PFO (patent foramen ovale)    hx of PFO with surgery done as an adult  . Rotator cuff tear    right  . Stroke [facility]) 2009   lasdt stroke July 2015  . Syncope and collapse     PAST SURGICAL HISTORY: Past Surgical History:  Procedure Laterality Date  . ABDOMINAL HYSTERECTOMY    . BREAST REDUCTION SURGERY    . CARDIAC CATHETERIZATION    . CARDIAC SURGERY  Closed hole in heart in 2009   Amplatzer Septal Occluder Ref [id number] (not listed in op notes, document scanned under MRI with written op notes  . HYSTEROTOMY    . IR GENERIC HISTORICAL  06/23/2016   IR ANGIO INTRA EXTRACRAN SEL COM CAROTID INNOMINATE BILAT MOD SED 06/23/2016 Julieanne Cotton, MD [facility]INTER[facility]  . IR GENERIC HISTORICAL  06/23/2016   IR ANGIO VERTEBRAL SEL VERTEBRAL BILAT MOD SED 06/23/2016 Julieanne Cotton, MD [facility]  . IR GENERIC HISTORICAL  06/29/2016   IR RADIOLOGIST EVAL & MGMT 06/29/2016 [facility]  . IR GENERIC HISTORICAL  07/24/2016   IR INTRA CRAN STENT 07/24/2016 Julieanne Cotton, MD [facility]  . IR GENERIC HISTORICAL  07/28/2016   IR PTA INTRACRANIAL 07/28/2016 Julieanne Cotton, MD [facility]  . IR GENERIC HISTORICAL  07/28/2016   IR ENDOVASC INTRACRANIAL INF OTHER THAN THROMBO ART INC DIAG ANGIO 07/28/2016 Julieanne Cotton, MD [facility]  . IR GENERIC HISTORICAL  08/07/2016   IR RADIOLOGIST EVAL & MGMT 08/07/2016 [facility]  . IR GENERIC HISTORICAL  12/05/2016   IR ANGIO INTRA EXTRACRAN SEL COM CAROTID INNOMINATE BILAT MOD SED 12/05/2016 Julieanne Cotton, MD [facility]  . IR GENERIC HISTORICAL  12/05/2016   IR ANGIO VERTEBRAL SEL SUBCLAVIAN INNOMINATE UNI R MOD SED 12/05/2016 Julieanne Cotton, MD [facility]  . IR GENERIC HISTORICAL  12/05/2016   IR ANGIO VERTEBRAL SEL VERTEBRAL UNI L MOD SED 12/05/2016 Julieanne Cotton, MD [facility]  . RADIOLOGY WITH ANESTHESIA [name] 07/24/2016   Procedure: Stenting;  Surgeon: Julieanne Cotton, MD;  [facility];  Service: Radiology;  Laterality: [name];  . RADIOLOGY WITH ANESTHESIA [name] 07/28/2016   Procedure: RADIOLOGY WITH ANESTHESIA;  Surgeon: Medication Radiologist, MD;  [facility];  Service: Radiology;  Laterality: [name];  . SHOULDER ARTHROSCOPY WITH OPEN ROTATOR CUFF REPAIR AND DISTAL CLAVICLE ACROMINECTOMY Right 02/04/2015   Procedure: SHOULDER ARTHROSCOPY WITH SUBACROMIAL DECOMPRESSION AND OPEN ROTATOR CUFF REPAIR,;  Surgeon: Cammy Copa, MD;  [facility];  Service: Orthopedics;  Laterality: Right;  RIGHT SHOULDER DOA,SAD,MINI-OPEN ROTATOR CUFF TEAR REPAIR.    FAMILY HISTORY: Family History  Problem Relation Age of Onset  . Hypertension Mother   . Hypertension Father   . Cancer Maternal Aunt   . Cancer Maternal Uncle     SOCIAL HISTORY: Social History   Social History  . Marital status: Single    Spouse name: [name]  . Number of children: 4  . Years of education: College    Occupational History  . 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 Smoking status: Never  Smoker  . Smokeless tobacco: Never Used  . Alcohol use 0.0 oz/week     Comment: occasional  . Drug use: No  . Sexual activity: No   Other Topics Concern  . Not  on file   Social History Narrative   Patient is single with 4 children.   Patient is right handed.   Patient has college education.   Patient drinks 1 cup daily.      PHYSICAL EXAM  Vitals:   12/12/16 1120  BP: 115/82  Pulse: 79  Weight: 206 lb 12.8 oz (93.8 kg)  Height: 5\' 5"  (1.651 m)   Body mass index is 34.41 kg/m.  Generalized: Well developed, in no acute distress   Neurological examination  Mentation: Alert oriented to time, place, history taking. Follows all commands speech and language fluent Cranial nerve II-XII: Pupils were equal round reactive to light. Extraocular movements were full, visual field were full on confrontational test. Facial sensation and strength were normal. Uvula tongue midline. Head turning and shoulder shrug  were normal and symmetric. Motor: The motor testing reveals 5 over 5 strength of all 4 extremities. Good symmetric motor tone is noted throughout.  Sensory: Sensory testing is intact to soft touch on all 4 extremities. No evidence of extinction is noted.  Coordination: Cerebellar testing reveals good finger-nose-finger and heel-to-shin bilaterally.  Gait and station: Gait is normal.  Reflexes: Deep tendon reflexes are symmetric and normal bilaterally.   DIAGNOSTIC DATA (LABS, IMAGING, TESTING) - I reviewed patient records, labs, notes, testing and imaging myself where available.  Lab Results  Component Value Date   WBC 8.7 12/05/2016   HGB 13.3 12/05/2016   HCT 41.4 12/05/2016   MCV 83.0 12/05/2016   PLT 279 12/05/2016      Component Value Date/Time   NA 138 12/05/2016 1023   K 4.1 12/05/2016 1023   CL 103 12/05/2016 1023   CO2 24 12/05/2016 1023   GLUCOSE 111 (H) 12/05/2016 1023   BUN 14 12/05/2016 1023   CREATININE 1.04 (H) 12/05/2016 1023   CALCIUM 9.6  12/05/2016 1023   PROT 6.8 11/14/2016 1333   ALBUMIN 3.5 11/14/2016 1333   AST 41 11/14/2016 1333   ALT 34 11/14/2016 1333   ALKPHOS 125 11/14/2016 1333   BILITOT 0.3 11/14/2016 1333   GFRNONAA >60 12/05/2016 1023   GFRAA >60 12/05/2016 1023   Lab Results  Component Value Date   CHOL 121 07/29/2016   HDL 39 (L) 07/29/2016   LDLCALC 52 07/29/2016   TRIG 149 07/29/2016   CHOLHDL 3.1 07/29/2016   Lab Results  Component Value Date   HGBA1C 7.2 (H) 07/29/2016   No results found for: VITAMINB12 No results found for: TSH    ASSESSMENT AND PLAN 51 y.o. year old female  has a past medical history of Anemia; Asthma; Bursitis; Depression; Diabetes mellitus without complication ([facility]); GERD (gastroesophageal reflux disease); Headache; Hypertension; Kidney stones; PFO (patent foramen ovale); Rotator cuff tear; Stroke [facility]) (2009); and Syncope and collapse. here with:  1. Migraine headache  The patient is having daily migraine headaches. Today she has a headache that she rates on the pain scale a 7. We will get the patient set up for a migraine infusion today including Depakote, Solu-Medrol and Toradol. She will be given a prescription of gabapentin 100 mg at bedtime. In the past she has tried the 300 mg tablets but they made her very drowsy. We will also try to get the patient set up for Botox. Patient is amenable to this plan. She will follow-up in 6 months with Dr. Lucia GaskinsAhern.    Butch PennyMegan Xiamara Hulet, MSN, NP-C 12/12/2016, 11:24 AM [facility] [address], [address] [place], [place] [address] [phone]

## 2016-12-12 NOTE — Telephone Encounter (Signed)
Called the patient again regarding botox injections, left a VM asking her to call me back.

## 2016-12-19 ENCOUNTER — Ambulatory Visit: Payer: BLUE CROSS/BLUE SHIELD | Admitting: Neurology

## 2016-12-25 ENCOUNTER — Ambulatory Visit (INDEPENDENT_AMBULATORY_CARE_PROVIDER_SITE_OTHER): Payer: BLUE CROSS/BLUE SHIELD | Admitting: Neurology

## 2016-12-25 ENCOUNTER — Encounter: Payer: Self-pay | Admitting: Neurology

## 2016-12-25 VITALS — BP 120/68 | HR 80 | Wt 211.6 lb

## 2016-12-25 DIAGNOSIS — I10 Essential (primary) hypertension: Secondary | ICD-10-CM

## 2016-12-25 DIAGNOSIS — I63512 Cerebral infarction due to unspecified occlusion or stenosis of left middle cerebral artery: Secondary | ICD-10-CM

## 2016-12-25 DIAGNOSIS — G43011 Migraine without aura, intractable, with status migrainosus: Secondary | ICD-10-CM | POA: Diagnosis not present

## 2016-12-25 DIAGNOSIS — E118 Type 2 diabetes mellitus with unspecified complications: Secondary | ICD-10-CM | POA: Diagnosis not present

## 2016-12-25 DIAGNOSIS — Z794 Long term (current) use of insulin: Secondary | ICD-10-CM | POA: Diagnosis not present

## 2016-12-25 DIAGNOSIS — E785 Hyperlipidemia, unspecified: Secondary | ICD-10-CM

## 2016-12-25 NOTE — Patient Instructions (Addendum)
-   continue ASA and brilinta and prevastatin for stroke prevntion - check BP and glucose at home.  - follow with Dr. Lucia GaskinsAhern for headache recommendations  - recommend more activity at home  - Follow up with your primary care physician for stroke risk factor modification. Recommend maintain blood pressure goal 120-140/80, diabetes with hemoglobin A1c goal below 7.0% and lipids with LDL cholesterol goal below 70 mg/dL.  - follow up with Dr. Corliss Skainseveshwar as scheduled. - follow up in 4 months with me

## 2016-12-25 NOTE — Progress Notes (Signed)
STROKE NEUROLOGY FOLLOW UP NOTE  NAME: Patricia [name] DOB: Mar 14, 1966  REASON FOR VISIT: stroke follow up HISTORY FROM: pt and chart  Today we had the pleasure of seeing Patricia [name] in follow-up at our [facility]. Pt was accompanied by daughter and granddaughter.   History Summary 51 y.o. African American female with PMH of obesity, hypertension, diabetes, hyperlipidemia, previous embolic stroke in 2009 presumably due to PFO/ASD state post of closure by Dr. Jacinto Halim was admitted on 05/22/14 for right arm and leg numbness weakness, difficulty with speech. MRI had showed left MCA territory small multiple infarcts. MRA head showed significant intracranial stenosis. She was discharged on aspirin and Plavix dural antiplatelet therapy and outpatient PT/OT.  She had a previous CVA in 2009, MRI showed multiple bilateral small infarcts consistent with cardioembolic stroke. She was found by PFO/ASD, state post of a PFO closure with Dr. Jacinto Halim. She stated she was treated with aspirin and Plavix for some time and then Dr. Jacinto Halim took her off Plavix. She had no residual deficit left from previous stroke.  She had multiple stroke risk factors including hypertension, hyperlipidemia, diabetes, obesity. She stated that she was not a compliant with medication in the past and he now watch for her diet.  She denies any cigarette smoking, alcohol drinking, or illicit drugs.  06/17/14 follow up -  the patient has been doing well. She still not able to write with right hand, and she is getting PT/OT 2/wk. Her BP at home was OK, today in clinic 146/93. Her BP has been low lately so Dr. Jacinto Halim decreased her bystolic. She saw her PCP early this month and changed her from plavix to crestor due to questionable rashes. Continue her on PO meds for DM and Sugar this morning 117.   08/27/14 follow up - she was doing well. Her BP today in clinic was 115/80 and she stated that her sugar level at home was 113,  190, 130 for the last 3 days. Her CTA head and neck was stable comparing with prior, still showing left MCA M1 high grade stenosis as well as bilateral ACA stenosis with diffuse intracranial atherosclerosis. Vasculitis work up negative. TCD bubble study consistent with PFO s/p closure. She has allergic reaction to plavix, so PCP put her on ASA 81mg  bid and cilostazol for dural antiplatelet.  However, she complained in clinic today about her headache. She had headache for the last 30 years, the pain are at the back of her head at skull base line, with pain at the back of right eye. 2-3 per month, usually lasting for 2 days. 10/10, phonophobia but no photophobia, nausea but no vomiting. No neck pain. However, for the last 3 weeks, she has similar headache not go away. She takes percocet and phernagen but not effective.  09/07/15 follow up - pt has been doing the same. She had one episode of right arm and leg weakness, word finding difficulty after a long day (work that day, rushed to pick up her grandson for football game, restaurant to eat and drove back). She was admitted to OSH, CT head and MRI showed no acute abnormalities. Normal TTE. She was discharged with resolution of symptoms, likely due to recrudescence of previous stroke.   She still complains of headache with neck pain, consistent with occipital neuralgia. She had occipital nerve block last visit, but she said that was not very effective. She was also on amitriptyline for the headache but she stopped it as it was  also not effective. Currently she was put on fioricet and percocet for the headache with aware of rebound headache. I recommended to her pain management referral to consider radioablation of occipital nerve.   03/23/16 follow up - pt has been doing fine from stroke standpoint. She recently complains that she has the episodes of difficulty speaking, getting words out, on and off, increasingly frequent. She has similar episodes 07/2015 and  MRI negative for stroke. She again had another MRI 12/2015 and negative for stroke. Her symptoms are likely recrudescence from her previous strokes in the setting of fatigue, stress, anxiety, depression, infection and etc. However, she stated that the episodes happened randomly, not associated with above triggers. She also denies association with HA or stress. However, she stated that the episodes seem to happen when her BP was low, as low as 100/60. Currently in clinic, she has no deficit and still in depressed mood.  She also complains of HA which has been going on for long time. She has daily HA, has tried inderal, depakote, amitriptyline, topamax, imitrex, botox but none of them worked either due to side effects or ineffectiveness. She also had LP for CSF drainage due to ? Increased intracranial pressure, but still not effective. She denies tension type HA.   08/23/16 follow up - pt had EEG for speech difficulty episode work up which was negative. However, pt started to feel right hand weakness and intermittent word finding difficulties in 05/2016. Repeat MRI showed punctate left MCA stroke, same distribution as her stroke 2 years ago. She does have high grade stenosis of left M1, but denies any low BP or dehydration. Due to recurrent stroke at same territory of high grade stenosis of left M1, she was referred to Dr. Corliss Skains for consideration of intracranial stent. She had left MCA stent on 07/24/16, and put on ASA and brilinta. However, she was readmitted on 07/28/16 for left sided weakness and dysarthria. CTA head and neck showed nonocclusive thrombus in the left MCA stent. MRI showed patchy left MCA cortical infarcts. Pt underwent angioplasty for in-stent stenosis. LDL 52 and A1C 7.4. She was continued on ASA and brilinta and pravastatin on discharge.  Currently still has right facial droop and mild right hand dexterity difficulty, not able to write well. Low energy level and easily tired. Finished home  PT/OT and on home exercise now.   She still complains of HA since last week. In the past has tried topamax but stopped due to cognitive issue. Has tried inderal long time ago, not effective and currently she is on multiple BP meds including beta bloker. She has tried keppra, depakote, neurontin and amitriptyline all not effective. Like to avoid imitrex due to stroke. Has tried occipital nerve block but not effective. On lamictal 100mg  bid now. Had botox for HA but not effective also.   Interval History During the interval time, pt has been doing well. She had one episode of N/V and HA on 11/14/16 and she went to ED, had CT head which showed no acute abnormalities. Her symptoms resolved and she was discharged home. She followed with Dr. Corliss Skains on 12/05/16 and repeat angiogram showed approximately 70-75% stenosis within the mid portion of the stented segment of the left MCA most likely secondary to a combination of near intimal hyperplasia and atherosclerotic plaque. Recommended MRA and MRI in 6 months. She saw Dr. Lucia Gaskins on 09/13/16 for HA management and will get botox tomorrow. BP at home less than 130, and 130/68 today, glucose at  home 125-136. She is back to work, right hand function continues to improve and language now normal.   REVIEW OF SYSTEMS: Full 14 system review of systems performed and notable only for those listed below and in HPI above, all others are negative:   Restless leg, HA, speech difficulty, weakness, facial drooping  The following represents the patient's updated allergies and side effects list: Allergies  Allergen Reactions  . Imitrex [Sumatriptan] Anaphylaxis  . Triptans Anaphylaxis and Hives  . Plavix [Clopidogrel Bisulfate] Hives  . Cymbalta [Duloxetine Hcl] Nausea Only  . Topamax [Topiramate]     Memory issues  . Lipitor [Atorvastatin] Rash   The neurologically relevant items on the patient's problem list were reviewed on today's visit.  Neurologic Examination  A  problem focused neurological exam (12 or more points of the single system neurologic examination, vital signs counts as 1 point, cranial nerves count for 8 points) was performed.  Blood pressure 120/68, pulse 80, weight 211 lb 9.6 oz (96 kg).  General - obese, well developed, depressed mood.  Ophthalmologic - Sharp disc margins OU.  Cardiovascular - Regular rate and rhythm with no murmur.  Mental Status -  Level of arousal and orientation to time, place, and person were intact. Language including expression, naming, repetition, comprehension, reading, and writing was assessed and found intact.  Cranial Nerves II - XII - II - Visual field intact OU. III, IV, VI - Extraocular movements intact. V - Facial sensation intact bilaterally. VII - mild right nasolabial fold flattening. VIII - Hearing & vestibular intact bilaterally. X - Palate elevates symmetrically. XI - Chin turning & shoulder shrug intact bilaterally. XII - Tongue protrusion intact.  Motor Strength - The patient's strength was normal in all extremities except subtle decreased right hand dexterity.  Bulk was normal and fasciculations were absent.   Motor Tone - Muscle tone was assessed at the neck and appendages and was normal.  Reflexes - The patient's reflexes were normal extremities and she had no pathological reflexes.  Sensory - Light touch, temperature/pinprick and Romberg testing were assessed and were normal.    Coordination - The patient had normal movements in the hands and feet with no ataxia or dysmetria.  Tremor was absent.  Gait and Station - The patient's transfers, posture, gait, station, and turns were observed as normal.  Data reviewed: I personally reviewed the images and agree with the radiology interpretations.  Mr Shirlee LatchMra Head Wo Contrast  05/22/2014  Multiple acute infarctions in the left hemisphere that could be a combination of watershed infarctions and micro embolic infarctions.   MRA    05/22/2014  Advanced intracranial disease with very limited flow and the anterior cerebral artery territories and multiple middle cerebral stenoses, particularly severe and notable at the M1 segment on the left with the vessel shows 90% stenosis. Distal branch vessels appear reduced in number an show irregularity. The findings could be due to widespread advanced atherosclerotic disease or vasculitis. This represents a marked worsening of disease since 2009. Old infarctions affecting the thalami, hemispheric white matter, genu of the corpus callosum and right parietal cortex at the vertex.   Dg Chest Port 1 View  05/22/2014  There is no active cardiopulmonary disease.   Carotid Doppler Preliminary report: 1-39% ICA stenosis. Vertebral artery flow is antegrade.  2D Echocardiogram Left ventricle: The cavity size was at the upper limits ofnormal. Systolic function was normal.  The estimated ejection fraction was in the range of 55% to 60%. Wall motion was  normal; there were no regional wall motion abnormalities. - Atrial septum: There is no color Doppler e/o residual inter atrial shunting. Flow present within the right atrial disk without crossing the septum (normal).  An Amplatzer closure device in the fossa ovalis region was present.  There was no atrial level shunt. No evidence of thrombus.  CTA head and neck 07/03/14 1. Severe proximal left MCA stenosis. Mild proximal right MCA stenosis. Bilateral MCA branch vessel irregularity and attenuation. Occlusion of the anterior cerebral arteries at their origins with some reconstituted but severely diminished flow distally. Mild left P2 stenosis. Considerations again include advanced atherosclerosis and vasculitis. 2. No evidence of cervical carotid or vertebral artery stenosis.  TCD bubble study 07/06/14 Both middle cerebral arteries could not be insonated using a headset due to poor bitemporal windows and procedure was done insonating left vertebral  artery with a hand held probe. And IV line was inserted in the left elbow by the RN using aseptic precautions. Agitated saline injection at rest and after valsalva maneuver did result in several high intensity transient signals (HITS).  Impression: Positive Transcranial Doppler Bubble Study indicative of small residual right to left intracardiac shunt despite endovascular PFO closure.   MRI and MRA head 02/2015 -  MRI HEAD: No acute intracranial process, specifically no acute Infarct. Multifocal small areas of encephalomalacia corresponding to prior LEFT cerebral remote infarcts. Remote bilateral basal ganglia and thalamus lacunar infarcts. Moderate white matter changes suggest chronic small vessel ischemic disease.  MRA HEAD: Focal high-grade stenosis of LEFT M1 segment, stable. Chronically occluded RIGHT anterior cerebral artery, with thready irregular LEFT anterior cerebral artery, unchanged. Thready, somewhat narrowed basilar artery, worse than prior examination though this may be accentuated by motion artifact. Moderate stenosis LEFT P2 segment, worse than prior imaging. Improved appearance of the LEFT middle cerebral artery from prior imaging, suggesting prior vasculitis.  MRI brain 01/05/16 No acute or subacute infarction. Old infarctions affecting the thalami, basal ganglia, genu of the corpus callosum, hemispheric white matter and left and right cortical and subcortical infarctions at the vertex.  EEG 05/23/16 -  Normal EEG in the awake and drowsy state.  MR Brain Wo Contrast  07/29/2016 1. Patchy multi focal acute ischemic predominantly cortical left MCA territory infarcts as above. Associated petechial hemorrhage without frank hemorrhagic transformation. No significant mass effect. 2. Mild chronic microvascular ischemic disease with additional scattered remote lacunar infarcts as above.  Ct Angio Head and Neck W Or Wo Contrast 07/28/2016 1.Possible nonocclusive thrombus in  the left MCA stent.Abnormally prolonged transit throughout the left MCA superior division without evidence of acute core infarct or large vessel occlusion.  2. Chronically occluded or severely diseased A1 segments.  3. Mild right M1 MCA stenosis.   Ct Head Wo Contrast 07/28/2016 1. No acute intracranial abnormalities.  2. 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 Minor chronic microvascular ischemic change.  3. Left middle cerebral artery stent new since the prior study. No other change.   Ct Cerebral Perfusion W Contrast 07/28/2016 1. Possible nonocclusive thrombus in the left MCA stent. Abnormally prolonged transit throughout the left MCA superior division without evidence of acute core infarct or large vessel occlusion.  2. Chronically occluded or severely diseased A1 segments.  3. Mild right M1 MCA stenosis.   12/05/16 Approximately 70-75% stenosis within the mid portion of the stented segment of the left middle cerebral artery most likely secondary to a combination of near intimal hyperplasia and atherosclerotic plaque. Hypoplastic right anterior cerebral artery  A1 segment. Nonvisualization of the left anterior cerebral artery A1 segment. Dominant posterior communicating arteries bilaterally.   Component     Latest Ref Rng 05/22/2014 05/23/2014 06/17/2014  PTT Lupus Anticoagulant     28.0 - 43.0 secs 32.0    PTTLA Confirmation     <8.0 secs NOT APPL    PTTLA 4:1 Mix     28.0 - 43.0 secs NOT APPL    DRVVT     <42.9 secs 36.1    Drvvt confirmation     <1.15 Ratio NOT APPL    dRVVT Incubated 1:1 Mix     <42.9 secs NOT APPL    Lupus Anticoagulant     NOT DETECTED NOT DETECTED    ENA RNP Ab     0.0 - 0.9 AI   <0.2  ENA SM Ab Ser-aCnc     0.0 - 0.9 AI   <0.2  Rhuematoid fact SerPl-aCnc     0.0 - 13.9 IU/mL   8.3  Chromatin Ab SerPl-aCnc     0.0 - 0.9 AI   <0.2  ENA SSA (RO) Ab     0.0 - 0.9 AI   0.8  ENA SSB (LA) Ab     0.0 - 0.9 AI   0.2  dsDNA Ab     0  - 9 IU/mL   <1  Cholesterol     0 - 200 mg/dL  782   Triglycerides     <150 mg/dL  956   HDL     >21 mg/dL  38 (L)   Total CHOL/HDL Ratio       4.7   VLDL     0 - 40 mg/dL  23   LDL (calc)     0 - 99 mg/dL  308 (H)   Interpretation-PTGENE:      REPORT    Recommendations-PTGENE:      REPORT    Reviewer      REPORT    Beta-2 Glyco I IgG     <20 G Units 0    Beta-2-Glycoprotein I IgM     <20 M Units 1    Beta-2-Glycoprotein I IgA     <20 A Units 27 (H)    Interpretation-F5LEID:      REPORT    Recommendations-F5LEID:      REPORT    Reviewer      REPORT    Anticardiolipin IgG     <23 GPL U/mL 8 (L)    Anticardiolipin IgM     <11 MPL U/mL 0 (L)    Anticardiolipin IgA     <22 APL U/mL 7 (L)    C-ANCA     Neg:<1:20 titer   <1:20  P-ANCA     Neg:<1:20 titer   <1:20  Atypical pANCA     Neg:<1:20 titer   <1:20  Hgb A1c MFr Bld     <5.7 %  8.6 (H)   Mean Plasma Glucose     <117 mg/dL  657 (H)   Protein C Activity     75 - 133 % 154 (H)    Protein C, Total     72 - 160 % 135    Protein S Activity     69 - 129 % 51 (L)    Protein S Ag, Total     60 - 150 % 64    AntiThromb III Func     75 - 120 % 95    Homocysteine  4.0 - 15.4 umol/L 11.7    C3 Complement     90 - 180 mg/dL  161   Complement C4, Body Fluid     10 - 40 mg/dL  42 (H)   Compl, Total (CH50)     31 - 60 U/mL  >60 (H)   Sed Rate     0 - 22 mm/hr  18   ANA Ser Ql     NEGATIVE  NEGATIVE   RPR     NON REAC  NON REAC   HIV     NONREACTIVE  NONREACTIVE   Sickle Cell Prep     Negative   Negative   Component     Latest Ref Rng & Units 02/21/2015 07/03/2016 07/07/2016 07/13/2016  Cholesterol     0 - 200 mg/dL 096     Triglycerides     <150 mg/dL 045     HDL Cholesterol     >40 mg/dL 29 (L)     Total CHOL/HDL Ratio     RATIO 4.9     VLDL     0 - 40 mg/dL 28     LDL (calc)     0 - 99 mg/dL 84     Hemoglobin W0J     4.8 - 5.6 %    7.4 (H)  Mean Plasma Glucose     mg/dL    811  Platelet  Function  [name]     194 - 418 PRU  2 (L) 21 (L) 9 (L)   Component     Latest Ref Rng & Units 07/17/2016 07/21/2016 07/24/2016 07/25/2016  Cholesterol     0 - 200 mg/dL      Triglycerides     <150 mg/dL      HDL Cholesterol     >40 mg/dL      Total CHOL/HDL Ratio     RATIO      VLDL     0 - 40 mg/dL      LDL (calc)     0 - 99 mg/dL      Hemoglobin B1Y     4.8 - 5.6 %      Mean Plasma Glucose     mg/dL      Platelet Function  [name]     194 - 418 PRU 100 (L) 118 (L) 20 (L) 97 (L)   Component     Latest Ref Rng & Units 07/28/2016 07/29/2016  Cholesterol     0 - 200 mg/dL  782  Triglycerides     <150 mg/dL  956  HDL Cholesterol     >40 mg/dL  39 (L)  Total CHOL/HDL Ratio     RATIO  3.1  VLDL     0 - 40 mg/dL  30  LDL (calc)     0 - 99 mg/dL  52  Hemoglobin O1H     4.8 - 5.6 %  7.2 (H)  Mean Plasma Glucose     mg/dL  086  Platelet Function  [name]     194 - 418 PRU 116 (L)     Assessment: As you may recall, she is a 51 y.o. AA  female with PMH of obesity, hypertension, diabetes, hyperlipidemia, previous embolic stroke in 2009 presumably due to PFO/ASD s/p closure by Dr. Jacinto Halim was admitted in 04/2014 for new left MCA territory multiple small strokes. She had cardioembolic stroke in 2009 and it attributed to PFO at the time. The current stroke was  confined in the left MCA territory. She does have intracranial stenosis more prominent at left MCA M1 and bilateral ACA which has significant progress from her CTA head and neck in 2009. She does have multiple stroke risk factors including HTN, HLD, and uncontrolled DM. Repeat CTA no change from prior. TCD bubble study consistent with PFO s/p closure. Vasculitis and hypercoagulable work up negative. Finished dural antiplatelet, and kept on ASA 325mg  as she has allergic reaction to plavix.   During the interval time, pt continues to complain intermittent speech difficulty, had MRI again 12/2015 negative. EEG no seizure. However, in 05/2016 pt  had right side weakness and dysarthria. MRI showed left MCA patchy cortical infarct. CTA head and neck showed left M1 high grade stenosis. she was referred to Dr. Corliss Skains for consideration of intracranial stent. She had left MCA stent on 07/24/16, put on ASA and brilinta. However, she was readmitted on 07/28/16 for nonocclusive thrombus in the left MCA stent. MRI showed patchy left MCA cortical infarcts. Pt underwent angioplasty for in-stent stenosis. LDL 52 and A1C 7.4. She was continued on ASA and brilinta and pravastatin on discharge.  Currently still has right facial droop and mild right hand dexterity difficulty, not able to write well, but language intact. Followed with Dr. Corliss Skains and repeat cerebral angio on 12/05/16 showed 70-75% in stent stenosis. Plan to do MRI and MRA in 6 months.   She still complains of HA. In the past has tried topamax but stopped due to cognitive issue. Has tried inderal long time ago, not effective and currently she is on multiple BP meds including beta bloker. She has tried keppra, depakote, neurontin and amitriptyline all not effective. Like to avoid imitrex due to stroke. Has tried occipital nerve block but not effective. On lamictal 100mg  bid now. Had botox for HA but not effective also. Referred to Dr. Lucia Gaskins and will do botox tomorrow.   Plan:  - continue ASA and brilinta and prevastatin for stroke prevntion - check BP and glucose at home.  - follow with Dr. Lucia Gaskins for headache treatment - recommend more active at home  - Follow up with your primary care physician for stroke risk factor modification. Recommend maintain blood pressure goal 120-140/80, diabetes with hemoglobin A1c goal below 7.0% and lipids with LDL cholesterol goal below 70 mg/dL.  - follow up with Dr. Corliss Skains as scheduled. - follow up in 4 months and will repeat MRI and MRA next visit   No orders of the defined types were placed in this encounter.  No orders of the defined types were placed  in this encounter.   Patient Instructions  - continue ASA and brilinta and prevastatin for stroke prevntion - check BP and glucose at home.  - follow with Dr. Lucia Gaskins for headache recommendations  - recommend more activity at home  - Follow up with your primary care physician for stroke risk factor modification. Recommend maintain blood pressure goal 120-140/80, diabetes with hemoglobin A1c goal below 7.0% and lipids with LDL cholesterol goal below 70 mg/dL.  - follow up with Dr. Corliss Skains as scheduled. - follow up in 4 months with me   Marvel Plan, MD PhD [facility] [address], [address] [place], [place] [address] [phone]

## 2016-12-26 ENCOUNTER — Encounter: Payer: Self-pay | Admitting: Neurology

## 2016-12-26 ENCOUNTER — Ambulatory Visit (INDEPENDENT_AMBULATORY_CARE_PROVIDER_SITE_OTHER): Payer: BLUE CROSS/BLUE SHIELD | Admitting: Neurology

## 2016-12-26 VITALS — BP 119/80 | HR 75 | Ht 65.0 in | Wt 210.0 lb

## 2016-12-26 DIAGNOSIS — G43711 Chronic migraine without aura, intractable, with status migrainosus: Secondary | ICD-10-CM

## 2016-12-26 NOTE — Progress Notes (Signed)

## 2016-12-31 NOTE — Progress Notes (Signed)
Personally  participated in, made any corrections needed, and agree with history, physical, neuro exam,assessment and plan as stated above.    Antonia Ahern, MD [facility] 

## 2017-01-22 ENCOUNTER — Encounter: Payer: Self-pay | Admitting: Neurology

## 2017-01-24 ENCOUNTER — Encounter: Payer: Self-pay | Admitting: Adult Health

## 2017-01-24 NOTE — Telephone Encounter (Signed)
Patricia [name] relayed that  we need to get her in with Dr. Lucia Gaskins.   I am not sure what else to do for her.

## 2017-02-28 ENCOUNTER — Telehealth: Payer: Self-pay | Admitting: Neurology

## 2017-02-28 NOTE — Telephone Encounter (Signed)
Botox pt needs to be rescheduled, Dr. Lucia GaskinsAhern off 6/12

## 2017-03-06 NOTE — Telephone Encounter (Signed)
I called and scheduled the patient.  °

## 2017-03-26 ENCOUNTER — Telehealth: Payer: Self-pay | Admitting: Neurology

## 2017-03-26 NOTE — Telephone Encounter (Signed)
Kim with [employer] called and requested to know if the patient had received a botox injection. I gave her the information and she agreed to approve the authorization for her next visit. She will fax over approval letter.

## 2017-03-29 ENCOUNTER — Ambulatory Visit (INDEPENDENT_AMBULATORY_CARE_PROVIDER_SITE_OTHER): Payer: BLUE CROSS/BLUE SHIELD | Admitting: Neurology

## 2017-03-29 VITALS — BP 135/90 | HR 75 | Ht 65.5 in | Wt 215.4 lb

## 2017-03-29 DIAGNOSIS — G43711 Chronic migraine without aura, intractable, with status migrainosus: Secondary | ICD-10-CM | POA: Diagnosis not present

## 2017-03-29 NOTE — Progress Notes (Signed)
Botox 100 units/vial x 2 vials from office supply (B/B) [employer]DC [id number] Lot [id number] Exp 11 2020  Diluted in 4 ml of Bacteriostatic 0.9% NaCl [employer] [id number] Lot [id number] Exp [id number]  Topical pain reliever applied to injection areas w/ gauze Lidocaine 5% cream [employer] [id number] Lot [id number] Exp 06/19

## 2017-03-29 NOTE — Progress Notes (Signed)

## 2017-04-03 ENCOUNTER — Telehealth: Payer: Self-pay

## 2017-04-03 ENCOUNTER — Ambulatory Visit: Payer: BLUE CROSS/BLUE SHIELD | Admitting: Neurology

## 2017-04-03 NOTE — Telephone Encounter (Signed)
Aimovig (erenumab) service request form and rx completed, signed and faxed to [employer]. 

## 2017-04-23 ENCOUNTER — Encounter: Payer: Self-pay | Admitting: Neurology

## 2017-04-23 NOTE — Telephone Encounter (Signed)
Pt w/ hx of cardioembolic CVA in 2009, then MRI showed L MCA stroke in 2015. She had additional s/s and MCA stent was placed in 2017 but then had angioplast for in-stent stenosis. Also has hx of htn, hld and diabetes. Last saw Dr. Roda ShuttersXu in March and has a follow-up scheduled w/ him on 05/15/17. Continues on Brilinta, aspirin and BP meds.  She c/o ongoing HA. Has tried and failed Topamax, Inderal, Keppra, Lamictal, Depakote, Cymbalta, Elavil, Imitrex, Zanaflex, Botox and nerve blocks. Horace PorteousSaw Megan NP for OV in Feb and had Botox by Dr. Lucia GaskinsAhern in June. New migraine med (Aimovig - erenumab) has been ordered for pt and she has a follow-up w/ Dr. Lucia GaskinsAhern in August.

## 2017-05-10 ENCOUNTER — Encounter: Payer: Self-pay | Admitting: Neurology

## 2017-05-15 ENCOUNTER — Ambulatory Visit: Payer: BLUE CROSS/BLUE SHIELD | Admitting: Neurology

## 2017-05-18 ENCOUNTER — Telehealth (HOSPITAL_COMMUNITY): Payer: Self-pay

## 2017-05-18 NOTE — Telephone Encounter (Signed)
Called to schedule 6 month f/u mri, left message for pt to return call. AW 

## 2017-05-22 ENCOUNTER — Other Ambulatory Visit (HOSPITAL_COMMUNITY): Payer: Self-pay | Admitting: Interventional Radiology

## 2017-05-22 DIAGNOSIS — I771 Stricture of artery: Secondary | ICD-10-CM

## 2017-05-22 DIAGNOSIS — I639 Cerebral infarction, unspecified: Secondary | ICD-10-CM

## 2017-05-31 ENCOUNTER — Telehealth: Payer: Self-pay | Admitting: Student

## 2017-05-31 ENCOUNTER — Other Ambulatory Visit: Payer: Self-pay | Admitting: Internal Medicine

## 2017-05-31 DIAGNOSIS — Z1231 Encounter for screening mammogram for malignant neoplasm of breast: Secondary | ICD-10-CM

## 2017-05-31 NOTE — Telephone Encounter (Signed)
Patient scheduled for MRI 06/07/17 at 12pm and requires oral sedation medication.  Prescription for Xanax 0.25mg  #2 given to IR schedulers for patient pick-up. Patient is to take 1 tablet 20 minutes prior to exam and may take 2nd tablet if needed.  Loyce DysKacie Shital Crayton, MS RD PA-C 12:32 PM

## 2017-06-07 ENCOUNTER — Ambulatory Visit (HOSPITAL_COMMUNITY)
Admission: RE | Admit: 2017-06-07 | Discharge: 2017-06-07 | Disposition: A | Discharge status: Home / Self Care | Payer: BLUE CROSS/BLUE SHIELD | Source: Ambulatory Visit | Attending: Interventional Radiology | Admitting: Interventional Radiology

## 2017-06-07 ENCOUNTER — Ambulatory Visit (HOSPITAL_COMMUNITY): Payer: BLUE CROSS/BLUE SHIELD

## 2017-06-07 ENCOUNTER — Encounter (HOSPITAL_COMMUNITY): Payer: Self-pay

## 2017-06-07 DIAGNOSIS — I639 Cerebral infarction, unspecified: Secondary | ICD-10-CM | POA: Diagnosis not present

## 2017-06-07 DIAGNOSIS — I6782 Cerebral ischemia: Secondary | ICD-10-CM | POA: Insufficient documentation

## 2017-06-07 DIAGNOSIS — Q283 Other malformations of cerebral vessels: Secondary | ICD-10-CM | POA: Insufficient documentation

## 2017-06-07 DIAGNOSIS — I672 Cerebral atherosclerosis: Secondary | ICD-10-CM | POA: Insufficient documentation

## 2017-06-07 DIAGNOSIS — I771 Stricture of artery: Secondary | ICD-10-CM | POA: Insufficient documentation

## 2017-06-07 LAB — CREATININE, SERUM
CREATININE: 1.28 mg/dL — AB (ref 0.44–1.00)
GFR calc Af Amer: 56 mL/min — ABNORMAL LOW (ref >60)
GFR, EST NON AFRICAN AMERICAN: 48 mL/min — AB (ref >60)

## 2017-06-07 MED ORDER — GADOBENATE DIMEGLUMINE 529 MG/ML IV SOLN
20.0000 mL | Freq: Once | INTRAVENOUS | Status: AC
  Administered 2017-06-07: 20 mL via INTRAVENOUS

## 2017-06-19 ENCOUNTER — Ambulatory Visit (INDEPENDENT_AMBULATORY_CARE_PROVIDER_SITE_OTHER): Payer: BLUE CROSS/BLUE SHIELD | Admitting: Neurology

## 2017-06-19 ENCOUNTER — Encounter: Payer: Self-pay | Admitting: Neurology

## 2017-06-19 VITALS — BP 122/82 | HR 72 | Ht 65.5 in | Wt 211.0 lb

## 2017-06-19 DIAGNOSIS — G43711 Chronic migraine without aura, intractable, with status migrainosus: Secondary | ICD-10-CM | POA: Diagnosis not present

## 2017-06-19 DIAGNOSIS — R51 Headache: Secondary | ICD-10-CM

## 2017-06-19 DIAGNOSIS — R519 Headache, unspecified: Secondary | ICD-10-CM

## 2017-06-19 MED ORDER — ZONISAMIDE 50 MG PO CAPS: 100.0000 mg | ORAL_CAPSULE | Freq: Every day | ORAL | 11 refills | Status: DC

## 2017-06-19 NOTE — Progress Notes (Signed)
[facility]    Provider:  Dr Lucia Gaskins Referring Provider: Ralene Ok, MD Primary Care Physician:  Ralene Ok, MD  CC:  Migraines  Interval history 8/28:  Patient returns for chronic daily headache. Patient states she has continuous headaches, she wakes up with headaches and she was spent with headaches. She has had multiple sleep studies. She is failed most first line and second line and other medications. She has had Botox now twice and I encouraged her to have the third treatment before stopping. She is also taking Aimovig. We'll start her on zonisamide but at this point patient would like to be referred to [facility] neurology headache clinic will place the referral.   Medications tried: Inderal, gabapentin, depakote, amitriptyline, topamax, imitrex, botox. Effexor, lexapro, nebivolol, Aimovig, tizanidine, zoloft, and other multiple medications over 30 year history of intractable migraines  HPI:  Patricia [name] is a 51 y.o. female here as a referral from Dr. Ludwig Clarks for headache. PMH of obesity, hypertension, diabetes, hyperlipidemia, previous embolic stroke in 2009 presumably due to PFO/ASD state post of closure by Dr. Jacinto Halim was admitted on 05/22/14 for right arm and leg numbness weakness, difficulty with speech. MRI had showed left MCA territory small multiple infarcts. MRA head showed significant intracranial stenosis. She was discharged on aspirin and Plavix dural antiplatelet therapy and outpatient PT/OT. She has headache for the last 30 years. the pain are at the back of her head at skull base line, with pain at the back of right eye. 2-3 per month, usually lasting for 2 days. 10/10, phonophobia but no photophobia, nausea but no vomiting. She has daily HA, has tried inderal, depakote, amitriptyline, topamax, imitrex, botox but none of them worked either due to side effects or ineffectiveness. She also had LP for CSF drainage due to ? Increased intracranial pressure,  but still not effective. She denies tension type HA.   Headaches started 30 years ago. Her aunt, son have migraines. Headaches very, sometimes she feel like someone is hitting her on the head, behind the eyes, and the back of the right side. Like someone stabbing her behind the eye or like an axe on the top of her head.  She denies denies any lacrimation, injection or other autonomic associated symptoms. When the stabbing behind the eye comes it can last a few hours. The stabbing headache is diffferent than the pther headaches. The pain in the back of the head is pounding and she feels an axe on the top of her head sometimes all of these are together but sometimes the eye stabbing pain is separate. She has had a current headache since yesterday. Today she has pounding in the right back of the head. She has most days so 25/30 day of the month.  She has light sensitivity, smell sensitivy, perfumes are triggers. She also has nausea and vomiting. Headaches can last days. Most headaches are migrainous at least 20/20 days in a month. No aura. No medication overuse.  She was seeing [facility] for headaches, Dr. Cabarrus Lions for headaches for a long time (will request records). She was diagnosed with migraines. Sh ehad a sleep study and a lumbar puncture. She saw him many years ago since 2009. She tired Topamax and worsened memory loss. The depakote did not work. Amitriptyline recently and did not work, it was not controlling the headaches. Lamictal is not helping either.   Medications tried: Inderal, depakote, amitriptyline, topamax, imitrex, botox. Effexor, lexapro  Review of Systems: Patient complains of symptoms per HPI  as well as the following symptoms: no CP, no SOB. Pertinent negatives per HPI. All others negative.  Social History   Social History  . Marital status: Single    Spouse name: [name]  . Number of children: 4  . Years of education: College    Occupational History  . 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 Smoking status: Never Smoker  . Smokeless tobacco: Never Used  . Alcohol use 0.0 oz/week     Comment: occasional  . Drug use: No  . Sexual activity: No   Other Topics Concern  . Not on file   Social History Narrative   Patient is single with 4 children.   Patient is right handed.   Patient has college education.   Patient drinks 1 cup daily.    Family History  Problem Relation Age of Onset  . Hypertension Mother   . Hypertension Father   . Cancer Maternal Aunt   . Cancer Maternal Uncle     Past Medical History:  Diagnosis Date  . Anemia    in the past  . Asthma    " a long time ago"  . Bursitis   . Depression    13 years ago -   . Diabetes mellitus without complication (HCC)    type  2  . GERD (gastroesophageal reflux disease)    occ. acid reflux  . Headache    migraines  . Hypertension   . Kidney stones   . PFO (patent foramen ovale)    hx of PFO with surgery done as an adult  . Rotator cuff tear    right  . Stroke [facility]) 2009   lasdt stroke July 2015  . Syncope and collapse     Past Surgical History:  Procedure Laterality Date  . ABDOMINAL HYSTERECTOMY    . BREAST REDUCTION SURGERY    . CARDIAC CATHETERIZATION    . CARDIAC SURGERY  Closed hole in heart in 2009   Amplatzer Septal Occluder Ref [id number] (not listed in op notes, document scanned under MRI with written op notes  . HYSTEROTOMY    . IR GENERIC HISTORICAL  06/23/2016   IR ANGIO INTRA EXTRACRAN SEL COM CAROTID INNOMINATE BILAT MOD SED 06/23/2016 Julieanne Cotton, MD [facility]  . IR GENERIC HISTORICAL  06/23/2016   IR ANGIO VERTEBRAL SEL VERTEBRAL BILAT MOD SED 06/23/2016 Julieanne Cotton, MD [facility]  . IR GENERIC HISTORICAL  06/29/2016   IR RADIOLOGIST EVAL & MGMT 06/29/2016 [facility]  . IR GENERIC HISTORICAL  07/24/2016   IR INTRA CRAN STENT 07/24/2016 Julieanne Cotton, MD [facility]  . IR GENERIC HISTORICAL  07/28/2016   IR PTA INTRACRANIAL  07/28/2016 Julieanne Cotton, MD [facility]  . IR GENERIC HISTORICAL  07/28/2016   IR ENDOVASC INTRACRANIAL INF OTHER THAN THROMBO ART INC DIAG ANGIO 07/28/2016 Julieanne Cotton, MD [facility]  . IR GENERIC HISTORICAL  08/07/2016   IR RADIOLOGIST EVAL & MGMT 08/07/2016 [facility]  . IR GENERIC HISTORICAL  12/05/2016   IR ANGIO INTRA EXTRACRAN SEL COM CAROTID INNOMINATE BILAT MOD SED 12/05/2016 Julieanne Cotton, MD [facility]  . IR GENERIC HISTORICAL  12/05/2016   IR ANGIO VERTEBRAL SEL SUBCLAVIAN INNOMINATE UNI R MOD SED 12/05/2016 Julieanne Cotton, MD [facility]  . IR GENERIC HISTORICAL  12/05/2016   IR ANGIO VERTEBRAL SEL VERTEBRAL UNI L MOD SED 12/05/2016 Julieanne Cotton, MD [facility]  . RADIOLOGY WITH ANESTHESIA [name] 07/24/2016   Procedure:  Stenting;  Surgeon: Julieanne Cotton, MD;  Location: [facility] OR;  Service: Radiology;  Laterality: [name];  . RADIOLOGY WITH ANESTHESIA [name] 07/28/2016   Procedure: RADIOLOGY WITH ANESTHESIA;  Surgeon: Medication Radiologist, MD;  Location: [facility] OR;  Service: Radiology;  Laterality: [name];  . SHOULDER ARTHROSCOPY WITH OPEN ROTATOR CUFF REPAIR AND DISTAL CLAVICLE ACROMINECTOMY Right 02/04/2015   Procedure: SHOULDER ARTHROSCOPY WITH SUBACROMIAL DECOMPRESSION AND OPEN ROTATOR CUFF REPAIR,;  Surgeon: Cammy Copa, MD;  Location: [facility] OR;  Service: Orthopedics;  Laterality: Right;  RIGHT SHOULDER DOA,SAD,MINI-OPEN ROTATOR CUFF TEAR REPAIR.    Current Outpatient Prescriptions  Medication Sig Dispense Refill  . aspirin EC 81 MG tablet Take 81 mg by mouth at bedtime.    . chlorthalidone (HYGROTON) 25 MG tablet Take 25 mg by mouth at bedtime.   5  . Empagliflozin-Metformin HCl (SYNJARDY) 12.02-999 MG TABS Take 1 tablet by mouth 2 (two) times daily.     Dorise Hiss (AIMOVIG) 70 MG/ML SOAJ Inject into the skin.    . Nebivolol HCl (BYSTOLIC) 20 MG TABS Take 10 mg by mouth every evening.     Marland Kitchen oxyCODONE-acetaminophen (PERCOCET) 10-325 MG tablet Take 1  tablet by mouth every 4 (four) hours as needed for pain.    . pravastatin (PRAVACHOL) 40 MG tablet Take 40 mg by mouth at bedtime.    . promethazine (PHENERGAN) 25 MG tablet Take 25 mg by mouth 3 (three) times daily as needed for nausea or vomiting.     [name] 100-33 UNT-MCG/ML SOPN INJECT 32 UNITS UNDER THE SKIN DAILY in the evening AS NEEDED FOR BLOOD SUGAR  6  . ticagrelor (BRILINTA) 90 MG TABS tablet Take 1 tablet (90 mg total) by mouth 2 (two) times daily. (Patient taking differently: Take 45-90 mg by mouth 2 (two) times daily. Take 1/2 tablet (45mg ) in the morning, and 1 tablet (90mg ) at night) 60 tablet 2  . tiZANidine (ZANAFLEX) 4 MG tablet Take 4 mg by mouth every 8 (eight) hours as needed (migraines).     . botulinum toxin Type A (BOTOX) 100 units SOLR injection Inject  IM by provider in office every 3 months (Patient not taking: Reported on 06/19/2017) 2 vial 3   No current facility-administered medications for this visit.     Allergies as of 06/19/2017 - Review Complete 06/19/2017  Allergen Reaction Noted  . Imitrex [sumatriptan] Anaphylaxis 05/22/2014  . Triptans Anaphylaxis and Hives 09/13/2016  . Plavix [clopidogrel bisulfate] Hives 08/27/2014  . Cymbalta [duloxetine hcl] Nausea Only 11/14/2016  . Topamax [topiramate]  08/23/2016  . Lipitor [atorvastatin] Rash 01/19/2015    Vitals: BP 122/82 (BP Location: Left Arm, Patient Position: Sitting, Cuff Size: Large)   Pulse 72   Ht 5' 5.5" (1.664 m)   Wt 211 lb (95.7 kg)   BMI 34.58 kg/m  Last Weight:  Wt Readings from Last 1 Encounters:  06/19/17 211 lb (95.7 kg)   Last Height:   Ht Readings from Last 1 Encounters:  06/19/17 5' 5.5" (1.664 m)       Physical exam: Exam: Gen: NAD, conversant, well nourised, obese, well groomed                     CV: RRR, no MRG. No Carotid Bruits. No peripheral edema, warm, nontender Eyes: Conjunctivae clear without exudates or hemorrhage  Neuro: Detailed Neurologic  Exam  Speech:    Speech is normal; fluent and spontaneous with normal comprehension.  Cognition:    The patient is  oriented to person, place, and time;     recent and remote memory intact;     language fluent;     normal attention, concentration,     fund of knowledge Cranial Nerves:    The pupils are equal, round, and reactive to light. The fundi are normal and spontaneous venous pulsations are present. Visual fields are full to finger confrontation. Extraocular movements are intact. Trigeminal sensation is intact and the muscles of mastication are normal. The face is symmetric. The palate elevates in the midline. Hearing intact. Voice is normal. Shoulder shrug is normal. The tongue has normal motion without fasciculations.   Coordination:    Normal finger to nose and heel to shin. Normal rapid alternating movements.   Gait:    Heel-toe and tandem gait are normal.   Motor Observation:    No asymmetry, no atrophy, and no involuntary movements noted. Tone:    Normal muscle tone.    Posture:    Posture is normal. normal erect    Strength:    Strength is V/V in the upper and lower limbs.      Sensation: intact to LT     Reflex Exam:  DTR's:    Deep tendon reflexes in the upper and lower extremities are normal bilaterally.   Toes:    The toes are downgoing bilaterally.   Clonus:    Clonus is absent.   A/P: 51 year old with intractable migraines and chronic daily headache.  Patient returns for chronic daily headache. Patient states she has continuous headaches, she wakes up with headaches and she was spent with headaches. She has had multiple sleep studies. She is failed most first line and second line and other medications. She has had Botox now twice and I encouraged her to have the third treatment before stopping. She is also taking Aimovig. We'll start her on zonisamide but at this point patient would like to be referred to [facility] neurology headache clinic will place the  referral.  Medications tried: Inderal, gabapentin, depakote, amitriptyline, topamax, imitrex, botox x 2 (encouraged her to continue with third injection). Effexor, lexapro, nebivolol, Aimovig, tizanidine, zoloft, zonisamide and other multiple medications over 30 year history of intractable migraines  Naomie Dean, MD  [facility] [address] [place], [place] [address]  Phone [phone] Fax [phone]  A total of 25 minutes was spent face-to-face with this patient. Over half this time was spent on counseling patient on the intractable migraine diagnosis and different diagnostic and therapeutic options available.

## 2017-06-19 NOTE — Patient Instructions (Addendum)
Remember to drink plenty of fluid, eat healthy meals and do not skip any meals. Try to eat protein with a every meal and eat a healthy snack such as fruit or nuts in between meals. Try to keep a regular sleep-wake schedule and try to exercise daily, particularly in the form of walking, 20-30 minutes a day, if you can.   As far as your medications are concerned, I would like to suggest: Start with Zonisamide 50mg  (1 pill) at bedtime then in 2 weeks increase to 100mg  at bedtime (2 pills)  I would like to see you back for botox, sooner if we need to. Please call us with any interim questions, concerns, problems, updates or refill requests.   Our phone number is [phone]. We also have an after hours call service for urgent matters and there is a physician on-call for urgent questions. For any emergencies you know to call 911 or go to the nearest emergency room  Zonisamide capsules What is this medicine? ZONISAMIDE (zoe NIS a mide) is used to control partial seizures in adults with epilepsy. This medicine may be used for other purposes; ask your health care provider or pharmacist if you have questions. COMMON BRAND NAME(S): Zonegran What should I tell my health care provider before I take this medicine? They need to know if you have any of these conditions: -dehydrated -diarrhea -history of metabolic acidosis (too much acid in your blood) -ketogenic diet -kidney disease -liver disease -lung disease -osteoporosis -suicidal thoughts, plans, or attempt; a previous suicide attempt by you or a family member -an unusual or allergic reaction to zonisamide, sulfa drugs, other medicines, foods, dyes, or preservatives -pregnant or trying to get pregnant -breast-feeding How should I use this medicine? Take this medicine by mouth with a glass of water. Follow the directions on the prescription label. Swallow whole. Do not break open the capsule. This medicine may be taken with or without food. Take  your doses at regular intervals. Do not take your medicine more often than directed. Do not stop taking this medicine unless instructed by your doctor or health care professional. A special MedGuide will be given to you by the pharmacist with each prescription and refill. Be sure to read this information carefully each time. Talk to your pediatrician regarding the use of this medicine in children. While this drug may be prescribed for children as young as 74 years of age for selected conditions, precautions do apply. Overdosage: If you think you have taken too much of this medicine contact a poison control center or emergency room at once. NOTE: This medicine is only for you. Do not share this medicine with others. What if I miss a dose? If you miss a dose, take it as soon as you can. If it is almost time for your next dose, take only that dose. Do not take double or extra doses. What may interact with this medicine? This medicine may interact with the following medications -alcohol -antihistamines for allergy, cough and cold -antiviral medicines for HIV or AIDS -certain antibiotics like rifabutin, rifampin -certain medicines for anxiety or sleep -certain medicines for depression like amitriptyline, fluoxetine, sertraline -certain medicines for seizures like carbamazepine, phenobarbital, phenytoin, topiramate -digoxin -diuretics like acetazolamide, dichlorphenamide -general anesthetics like halothane, isoflurane, methoxyflurane, propofol -local anesthetics like lidocaine, pramoxine, tetracaine -medicines that relax muscles for surgery -narcotic medicines for pain -phenothiazines like chlorpromazine, mesoridazine, prochlorperazine, thioridazine -quinidine This list may not describe all possible interactions. Give your health care provider a list of  all the medicines, herbs, non-prescription drugs, or dietary supplements you use. Also tell them if you smoke, drink alcohol, or use illegal drugs.  Some items may interact with your medicine. What should I watch for while using this medicine? Visit your doctor or health care professional for regular checks on your progress. Wear a medical identification bracelet or chain to say you have epilepsy, and carry a card that lists all your medications. It is important to take this medicine exactly as directed. When first starting treatment, your dose will need to be adjusted slowly. It may take weeks or months before your dose is stable. You should contact your doctor or health care professional if your seizures get worse or if you have any new types of seizures. Do not stop taking except on your doctor's advice. You may develop a severe reaction. Your doctor will tell you how much medicine to take. You may get drowsy, dizzy, or have blurred vision. Do not drive, use machinery, or do anything that needs mental alertness until you know how this medicine affects you. To reduce dizzy or fainting spells, do not sit or stand up quickly, especially if you are an older patient. Alcohol can increase drowsiness and dizziness. Avoid alcoholic drinks. Avoid extreme heat. This medicine can cause you to sweat less than normal. Your body temperature could increase to dangerous levels, which may lead to heat stroke. This medicine may increase the chance of developing metabolic acidosis. If left untreated, this can cause kidney stones, bone disease, or slowed growth in children. Symptoms include breathing fast, fatigue, loss of appetite, irregular heartbeat, or loss of consciousness. Call your doctor immediately if you experience any of these side effects. Also, tell your doctor about any surgery you plan on having while taking this medicine since this may increase your risk for metabolic acidosis. This medicines may increase the risk of kidney stones. Drinking 6 to 8 glasses of water a day may help prevent the formation of kidney stones. The use of this medicine may increase  the chance of suicidal thoughts or actions. Pay special attention to how you are responding while on this medicine. Any worsening of mood, or thoughts of suicide or dying should be reported to your health care professional right away. Women who become pregnant while using this medicine may enroll in the [place] American Antiepileptic Drug Pregnancy Registry by calling [phone]. This registry collects information about the safety of antiepileptic drug use during pregnancy. What side effects may I notice from receiving this medicine? Side effects that you should report to your doctor or health care professional as soon as possible: -allergic reactions like skin rash, itching or hives, swelling of the face, lips, or tongue -decreased sweating or a rise in body temperature, especially in patients under 48 years old -difficulty breathing or tightening of the throat -feeling faint or lightheaded, falls -fever, sore throat, sores in your mouth, or bruising easily -hallucination, loss of contact with reality -irregular heartbeat -loss of appetite -redness, blistering, peeling or loosening of the skin, including inside the mouth -severe drowsiness, difficulty concentrating, or coordination problems -speech or language problems -sudden back pain, abdominal pain, pain when urinating, bloody or dark urine -suicidal thoughts or depression -unusual changes in behavior or mood -unusually weak or tired -vomiting Side effects that usually do not require medical attention (report to your doctor or health care professional if they continue or are bothersome): -headache -nausea This list may not describe all possible side effects. Call your doctor for  medical advice about side effects. You may report side effects to FDA at 1-800-FDA-1088. Where should I keep my medicine? Keep out of reach of children. Store at room temperature between 15 and 30 degrees C (59 and 86 degrees F). Keep in a dry place protected  from light. Throw away any unused medicine after the expiration date. NOTE: This sheet is a summary. It may not cover all possible information. If you have questions about this medicine, talk to your doctor, pharmacist, or health care provider.  2018 Elsevier/Gold Standard (2015-11-11 09:50:49)

## 2017-06-26 ENCOUNTER — Ambulatory Visit (INDEPENDENT_AMBULATORY_CARE_PROVIDER_SITE_OTHER): Payer: BLUE CROSS/BLUE SHIELD | Admitting: Neurology

## 2017-06-26 ENCOUNTER — Encounter: Payer: Self-pay | Admitting: Neurology

## 2017-06-26 VITALS — BP 145/97 | HR 71 | Wt 210.4 lb

## 2017-06-26 DIAGNOSIS — Z794 Long term (current) use of insulin: Secondary | ICD-10-CM

## 2017-06-26 DIAGNOSIS — I10 Essential (primary) hypertension: Secondary | ICD-10-CM

## 2017-06-26 DIAGNOSIS — I63512 Cerebral infarction due to unspecified occlusion or stenosis of left middle cerebral artery: Secondary | ICD-10-CM

## 2017-06-26 DIAGNOSIS — E785 Hyperlipidemia, unspecified: Secondary | ICD-10-CM

## 2017-06-26 DIAGNOSIS — E118 Type 2 diabetes mellitus with unspecified complications: Secondary | ICD-10-CM

## 2017-06-26 DIAGNOSIS — Z9889 Other specified postprocedural states: Secondary | ICD-10-CM | POA: Diagnosis not present

## 2017-06-26 DIAGNOSIS — Z8774 Personal history of (corrected) congenital malformations of heart and circulatory system: Secondary | ICD-10-CM | POA: Diagnosis not present

## 2017-06-26 DIAGNOSIS — G43711 Chronic migraine without aura, intractable, with status migrainosus: Secondary | ICD-10-CM

## 2017-06-26 NOTE — Patient Instructions (Signed)
-   continue ASA and brilinta and prevastatin for stroke prevntion - check BP and glucose at home.  - follow with [facility] neurology for headache treatment - recommend more active at home, avoid depression, anxiety - Follow up with your primary care physician for stroke risk factor modification. Recommend maintain blood pressure goal 120-140/80, diabetes with hemoglobin A1c goal below 7.0% and lipids with LDL cholesterol goal below 70 mg/dL.  - follow up with Dr. Corliss Skainseveshwar as scheduled. - follow up in 6 months

## 2017-06-26 NOTE — Progress Notes (Signed)
STROKE NEUROLOGY FOLLOW UP NOTE  NAME: Patricia [name] DOB: Mar 14, 1966  REASON FOR VISIT: stroke follow up HISTORY FROM: pt and chart  Today we had the pleasure of seeing Patricia [name] in follow-up at our Neurology Clinic. Pt was accompanied by daughter and granddaughter.   History Summary 51 y.o. African American female with PMH of obesity, hypertension, diabetes, hyperlipidemia, previous embolic stroke in 2009 presumably due to PFO/ASD state post of closure by Dr. Jacinto Halim was admitted on 05/22/14 for right arm and leg numbness weakness, difficulty with speech. MRI had showed left MCA territory small multiple infarcts. MRA head showed significant intracranial stenosis. She was discharged on aspirin and Plavix dural antiplatelet therapy and outpatient PT/OT.  She had a previous CVA in 2009, MRI showed multiple bilateral small infarcts consistent with cardioembolic stroke. She was found by PFO/ASD, state post of a PFO closure with Dr. Jacinto Halim. She stated she was treated with aspirin and Plavix for some time and then Dr. Jacinto Halim took her off Plavix. She had no residual deficit left from previous stroke.  She had multiple stroke risk factors including hypertension, hyperlipidemia, diabetes, obesity. She stated that she was not a compliant with medication in the past and he now watch for her diet.  She denies any cigarette smoking, alcohol drinking, or illicit drugs.  06/17/14 follow up -  the patient has been doing well. She still not able to write with right hand, and she is getting PT/OT 2/wk. Her BP at home was OK, today in clinic 146/93. Her BP has been low lately so Dr. Jacinto Halim decreased her bystolic. She saw her PCP early this month and changed her from plavix to crestor due to questionable rashes. Continue her on PO meds for DM and Sugar this morning 117.   08/27/14 follow up - she was doing well. Her BP today in clinic was 115/80 and she stated that her sugar level at home was 113,  190, 130 for the last 3 days. Her CTA head and neck was stable comparing with prior, still showing left MCA M1 high grade stenosis as well as bilateral ACA stenosis with diffuse intracranial atherosclerosis. Vasculitis work up negative. TCD bubble study consistent with PFO s/p closure. She has allergic reaction to plavix, so PCP put her on ASA 81mg  bid and cilostazol for dural antiplatelet.  However, she complained in clinic today about her headache. She had headache for the last 30 years, the pain are at the back of her head at skull base line, with pain at the back of right eye. 2-3 per month, usually lasting for 2 days. 10/10, phonophobia but no photophobia, nausea but no vomiting. No neck pain. However, for the last 3 weeks, she has similar headache not go away. She takes percocet and phernagen but not effective.  09/07/15 follow up - pt has been doing the same. She had one episode of right arm and leg weakness, word finding difficulty after a long day (work that day, rushed to pick up her grandson for football game, restaurant to eat and drove back). She was admitted to OSH, CT head and MRI showed no acute abnormalities. Normal TTE. She was discharged with resolution of symptoms, likely due to recrudescence of previous stroke.   She still complains of headache with neck pain, consistent with occipital neuralgia. She had occipital nerve block last visit, but she said that was not very effective. She was also on amitriptyline for the headache but she stopped it as it was  also not effective. Currently she was put on fioricet and percocet for the headache with aware of rebound headache. I recommended to her pain management referral to consider radioablation of occipital nerve.   03/23/16 follow up - pt has been doing fine from stroke standpoint. She recently complains that she has the episodes of difficulty speaking, getting words out, on and off, increasingly frequent. She has similar episodes 07/2015 and  MRI negative for stroke. She again had another MRI 12/2015 and negative for stroke. Her symptoms are likely recrudescence from her previous strokes in the setting of fatigue, stress, anxiety, depression, infection and etc. However, she stated that the episodes happened randomly, not associated with above triggers. She also denies association with HA or stress. However, she stated that the episodes seem to happen when her BP was low, as low as 100/60. Currently in clinic, she has no deficit and still in depressed mood.  She also complains of HA which has been going on for long time. She has daily HA, has tried inderal, depakote, amitriptyline, topamax, imitrex, botox but none of them worked either due to side effects or ineffectiveness. She also had LP for CSF drainage due to ? Increased intracranial pressure, but still not effective. She denies tension type HA.   08/23/16 follow up - pt had EEG for speech difficulty episode work up which was negative. However, pt started to feel right hand weakness and intermittent word finding difficulties in 05/2016. Repeat MRI showed punctate left MCA stroke, same distribution as her stroke 2 years ago. She does have high grade stenosis of left M1, but denies any low BP or dehydration. Due to recurrent stroke at same territory of high grade stenosis of left M1, she was referred to Dr. Corliss Skains for consideration of intracranial stent. She had left MCA stent on 07/24/16, and put on ASA and brilinta. However, she was readmitted on 07/28/16 for left sided weakness and dysarthria. CTA head and neck showed nonocclusive thrombus in the left MCA stent. MRI showed patchy left MCA cortical infarcts. Pt underwent angioplasty for in-stent stenosis. LDL 52 and A1C 7.4. She was continued on ASA and brilinta and pravastatin on discharge.  Currently still has right facial droop and mild right hand dexterity difficulty, not able to write well. Low energy level and easily tired. Finished home  PT/OT and on home exercise now.   She still complains of HA since last week. In the past has tried topamax but stopped due to cognitive issue. Has tried inderal long time ago, not effective and currently she is on multiple BP meds including beta bloker. She has tried keppra, depakote, neurontin and amitriptyline all not effective. Like to avoid imitrex due to stroke. Has tried occipital nerve block but not effective. On lamictal 100mg  bid now. Had botox for HA but not effective also.   12/25/16 follow up - pt has been doing well. She had one episode of N/V and HA on 11/14/16 and she went to ED, had CT head which showed no acute abnormalities. Her symptoms resolved and she was discharged home. She followed with Dr. Corliss Skains on 12/05/16 and repeat angiogram showed approximately 70-75% stenosis within the mid portion of the stented segment of the left MCA most likely secondary to a combination of near intimal hyperplasia and atherosclerotic plaque. Recommended MRA and MRI in 6 months. She saw Dr. Lucia Gaskins on 09/13/16 for HA management and will get botox tomorrow. BP at home less than 130, and 130/68 today, glucose at home 125-136.  She is back to work, right hand function continues to improve and language now normal.  Interval History During the interval time, patient had been doing largely the same. Still complained of chronic daily headache, varies from day to day, more on the right side of the head. This can be a achy pain or pounding or even sharp pain in the head, last at least half a day, nauseous but responding to phenergan. Has been following with Dr. Lucia Gaskins, tried Botox 2, aimovig x 2, but not effective, she was referred to [facility] neurology headache clinic. Stated that blood pressure and glucose under good control. Had repeat MRA and MRI head, no acute stroke, left MCA stent patent. Patient has not seen Dr. Corliss Skains for yet. BP today 145/97.  REVIEW OF SYSTEMS: Full 14 system review of systems performed and  notable only for those listed below and in HPI above, all others are negative:   Restless leg, HA, speech difficulty, weakness, facial drooping  The following represents the patient's updated allergies and side effects list: Allergies  Allergen Reactions  . Imitrex [Sumatriptan] Anaphylaxis  . Triptans Anaphylaxis and Hives  . Plavix [Clopidogrel Bisulfate] Hives  . Cymbalta [Duloxetine Hcl] Nausea Only  . Topamax [Topiramate]     Memory issues  . Lipitor [Atorvastatin] Rash   The neurologically relevant items on the patient's problem list were reviewed on today's visit.  Neurologic Examination  A problem focused neurological exam (12 or more points of the single system neurologic examination, vital signs counts as 1 point, cranial nerves count for 8 points) was performed.  Blood pressure (!) 145/97, pulse 71, weight 210 lb 6.4 oz (95.4 kg).  General - obese, well developed, depressed mood.  Ophthalmologic - Sharp disc margins OU.  Cardiovascular - Regular rate and rhythm with no murmur.  Mental Status -  Level of arousal and orientation to time, place, and person were intact. Language including expression, naming, repetition, comprehension, reading, and writing was assessed and found intact.  Cranial Nerves II - XII - II - Visual field intact OU. III, IV, VI - Extraocular movements intact. V - Facial sensation intact bilaterally. VII - mild right nasolabial fold flattening. VIII - Hearing & vestibular intact bilaterally. X - Palate elevates symmetrically. XI - Chin turning & shoulder shrug intact bilaterally. XII - Tongue protrusion intact.  Motor Strength - The patient's strength was normal in all extremities except subtle decreased right hand dexterity.  Bulk was normal and fasciculations were absent.   Motor Tone - Muscle tone was assessed at the neck and appendages and was normal.  Reflexes - The patient's reflexes were normal extremities and she had no pathological  reflexes.  Sensory - Light touch, temperature/pinprick and Romberg testing were assessed and were normal.    Coordination - The patient had normal movements in the hands and feet with no ataxia or dysmetria.  Tremor was absent.  Gait and Station - The patient's transfers, posture, gait, station, and turns were observed as normal.  Data reviewed: I personally reviewed the images and agree with the radiology interpretations.  Mr Shirlee Latch Wo Contrast  05/22/2014  Multiple acute infarctions in the left hemisphere that could be a combination of watershed infarctions and micro embolic infarctions.   MRA  05/22/2014  Advanced intracranial disease with very limited flow and the anterior cerebral artery territories and multiple middle cerebral stenoses, particularly severe and notable at the M1 segment on the left with the vessel shows 90% stenosis. Distal branch vessels  appear reduced in number an show irregularity. The findings could be due to widespread advanced atherosclerotic disease or vasculitis. This represents a marked worsening of disease since 2009. Old infarctions affecting the thalami, hemispheric white matter, genu of the corpus callosum and right parietal cortex at the vertex.   Dg Chest Port 1 View  05/22/2014  There is no active cardiopulmonary disease.   Carotid Doppler Preliminary report: 1-39% ICA stenosis. Vertebral artery flow is antegrade.  2D Echocardiogram Left ventricle: The cavity size was at the upper limits ofnormal. Systolic function was normal.  The estimated ejection fraction was in the range of 55% to 60%. Wall motion was normal; there were no regional wall motion abnormalities. - Atrial septum: There is no color Doppler e/o residual inter atrial shunting. Flow present within the right atrial disk without crossing the septum (normal).  An Amplatzer closure device in the fossa ovalis region was present.  There was no atrial level shunt. No evidence of  thrombus.  CTA head and neck 07/03/14 1. Severe proximal left MCA stenosis. Mild proximal right MCA stenosis. Bilateral MCA branch vessel irregularity and attenuation. Occlusion of the anterior cerebral arteries at their origins with some reconstituted but severely diminished flow distally. Mild left P2 stenosis. Considerations again include advanced atherosclerosis and vasculitis. 2. No evidence of cervical carotid or vertebral artery stenosis.  TCD bubble study 07/06/14 Both middle cerebral arteries could not be insonated using a headset due to poor bitemporal windows and procedure was done insonating left vertebral artery with a hand held probe. And IV line was inserted in the left elbow by the RN using aseptic precautions. Agitated saline injection at rest and after valsalva maneuver did result in several high intensity transient signals (HITS).  Impression: Positive Transcranial Doppler Bubble Study indicative of small residual right to left intracardiac shunt despite endovascular PFO closure.   MRI and MRA head 02/2015 -  MRI HEAD: No acute intracranial process, specifically no acute Infarct. Multifocal small areas of encephalomalacia corresponding to prior LEFT cerebral remote infarcts. Remote bilateral basal ganglia and thalamus lacunar infarcts. Moderate white matter changes suggest chronic small vessel ischemic disease.  MRA HEAD: Focal high-grade stenosis of LEFT M1 segment, stable. Chronically occluded RIGHT anterior cerebral artery, with thready irregular LEFT anterior cerebral artery, unchanged. Thready, somewhat narrowed basilar artery, worse than prior examination though this may be accentuated by motion artifact. Moderate stenosis LEFT P2 segment, worse than prior imaging. Improved appearance of the LEFT middle cerebral artery from prior imaging, suggesting prior vasculitis.  MRI brain 01/05/16 No acute or subacute infarction. Old infarctions affecting the thalami, basal  ganglia, genu of the corpus callosum, hemispheric white matter and left and right cortical and subcortical infarctions at the vertex.  EEG 05/23/16 -  Normal EEG in the awake and drowsy state.  MR Brain Wo Contrast  07/29/2016 1. Patchy multi focal acute ischemic predominantly cortical left MCA territory infarcts as above. Associated petechial hemorrhage without frank hemorrhagic transformation. No significant mass effect. 2. Mild chronic microvascular ischemic disease with additional scattered remote lacunar infarcts as above.  Ct Angio Head and Neck W Or Wo Contrast 07/28/2016 1.Possible nonocclusive thrombus in the left MCA stent.Abnormally prolonged transit throughout the left MCA superior division without evidence of acute core infarct or large vessel occlusion.  2. Chronically occluded or severely diseased A1 segments.  3. Mild right M1 MCA stenosis.   Ct Head Wo Contrast 07/28/2016 1. No acute intracranial abnormalities.  2. Small left frontal lobe subcortical white  matter infarct. Minor chronic microvascular ischemic change.  3. Left middle cerebral artery stent new since the prior study. No other change.   Ct Cerebral Perfusion W Contrast 07/28/2016 1. Possible nonocclusive thrombus in the left MCA stent. Abnormally prolonged transit throughout the left MCA superior division without evidence of acute core infarct or large vessel occlusion.  2. Chronically occluded or severely diseased A1 segments.  3. Mild right M1 MCA stenosis.   12/05/16 Approximately 70-75% stenosis within the mid portion of the stented segment of the left middle cerebral artery most likely secondary to a combination of near intimal hyperplasia and atherosclerotic plaque. Hypoplastic right anterior cerebral artery A1 segment. Nonvisualization of the left anterior cerebral artery A1 segment. Dominant posterior communicating arteries bilaterally.  Mri and Mra Head Wo Contrast 06/07/2017 IMPRESSION: MRI  head: 1. No acute intracranial abnormality or abnormal enhancement. 2. Small chronic infarctions are present in left frontal centrum semiovale and bilateral caudate heads. 3. Mild chronic microvascular ischemic changes of white matter. MRA head: 1. Long segment flow void of left M1 corresponding to intra-arterial stent. Assessment of luminal stenosis is not possible due to susceptibility artifact. Bilateral M2 and distal MCA circulation is symmetric. 2. Hypoplastic right A1, nonvisualized left A1, and poor downstream flow related signal in the bilateral anterior cerebral arteries, similar to prior cerebral angiogram given differences in technique. 3. Intracranial atherosclerosis with areas of stable mild-to-moderate stenosis in the anterior and posterior circulation.     Component     Latest Ref Rng 05/22/2014 05/23/2014 06/17/2014  PTT Lupus Anticoagulant     28.0 - 43.0 secs 32.0    PTTLA Confirmation     <8.0 secs NOT APPL    PTTLA 4:1 Mix     28.0 - 43.0 secs NOT APPL    DRVVT     <42.9 secs 36.1    Drvvt confirmation     <1.15 Ratio NOT APPL    dRVVT Incubated 1:1 Mix     <42.9 secs NOT APPL    Lupus Anticoagulant     NOT DETECTED NOT DETECTED    ENA RNP Ab     0.0 - 0.9 AI   <0.2  ENA SM Ab Ser-aCnc     0.0 - 0.9 AI   <0.2  Rhuematoid fact SerPl-aCnc     0.0 - 13.9 IU/mL   8.3  Chromatin Ab SerPl-aCnc     0.0 - 0.9 AI   <0.2  ENA SSA (RO) Ab     0.0 - 0.9 AI   0.8  ENA SSB (LA) Ab     0.0 - 0.9 AI   0.2  dsDNA Ab     0 - 9 IU/mL   <1  Cholesterol     0 - 200 mg/dL  409   Triglycerides     <150 mg/dL  811   HDL     >91 mg/dL  38 (L)   Total CHOL/HDL Ratio       4.7   VLDL     0 - 40 mg/dL  23   LDL (calc)     0 - 99 mg/dL  478 (H)   Interpretation-PTGENE:      REPORT    Recommendations-PTGENE:      REPORT    Reviewer      REPORT    Beta-2 Glyco I IgG     <20 G Units 0    Beta-2-Glycoprotein I IgM     <20 M Units 1  Beta-2-Glycoprotein I IgA     <20 A Units  27 (H)    Interpretation-F5LEID:      REPORT    Recommendations-F5LEID:      REPORT    Reviewer      REPORT    Anticardiolipin IgG     <23 GPL U/mL 8 (L)    Anticardiolipin IgM     <11 MPL U/mL 0 (L)    Anticardiolipin IgA     <22 APL U/mL 7 (L)    C-ANCA     Neg:<1:20 titer   <1:20  P-ANCA     Neg:<1:20 titer   <1:20  Atypical pANCA     Neg:<1:20 titer   <1:20  Hgb A1c MFr Bld     <5.7 %  8.6 (H)   Mean Plasma Glucose     <117 mg/dL  161 (H)   Protein C Activity     75 - 133 % 154 (H)    Protein C, Total     72 - 160 % 135    Protein S Activity     69 - 129 % 51 (L)    Protein S Ag, Total     60 - 150 % 64    AntiThromb III Func     75 - 120 % 95    Homocysteine     4.0 - 15.4 umol/L 11.7    C3 Complement     90 - 180 mg/dL  096   Complement C4, Body Fluid     10 - 40 mg/dL  42 (H)   Compl, Total (CH50)     31 - 60 U/mL  >60 (H)   Sed Rate     0 - 22 mm/hr  18   ANA Ser Ql     NEGATIVE  NEGATIVE   RPR     NON REAC  NON REAC   HIV     NONREACTIVE  NONREACTIVE   Sickle Cell Prep     Negative   Negative   Component     Latest Ref Rng & Units 02/21/2015 07/03/2016 07/07/2016 07/13/2016  Cholesterol     0 - 200 mg/dL 045     Triglycerides     <150 mg/dL 409     HDL Cholesterol     >40 mg/dL 29 (L)     Total CHOL/HDL Ratio     RATIO 4.9     VLDL     0 - 40 mg/dL 28     LDL (calc)     0 - 99 mg/dL 84     Hemoglobin W1X     4.8 - 5.6 %    7.4 (H)  Mean Plasma Glucose     mg/dL    914  Platelet Function  [name]     194 - 418 PRU  2 (L) 21 (L) 9 (L)   Component     Latest Ref Rng & Units 07/17/2016 07/21/2016 07/24/2016 07/25/2016  Cholesterol     0 - 200 mg/dL      Triglycerides     <150 mg/dL      HDL Cholesterol     >40 mg/dL      Total CHOL/HDL Ratio     RATIO      VLDL     0 - 40 mg/dL      LDL (calc)     0 - 99 mg/dL      Hemoglobin N8G     4.8 -  5.6 %      Mean Plasma Glucose     mg/dL      Platelet Function  [name]     194 - 418 PRU 100  (L) 118 (L) 20 (L) 97 (L)   Component     Latest Ref Rng & Units 07/28/2016 07/29/2016  Cholesterol     0 - 200 mg/dL  914121  Triglycerides     <150 mg/dL  782149  HDL Cholesterol     >40 mg/dL  39 (L)  Total CHOL/HDL Ratio     RATIO  3.1  VLDL     0 - 40 mg/dL  30  LDL (calc)     0 - 99 mg/dL  52  Hemoglobin N5AA1C     4.8 - 5.6 %  7.2 (H)  Mean Plasma Glucose     mg/dL  213160  Platelet Function  [name]     194 - 418 PRU 116 (L)     Assessment: As you may recall, she is a 51 y.o. AA  female with PMH of obesity, hypertension, diabetes, hyperlipidemia, previous embolic stroke in 2009 presumably due to PFO/ASD s/p closure by Dr. Jacinto HalimGanji was admitted in 04/2014 for new left MCA territory multiple small strokes. She had cardioembolic stroke in 2009 and it attributed to PFO at the time. The current stroke was confined in the left MCA territory. She does have intracranial stenosis more prominent at left MCA M1 and bilateral ACA which has significant progress from her CTA head and neck in 2009. She does have multiple stroke risk factors including HTN, HLD, and uncontrolled DM. Repeat CTA no change from prior. TCD bubble study consistent with PFO s/p closure. Vasculitis and hypercoagulable work up negative. Finished dural antiplatelet, and kept on ASA 325mg  as she has allergic reaction to plavix.   During the interval time, pt continues to complain intermittent speech difficulty, had MRI again 12/2015 negative. EEG no seizure. However, in 05/2016 pt had right side weakness and dysarthria. MRI showed left MCA patchy cortical infarct. CTA head and neck showed left M1 high grade stenosis. she was referred to Dr. Corliss Skainseveshwar for consideration of intracranial stent. She had left MCA stent on 07/24/16, put on ASA and brilinta. However, she was readmitted on 07/28/16 for nonocclusive thrombus in the left MCA stent. MRI showed patchy left MCA cortical infarcts. Pt underwent angioplasty for in-stent stenosis. LDL 52 and A1C 7.4.  She was continued on ASA and brilinta and pravastatin on discharge.  Currently still has right facial droop and mild right hand dexterity difficulty, not able to write well, but language intact. Followed with Dr. Corliss Skainseveshwar and repeat cerebral angio on 12/05/16 showed 70-75% in stent stenosis. Repeat MRI and MRA in 05/2017 showed no acute stroke and left MCA stent patent with good distal flow.  She still complains of HA. In the past has tried topamax but stopped due to cognitive issue. Has tried inderal long time ago, not effective and currently she is on multiple BP meds including beta bloker. She has tried keppra, depakote, neurontin and amitriptyline all not effective. Like to avoid imitrex due to stroke. Has tried occipital nerve block but not effective. Also off lamictal now. Saw Dr. Lucia GaskinsAhern and had botox x 2 and aimovig x 2 but not effective also. [facility]   Plan:  - continue ASA and brilinta and prevastatin for stroke prevntion - check BP and glucose at home.  - follow with [facility] neurology for  headache treatment - recommend more active at home, avoid depression, anxiety - Follow up with your primary care physician for stroke risk factor modification. Recommend maintain blood pressure goal 120-140/80, diabetes with hemoglobin A1c goal below 7.0% and lipids with LDL cholesterol goal below 70 mg/dL.  - follow up with Dr. Corliss Skains as scheduled. - follow up in 6 months   No orders of the defined types were placed in this encounter.  No orders of the defined types were placed in this encounter.   Patient Instructions  - continue ASA and brilinta and prevastatin for stroke prevntion - check BP and glucose at home.  - follow with [facility] neurology for headache treatment - recommend more active at home, avoid depression, anxiety - Follow up with your primary care physician for stroke risk factor modification. Recommend maintain blood pressure goal 120-140/80,  diabetes with hemoglobin A1c goal below 7.0% and lipids with LDL cholesterol goal below 70 mg/dL.  - follow up with Dr. Corliss Skains as scheduled. - follow up in 6 months   Marvel Plan, MD PhD [facility] [address], [address] [place], [place] [address] [phone]

## 2017-07-06 ENCOUNTER — Telehealth (HOSPITAL_COMMUNITY): Payer: Self-pay

## 2017-07-06 NOTE — Telephone Encounter (Signed)
Pt agreed to f/u in 6 months with a mri/mra. AW

## 2017-07-23 ENCOUNTER — Ambulatory Visit: Payer: BLUE CROSS/BLUE SHIELD

## 2017-07-27 ENCOUNTER — Other Ambulatory Visit: Payer: Self-pay | Admitting: Internal Medicine

## 2017-07-27 DIAGNOSIS — Z1231 Encounter for screening mammogram for malignant neoplasm of breast: Secondary | ICD-10-CM

## 2017-07-30 ENCOUNTER — Other Ambulatory Visit: Payer: Self-pay

## 2017-07-30 MED ORDER — ERENUMAB-AOOE 70 MG/ML ~~LOC~~ SOAJ: 70.0000 mg | SUBCUTANEOUS | 12 refills | Status: DC

## 2017-08-01 ENCOUNTER — Telehealth: Payer: Self-pay

## 2017-08-01 ENCOUNTER — Other Ambulatory Visit: Payer: Self-pay

## 2017-08-01 MED ORDER — ERENUMAB-AOOE 70 MG/ML ~~LOC~~ SOAJ: 70.0000 mg | SUBCUTANEOUS | 12 refills | Status: DC

## 2017-08-01 NOTE — Telephone Encounter (Signed)
Late entry: Rn spoke with patient on 07/30/2017. Rn stated her injections rx will not be at [facility]. Rn stated due to deadline of them being able to get the access card. Pt verbalized understanding.

## 2017-08-02 ENCOUNTER — Telehealth: Payer: Self-pay | Admitting: Neurology

## 2017-08-02 NOTE — Telephone Encounter (Signed)
D/w work-in MD, patient is on multiple medications and has taken Aimovig 2x already as prescribed. At this time no further changes to med plan. She is still concerned about her persistant headache and nausea. I told her I would discuss again w/ MD.

## 2017-08-02 NOTE — Telephone Encounter (Signed)
Called patient, she reports no relief in headache or nausea. She is on Aimovig, Zonisamide, has also taken phenergan but with no relief. I will d/w work-in MD and call her back.

## 2017-08-02 NOTE — Telephone Encounter (Signed)
Patient called and stated she has had a headache for over two weeks and is in extreme pain and needs some relief. Please call and discuss.

## 2017-08-05 NOTE — Telephone Encounter (Signed)
I have recommended that she be seen at [facility] headache clinic since she has been refractory to every medication this is onoging for years, She has been on multiple medications including botox and the new CGRP medications without relief. She needs to be seen at [facility] or [place].  I placed a referral for her to the [facility] back in August and discussed this with her. Please remind her this is what we decided to do. Annabelle Harman can follow up on the referral. thanks

## 2017-08-06 NOTE — Telephone Encounter (Signed)
Called and spoke to Patricia [name]. Patient is scheduled with Dr. Jill Alexanders Mhoon December 3rd at 2:00 . Patient is On CX list . I Tried to get patient a sooner apt [facility] just does not have anything..  I have called and spoke to patient she was very appreciative. Patient is aware of her apt. Thanks Annabelle Harman.

## 2017-08-09 ENCOUNTER — Ambulatory Visit
Admission: RE | Admit: 2017-08-09 | Discharge: 2017-08-09 | Disposition: A | Discharge status: Home / Self Care | Payer: BLUE CROSS/BLUE SHIELD | Source: Ambulatory Visit | Attending: Internal Medicine | Admitting: Internal Medicine

## 2017-08-09 DIAGNOSIS — Z1231 Encounter for screening mammogram for malignant neoplasm of breast: Secondary | ICD-10-CM

## 2017-08-09 NOTE — Telephone Encounter (Signed)
PA is required for Aimvoig. Pt already getting free samples. PA done on cover my meds. Its pending takes 3 to 7 days.

## 2017-08-10 NOTE — Telephone Encounter (Signed)
Received fax from patient's insurance that the Aimovig has been denied.

## 2017-08-13 NOTE — Telephone Encounter (Signed)
PA was getting botox too.

## 2017-08-16 ENCOUNTER — Encounter: Payer: Self-pay | Admitting: Neurology

## 2017-08-20 ENCOUNTER — Telehealth: Payer: Self-pay | Admitting: Neurology

## 2017-08-20 NOTE — Telephone Encounter (Signed)
Returned call and LVM asking for call back. 

## 2017-08-20 NOTE — Telephone Encounter (Signed)
Pt called she needs clarification on how the  aimovig will be sent to her. She has not had a dose for October. Please call to discuss

## 2017-08-21 ENCOUNTER — Telehealth: Payer: Self-pay | Admitting: Neurology

## 2017-08-21 NOTE — Telephone Encounter (Signed)
Patricia [name], you took care of this patient's Aimovig, can you please call in a prescription to her pharmacy and call her, SHE WANTS AIMOVIG (see email) thanks

## 2017-08-21 NOTE — Telephone Encounter (Signed)
RN call patient about to discuss Aimovig with her. Pt stated she has three injections, and this month will be her fourth injection. PT stated the company was sending them monthly. This month she did not get the medication. Rn recommend she call the toll free number of [phone] to set up for the free samples. Rn stated her PA was denied due to her getting botox within the last 3 months. Pt stated she will call the number about access card, and set up.

## 2017-08-21 NOTE — Telephone Encounter (Signed)
See phone note on 08/21/2017 today.

## 2017-11-29 ENCOUNTER — Other Ambulatory Visit (HOSPITAL_COMMUNITY): Payer: Self-pay | Admitting: Interventional Radiology

## 2017-11-29 DIAGNOSIS — I771 Stricture of artery: Secondary | ICD-10-CM

## 2017-12-20 ENCOUNTER — Ambulatory Visit (HOSPITAL_COMMUNITY): Payer: BLUE CROSS/BLUE SHIELD

## 2017-12-20 ENCOUNTER — Ambulatory Visit (HOSPITAL_COMMUNITY)
Admission: RE | Admit: 2017-12-20 | Discharge: 2017-12-20 | Disposition: A | Discharge status: Home / Self Care | Payer: BLUE CROSS/BLUE SHIELD | Source: Ambulatory Visit | Attending: Interventional Radiology | Admitting: Interventional Radiology

## 2017-12-20 DIAGNOSIS — Z8673 Personal history of transient ischemic attack (TIA), and cerebral infarction without residual deficits: Secondary | ICD-10-CM | POA: Diagnosis not present

## 2017-12-20 DIAGNOSIS — Z95828 Presence of other vascular implants and grafts: Secondary | ICD-10-CM | POA: Insufficient documentation

## 2017-12-20 DIAGNOSIS — I771 Stricture of artery: Secondary | ICD-10-CM | POA: Diagnosis not present

## 2017-12-20 LAB — CREATININE, SERUM
CREATININE: 1.47 mg/dL — AB (ref 0.44–1.00)
GFR calc Af Amer: 47 mL/min — ABNORMAL LOW (ref >60)
GFR calc non Af Amer: 41 mL/min — ABNORMAL LOW (ref >60)

## 2017-12-20 MED ORDER — GADOBENATE DIMEGLUMINE 529 MG/ML IV SOLN
20.0000 mL | Freq: Once | INTRAVENOUS | Status: AC | PRN
  Administered 2017-12-20: 20 mL via INTRAVENOUS

## 2017-12-25 ENCOUNTER — Ambulatory Visit (INDEPENDENT_AMBULATORY_CARE_PROVIDER_SITE_OTHER): Payer: BLUE CROSS/BLUE SHIELD | Admitting: Neurology

## 2017-12-25 ENCOUNTER — Encounter: Payer: Self-pay | Admitting: Neurology

## 2017-12-25 VITALS — BP 130/87 | HR 83 | Wt 222.4 lb

## 2017-12-25 DIAGNOSIS — G43711 Chronic migraine without aura, intractable, with status migrainosus: Secondary | ICD-10-CM

## 2017-12-25 DIAGNOSIS — I10 Essential (primary) hypertension: Secondary | ICD-10-CM

## 2017-12-25 DIAGNOSIS — E118 Type 2 diabetes mellitus with unspecified complications: Secondary | ICD-10-CM

## 2017-12-25 DIAGNOSIS — Z794 Long term (current) use of insulin: Secondary | ICD-10-CM | POA: Diagnosis not present

## 2017-12-25 DIAGNOSIS — E785 Hyperlipidemia, unspecified: Secondary | ICD-10-CM | POA: Diagnosis not present

## 2017-12-25 DIAGNOSIS — I63512 Cerebral infarction due to unspecified occlusion or stenosis of left middle cerebral artery: Secondary | ICD-10-CM

## 2017-12-25 MED ORDER — ASPIRIN EC 325 MG PO TBEC: 325.0000 mg | DELAYED_RELEASE_TABLET | Freq: Every day | ORAL | Status: DC

## 2017-12-25 NOTE — Progress Notes (Addendum)
STROKE NEUROLOGY FOLLOW UP NOTE  NAME: Patricia [name] DOB: Mar 14, 1966  REASON FOR VISIT: stroke follow up HISTORY FROM: pt and chart  Today we had the pleasure of seeing Patricia [name] in follow-up at our Neurology Clinic. Pt was accompanied by daughter and granddaughter.   History Summary 52 y.o. African American female with PMH of obesity, hypertension, diabetes, hyperlipidemia, previous embolic stroke in 2009 presumably due to PFO/ASD state post of closure by Dr. Jacinto Halim was admitted on 05/22/14 for right arm and leg numbness weakness, difficulty with speech. MRI had showed left MCA territory small multiple infarcts. MRA head showed significant intracranial stenosis. She was discharged on aspirin and Plavix dural antiplatelet therapy and outpatient PT/OT.  She had a previous CVA in 2009, MRI showed multiple bilateral small infarcts consistent with cardioembolic stroke. She was found by PFO/ASD, state post of a PFO closure with Dr. Jacinto Halim. She stated she was treated with aspirin and Plavix for some time and then Dr. Jacinto Halim took her off Plavix. She had no residual deficit left from previous stroke.  She had multiple stroke risk factors including hypertension, hyperlipidemia, diabetes, obesity. She stated that she was not a compliant with medication in the past and he now watch for her diet.  She denies any cigarette smoking, alcohol drinking, or illicit drugs.  06/17/14 follow up -  the patient has been doing well. She still not able to write with right hand, and she is getting PT/OT 2/wk. Her BP at home was OK, today in clinic 146/93. Her BP has been low lately so Dr. Jacinto Halim decreased her bystolic. She saw her PCP early this month and changed her from plavix to crestor due to questionable rashes. Continue her on PO meds for DM and Sugar this morning 117.   08/27/14 follow up - she was doing well. Her BP today in clinic was 115/80 and she stated that her sugar level at home was 113,  190, 130 for the last 3 days. Her CTA head and neck was stable comparing with prior, still showing left MCA M1 high grade stenosis as well as bilateral ACA stenosis with diffuse intracranial atherosclerosis. Vasculitis work up negative. TCD bubble study consistent with PFO s/p closure. She has allergic reaction to plavix, so PCP put her on ASA 81mg  bid and cilostazol for dural antiplatelet.  However, she complained in clinic today about her headache. She had headache for the last 30 years, the pain are at the back of her head at skull base line, with pain at the back of right eye. 2-3 per month, usually lasting for 2 days. 10/10, phonophobia but no photophobia, nausea but no vomiting. No neck pain. However, for the last 3 weeks, she has similar headache not go away. She takes percocet and phernagen but not effective.  09/07/15 follow up - pt has been doing the same. She had one episode of right arm and leg weakness, word finding difficulty after a long day (work that day, rushed to pick up her grandson for football game, restaurant to eat and drove back). She was admitted to OSH, CT head and MRI showed no acute abnormalities. Normal TTE. She was discharged with resolution of symptoms, likely due to recrudescence of previous stroke.   She still complains of headache with neck pain, consistent with occipital neuralgia. She had occipital nerve block last visit, but she said that was not very effective. She was also on amitriptyline for the headache but she stopped it as it was  also not effective. Currently she was put on fioricet and percocet for the headache with aware of rebound headache. I recommended to her pain management referral to consider radioablation of occipital nerve.   03/23/16 follow up - pt has been doing fine from stroke standpoint. She recently complains that she has the episodes of difficulty speaking, getting words out, on and off, increasingly frequent. She has similar episodes 07/2015 and  MRI negative for stroke. She again had another MRI 12/2015 and negative for stroke. Her symptoms are likely recrudescence from her previous strokes in the setting of fatigue, stress, anxiety, depression, infection and etc. However, she stated that the episodes happened randomly, not associated with above triggers. She also denies association with HA or stress. However, she stated that the episodes seem to happen when her BP was low, as low as 100/60. Currently in clinic, she has no deficit and still in depressed mood.  She also complains of HA which has been going on for long time. She has daily HA, has tried inderal, depakote, amitriptyline, topamax, imitrex, botox but none of them worked either due to side effects or ineffectiveness. She also had LP for CSF drainage due to ? Increased intracranial pressure, but still not effective. She denies tension type HA.   08/23/16 follow up - pt had EEG for speech difficulty episode work up which was negative. However, pt started to feel right hand weakness and intermittent word finding difficulties in 05/2016. Repeat MRI showed punctate left MCA stroke, same distribution as her stroke 2 years ago. She does have high grade stenosis of left M1, but denies any low BP or dehydration. Due to recurrent stroke at same territory of high grade stenosis of left M1, she was referred to Dr. Corliss Skains for consideration of intracranial stent. She had left MCA stent on 07/24/16, and put on ASA and brilinta. However, she was readmitted on 07/28/16 for left sided weakness and dysarthria. CTA head and neck showed nonocclusive thrombus in the left MCA stent. MRI showed patchy left MCA cortical infarcts. Pt underwent angioplasty for in-stent stenosis. LDL 52 and A1C 7.4. She was continued on ASA and brilinta and pravastatin on discharge.  Currently still has right facial droop and mild right hand dexterity difficulty, not able to write well. Low energy level and easily tired. Finished home  PT/OT and on home exercise now.   She still complains of HA since last week. In the past has tried topamax but stopped due to cognitive issue. Has tried inderal long time ago, not effective and currently she is on multiple BP meds including beta bloker. She has tried keppra, depakote, neurontin and amitriptyline all not effective. Like to avoid imitrex due to stroke. Has tried occipital nerve block but not effective. On lamictal 100mg  bid now. Had botox for HA but not effective also.   12/25/16 follow up - pt has been doing well. She had one episode of N/V and HA on 11/14/16 and she went to ED, had CT head which showed no acute abnormalities. Her symptoms resolved and she was discharged home. She followed with Dr. Corliss Skains on 12/05/16 and repeat angiogram showed approximately 70-75% stenosis within the mid portion of the stented segment of the left MCA most likely secondary to a combination of near intimal hyperplasia and atherosclerotic plaque. Recommended MRA and MRI in 6 months. She saw Dr. Lucia Gaskins on 09/13/16 for HA management and will get botox tomorrow. BP at home less than 130, and 130/68 today, glucose at home 125-136.  She is back to work, right hand function continues to improve and language now normal.  06/26/17 follow up - patient had been doing largely the same. Still complained of chronic daily headache, varies from day to day, more on the right side of the head. This can be a achy pain or pounding or even sharp pain in the head, last at least half a day, nauseous but responding to phenergan. Has been following with Dr. Lucia Gaskins, tried Botox 2, aimovig x 2, but not effective, she was referred to [facility] neurolog[facility] headache [facility]. Stated that blood pressure and glucose under good control. Had repeat MRA and MRI head, no acute stroke, left MCA stent patent. Patient has not seen Dr. Corliss Skains for yet. BP today 145/97.  Interval History During the interval time, pt has been doing the same from stroke  standpoint. Repeat MRI and MRA in 11/2017 showed stable left MCA stent and distal flow, same as last time in 05/2017. Pt no stroke like symptoms. She followed with [facility] neurology for HA and was put on lyrica. Her PCP also decreased percocet to prevent rebound HA. BP today 130/87. Her glucose meter was broken recently and did not check glucose level. She seems less depressed and excited going to [place] next month for her sister's wedding.   REVIEW OF SYSTEMS: Full 14 system review of systems performed and notable only for those listed below and in HPI above, all others are negative:   none  The following represents the patient's updated allergies and side effects list: Allergies  Allergen Reactions  . Imitrex [Sumatriptan] Anaphylaxis  . Triptans Anaphylaxis and Hives  . Plavix [Clopidogrel Bisulfate] Hives  . Cymbalta [Duloxetine Hcl] Nausea Only  . Topamax [Topiramate]     Memory issues  . Lipitor [Atorvastatin] Rash   The neurologically relevant items on the patient's problem list were reviewed on today's visit.  Neurologic Examination  A problem focused neurological exam (12 or more points of the single system neurologic examination, vital signs counts as 1 point, cranial nerves count for 8 points) was performed.  Blood pressure 130/87, pulse 83, weight 222 lb 6.4 oz (100.9 kg).  General - obese, well developed, mildly depressed.  Ophthalmologic - Sharp disc margins OU.  Cardiovascular - Regular rate and rhythm with no murmur.  Mental Status -  Level of arousal and orientation to time, place, and person were intact. Language including expression, naming, repetition, comprehension, reading, and writing was assessed and found intact.  Cranial Nerves II - XII - II - Visual field intact OU. III, IV, VI - Extraocular movements intact. V - Facial sensation intact bilaterally. VII - mild right nasolabial fold flattening. VIII - Hearing & vestibular intact bilaterally. X - Palate  elevates symmetrically. XI - Chin turning & shoulder shrug intact bilaterally. XII - Tongue protrusion intact.  Motor Strength - The patient's strength was normal in all extremities except subtle decreased right hand dexterity.  Bulk was normal and fasciculations were absent.   Motor Tone - Muscle tone was assessed at the neck and appendages and was normal.  Reflexes - The patient's reflexes were normal extremities and she had no pathological reflexes.  Sensory - Light touch, temperature/pinprick and Romberg testing were assessed and were normal.    Coordination - The patient had normal movements in the hands and feet with no ataxia or dysmetria.  Tremor was absent.  Gait and Station - The patient's transfers, posture, gait, station, and turns were observed as normal.  Data reviewed:  I personally reviewed the images and agree with the radiology interpretations.  Mr Shirlee Latch Wo Contrast  05/22/2014  Multiple acute infarctions in the left hemisphere that could be a combination of watershed infarctions and micro embolic infarctions.   MRA  05/22/2014  Advanced intracranial disease with very limited flow and the anterior cerebral artery territories and multiple middle cerebral stenoses, particularly severe and notable at the M1 segment on the left with the vessel shows 90% stenosis. Distal branch vessels appear reduced in number an show irregularity. The findings could be due to widespread advanced atherosclerotic disease or vasculitis. This represents a marked worsening of disease since 2009. Old infarctions affecting the thalami, hemispheric white matter, genu of the corpus callosum and right parietal cortex at the vertex.   Dg Chest Port 1 View  05/22/2014  There is no active cardiopulmonary disease.   Carotid Doppler Preliminary report: 1-39% ICA stenosis. Vertebral artery flow is antegrade.  2D Echocardiogram Left ventricle: The cavity size was at the upper limits ofnormal. Systolic  function was normal.  The estimated ejection fraction was in the range of 55% to 60%. Wall motion was normal; there were no regional wall motion abnormalities. - Atrial septum: There is no color Doppler e/o residual inter atrial shunting. Flow present within the right atrial disk without crossing the septum (normal).  An Amplatzer closure device in the fossa ovalis region was present.  There was no atrial level shunt. No evidence of thrombus.  CTA head and neck 07/03/14 1. Severe proximal left MCA stenosis. Mild proximal right MCA stenosis. Bilateral MCA branch vessel irregularity and attenuation. Occlusion of the anterior cerebral arteries at their origins with some reconstituted but severely diminished flow distally. Mild left P2 stenosis. Considerations again include advanced atherosclerosis and vasculitis. 2. No evidence of cervical carotid or vertebral artery stenosis.  TCD bubble study 07/06/14 Both middle cerebral arteries could not be insonated using a headset due to poor bitemporal windows and procedure was done insonating left vertebral artery with a hand held probe. And IV line was inserted in the left elbow by the RN using aseptic precautions. Agitated saline injection at rest and after valsalva maneuver did result in several high intensity transient signals (HITS).  Impression: Positive Transcranial Doppler Bubble Study indicative of small residual right to left intracardiac shunt despite endovascular PFO closure.   MRI and MRA head 02/2015 -  MRI HEAD: No acute intracranial process, specifically no acute Infarct. Multifocal small areas of encephalomalacia corresponding to prior LEFT cerebral remote infarcts. Remote bilateral basal ganglia and thalamus lacunar infarcts. Moderate white matter changes suggest chronic small vessel ischemic disease.  MRA HEAD: Focal high-grade stenosis of LEFT M1 segment, stable. Chronically occluded RIGHT anterior cerebral artery, with thready  irregular LEFT anterior cerebral artery, unchanged. Thready, somewhat narrowed basilar artery, worse than prior examination though this may be accentuated by motion artifact. Moderate stenosis LEFT P2 segment, worse than prior imaging. Improved appearance of the LEFT middle cerebral artery from prior imaging, suggesting prior vasculitis.  MRI brain 01/05/16 No acute or subacute infarction. Old infarctions affecting the thalami, basal ganglia, genu of the corpus callosum, hemispheric white matter and left and right cortical and subcortical infarctions at the vertex.  EEG 05/23/16 -  Normal EEG in the awake and drowsy state.  MR Brain Wo Contrast  07/29/2016 1. Patchy multi focal acute ischemic predominantly cortical left MCA territory infarcts as above. Associated petechial hemorrhage without frank hemorrhagic transformation. No significant mass effect. 2. Mild chronic microvascular  ischemic disease with additional scattered remote lacunar infarcts as above.  Ct Angio Head and Neck W Or Wo Contrast 07/28/2016 1.Possible nonocclusive thrombus in the left MCA stent.Abnormally prolonged transit throughout the left MCA superior division without evidence of acute core infarct or large vessel occlusion.  2. Chronically occluded or severely diseased A1 segments.  3. Mild right M1 MCA stenosis.   Ct Head Wo Contrast 07/28/2016 1. No acute intracranial abnormalities.  2. 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 Minor chronic microvascular ischemic change.  3. Left middle cerebral artery stent new since the prior study. No other change.   Ct Cerebral Perfusion W Contrast 07/28/2016 1. Possible nonocclusive thrombus in the left MCA stent. Abnormally prolonged transit throughout the left MCA superior division without evidence of acute core infarct or large vessel occlusion.  2. Chronically occluded or severely diseased A1 segments.  3. Mild right M1 MCA stenosis.    12/05/16 Approximately 70-75% stenosis within the mid portion of the stented segment of the left middle cerebral artery most likely secondary to a combination of near intimal hyperplasia and atherosclerotic plaque. Hypoplastic right anterior cerebral artery A1 segment. Nonvisualization of the left anterior cerebral artery A1 segment. Dominant posterior communicating arteries bilaterally.  Mri and Mra Head Wo Contrast 06/07/2017 IMPRESSION: MRI head: 1. No acute intracranial abnormality or abnormal enhancement. 2. Small chronic infarctions are present in left frontal centrum semiovale and bilateral caudate heads. 3. Mild chronic microvascular ischemic changes of white matter. MRA head: 1. Long segment flow void of left M1 corresponding to intra-arterial stent. Assessment of luminal stenosis is not possible due to susceptibility artifact. Bilateral M2 and distal MCA circulation is symmetric. 2. Hypoplastic right A1, nonvisualized left A1, and poor downstream flow related signal in the bilateral anterior cerebral arteries, similar to prior cerebral angiogram given differences in technique. 3. Intracranial atherosclerosis with areas of stable mild-to-moderate stenosis in the anterior and posterior circulation.    MRI and MRA 11/2017 MRI of the brain and intracranial MRI angiography are unchanged since the study of 06/07/2017. Old ischemic changes on the left related to watershed infarction. Multiple other old small infarctions as outlined above are stable as well. Left MCA stent. Signal loss related to that stent. Distal vessels show flow in a similar pattern. 50% stenosis of the right M1 distally, unchanged. Nonvisualization of either anterior cerebral artery A1 segment.   Component     Latest Ref Rng 05/22/2014 05/23/2014 06/17/2014  PTT Lupus Anticoagulant     28.0 - 43.0 secs 32.0    PTTLA Confirmation     <8.0 secs NOT APPL    PTTLA 4:1 Mix     28.0 - 43.0 secs NOT APPL    DRVVT      <42.9 secs 36.1    Drvvt confirmation     <1.15 Ratio NOT APPL    dRVVT Incubated 1:1 Mix     <42.9 secs NOT APPL    Lupus Anticoagulant     NOT DETECTED NOT DETECTED    ENA RNP Ab     0.0 - 0.9 AI   <0.2  ENA SM Ab Ser-aCnc     0.0 - 0.9 AI   <0.2  Rhuematoid fact SerPl-aCnc     0.0 - 13.9 IU/mL   8.3  Chromatin Ab SerPl-aCnc     0.0 - 0.9 AI   <0.2  ENA SSA (RO) Ab     0.0 - 0.9 AI   0.8  ENA SSB (LA) Ab  0.0 - 0.9 AI   0.2  dsDNA Ab     0 - 9 IU/mL   <1  Cholesterol     0 - 200 mg/dL  409   Triglycerides     <150 mg/dL  811   HDL     >91 mg/dL  38 (L)   Total CHOL/HDL Ratio       4.7   VLDL     0 - 40 mg/dL  23   LDL (calc)     0 - 99 mg/dL  478 (H)   Interpretation-PTGENE:      REPORT    Recommendations-PTGENE:      REPORT    Reviewer      REPORT    Beta-2 Glyco I IgG     <20 G Units 0    Beta-2-Glycoprotein I IgM     <20 M Units 1    Beta-2-Glycoprotein I IgA     <20 A Units 27 (H)    Interpretation-F5LEID:      REPORT    Recommendations-F5LEID:      REPORT    Reviewer      REPORT    Anticardiolipin IgG     <23 GPL U/mL 8 (L)    Anticardiolipin IgM     <11 MPL U/mL 0 (L)    Anticardiolipin IgA     <22 APL U/mL 7 (L)    C-ANCA     Neg:<1:20 titer   <1:20  P-ANCA     Neg:<1:20 titer   <1:20  Atypical pANCA     Neg:<1:20 titer   <1:20  Hgb A1c MFr Bld     <5.7 %  8.6 (H)   Mean Plasma Glucose     <117 mg/dL  295 (H)   Protein C Activity     75 - 133 % 154 (H)    Protein C, Total     72 - 160 % 135    Protein S Activity     69 - 129 % 51 (L)    Protein S Ag, Total     60 - 150 % 64    AntiThromb III Func     75 - 120 % 95    Homocysteine     4.0 - 15.4 umol/L 11.7    C3 Complement     90 - 180 mg/dL  621   Complement C4, Body Fluid     10 - 40 mg/dL  42 (H)   Compl, Total (CH50)     31 - 60 U/mL  >60 (H)   Sed Rate     0 - 22 mm/hr  18   ANA Ser Ql     NEGATIVE  NEGATIVE   RPR     NON REAC  NON REAC   HIV     NONREACTIVE   NONREACTIVE   Sickle Cell Prep     Negative   Negative   Component     Latest Ref Rng & Units 02/21/2015 07/03/2016 07/07/2016 07/13/2016  Cholesterol     0 - 200 mg/dL 308     Triglycerides     <150 mg/dL 657     HDL Cholesterol     >40 mg/dL 29 (L)     Total CHOL/HDL Ratio     RATIO 4.9     VLDL     0 - 40 mg/dL 28     LDL (calc)     0 - 99 mg/dL 84  Hemoglobin A1C     4.8 - 5.6 %    7.4 (H)  Mean Plasma Glucose     mg/dL    409166  Platelet Function  [name]     194 - 418 PRU  2 (L) 21 (L) 9 (L)   Component     Latest Ref Rng & Units 07/17/2016 07/21/2016 07/24/2016 07/25/2016  Cholesterol     0 - 200 mg/dL      Triglycerides     <150 mg/dL      HDL Cholesterol     >40 mg/dL      Total CHOL/HDL Ratio     RATIO      VLDL     0 - 40 mg/dL      LDL (calc)     0 - 99 mg/dL      Hemoglobin W1XA1C     4.8 - 5.6 %      Mean Plasma Glucose     mg/dL      Platelet Function  [name]     194 - 418 PRU 100 (L) 118 (L) 20 (L) 97 (L)   Component     Latest Ref Rng & Units 07/28/2016 07/29/2016  Cholesterol     0 - 200 mg/dL  914121  Triglycerides     <150 mg/dL  782149  HDL Cholesterol     >40 mg/dL  39 (L)  Total CHOL/HDL Ratio     RATIO  3.1  VLDL     0 - 40 mg/dL  30  LDL (calc)     0 - 99 mg/dL  52  Hemoglobin [id number]     4.8 - 5.6 %  7.2 (H)  Mean Plasma Glucose     mg/dL  213160  Platelet Function  [name]     194 - 418 PRU 116 (L)     Assessment: As you may recall, she is a 52 y.o. AA  female with PMH of obesity, hypertension, diabetes, hyperlipidemia, previous embolic stroke in 2009 presumably due to PFO/ASD s/p closure by Dr. Jacinto HalimGanji was admitted in 04/2014 for new left MCA territory multiple small strokes. She had cardioembolic stroke in 2009 and it attributed to PFO at the time. The current stroke was confined in the left MCA territory. She does have intracranial stenosis more prominent at left MCA M1 and bilateral ACA which has significant progress from her CTA head and neck in 2009.  She does have multiple stroke risk factors including HTN, HLD, and uncontrolled DM. Repeat CTA no change from prior. TCD bubble study consistent with PFO s/p closure. Vasculitis and hypercoagulable work up negative. Finished dural antiplatelet, and kept on ASA 325mg  as she has allergic reaction to plavix.   During the interval time, pt continues to complain intermittent speech difficulty, had MRI again 12/2015 negative. EEG no seizure. However, in 05/2016 pt had right side weakness and dysarthria. MRI showed left MCA patchy cortical infarct. CTA head and neck showed left M1 high grade stenosis. she was referred to Dr. Corliss Skainseveshwar for consideration of intracranial stent. She had left MCA stent on 07/24/16, put on ASA and brilinta. However, she was readmitted on 07/28/16 for nonocclusive thrombus in the left MCA stent. MRI showed patchy left MCA cortical infarcts. Pt underwent angioplasty for in-stent stenosis. LDL 52 and A1C 7.4. She was continued on ASA and brilinta and pravastatin on discharge.  Currently still has right facial droop and mild right hand dexterity difficulty, not able to write well, but  language intact. Followed with Dr. Corliss Skains and repeat cerebral angio on 12/05/16 showed 70-75% in stent stenosis. Repeat MRI and MRA in 05/2017 showed no acute stroke and left MCA stent patent with good distal flow. Repeat MRI and MRA again in 11/2016 showed the same. Will d/c brilinta and increase ASA to 325mg . Pt not able to take plavix due to allergy.  She still complains of HA. In the past has tried topamax but stopped due to cognitive issue. Has tried inderal long time ago, not effective and currently she is on multiple BP meds including beta bloker. She has tried keppra, depakote, neurontin and amitriptyline all not effective. Like to avoid imitrex due to stroke. Has tried occipital nerve block but not effective. Also off lamictal now. Saw Dr. Lucia Gaskins and had botox x 2 and aimovig x 2 but not effective also.  Referred to [facility] neurology headache clinic and currently put on lyrica.    Plan:  - discontinue brilinta  - increase ASA to 325mg  daily for stroke prevntion - continue pravastatin for stroke prevention - check BP and glucose at home.  - diabetic diet and regular exercise.  - follow with [facility] neurolog[facility] for headache treatment - recommend more active at home, avoid depression, anxiety - Follow up with your primary care physician for stroke risk factor modification. Recommend maintain blood pressure goal 120-140/80, diabetes with hemoglobin A1c goal below 7.0% and lipids with LDL cholesterol goal below 70 mg/dL.  - follow up with Dr. Pearlean Brownie as needed  I spent more than 25 minutes of face to face time with the patient. Greater than 50% of time was spent in counseling and coordination of care. We reviewed neuro images, discussed about platelet use and HA management.   Meds ordered this encounter  Medications  . aspirin EC 325 MG tablet    Sig: Take 1 tablet (325 mg total) by mouth daily.   No orders of the defined types were placed in this encounter.   Patient Instructions  - discontinue brilinta  - increase ASA to 325mg  daily for stroke prevntion - continue pravastatin for stroke prevention - check BP and glucose at home.  - diabetic diet and regular exercise.  - follow with [facility] for headache treatment - recommend more active at home, avoid depression, anxiety - Follow up with your primary care physician for stroke risk factor modification. Recommend maintain blood pressure goal 120-140/80, diabetes with hemoglobin A1c goal below 7.0% and lipids with LDL cholesterol goal below 70 mg/dL.  - follow up as needed   Marvel Plan, MD PhD [facility] [address], [address] [place], [place] [address] [phone]

## 2017-12-25 NOTE — Patient Instructions (Signed)
-   discontinue brilinta  - increase ASA to 325mg  daily for stroke prevntion - continue pravastatin for stroke prevention - check BP and glucose at home.  - diabetic diet and regular exercise.  - follow with [facility] for headache treatment - recommend more active at home, avoid depression, anxiety - Follow up with your primary care physician for stroke risk factor modification. Recommend maintain blood pressure goal 120-140/80, diabetes with hemoglobin A1c goal below 7.0% and lipids with LDL cholesterol goal below 70 mg/dL.  - follow up as needed

## 2018-01-01 ENCOUNTER — Telehealth (HOSPITAL_COMMUNITY): Payer: Self-pay | Admitting: Radiology

## 2018-01-01 NOTE — Telephone Encounter (Signed)
Dr. Roda ShuttersXu asked if patient could stop Brinlinta and go up to Aspirin 325mg  daily. Per Dr. Corliss Skainseveshwar the patient should remain on her Brilinta to protect the stent that he placed. A message was left on the patient's VM to continue her medications as is per Dr. Corliss Skainseveshwar. JM

## 2018-03-06 ENCOUNTER — Other Ambulatory Visit: Payer: Self-pay

## 2018-03-06 ENCOUNTER — Emergency Department (HOSPITAL_COMMUNITY): Payer: BLUE CROSS/BLUE SHIELD

## 2018-03-06 ENCOUNTER — Encounter (HOSPITAL_COMMUNITY): Payer: Self-pay | Admitting: Emergency Medicine

## 2018-03-06 ENCOUNTER — Emergency Department (HOSPITAL_COMMUNITY)
Admission: EM | Admit: 2018-03-06 | Discharge: 2018-03-06 | Disposition: A | Discharge status: Home / Self Care | Payer: BLUE CROSS/BLUE SHIELD | Attending: Emergency Medicine | Admitting: Emergency Medicine

## 2018-03-06 ENCOUNTER — Telehealth (HOSPITAL_COMMUNITY): Payer: Self-pay | Admitting: Radiology

## 2018-03-06 DIAGNOSIS — E119 Type 2 diabetes mellitus without complications: Secondary | ICD-10-CM | POA: Diagnosis not present

## 2018-03-06 DIAGNOSIS — R5383 Other fatigue: Secondary | ICD-10-CM | POA: Diagnosis present

## 2018-03-06 DIAGNOSIS — I10 Essential (primary) hypertension: Secondary | ICD-10-CM | POA: Insufficient documentation

## 2018-03-06 DIAGNOSIS — R519 Headache, unspecified: Secondary | ICD-10-CM

## 2018-03-06 DIAGNOSIS — Z8673 Personal history of transient ischemic attack (TIA), and cerebral infarction without residual deficits: Secondary | ICD-10-CM | POA: Insufficient documentation

## 2018-03-06 DIAGNOSIS — J45909 Unspecified asthma, uncomplicated: Secondary | ICD-10-CM | POA: Diagnosis not present

## 2018-03-06 DIAGNOSIS — R531 Weakness: Secondary | ICD-10-CM | POA: Diagnosis not present

## 2018-03-06 DIAGNOSIS — G43109 Migraine with aura, not intractable, without status migrainosus: Secondary | ICD-10-CM | POA: Diagnosis not present

## 2018-03-06 DIAGNOSIS — Z79899 Other long term (current) drug therapy: Secondary | ICD-10-CM | POA: Insufficient documentation

## 2018-03-06 DIAGNOSIS — R51 Headache: Secondary | ICD-10-CM | POA: Insufficient documentation

## 2018-03-06 LAB — CBC
HCT: 44 % (ref 36.0–46.0)
Hemoglobin: 13.7 g/dL (ref 12.0–15.0)
MCH: 26.9 pg (ref 26.0–34.0)
MCHC: 31.1 g/dL (ref 30.0–36.0)
MCV: 86.4 fL (ref 78.0–100.0)
PLATELETS: 218 10*3/uL (ref 150–400)
RBC: 5.09 MIL/uL (ref 3.87–5.11)
RDW: 13.6 % (ref 11.5–15.5)
WBC: 5.6 10*3/uL (ref 4.0–10.5)

## 2018-03-06 LAB — URINALYSIS, ROUTINE W REFLEX MICROSCOPIC
Bilirubin Urine: NEGATIVE
Glucose, UA: >=500 mg/dL — AB
Hgb urine dipstick: NEGATIVE
Ketones, ur: NEGATIVE mg/dL
Leukocytes, UA: NEGATIVE
Nitrite: NEGATIVE
PROTEIN: NEGATIVE mg/dL
SPECIFIC GRAVITY, URINE: 1.039 — AB (ref 1.005–1.030)
pH: 6 (ref 5.0–8.0)

## 2018-03-06 LAB — COMPREHENSIVE METABOLIC PANEL
ALBUMIN: 3.8 g/dL (ref 3.5–5.0)
ALT: 23 U/L (ref 14–54)
ANION GAP: 10 (ref 5–15)
AST: 26 U/L (ref 15–41)
Alkaline Phosphatase: 143 U/L — ABNORMAL HIGH (ref 38–126)
BILIRUBIN TOTAL: 0.5 mg/dL (ref 0.3–1.2)
BUN: 22 mg/dL — ABNORMAL HIGH (ref 6–20)
CALCIUM: 9.2 mg/dL (ref 8.9–10.3)
CO2: 25 mmol/L (ref 22–32)
Chloride: 107 mmol/L (ref 101–111)
Creatinine, Ser: 1.29 mg/dL — ABNORMAL HIGH (ref 0.44–1.00)
GFR calc non Af Amer: 47 mL/min — ABNORMAL LOW (ref >60)
GFR, EST AFRICAN AMERICAN: 55 mL/min — AB (ref >60)
GLUCOSE: 123 mg/dL — AB (ref 65–99)
Potassium: 4.2 mmol/L (ref 3.5–5.1)
SODIUM: 142 mmol/L (ref 135–145)
TOTAL PROTEIN: 7.3 g/dL (ref 6.5–8.1)

## 2018-03-06 LAB — I-STAT BETA HCG BLOOD, ED (MC, WL, AP ONLY): I-stat hCG, quantitative: 5.8 m[IU]/mL — ABNORMAL HIGH (ref <5)

## 2018-03-06 LAB — CBG MONITORING, ED
GLUCOSE-CAPILLARY: 61 mg/dL — AB (ref 65–99)
Glucose-Capillary: 110 mg/dL — ABNORMAL HIGH (ref 65–99)

## 2018-03-06 MED ORDER — IOPAMIDOL (ISOVUE-370) INJECTION 76%
INTRAVENOUS | Status: AC
  Filled 2018-03-06: qty 50

## 2018-03-06 MED ORDER — KETOROLAC TROMETHAMINE 30 MG/ML IJ SOLN
30.0000 mg | Freq: Once | INTRAMUSCULAR | Status: AC
  Administered 2018-03-06: 30 mg via INTRAVENOUS
  Filled 2018-03-06: qty 1

## 2018-03-06 MED ORDER — IOPAMIDOL (ISOVUE-370) INJECTION 76%
50.0000 mL | Freq: Once | INTRAVENOUS | Status: AC | PRN
Start: 2018-03-06 — End: 2018-03-06
  Administered 2018-03-06: 50 mL via INTRAVENOUS

## 2018-03-06 MED ORDER — PROMETHAZINE HCL 25 MG/ML IJ SOLN
12.5000 mg | Freq: Once | INTRAMUSCULAR | Status: AC
  Administered 2018-03-06: 12.5 mg via INTRAVENOUS
  Filled 2018-03-06: qty 1

## 2018-03-06 NOTE — ED Provider Notes (Addendum)
Pt seen by Dr Rubin Payor.  Please see his note.  Labs pending.  Anticipate discharge.   Linwood Dibbles, MD 03/06/18 1603  Pt had not been given her medications.  Spoke with nurse who will give medicines.  Pt will be given something to eat.   Linwood Dibbles, MD 03/06/18 1809  Pt is feeling better.  She had something to eat.  Ready for discharge.   CXR and UA negative   Linwood Dibbles, MD 03/06/18 1921

## 2018-03-06 NOTE — Telephone Encounter (Signed)
Pt called and stated that she is having right sided weakness. Patient has a history of strokes. Dr. Corliss Skains told the patient to come the ED immediately for evaluation. Patient agrees to come to [facility] ED and is on her way now for stroke like symptoms. She will need immediate evaluation. JM

## 2018-03-06 NOTE — ED Notes (Signed)
Bag lunch and OJ given per pt request; sitting upright on stretcher without complaint

## 2018-03-06 NOTE — Consult Note (Signed)
NEURO HOSPITALIST CONSULT NOTE   Requestig physician: Dr. Rubin Payor, Dr. Corliss Skains  Reason for Consult: right sided weakness  History obtained from:  Patient   Patricia [name]    Last known normal: 1 week ago Time Last known Well: unknown Modified Rankin:0 NIH:1  HPI:                                                                                                                                          Patricia [name] is an 52 y.o. female with PMH, DM2,  Multiple small left MCA stroke with minimal residual deficits, with a stent in "supraclinoid portion of ICH and proximal left M1", Migraines that can lead to right sided neurological deficits. Patient was sent here by her PCP for new right sided weakness given history of multiple strokes. Neurology was consulted prior to patient arrival in ed.   She has had a HA for about a week. 1 week. intiially she noticed that her handwriting was abnormal. This progressed to  Right side facial droop and weakness on right side.Today  She began dropping things with her right hand. She reports right side facial droop, denies trouble walking, denies any visual problems. Patient states that usually she has neurological issues when the HA persist for 2 weeks or more; however, these symptoms are very similar to the neurological symptoms that she would have at that point, in addition she has dysarthria. She has had "3 previous strokes" patient states that she has had right sided facial droop with right sided weakness (never dropping things) with her previous strokes; however patient states her last stroke was after they placed the stent with her symptoms occurring on the left side.  12-20-17 MRA showed: MRI of the brain and intracranial MRI angiography are unchanged since the study of 06/07/2017.  Old ischemic changes on the left related to watershed infarction. Multiple other old small infarctions are stable as well.  Left MCA stent. Signal  loss related to that stent. Distal vessels show flow in a similar pattern. 50% stenosis of the right M1 distally, unchanged. Nonvisualization of either anterior cerebral artery A1 segment.  Past Medical History:  Diagnosis Date  . Anemia    in the past  . Asthma    " a long time ago"  . Bursitis   . Depression    13 years ago -   . Diabetes mellitus without complication (HCC)    type  2  . GERD (gastroesophageal reflux disease)    occ. acid reflux  . Headache    migraines  . Hypertension   . Kidney stones   . PFO (patent foramen ovale)    hx of PFO with surgery done as an adult  . Rotator cuff tear    right  .  Stroke [facility]) 2009   lasdt stroke July 2015  . Syncope and collapse     Past Surgical History:  Procedure Laterality Date  . ABDOMINAL HYSTERECTOMY    . BREAST REDUCTION SURGERY    . CARDIAC CATHETERIZATION    . CARDIAC SURGERY  Closed hole in heart in 2009   Amplatzer Septal Occluder Ref [id number] (not listed in op notes, document scanned under MRI with written op notes  . HYSTEROTOMY    . IR GENERIC HISTORICAL  06/23/2016   IR ANGIO INTRA EXTRACRAN SEL COM CAROTID INNOMINATE BILAT MOD SED 06/23/2016 Julieanne Cotton, MD [facility]  . IR GENERIC HISTORICAL  06/23/2016   IR ANGIO VERTEBRAL SEL VERTEBRAL BILAT MOD SED 06/23/2016 Julieanne Cotton, MD [facility]  . IR GENERIC HISTORICAL  06/29/2016   IR RADIOLOGIST EVAL & MGMT 06/29/2016 [facility]  . IR GENERIC HISTORICAL  07/24/2016   IR INTRA CRAN STENT 07/24/2016 Julieanne Cotton, MD [facility]  . IR GENERIC HISTORICAL  07/28/2016   IR PTA INTRACRANIAL 07/28/2016 Julieanne Cotton, MD [facility]  . IR GENERIC HISTORICAL  07/28/2016   IR ENDOVASC INTRACRANIAL INF OTHER THAN THROMBO ART INC DIAG ANGIO 07/28/2016 Julieanne Cotton, MD [facility]  . IR GENERIC HISTORICAL  08/07/2016   IR RADIOLOGIST EVAL & MGMT 08/07/2016 [facility]  . IR GENERIC HISTORICAL  12/05/2016   IR ANGIO INTRA EXTRACRAN SEL COM  CAROTID INNOMINATE BILAT MOD SED 12/05/2016 Julieanne Cotton, MD [facility]  . IR GENERIC HISTORICAL  12/05/2016   IR ANGIO VERTEBRAL SEL SUBCLAVIAN INNOMINATE UNI R MOD SED 12/05/2016 Julieanne Cotton, MD [facility]  . IR GENERIC HISTORICAL  12/05/2016   IR ANGIO VERTEBRAL SEL VERTEBRAL UNI L MOD SED 12/05/2016 Julieanne Cotton, MD [facility]  . RADIOLOGY WITH ANESTHESIA [name] 07/24/2016   Procedure: Stenting;  Surgeon: Julieanne Cotton, MD;  Location: [facility] OR;  Service: Radiology;  Laterality: [name];  . RADIOLOGY WITH ANESTHESIA [name] 07/28/2016   Procedure: RADIOLOGY WITH ANESTHESIA;  Surgeon: Medication Radiologist, MD;  Location: [facility] OR;  Service: Radiology;  Laterality: [name];  . REDUCTION MAMMAPLASTY Bilateral   . SHOULDER ARTHROSCOPY WITH OPEN ROTATOR CUFF REPAIR AND DISTAL CLAVICLE ACROMINECTOMY Right 02/04/2015   Procedure: SHOULDER ARTHROSCOPY WITH SUBACROMIAL DECOMPRESSION AND OPEN ROTATOR CUFF REPAIR,;  Surgeon: Cammy Copa, MD;  Location: [facility] OR;  Service: Orthopedics;  Laterality: Right;  RIGHT SHOULDER DOA,SAD,MINI-OPEN ROTATOR CUFF TEAR REPAIR.    Family History  Problem Relation Age of Onset  . Hypertension Mother   . Hypertension Father   . Cancer Maternal Aunt   . Breast cancer Maternal Aunt   . Cancer Maternal Uncle          Social History:  reports that she has never smoked. She has never used smokeless tobacco. She reports that she drinks alcohol. She reports that she does not use drugs.  Allergies  Allergen Reactions  . Imitrex [Sumatriptan] Anaphylaxis  . Triptans Anaphylaxis and Hives  . Plavix [Clopidogrel Bisulfate] Hives  . Cymbalta [Duloxetine Hcl] Nausea Only  . Topamax [Topiramate]     Memory issues  . Lipitor [Atorvastatin] Rash    MEDICATIONS:  Current Facility-Administered Medications  Medication Dose Route Frequency Provider Last  Rate Last Dose  . iopamidol (ISOVUE-370) 76 % injection            Current Outpatient Medications  Medication Sig Dispense Refill  . aspirin EC 325 MG tablet Take 1 tablet (325 mg total) by mouth daily.    . chlorthalidone (HYGROTON) 25 MG tablet Take 25 mg by mouth at bedtime.   5  . Empagliflozin-Metformin HCl (SYNJARDY) 12.02-999 MG TABS Take 1 tablet by mouth 2 (two) times daily.     Dorise Hiss (AIMOVIG) 70 MG/ML SOAJ Inject 70 mg into the skin every 30 (thirty) days. 1 pen 12  . Insulin Glargine-Lixisenatide (SOLIQUA) 100-33 UNT-MCG/ML SOPN INJECT 32 UNITS UNDER THE SKIN DAILY in the evening AS NEEDED FOR BLOOD SUGAR    . Nebivolol HCl (BYSTOLIC) 20 MG TABS Take 10 mg by mouth every evening.     Marland Kitchen oxyCODONE-acetaminophen (PERCOCET) 10-325 MG tablet Take 1 tablet by mouth daily.     . pravastatin (PRAVACHOL) 40 MG tablet Take 40 mg by mouth at bedtime.    . pregabalin (LYRICA) 75 MG capsule Take 75 mg by mouth 2 (two) times daily.     . promethazine (PHENERGAN) 25 MG tablet Take 25 mg by mouth 3 (three) times daily as needed for nausea or vomiting.     [name] 100-33 UNT-MCG/ML SOPN INJECT 32 UNITS UNDER THE SKIN DAILY in the evening AS NEEDED FOR BLOOD SUGAR  6  . tiZANidine (ZANAFLEX) 4 MG tablet Take 4 mg by mouth every 8 (eight) hours as needed (migraines).         ROS:                                                                                                                                       History obtained from the patient  General ROS: negative for - chills, fatigue, fever, night sweats, weight gain or weight loss Psychological WUJ:WJXB not want to be here, stress r/t seeking custody of grandchild. Ophthalmic ROS: negative for - blurry vision, double vision, eye pain or loss of vision Endocrine ROS:DM2 Cardiovascular ROS: negative for - chest pain, dyspnea on exertion, edema or irregular heartbeat Musculoskeletal ROS: right sided weakness noted and dropping  things Neurological ROS: as noted in HPI Dermatological ROS: negative for rash and skin lesion changes   Blood pressure (!) 161/111, pulse 80, temperature 97.8 F (36.6 C), temperature source Oral, resp. rate 20, SpO2 100 %.   General Examination:  Physical Exam  HEENT-  Normocephalic, no lesions, without obvious abnormality.  Normal external eye and conjunctiva. Photophobic and phonophobic  Lungs-  Saturations within normal limits on RA Abdomen- All 4 quadrants palpated and nontender Extremities- Warm, dry and intact Musculoskeletal-no joint tenderness, deformity or swelling Skin-warm and dry, excessive dryness noted  Neurological Examination Mental Status: Alert, oriented, can identify watch,  Speech dysarthric without evidence of aphasia.  Able to follow 3 step commands without difficulty. Cranial Nerves: Photophobic and phonophobic,Visual fields grossly normal,  ptosis not present, extra-ocular motions intact in left eye. Right eye does cross midline but because of pain and will not completely deviate laterally. Bilaterally pupils equal, round, reactive to light. Smile asymmetric, with right side facial droop noted. facial light touch sensation normal bilaterally,hearing normal bilaterally, uvula rises symmetrically, bilateral shoulder shrug present, midline tongue extension Motor: Right : Upper extremity   5/5    Left:     Upper extremity   4+/5  Lower extremity   5/5     Lower extremity   4+/5 Tone and bulk:normal tone throughout; no atrophy noted Sensory:  light touch intact throughout, bilaterally Deep Tendon Reflexes: 2+ and symmetric throughout Plantars: Right: equivocal   Left: downgoing Cerebellar: normal finger-to-nose, did have trouble with heel to shin on right side secondary to weakness. Gait: normal gait and station ( walks cautiously)   Lab Results: Basic  Metabolic Panel: Recent Labs  Lab 03/06/18 1246  NA 142  K 4.2  CL 107  CO2 25  GLUCOSE 123*  BUN 22*  CREATININE 1.29*  CALCIUM 9.2    CBC: Recent Labs  Lab 03/06/18 1246  WBC 5.6  HGB 13.7  HCT 44.0  MCV 86.4  PLT 218    Cardiac Enzymes: No results for input(s): CKTOTAL, CKMB, CKMBINDEX, TROPONINI in the last 168 hours.  Lipid Panel: No results for input(s): CHOL, TRIG, HDL, CHOLHDL, VLDL, LDLCALC in the last 168 hours.  Imaging: No results found.   Valentina Lucks, NP-C [facility] [phone]   Attending addendum I have seen and examined the patient emergently in the ER after having been called by the endovascular service. Patient is a known patient of the endovascular service and stroke service was had prior left cerebral hemispheric watershed strokes with residual right-sided symptoms.  She also has a history of complex migraines with right facial droop. She reports an ongoing headache for 1 week with some right-sided symptoms. Please see the detailed examination above which I independently performed and have made corrections to. NIHSS-1.  Please see my assessment and plan below I have independently reviewed the CT, CT angiogram and the MRI brain.  All of those studies showed no acute changes.  No new strokes.  Assessment: 52 year old with old strokes, left MCA stent, with residual right-sided deficits, history of complex migraine involving the right side, presenting with right-sided weakness that started about 1 week ago along with a headache. Noncontrast CT of the head was done and reviewed by me-no acute changes CT Angie of the head and neck shows a patent stent with some stenosis which is stable from the prior CTA, with question of some branches of left M2 or M3 now being occluded compared to prior angios.  I obtained a stat MRI of the brain that did not reveal any acute stroke. I would attribute her current symptoms to recrudescence of old  symptoms versus complex migraine. I have discussed this case with IR specialist Dr. Corliss Skains, who will be seeing her in  outpatient consultation for a diagnostic cerebral angiogram  Impression: Complex migraine versus stroke recrudescence  Recommendations: -I would recommend treatment for complex migraine- IV Toradol Reglan Benadryl or the preferred cocktail per ED provider. -I would also check a CXR and UA as infections can sometimes exacerbate the old symptoms of stroke and produce recrudescence. -She can be discharged home after that. -She needs to follow-up with Dr. Corliss Skains whose office will call to schedule an appointment.  Victorino Dike from Dr. Fatima Sanger  Office is aware. -I discussed the case with Dr. Rubin Payor in the emergency room and Dr. Corliss Skains in person and relayed my plan.  Please call with questions.  -- Milon Dikes, MD [facility] Pager: [phone] If 7pm to 7am, please call on call as listed on AMION.

## 2018-03-06 NOTE — ED Provider Notes (Signed)
[facility] EMERGENCY DEPARTMENT Provider Note   CSN: [id number] Arrival date & time: 03/06/18  1235     History   Chief Complaint Chief Complaint  Patient presents with  . Fatigue  . Cerebrovascular Accident    HPI Patricia [name] is a 52 y.o. female.  HPI Patient presents with concern of  a stroke.  Has had previous stroke.  Has chronic difficulty with the right hand and over the last week has had more issues with it.  States she is been dropping things.  Has some speech slurring.  Previous stenting of her MCA.  Seen prior to my seeing her by neurology who had taken her to CT scan and MRI.  Has a history of chronic headaches also.  Usually does not have weakness with the headaches but there is thought of complicated migraine. Past Medical History:  Diagnosis Date  . Anemia    in the past  . Asthma    " a long time ago"  . Bursitis   . Depression    13 years ago -   . Diabetes mellitus without complication (HCC)    type  2  . GERD (gastroesophageal reflux disease)    occ. acid reflux  . Headache    migraines  . Hypertension   . Kidney stones   . PFO (patent foramen ovale)    hx of PFO with surgery done as an adult  . Rotator cuff tear    right  . Stroke [facility]) 2009   lasdt stroke July 2015  . Syncope and collapse     Patient Active Problem List   Diagnosis Date Noted  . Chronic daily headache 06/19/2017  . Intractable chronic migraine without aura and with status migrainosus 09/15/2016  . Depression 08/23/2016  . Cerebrovascular accident (CVA) due to occlusion of left middle cerebral artery (HCC) 07/28/2016  . Acute respiratory failure (HCC)   . Middle cerebral artery stenosis, left 07/24/2016  . Headache 03/23/2016  . Type 2 diabetes mellitus with complication, with long-term current use of insulin (HCC) 09/08/2015  . Hyperglycemia   . TIA (transient ischemic attack)   . AKI (acute kidney injury) (HCC) 02/20/2015  . ARF (acute  renal failure) (HCC) 02/20/2015  . Rotator cuff tear 02/04/2015  . Occipital neuralgia 08/27/2014  . Type 2 diabetes mellitus with complication (HCC) 08/27/2014  . PFO (patent foramen ovale) 06/17/2014  . Hyperlipidemia 06/17/2014  . Morbid obesity, unspecified obesity type (HCC) 06/17/2014  . Acute CVA (cerebrovascular accident) (HCC) 05/22/2014  . Non-compliance 05/22/2014  . History of cardioembolic stroke 05/22/2014  . Status post device closure of ASD 05/22/2014  . HTN (hypertension), benign 05/22/2014  . DM type 2 (diabetes mellitus, type 2) (HCC) 05/22/2014    Past Surgical History:  Procedure Laterality Date  . ABDOMINAL HYSTERECTOMY    . BREAST REDUCTION SURGERY    . CARDIAC CATHETERIZATION    . CARDIAC SURGERY  Closed hole in heart in 2009   Amplatzer Septal Occluder Ref [id number] (not listed in op notes, document scanned under MRI with written op notes  . HYSTEROTOMY    . IR GENERIC HISTORICAL  06/23/2016   IR ANGIO INTRA EXTRACRAN SEL COM CAROTID INNOMINATE BILAT MOD SED 06/23/2016 Julieanne Cotton, MD [facility]  . IR GENERIC HISTORICAL  06/23/2016   IR ANGIO VERTEBRAL SEL VERTEBRAL BILAT MOD SED 06/23/2016 Julieanne Cotton, MD [facility]  . IR GENERIC HISTORICAL  06/29/2016   IR RADIOLOGIST EVAL & MGMT 06/29/2016  [facility]  . IR GENERIC HISTORICAL  07/24/2016   IR INTRA CRAN STENT 07/24/2016 Julieanne Cotton, MD [facility]  . IR GENERIC HISTORICAL  07/28/2016   IR PTA INTRACRANIAL 07/28/2016 Julieanne Cotton, MD [facility]  . IR GENERIC HISTORICAL  07/28/2016   IR ENDOVASC INTRACRANIAL INF OTHER THAN THROMBO ART INC DIAG ANGIO 07/28/2016 Julieanne Cotton, MD [facility]  . IR GENERIC HISTORICAL  08/07/2016   IR RADIOLOGIST EVAL & MGMT 08/07/2016 [facility]  . IR GENERIC HISTORICAL  12/05/2016   IR ANGIO INTRA EXTRACRAN SEL COM CAROTID INNOMINATE BILAT MOD SED 12/05/2016 Julieanne Cotton, MD [facility]  . IR GENERIC HISTORICAL  12/05/2016   IR ANGIO  VERTEBRAL SEL SUBCLAVIAN INNOMINATE UNI R MOD SED 12/05/2016 Julieanne Cotton, MD [facility]  . IR GENERIC HISTORICAL  12/05/2016   IR ANGIO VERTEBRAL SEL VERTEBRAL UNI L MOD SED 12/05/2016 Julieanne Cotton, MD [facility]  . RADIOLOGY WITH ANESTHESIA [name] 07/24/2016   Procedure: Stenting;  Surgeon: Julieanne Cotton, MD;  Location: [facility] OR;  Service: Radiology;  Laterality: [name];  . RADIOLOGY WITH ANESTHESIA [name] 07/28/2016   Procedure: RADIOLOGY WITH ANESTHESIA;  Surgeon: Medication Radiologist, MD;  Location: [facility] OR;  Service: Radiology;  Laterality: [name];  . REDUCTION MAMMAPLASTY Bilateral   . SHOULDER ARTHROSCOPY WITH OPEN ROTATOR CUFF REPAIR AND DISTAL CLAVICLE ACROMINECTOMY Right 02/04/2015   Procedure: SHOULDER ARTHROSCOPY WITH SUBACROMIAL DECOMPRESSION AND OPEN ROTATOR CUFF REPAIR,;  Surgeon: Cammy Copa, MD;  Location: [facility] OR;  Service: Orthopedics;  Laterality: Right;  RIGHT SHOULDER DOA,SAD,MINI-OPEN ROTATOR CUFF TEAR REPAIR.     OB History   None      Home Medications    Prior to Admission medications   Medication Sig Start Date End Date Taking? Authorizing Provider  aspirin EC 325 MG tablet Take 1 tablet (325 mg total) by mouth daily. 12/25/17   Marvel Plan, MD  chlorthalidone (HYGROTON) 25 MG tablet Take 25 mg by mouth at bedtime.  09/03/15   [provider]  Empagliflozin-Metformin HCl (SYNJARDY) 12.02-999 MG TABS Take 1 tablet by mouth 2 (two) times daily.     [provider]  Erenumab-aooe (AIMOVIG) 70 MG/ML SOAJ Inject 70 mg into the skin every 30 (thirty) days. 08/01/17   Anson Fret, MD  Insulin Glargine-Lixisenatide (SOLIQUA) 100-33 UNT-MCG/ML SOPN INJECT 32 UNITS UNDER THE SKIN DAILY in the evening AS NEEDED FOR BLOOD SUGAR 02/25/16   [provider]  Nebivolol HCl (BYSTOLIC) 20 MG TABS Take 10 mg by mouth every evening.     [provider]  oxyCODONE-acetaminophen (PERCOCET) 10-325 MG tablet Take 1 tablet by mouth daily.      [provider]  pravastatin (PRAVACHOL) 40 MG tablet Take 40 mg by mouth at bedtime.    [provider]  pregabalin (LYRICA) 75 MG capsule Take 75 mg by mouth 2 (two) times daily.  11/08/17 11/08/18  [provider]  promethazine (PHENERGAN) 25 MG tablet Take 25 mg by mouth 3 (three) times daily as needed for nausea or vomiting.     [provider]  SOLIQUA 100-33 UNT-MCG/ML SOPN INJECT 32 UNITS UNDER THE SKIN DAILY in the evening AS NEEDED FOR BLOOD SUGAR 02/25/16   [provider]  tiZANidine (ZANAFLEX) 4 MG tablet Take 4 mg by mouth every 8 (eight) hours as needed (migraines).     [provider]    Family History Family History  Problem Relation Age of Onset  . Hypertension Mother   .  Hypertension Father   . Cancer Maternal Aunt   . Breast cancer Maternal Aunt   . Cancer Maternal Uncle     Social History Social History   Tobacco Use  . Smoking status: Never Smoker  . Smokeless tobacco: Never Used  Substance Use Topics  . Alcohol use: Yes    Alcohol/week: 0.0 oz    Comment: occasional  . Drug use: No     Allergies   Imitrex [sumatriptan]; Triptans; Plavix [clopidogrel bisulfate]; Cymbalta [duloxetine hcl]; Iodinated diagnostic agents; Shellfish allergy; Topamax [topiramate]; and Lipitor [atorvastatin]   Review of Systems Review of Systems  Constitutional: Negative for appetite change.  HENT: Negative for congestion.   Respiratory: Negative for shortness of breath.   Cardiovascular: Negative for chest pain.  Gastrointestinal: Negative for abdominal pain.  Musculoskeletal: Negative for gait problem.  Skin: Negative for pallor.  Neurological: Positive for speech difficulty, weakness and headaches.  Psychiatric/Behavioral: Negative for confusion.     Physical Exam Updated Vital Signs BP (!) 161/111 (BP Location: Left Arm)   Pulse 80   Temp 97.8 F (36.6 C) (Oral)   Resp 20   SpO2 100%   Physical Exam    Constitutional: She appears well-developed.  HENT:  Head: Atraumatic.  Eyes: EOM are normal.  Neck: Neck supple.  Pulmonary/Chest: Effort normal.  Abdominal: There is no tenderness.  Musculoskeletal: She exhibits no edema.  Neurological: She is alert.  Chronic right-sided weakness.  Chronic facial droop.  Complete NIH scoring done by neurology.  Skin: Capillary refill takes less than 2 seconds.     ED Treatments / Results  Labs (all labs ordered are listed, but only abnormal results are displayed) Labs Reviewed  COMPREHENSIVE METABOLIC PANEL - Abnormal; Notable for the following components:      Result Value   Glucose, Bld 123 (*)    BUN 22 (*)    Creatinine, Ser 1.29 (*)    Alkaline Phosphatase 143 (*)    GFR calc non Af Amer 47 (*)    GFR calc Af Amer 55 (*)    All other components within normal limits  I-STAT BETA HCG BLOOD, ED ([facility], [facility], [facility] ONLY) - Abnormal; Notable for the following components:   I-stat hCG, quantitative 5.8 (*)    All other components within normal limits  CBC  URINALYSIS, ROUTINE W REFLEX MICROSCOPIC  COMPREHENSIVE METABOLIC PANEL  CBG MONITORING, ED    EKG None  Radiology Ct Angio Head W Or Wo Contrast  Result Date: 03/06/2018 CLINICAL DATA:  Previous left MCA infarction with stent assisted angioplasty. Acute presentation with right-sided weakness and slurred speech beginning yesterday. EXAM: CT ANGIOGRAPHY HEAD AND NECK TECHNIQUE: Multidetector CT imaging of the head and neck was performed using the standard protocol during bolus administration of intravenous contrast. Multiplanar CT image reconstructions and MIPs were obtained to evaluate the vascular anatomy. Carotid stenosis measurements (when applicable) are obtained utilizing NASCET criteria, using the distal internal carotid diameter as the denominator. CONTRAST:  50mL ISOVUE-370 IOPAMIDOL (ISOVUE-370) INJECTION 76% COMPARISON:  MRI 12/20/2017. multiple previous multi modality exams. FINDINGS:  CT HEAD FINDINGS Brain: Old bilateral watershed infarctions appear the same. No sign of acute infarction, mass lesion, hemorrhage, hydrocephalus or extra-axial collection. Vascular: Left MCA stent. Skull: Negative Sinuses: Clear Orbits: Normal Review of the MIP images confirms the above findings CTA NECK FINDINGS Aortic arch: The aorta appears normal. Branching pattern of the brachiocephalic vessels from the arch is normal. Right carotid system: Common carotid artery widely patent to the  bifurcation. Carotid bifurcation is normal without soft or calcified plaque. Cervical ICA is normal. Left carotid system: Common carotid artery widely patent to the bifurcation. Carotid bifurcation is normal without soft or calcified plaque. Cervical ICA is normal. Vertebral arteries: Small vertebral arteries. No origin stenosis. Left vertebral to takes an early origin from the subclavian. No stenosis in the cervical region. Skeleton: Ordinary cervical spondylosis. Other neck: No mass or lymphadenopathy. Upper chest: Negative Review of the MIP images confirms the above findings CTA HEAD FINDINGS Anterior circulation: Internal carotid arteries are patent through the skull base and siphon regions. There is ordinary atherosclerotic calcification in the carotid siphon regions without stenosis greater than 30%. On the right, redemonstrated is stenosis of the distal M1 segment, approximately 50%, similar to the previous MR study. Question some missing branches in the right frontal region. This is not a definite finding but appears to be different when compared to previous CTA of 07/28/2016. Tiny diseased anterior cerebral artery on the right. No definite A1 segment on the left. Minimal flow seen and small anterior cerebral vessels. Left MCA stent is patent without definable stenosis. One could question of filling defect at the midportion of the stent, but this appearance is similar to the previous CT angiogram of October 2017. Posterior  circulation: Vertebral arteries are patent at the foramen magnum. Left vertebral artery supplies PICA and gives a small contribution to the basilar. Right vertebral artery supplies the basilar. Basilar artery is small but without focal severe stenosis. Fetal origin of both posterior cerebral arteries. Superior cerebellar artery flow on each side. Venous sinuses: Patent and normal. Anatomic variants: None significant. Delayed phase: No abnormal enhancement. Review of the MIP images confirms the above findings IMPRESSION: Question some missing MCA branch vessels in the left parietal region and possibly the right frontal region when compared to a previous study of 2017. This could represent M2/M3 occlusion on both sides. Higher suspicion on the left. I do not identify a retrievable embolus. Other findings appear unchanged from multiple previous examinations. Left MCA stent with an apparent small filling defect in the central portion, but with intact distal flow. 50% stenosis of the distal right M1 region. Severely diseased bilateral anterior cerebral arteries. See above for full discussion. These results were communicated to [place] at 2:07 pmon 5/15/2019by text page via the [facility] messaging system. Electronically Signed   By: Paulina Fusi M.D.   On: 03/06/2018 14:13   Ct Angio Neck W Or Wo Contrast  Result Date: 03/06/2018 CLINICAL DATA:  Previous left MCA infarction with stent assisted angioplasty. Acute presentation with right-sided weakness and slurred speech beginning yesterday. EXAM: CT ANGIOGRAPHY HEAD AND NECK TECHNIQUE: Multidetector CT imaging of the head and neck was performed using the standard protocol during bolus administration of intravenous contrast. Multiplanar CT image reconstructions and MIPs were obtained to evaluate the vascular anatomy. Carotid stenosis measurements (when applicable) are obtained utilizing NASCET criteria, using the distal internal carotid diameter as the denominator. CONTRAST:   50mL ISOVUE-370 IOPAMIDOL (ISOVUE-370) INJECTION 76% COMPARISON:  MRI 12/20/2017. multiple previous multi modality exams. FINDINGS: CT HEAD FINDINGS Brain: Old bilateral watershed infarctions appear the same. No sign of acute infarction, mass lesion, hemorrhage, hydrocephalus or extra-axial collection. Vascular: Left MCA stent. Skull: Negative Sinuses: Clear Orbits: Normal Review of the MIP images confirms the above findings CTA NECK FINDINGS Aortic arch: The aorta appears normal. Branching pattern of the brachiocephalic vessels from the arch is normal. Right carotid system: Common carotid artery widely patent to the  bifurcation. Carotid bifurcation is normal without soft or calcified plaque. Cervical ICA is normal. Left carotid system: Common carotid artery widely patent to the bifurcation. Carotid bifurcation is normal without soft or calcified plaque. Cervical ICA is normal. Vertebral arteries: Small vertebral arteries. No origin stenosis. Left vertebral to takes an early origin from the subclavian. No stenosis in the cervical region. Skeleton: Ordinary cervical spondylosis. Other neck: No mass or lymphadenopathy. Upper chest: Negative Review of the MIP images confirms the above findings CTA HEAD FINDINGS Anterior circulation: Internal carotid arteries are patent through the skull base and siphon regions. There is ordinary atherosclerotic calcification in the carotid siphon regions without stenosis greater than 30%. On the right, redemonstrated is stenosis of the distal M1 segment, approximately 50%, similar to the previous MR study. Question some missing branches in the right frontal region. This is not a definite finding but appears to be different when compared to previous CTA of 07/28/2016. Tiny diseased anterior cerebral artery on the right. No definite A1 segment on the left. Minimal flow seen and small anterior cerebral vessels. Left MCA stent is patent without definable stenosis. One could question of  filling defect at the midportion of the stent, but this appearance is similar to the previous CT angiogram of October 2017. Posterior circulation: Vertebral arteries are patent at the foramen magnum. Left vertebral artery supplies PICA and gives a small contribution to the basilar. Right vertebral artery supplies the basilar. Basilar artery is small but without focal severe stenosis. Fetal origin of both posterior cerebral arteries. Superior cerebellar artery flow on each side. Venous sinuses: Patent and normal. Anatomic variants: None significant. Delayed phase: No abnormal enhancement. Review of the MIP images confirms the above findings IMPRESSION: Question some missing MCA branch vessels in the left parietal region and possibly the right frontal region when compared to a previous study of 2017. This could represent M2/M3 occlusion on both sides. Higher suspicion on the left. I do not identify a retrievable embolus. Other findings appear unchanged from multiple previous examinations. Left MCA stent with an apparent small filling defect in the central portion, but with intact distal flow. 50% stenosis of the distal right M1 region. Severely diseased bilateral anterior cerebral arteries. See above for full discussion. These results were communicated to [place] at 2:07 pmon 5/15/2019by text page via the [facility] messaging system. Electronically Signed   By: Paulina Fusi M.D.   On: 03/06/2018 14:13   Mr Brain Wo Contrast  Result Date: 03/06/2018 CLINICAL DATA:  52 year old female with right side weakness and slurred speech beginning yesterday. Prior left MCA infarct with left MCA stent assisted angioplasty, and 2017 revascularization of severe symptomatic intra stent stenosis. EXAM: MRI HEAD WITHOUT CONTRAST TECHNIQUE: Multiplanar, multiecho pulse sequences of the brain and surrounding structures were obtained without intravenous contrast. COMPARISON:  CTA head and neck 1329 hours today. Brain MRI 12/20/2017. CTA head  and CT perfusion 07/28/2016. FINDINGS: Brain: No restricted diffusion or evidence of acute infarction. Stable scattered areas of chronic cortical and subcortical encephalomalacia in the superior frontal gyri, more extensive on the left where conspicuous subcortical cystic encephalomalacia is again noted (series [id number], image 17 today). Stable chronic encephalomalacia at the genu of the corpus callosum, and less pronounced periventricular white matter signal abnormality elsewhere. Small chronic lacunar infarcts in the bilateral caudate and left thalamus are stable. Small punctate and linear areas of chronic hemorrhage are redemonstrated in the left middle frontal gyrus on susceptibility weighted imaging (series [id number], image 35). Some of the  same areas were evident on prior 3 Tesla susceptibility weighted imaging in 2017, but have not been evident on the T2* imaging performed on the more recent MRIs. No other areas of hemosiderin. No new signal abnormality abnormality identified. Vascular: Major intracranial vascular flow voids are stable compared to February. A left MCA M1 vascular stent is redemonstrated on susceptibility weighted imaging. Skull and upper cervical spine: Negative visible cervical spine. Normal bone marrow signal. Sinuses/Orbits: Stable and negative. Other: Visible internal auditory structures appear normal. Negative scalp and face soft tissues. IMPRESSION: 1.  No acute intracranial abnormality. 2. Stable MRI appearance of the brain. Chronic ischemic disease primarily in the MCA and ACA territories and watershed areas. Electronically Signed   By: Odessa Fleming M.D.   On: 03/06/2018 14:39    Procedures Procedures (including critical care time)  Medications Ordered in ED Medications  iopamidol (ISOVUE-370) 76 % injection (has no administration in time range)  ketorolac (TORADOL) 30 MG/ML injection 30 mg (has no administration in time range)  promethazine (PHENERGAN) injection 12.5 mg (has no  administration in time range)  iopamidol (ISOVUE-370) 76 % injection 50 mL (50 mLs Intravenous Contrast Given 03/06/18 1334)     Initial Impression / Assessment and Plan / ED Course  I have reviewed the triage vital signs and the nursing notes.  Pertinent labs & imaging results that were available during my care of the patient were reviewed by me and considered in my medical decision making (see chart for details).     Patient with headache and right-sided weakness.  Does not appear to be a stroke on CT or MRI.  Seen by neurology.  Potentially complicated migraine.  Treated for the migraine.  Urinalysis and chest x-ray still pending.  Care will be turned over to Dr. Lynelle Doctor  Final Clinical Impressions(s) / ED Diagnoses   Final diagnoses:  None    ED Discharge Orders    None       Benjiman Core, MD 03/06/18 1544

## 2018-03-06 NOTE — ED Notes (Signed)
CBG 110 mg/dl

## 2018-03-06 NOTE — ED Notes (Signed)
Neurology in to assess pt; states MRI is ready to take pt; pt undressed and wheeled to MRI in wheelchair per Neurologist and Neuro PA

## 2018-03-06 NOTE — ED Triage Notes (Signed)
Pt arrives to hospital stating her head hurts,  Rt side waekness and speech slurring since yesterday when she woke , states started to drop things more this am she states has hx of strokes  X 2 ,  States when she gets tired her rt face will droops, pt aaox4 VAN is neg

## 2018-03-06 NOTE — Discharge Instructions (Addendum)
Continue your current medications, follow-up with your primary care doctor and your neurologist.  Return as needed for worsening symptoms.

## 2018-03-13 ENCOUNTER — Encounter: Payer: Self-pay | Admitting: Neurology

## 2018-03-13 ENCOUNTER — Telehealth (HOSPITAL_COMMUNITY): Payer: Self-pay

## 2018-03-13 NOTE — Telephone Encounter (Signed)
Called to schedule angio, pt will call back to schedule. AW 

## 2018-03-14 ENCOUNTER — Other Ambulatory Visit (HOSPITAL_COMMUNITY): Payer: Self-pay | Admitting: Interventional Radiology

## 2018-03-14 DIAGNOSIS — R531 Weakness: Secondary | ICD-10-CM

## 2018-03-14 DIAGNOSIS — I639 Cerebral infarction, unspecified: Secondary | ICD-10-CM

## 2018-03-14 DIAGNOSIS — I771 Stricture of artery: Secondary | ICD-10-CM

## 2018-04-04 ENCOUNTER — Ambulatory Visit (HOSPITAL_COMMUNITY): Payer: BLUE CROSS/BLUE SHIELD

## 2018-04-08 ENCOUNTER — Other Ambulatory Visit: Payer: Self-pay | Admitting: Radiology

## 2018-04-09 ENCOUNTER — Encounter (HOSPITAL_COMMUNITY): Payer: Self-pay

## 2018-04-09 ENCOUNTER — Ambulatory Visit (HOSPITAL_COMMUNITY)
Admission: RE | Admit: 2018-04-09 | Discharge: 2018-04-09 | Disposition: A | Discharge status: Home / Self Care | Payer: BLUE CROSS/BLUE SHIELD | Source: Ambulatory Visit | Attending: Interventional Radiology | Admitting: Interventional Radiology

## 2018-04-09 DIAGNOSIS — K219 Gastro-esophageal reflux disease without esophagitis: Secondary | ICD-10-CM | POA: Diagnosis not present

## 2018-04-09 DIAGNOSIS — Z794 Long term (current) use of insulin: Secondary | ICD-10-CM | POA: Diagnosis not present

## 2018-04-09 DIAGNOSIS — Z7982 Long term (current) use of aspirin: Secondary | ICD-10-CM | POA: Diagnosis not present

## 2018-04-09 DIAGNOSIS — I6603 Occlusion and stenosis of bilateral middle cerebral arteries: Secondary | ICD-10-CM | POA: Diagnosis not present

## 2018-04-09 DIAGNOSIS — Z8249 Family history of ischemic heart disease and other diseases of the circulatory system: Secondary | ICD-10-CM | POA: Insufficient documentation

## 2018-04-09 DIAGNOSIS — I10 Essential (primary) hypertension: Secondary | ICD-10-CM | POA: Insufficient documentation

## 2018-04-09 DIAGNOSIS — Z8673 Personal history of transient ischemic attack (TIA), and cerebral infarction without residual deficits: Secondary | ICD-10-CM | POA: Diagnosis not present

## 2018-04-09 DIAGNOSIS — R200 Anesthesia of skin: Secondary | ICD-10-CM | POA: Diagnosis not present

## 2018-04-09 DIAGNOSIS — R4701 Aphasia: Secondary | ICD-10-CM | POA: Insufficient documentation

## 2018-04-09 DIAGNOSIS — G43909 Migraine, unspecified, not intractable, without status migrainosus: Secondary | ICD-10-CM | POA: Insufficient documentation

## 2018-04-09 DIAGNOSIS — E119 Type 2 diabetes mellitus without complications: Secondary | ICD-10-CM | POA: Diagnosis not present

## 2018-04-09 DIAGNOSIS — I639 Cerebral infarction, unspecified: Secondary | ICD-10-CM

## 2018-04-09 DIAGNOSIS — R531 Weakness: Secondary | ICD-10-CM

## 2018-04-09 DIAGNOSIS — J45909 Unspecified asthma, uncomplicated: Secondary | ICD-10-CM | POA: Insufficient documentation

## 2018-04-09 DIAGNOSIS — I771 Stricture of artery: Secondary | ICD-10-CM

## 2018-04-09 DIAGNOSIS — F329 Major depressive disorder, single episode, unspecified: Secondary | ICD-10-CM | POA: Insufficient documentation

## 2018-04-09 HISTORY — PX: IR ANGIO INTRA EXTRACRAN SEL COM CAROTID INNOMINATE BILAT MOD SED: IMG5360

## 2018-04-09 HISTORY — PX: IR ANGIO VERTEBRAL SEL SUBCLAVIAN INNOMINATE UNI R MOD SED: IMG5365

## 2018-04-09 LAB — PROTIME-INR
INR: 0.86
PROTHROMBIN TIME: 11.6 s (ref 11.4–15.2)

## 2018-04-09 LAB — BASIC METABOLIC PANEL
ANION GAP: 7 (ref 5–15)
BUN: 19 mg/dL (ref 6–20)
CALCIUM: 9.5 mg/dL (ref 8.9–10.3)
CO2: 29 mmol/L (ref 22–32)
CREATININE: 1.27 mg/dL — AB (ref 0.44–1.00)
Chloride: 106 mmol/L (ref 101–111)
GFR, EST AFRICAN AMERICAN: 56 mL/min — AB (ref >60)
GFR, EST NON AFRICAN AMERICAN: 48 mL/min — AB (ref >60)
Glucose, Bld: 120 mg/dL — ABNORMAL HIGH (ref 65–99)
Potassium: 4 mmol/L (ref 3.5–5.1)
SODIUM: 142 mmol/L (ref 135–145)

## 2018-04-09 LAB — CBC
HCT: 44.8 % (ref 36.0–46.0)
HEMOGLOBIN: 13.8 g/dL (ref 12.0–15.0)
MCH: 27 pg (ref 26.0–34.0)
MCHC: 30.8 g/dL (ref 30.0–36.0)
MCV: 87.7 fL (ref 78.0–100.0)
PLATELETS: 205 10*3/uL (ref 150–400)
RBC: 5.11 MIL/uL (ref 3.87–5.11)
RDW: 13.9 % (ref 11.5–15.5)
WBC: 7 10*3/uL (ref 4.0–10.5)

## 2018-04-09 MED ORDER — FENTANYL CITRATE (PF) 100 MCG/2ML IJ SOLN
INTRAMUSCULAR | Status: AC | PRN
  Administered 2018-04-09 (×2): 25 ug via INTRAVENOUS

## 2018-04-09 MED ORDER — MIDAZOLAM HCL 2 MG/2ML IJ SOLN
INTRAMUSCULAR | Status: AC
  Filled 2018-04-09: qty 2

## 2018-04-09 MED ORDER — HEPARIN SODIUM (PORCINE) 1000 UNIT/ML IJ SOLN
INTRAMUSCULAR | Status: AC | PRN
  Administered 2018-04-09: 1000 [IU] via INTRAVENOUS

## 2018-04-09 MED ORDER — LIDOCAINE HCL (PF) 1 % IJ SOLN
INTRAMUSCULAR | Status: AC | PRN
  Administered 2018-04-09: 15 mL

## 2018-04-09 MED ORDER — HEPARIN SODIUM (PORCINE) 5000 UNIT/ML IJ SOLN
INTRAMUSCULAR | Status: AC
  Filled 2018-04-09: qty 1

## 2018-04-09 MED ORDER — MIDAZOLAM HCL 2 MG/2ML IJ SOLN
INTRAMUSCULAR | Status: AC | PRN
  Administered 2018-04-09: 1 mg via INTRAVENOUS

## 2018-04-09 MED ORDER — SODIUM CHLORIDE 0.9 % IV SOLN
INTRAVENOUS | Status: AC
  Administered 2018-04-09: 12:00:00 via INTRAVENOUS

## 2018-04-09 MED ORDER — FENTANYL CITRATE (PF) 100 MCG/2ML IJ SOLN
INTRAMUSCULAR | Status: AC
  Filled 2018-04-09: qty 2

## 2018-04-09 MED ORDER — SODIUM CHLORIDE 0.9 % IV SOLN
Freq: Once | INTRAVENOUS | Status: AC
  Administered 2018-04-09: 08:00:00 via INTRAVENOUS

## 2018-04-09 MED ORDER — IOHEXOL 300 MG/ML  SOLN: 50.0000 mL | Freq: Once | INTRAMUSCULAR | Status: DC | PRN

## 2018-04-09 MED ORDER — LIDOCAINE HCL 1 % IJ SOLN
INTRAMUSCULAR | Status: AC
  Filled 2018-04-09: qty 20

## 2018-04-09 NOTE — Procedures (Signed)
S/P bilateral common carotid artery angiograms. RT CFA approach. Findings. 1.Approx 75 to 80 % stenosis of LT MCA intrastent seg

## 2018-04-09 NOTE — Discharge Instructions (Signed)
**Note Gigi Onstad-identified via Obfuscation** Cerebral Angiogram, Care After °Refer to this sheet in the next few weeks. These instructions provide you with information on caring for yourself after your procedure. Your health care provider may also give you more specific instructions. Your treatment has been planned according to current medical practices, but problems sometimes occur. Call your health care provider if you have any problems or questions after your procedure. °What can I expect after the procedure? °After your procedure, it is typical to have the following: °· Bruising at the catheter insertion site that usually fades within 1-2 weeks. °· Blood collecting in the tissue (hematoma) that may be painful to the touch. It should usually decrease in size and tenderness within 1-2 weeks. °· A mild headache. ° °Follow these instructions at home: °· Take medicines only as directed by your health care provider. °· You may shower 24-48 hours after the procedure or as directed by your health care provider. Remove the bandage (dressing) and gently wash the site with plain soap and water. Pat the area dry with a clean towel. Do not rub the site, because this may cause bleeding. °· Do not take baths, swim, or use a hot tub until your health care provider approves. °· Check your insertion site every day for redness, swelling, or drainage. °· Do not apply powder or lotion to the site. °· Do not lift over 10 lb (4.5 kg) for 5 days after your procedure or as directed by your health care provider. °· Ask your health care provider when it is okay to: °? Return to work or school. °? Resume usual physical activities or sports. °? Resume sexual activity. °· Do not drive home if you are discharged the same day as the procedure. Have someone else drive you. °· You may drive 24 hours after the procedure unless otherwise instructed by your health care provider. °· Do not operate machinery or power tools for 24 hours after the procedure or as directed by your health care  provider. °· If your procedure was done as an outpatient procedure, which means that you went home the same day as your procedure, a responsible adult should be with you for the first 24 hours after you arrive home. °· Keep all follow-up visits as directed by your health care provider. This is important. °Contact a health care provider if: °· You have a fever. °· You have chills. °· You have increased bleeding from the catheter insertion site. Hold pressure on the site. °Get help right away if: °· You have vision changes or loss of vision. °· You have numbness or weakness on one side of your body. °· You have difficulty talking, or you have slurred speech or cannot speak (aphasia). °· You feel confused or have difficulty remembering. °· You have unusual pain at the catheter insertion site. °· You have redness, warmth, or swelling at the catheter insertion site. °· You have drainage (other than a small amount of blood on the dressing) from the catheter insertion site. °· The catheter insertion site is bleeding, and the bleeding does not stop after 30 minutes of holding steady pressure on the site. °These symptoms may represent a serious problem that is an emergency. Do not wait to see if the symptoms will go away. Get medical help right away. Call your local emergency services (911 in [place] Do not drive yourself to the hospital. °This information is not intended to replace advice given to you by your health care provider. Make sure you discuss any questions  **Note Melynda Krzywicki-identified via Obfuscation** you have with your health care provider. °Document Released: 02/23/2014 Document Revised: 03/16/2016 Document Reviewed: 10/22/2013 °Elsevier Interactive Patient Education © 2017 [employer] ° °

## 2018-04-09 NOTE — H&P (Signed)
Chief Complaint: Patient was seen in consultation today for cerebral arteriogram    Supervising Physician: Julieanne Cotton  Patient Status: [facility] - Out-pt  History of Present Illness: Patricia [name] is a 52 y.o. female   Known to NIR Previous L middle cerebral artery angioplasty/stent 07/2016 Persistent Rt headache off and on Recent complaint of right weakness and slurred speech  MR 03/06/18:  IMPRESSION: 1.  No acute intracranial abnormality. 2. Stable MRI appearance of the brain. Chronic ischemic disease primarily in the MCA and ACA territories and watershed areas  CTA 03/06/18:  IMPRESSION: Question some missing MCA branch vessels in the left parietal region and possibly the right frontal region when compared to a previous study of 2017. This could represent M2/M3 occlusion on both sides. Higher suspicion on the left. I do not identify a retrievable embolus. Other findings appear unchanged from multiple previous examinations. Left MCA stent with an apparent small filling defect in the central portion, but with intact distal flow. 50% stenosis of the distal right M1 region. Severely diseased bilateral anterior cerebral arteries. See above for full discussion.  Scheduled now for cerebral arteriogram for recheck   Past Medical History:  Diagnosis Date  . Anemia    in the past  . Asthma    " a long time ago"  . Bursitis   . Depression    13 years ago -   . Diabetes mellitus without complication (HCC)    type  2  . GERD (gastroesophageal reflux disease)    occ. acid reflux  . Headache    migraines  . Hypertension   . Kidney stones   . PFO (patent foramen ovale)    hx of PFO with surgery done as an adult  . Rotator cuff tear    right  . Stroke [facility]) 2009   lasdt stroke July 2015  . Syncope and collapse     Past Surgical History:  Procedure Laterality Date  . ABDOMINAL HYSTERECTOMY    . BREAST REDUCTION SURGERY    . CARDIAC CATHETERIZATION      . CARDIAC SURGERY  Closed hole in heart in 2009   Amplatzer Septal Occluder Ref [id number] (not listed in op notes, document scanned under MRI with written op notes  . HYSTEROTOMY    . IR GENERIC HISTORICAL  06/23/2016   IR ANGIO INTRA EXTRACRAN SEL COM CAROTID INNOMINATE BILAT MOD SED 06/23/2016 Julieanne Cotton, MD [facility]INTER[facility] RAD  . IR GENERIC HISTORICAL  06/23/2016   IR ANGIO VERTEBRAL SEL VERTEBRAL BILAT MOD SED 06/23/2016 Julieanne Cotton, MD [facility]  . IR GENERIC HISTORICAL  06/29/2016   IR RADIOLOGIST EVAL & MGMT 06/29/2016 [facility]  . IR GENERIC HISTORICAL  07/24/2016   IR INTRA CRAN STENT 07/24/2016 Julieanne Cotton, MD [facility]  . IR GENERIC HISTORICAL  07/28/2016   IR PTA INTRACRANIAL 07/28/2016 Julieanne Cotton, MD [facility]  . IR GENERIC HISTORICAL  07/28/2016   IR ENDOVASC INTRACRANIAL INF OTHER THAN THROMBO ART INC DIAG ANGIO 07/28/2016 Julieanne Cotton, MD [facility]  . IR GENERIC HISTORICAL  08/07/2016   IR RADIOLOGIST EVAL & MGMT 08/07/2016 [facility]  . IR GENERIC HISTORICAL  12/05/2016   IR ANGIO INTRA EXTRACRAN SEL COM CAROTID INNOMINATE BILAT MOD SED 12/05/2016 Julieanne Cotton, MD [facility]  . IR GENERIC HISTORICAL  12/05/2016   IR ANGIO VERTEBRAL SEL SUBCLAVIAN INNOMINATE UNI R MOD SED 12/05/2016 Julieanne Cotton, MD [facility]  . IR GENERIC HISTORICAL  12/05/2016   IR  ANGIO VERTEBRAL SEL VERTEBRAL UNI L MOD SED 12/05/2016 Julieanne Cotton, MD [facility]  . RADIOLOGY WITH ANESTHESIA [name] 07/24/2016   Procedure: Stenting;  Surgeon: Julieanne Cotton, MD;  Location: [facility] OR;  Service: Radiology;  Laterality: [name];  . RADIOLOGY WITH ANESTHESIA [name] 07/28/2016   Procedure: RADIOLOGY WITH ANESTHESIA;  Surgeon: Medication Radiologist, MD;  Location: [facility] OR;  Service: Radiology;  Laterality: [name];  . REDUCTION MAMMAPLASTY Bilateral   . SHOULDER ARTHROSCOPY WITH OPEN ROTATOR CUFF REPAIR AND DISTAL CLAVICLE ACROMINECTOMY Right 02/04/2015   Procedure: SHOULDER  ARTHROSCOPY WITH SUBACROMIAL DECOMPRESSION AND OPEN ROTATOR CUFF REPAIR,;  Surgeon: Cammy Copa, MD;  Location: [facility] OR;  Service: Orthopedics;  Laterality: Right;  RIGHT SHOULDER DOA,SAD,MINI-OPEN ROTATOR CUFF TEAR REPAIR.    Allergies: Imitrex [sumatriptan]; Triptans; Plavix [clopidogrel bisulfate]; Cymbalta [duloxetine hcl]; Lipitor [atorvastatin]; and Topamax [topiramate]  Medications: Prior to Admission medications   Medication Sig Start Date End Date Taking? Authorizing Provider  aspirin EC 81 MG tablet Take 81 mg by mouth daily.   Yes [provider]  Aspirin-Acetaminophen-Caffeine (GOODY HEADACHE PO) Take 1 packet by mouth daily as needed (headache).   Yes [provider]  BRILINTA 90 MG TABS tablet Take 45-90 mg by mouth 2 (two) times daily. 90 mg in the morning, and 45 mg at bedtime 01/15/18  Yes [provider]  chlorthalidone (HYGROTON) 25 MG tablet Take 25 mg by mouth at bedtime.  09/03/15  Yes [provider]  diphenhydramine-acetaminophen (TYLENOL PM) 25-500 MG TABS tablet Take 2 tablets by mouth at bedtime as needed (sleep).    Yes [provider]  Empagliflozin-Metformin HCl (SYNJARDY) 12.02-999 MG TABS Take 1 tablet by mouth 2 (two) times daily.    Yes [provider]  Feverfew 380 MG CAPS Take 1 capsule by mouth 3 (three) times daily. Headaches   Yes [provider]  Insulin Glargine-Lixisenatide (SOLIQUA) 100-33 UNT-MCG/ML SOPN Inject 36 Units into the skin every other day.   Yes [provider]  nebivolol (BYSTOLIC) 10 MG tablet Take 10 mg by mouth daily.    Yes [provider]  oxyCODONE-acetaminophen (PERCOCET) 10-325 MG tablet Take 1 tablet by mouth every 8 (eight) hours as needed for pain.    Yes [provider]  pravastatin (PRAVACHOL) 80 MG tablet Take 80 mg by mouth at bedtime.    Yes [provider]  pregabalin (LYRICA) 75 MG capsule Take 75 mg by mouth 2 (two) times  daily.  11/08/17 11/08/18 Yes [provider]  promethazine (PHENERGAN) 25 MG tablet Take 25 mg by mouth 3 (three) times daily as needed for nausea or vomiting.    Yes [provider]  tiZANidine (ZANAFLEX) 4 MG tablet Take 4 mg by mouth every 8 (eight) hours as needed (migraines).    Yes [provider]     Family History  Problem Relation Age of Onset  . Hypertension Mother   . Hypertension Father   . Cancer Maternal Aunt   . Breast cancer Maternal Aunt   . Cancer Maternal Uncle     Social History   Socioeconomic History  . Marital status: Single    Spouse name: Not on file  . Number of children: 4  . Years of education: College   . Highest education level: Not on file  Occupational History  . Occupation: [name]    Employer: [employer]  Social Needs  . Financial resource strain: Not on file  . Food insecurity:    Worry:  Not on file    Inability: Not on file  . Transportation needs:    Medical: Not on file    Non-medical: Not on file  Tobacco Use  . Smoking status: Never Smoker  . Smokeless tobacco: Never Used  Substance and Sexual Activity  . Alcohol use: Yes    Alcohol/week: 0.0 oz    Comment: occasional  . Drug use: No  . Sexual activity: Not Currently  Lifestyle  . Physical activity:    Days per week: Not on file    Minutes per session: Not on file  . Stress: Not on file  Relationships  . Social connections:    Talks on phone: Not on file    Gets together: Not on file    Attends religious service: Not on file    Active member of club or organization: Not on file    Attends meetings of clubs or organizations: Not on file    Relationship status: Not on file  Other Topics Concern  . Not on file  Social History Narrative   Patient is single with 4 children.   Patient is right handed.   Patient has college education.   Patient drinks 1 cup daily.    Review of Systems: A 12 point ROS discussed and pertinent positives are  indicated in the HPI above.  All other systems are negative.  Review of Systems  Constitutional: Negative for activity change, appetite change, fatigue and fever.  HENT: Negative for tinnitus and trouble swallowing.   Eyes: Positive for visual disturbance.  Respiratory: Negative for cough and shortness of breath.   Cardiovascular: Negative for chest pain.  Gastrointestinal: Negative for abdominal pain.  Musculoskeletal: Negative for gait problem.  Neurological: Positive for weakness and headaches. Negative for dizziness, tremors, seizures, syncope, facial asymmetry, speech difficulty, light-headedness and numbness.  Psychiatric/Behavioral: Negative for behavioral problems and confusion.    Vital Signs: BP (!) 131/96   Pulse 65   Temp 98.2 F (36.8 C) (Oral)   Resp 16   Ht 5' 5.5" (1.664 m)   Wt 224 lb (101.6 kg)   SpO2 100%   BMI 36.71 kg/m   Physical Exam  Constitutional: She is oriented to person, place, and time.  HENT:  Head: Atraumatic.  Eyes: EOM are normal.  Neck: Neck supple.  Cardiovascular: Normal rate, regular rhythm and normal heart sounds.  Pulmonary/Chest: Effort normal and breath sounds normal.  Abdominal: Soft. Bowel sounds are normal.  Musculoskeletal: Normal range of motion.  Neurological: She is alert and oriented to person, place, and time.  Skin: Skin is warm and dry.  Psychiatric: She has a normal mood and affect. Her behavior is normal. Judgment and thought content normal.  Nursing note and vitals reviewed.   Imaging: No results found.  Labs:  CBC: Recent Labs    03/06/18 1246  WBC 5.6  HGB 13.7  HCT 44.0  PLT 218    COAGS: No results for input(s): INR, APTT in the last 8760 hours.  BMP: Recent Labs    06/07/17 1155 12/20/17 1600 03/06/18 1246  NA  --   --  142  K  --   --  4.2  CL  --   --  107  CO2  --   --  25  GLUCOSE  --   --  123*  BUN  --   --  22*  CALCIUM  --   --  9.2  CREATININE 1.28* 1.47* 1.29*  GFRNONAA 48*  41* 47*  GFRAA 56* 47* 55*    LIVER FUNCTION TESTS: Recent Labs    03/06/18 1246  BILITOT 0.5  AST 26  ALT 23  ALKPHOS 143*  PROT 7.3  ALBUMIN 3.8    TUMOR MARKERS: No results for input(s): AFPTM, CEA, CA199, CHROMGRNA in the last 8760 hours.  Assessment and Plan:  Hx L MCA angioplasty/stent New Rt weakness and slurred speech last month Abnormal CTA Scheduled now for cerebral arteriogram Risks and benefits of cerebral angiogram with intervention were discussed with the patient including, but not limited to bleeding, infection, vascular injury, contrast induced renal failure, stroke or even death.  This interventional procedure involves the use of X-rays and because of the nature of the planned procedure, it is possible that we will have prolonged use of X-ray fluoroscopy.  Potential radiation risks to you include (but are not limited to) the following: - A slightly elevated risk for cancer  several years later in life. This risk is typically less than 0.5% percent. This risk is low in comparison to the normal incidence of human cancer, which is 33% for women and 50% for men according to the [employer]. - Radiation induced injury can include skin redness, resembling a rash, tissue breakdown / ulcers and hair loss (which can be temporary or permanent).   The likelihood of either of these occurring depends on the difficulty of the procedure and whether you are sensitive to radiation due to previous procedures, disease, or genetic conditions.   IF your procedure requires a prolonged use of radiation, you will be notified and given written instructions for further action.  It is your responsibility to monitor the irradiated area for the 2 weeks following the procedure and to notify your physician if you are concerned that you have suffered a radiation induced injury.    All of the patient's questions were answered, patient is agreeable to proceed.  Consent signed and  in chart.  Thank you for this interesting consult.  I greatly enjoyed meeting Porfirio OarDutchess D Duchesne and look forward to participating in their care.  A copy of this report was sent to the requesting provider on this date.  Electronically Signed: Robet LeuURPIN,Otila Starn A, PA-C 04/09/2018, 7:56 AM   I spent a total of  30 Minutes   in face to face in clinical consultation, greater than 50% of which was counseling/coordinating care for cerebral arteriogram

## 2018-04-09 NOTE — Sedation Documentation (Signed)
Per Devershwar, MD, Ordered to remove CO2 monitoring for procedure. He stated it was blocking his field of view. Supplemental oxygen and CO2 monitoring will no longer be ava liable for patient

## 2018-04-11 ENCOUNTER — Encounter (HOSPITAL_COMMUNITY): Payer: Self-pay | Admitting: Interventional Radiology

## 2018-04-12 ENCOUNTER — Encounter (HOSPITAL_COMMUNITY): Payer: Self-pay | Admitting: Interventional Radiology

## 2018-04-12 ENCOUNTER — Other Ambulatory Visit (HOSPITAL_COMMUNITY): Payer: Self-pay | Admitting: Interventional Radiology

## 2018-04-12 DIAGNOSIS — I771 Stricture of artery: Secondary | ICD-10-CM

## 2018-04-12 DIAGNOSIS — I639 Cerebral infarction, unspecified: Secondary | ICD-10-CM

## 2018-04-12 DIAGNOSIS — R531 Weakness: Secondary | ICD-10-CM

## 2018-05-07 ENCOUNTER — Institutional Professional Consult (permissible substitution): Payer: Self-pay | Admitting: Neurology

## 2018-06-03 ENCOUNTER — Other Ambulatory Visit: Payer: Self-pay | Admitting: Internal Medicine

## 2018-06-03 DIAGNOSIS — Z1231 Encounter for screening mammogram for malignant neoplasm of breast: Secondary | ICD-10-CM

## 2018-08-15 ENCOUNTER — Ambulatory Visit: Payer: BLUE CROSS/BLUE SHIELD

## 2018-10-29 ENCOUNTER — Telehealth (HOSPITAL_COMMUNITY): Payer: Self-pay | Admitting: Radiology

## 2018-10-29 NOTE — Telephone Encounter (Signed)
Spoke to pt today. She is due for her 6 month follow-up MRI/MRA to follow her hx of stroke and stenosis. The pt states that she has lost her job and her insurance. She cannot afford to have the imaging study right now. She will call back when she can have this done. JM

## 2018-12-11 DIAGNOSIS — Z0289 Encounter for other administrative examinations: Secondary | ICD-10-CM

## 2018-12-24 ENCOUNTER — Emergency Department (HOSPITAL_COMMUNITY)
Admission: EM | Admit: 2018-12-24 | Discharge: 2018-12-25 | Disposition: A | Discharge status: Home / Self Care | Payer: Medicaid Other | Attending: Emergency Medicine | Admitting: Emergency Medicine

## 2018-12-24 ENCOUNTER — Encounter (HOSPITAL_COMMUNITY): Payer: Self-pay

## 2018-12-24 ENCOUNTER — Other Ambulatory Visit: Payer: Self-pay

## 2018-12-24 DIAGNOSIS — R112 Nausea with vomiting, unspecified: Secondary | ICD-10-CM | POA: Diagnosis not present

## 2018-12-24 DIAGNOSIS — R51 Headache: Secondary | ICD-10-CM | POA: Diagnosis not present

## 2018-12-24 DIAGNOSIS — Z5321 Procedure and treatment not carried out due to patient leaving prior to being seen by health care provider: Secondary | ICD-10-CM | POA: Diagnosis not present

## 2018-12-24 DIAGNOSIS — R29810 Facial weakness: Secondary | ICD-10-CM | POA: Diagnosis not present

## 2018-12-24 LAB — COMPREHENSIVE METABOLIC PANEL
ALBUMIN: 3.8 g/dL (ref 3.5–5.0)
ALT: 17 U/L (ref 0–44)
ANION GAP: 11 (ref 5–15)
AST: 23 U/L (ref 15–41)
Alkaline Phosphatase: 150 U/L — ABNORMAL HIGH (ref 38–126)
BILIRUBIN TOTAL: 0.2 mg/dL — AB (ref 0.3–1.2)
BUN: 20 mg/dL (ref 6–20)
CALCIUM: 8.8 mg/dL — AB (ref 8.9–10.3)
CO2: 22 mmol/L (ref 22–32)
Chloride: 103 mmol/L (ref 98–111)
Creatinine, Ser: 1.27 mg/dL — ABNORMAL HIGH (ref 0.44–1.00)
GFR calc Af Amer: 57 mL/min — ABNORMAL LOW (ref >60)
GFR calc non Af Amer: 49 mL/min — ABNORMAL LOW (ref >60)
Glucose, Bld: 237 mg/dL — ABNORMAL HIGH (ref 70–99)
Potassium: 4 mmol/L (ref 3.5–5.1)
SODIUM: 136 mmol/L (ref 135–145)
TOTAL PROTEIN: 7.6 g/dL (ref 6.5–8.1)

## 2018-12-24 LAB — URINALYSIS, ROUTINE W REFLEX MICROSCOPIC
BILIRUBIN URINE: NEGATIVE
Bacteria, UA: NONE SEEN
KETONES UR: NEGATIVE mg/dL
LEUKOCYTE UA: NEGATIVE
Nitrite: NEGATIVE
PROTEIN: NEGATIVE mg/dL
Specific Gravity, Urine: 1.03 (ref 1.005–1.030)
pH: 6 (ref 5.0–8.0)

## 2018-12-24 LAB — LIPASE, BLOOD: Lipase: 28 U/L (ref 11–51)

## 2018-12-24 LAB — CBC
HCT: 46.2 % — ABNORMAL HIGH (ref 36.0–46.0)
Hemoglobin: 14.3 g/dL (ref 12.0–15.0)
MCH: 26.8 pg (ref 26.0–34.0)
MCHC: 31 g/dL (ref 30.0–36.0)
MCV: 86.5 fL (ref 80.0–100.0)
NRBC: 0 % (ref 0.0–0.2)
PLATELETS: 227 10*3/uL (ref 150–400)
RBC: 5.34 MIL/uL — AB (ref 3.87–5.11)
RDW: 13.3 % (ref 11.5–15.5)
WBC: 9 10*3/uL (ref 4.0–10.5)

## 2018-12-24 MED ORDER — SODIUM CHLORIDE 0.9% FLUSH: 3.0000 mL | Freq: Once | INTRAVENOUS | Status: DC

## 2018-12-24 NOTE — ED Triage Notes (Signed)
Pt reports 2 weeks of headache, right side facial droop and nausea. Pt has hx of prior strokes with right sided deficits. Pt taking phenergan and percocet at home without relief of symptoms. Pt a.o, no weakness or other neuro deficit noted.

## 2018-12-25 NOTE — ED Notes (Signed)
Called pt for vitals X2. No answer.  

## 2018-12-25 NOTE — ED Notes (Signed)
Called pt X2 for vitals. No answer. 

## 2019-03-21 ENCOUNTER — Emergency Department (HOSPITAL_COMMUNITY): Payer: Medicaid Other

## 2019-03-21 ENCOUNTER — Other Ambulatory Visit: Payer: Self-pay

## 2019-03-21 ENCOUNTER — Encounter (HOSPITAL_COMMUNITY): Payer: Self-pay | Admitting: Emergency Medicine

## 2019-03-21 ENCOUNTER — Emergency Department (HOSPITAL_COMMUNITY)
Admission: EM | Admit: 2019-03-21 | Discharge: 2019-03-22 | Disposition: A | Discharge status: Home / Self Care | Payer: Medicaid Other | Attending: Emergency Medicine | Admitting: Emergency Medicine

## 2019-03-21 DIAGNOSIS — G43009 Migraine without aura, not intractable, without status migrainosus: Secondary | ICD-10-CM | POA: Insufficient documentation

## 2019-03-21 DIAGNOSIS — Z794 Long term (current) use of insulin: Secondary | ICD-10-CM | POA: Diagnosis not present

## 2019-03-21 DIAGNOSIS — Z79899 Other long term (current) drug therapy: Secondary | ICD-10-CM | POA: Diagnosis not present

## 2019-03-21 DIAGNOSIS — E119 Type 2 diabetes mellitus without complications: Secondary | ICD-10-CM | POA: Diagnosis not present

## 2019-03-21 DIAGNOSIS — R29810 Facial weakness: Secondary | ICD-10-CM | POA: Diagnosis not present

## 2019-03-21 DIAGNOSIS — I10 Essential (primary) hypertension: Secondary | ICD-10-CM | POA: Insufficient documentation

## 2019-03-21 DIAGNOSIS — R51 Headache: Secondary | ICD-10-CM | POA: Diagnosis present

## 2019-03-21 DIAGNOSIS — Z7982 Long term (current) use of aspirin: Secondary | ICD-10-CM | POA: Diagnosis not present

## 2019-03-21 LAB — COMPREHENSIVE METABOLIC PANEL
ALT: 15 U/L (ref 0–44)
AST: 19 U/L (ref 15–41)
Albumin: 3.3 g/dL — ABNORMAL LOW (ref 3.5–5.0)
Alkaline Phosphatase: 133 U/L — ABNORMAL HIGH (ref 38–126)
Anion gap: 7 (ref 5–15)
BUN: 18 mg/dL (ref 6–20)
CO2: 23 mmol/L (ref 22–32)
Calcium: 9.2 mg/dL (ref 8.9–10.3)
Chloride: 107 mmol/L (ref 98–111)
Creatinine, Ser: 1.45 mg/dL — ABNORMAL HIGH (ref 0.44–1.00)
GFR calc Af Amer: 48 mL/min — ABNORMAL LOW (ref >60)
GFR calc non Af Amer: 42 mL/min — ABNORMAL LOW (ref >60)
Glucose, Bld: 182 mg/dL — ABNORMAL HIGH (ref 70–99)
Potassium: 4.1 mmol/L (ref 3.5–5.1)
Sodium: 137 mmol/L (ref 135–145)
Total Bilirubin: 0.3 mg/dL (ref 0.3–1.2)
Total Protein: 6.4 g/dL — ABNORMAL LOW (ref 6.5–8.1)

## 2019-03-21 LAB — DIFFERENTIAL
Abs Immature Granulocytes: 0.01 10*3/uL (ref 0.00–0.07)
Basophils Absolute: 0 10*3/uL (ref 0.0–0.1)
Basophils Relative: 0 %
Eosinophils Absolute: 0.1 10*3/uL (ref 0.0–0.5)
Eosinophils Relative: 1 %
Immature Granulocytes: 0 %
Lymphocytes Relative: 29 %
Lymphs Abs: 2.4 10*3/uL (ref 0.7–4.0)
Monocytes Absolute: 0.6 10*3/uL (ref 0.1–1.0)
Monocytes Relative: 7 %
Neutro Abs: 5.2 10*3/uL (ref 1.7–7.7)
Neutrophils Relative %: 63 %

## 2019-03-21 LAB — CBC
HCT: 40.5 % (ref 36.0–46.0)
Hemoglobin: 12.9 g/dL (ref 12.0–15.0)
MCH: 27.7 pg (ref 26.0–34.0)
MCHC: 31.9 g/dL (ref 30.0–36.0)
MCV: 86.9 fL (ref 80.0–100.0)
Platelets: 237 10*3/uL (ref 150–400)
RBC: 4.66 MIL/uL (ref 3.87–5.11)
RDW: 13.7 % (ref 11.5–15.5)
WBC: 8.3 10*3/uL (ref 4.0–10.5)
nRBC: 0 % (ref 0.0–0.2)

## 2019-03-21 MED ORDER — PROCHLORPERAZINE EDISYLATE 10 MG/2ML IJ SOLN
10.0000 mg | Freq: Once | INTRAMUSCULAR | Status: AC
  Administered 2019-03-21: 10 mg via INTRAVENOUS
  Filled 2019-03-21: qty 2

## 2019-03-21 MED ORDER — SODIUM CHLORIDE 0.9 % IV BOLUS
1000.0000 mL | Freq: Once | INTRAVENOUS | Status: AC
  Administered 2019-03-21: 1000 mL via INTRAVENOUS

## 2019-03-21 MED ORDER — DIPHENHYDRAMINE HCL 50 MG/ML IJ SOLN
25.0000 mg | Freq: Once | INTRAMUSCULAR | Status: AC
  Administered 2019-03-21: 25 mg via INTRAVENOUS
  Filled 2019-03-21: qty 1

## 2019-03-21 MED ORDER — KETOROLAC TROMETHAMINE 30 MG/ML IJ SOLN
15.0000 mg | Freq: Once | INTRAMUSCULAR | Status: AC
  Administered 2019-03-21: 15 mg via INTRAVENOUS
  Filled 2019-03-21: qty 1

## 2019-03-21 MED ORDER — SODIUM CHLORIDE 0.9% FLUSH: 3.0000 mL | Freq: Once | INTRAVENOUS | Status: DC

## 2019-03-21 NOTE — ED Provider Notes (Signed)
[facility] EMERGENCY DEPARTMENT Provider Note   CSN: [id number] Arrival date & time: 03/21/19  1830    History   Chief Complaint Chief Complaint  Patient presents with  . Facial Droop  . Headache    HPI Patricia [name] is a 53 y.o. female who  has a past medical history of Anemia, Asthma, Bursitis, Depression, Diabetes mellitus without complication (HCC), GERD (gastroesophageal reflux disease), Headache, Hypertension, Kidney stones, PFO (patent foramen ovale), Rotator cuff tear, Stroke (HCC) (2009), and Syncope and collapse.  She states that she has a history of migraine headaches.  She does not currently have a doctor and has not been treated.  In the past she has tried multiple medications occluding triptan's.  She is allergic to those.  She has had a headache for several days which is similar to her previous migraine headaches except has not resolved.  She was given a single Percocet by her brother which eased her headache off for several hours but then returned.  This was 2 days ago.  She describes pain in the right occipital region which she describes "like someone is hacking at me when asked."  She denies visual changes.  She has associated photophobia and nausea without vomiting.  Her daughter thought that her chronic right-sided facial droop might of looked worse and asked her to come to the ER.  She describes no other new neurologic deficit.    HPI  Past Medical History:  Diagnosis Date  . Anemia    in the past  . Asthma    " a long time ago"  . Bursitis   . Depression    13 years ago -   . Diabetes mellitus without complication (HCC)    type  2  . GERD (gastroesophageal reflux disease)    occ. acid reflux  . Headache    migraines  . Hypertension   . Kidney stones   . PFO (patent foramen ovale)    hx of PFO with surgery done as an adult  . Rotator cuff tear    right  . Stroke [facility]) 2009   lasdt stroke July 2015  . Syncope and collapse     Patient Active Problem List   Diagnosis Date Noted  . Chronic daily headache 06/19/2017  . Intractable chronic migraine without aura and with status migrainosus 09/15/2016  . Depression 08/23/2016  . Cerebrovascular accident (CVA) due to occlusion of left middle cerebral artery (HCC) 07/28/2016  . Acute respiratory failure (HCC)   . Middle cerebral artery stenosis, left 07/24/2016  . Headache 03/23/2016  . Type 2 diabetes mellitus with complication, with long-term current use of insulin (HCC) 09/08/2015  . Hyperglycemia   . TIA (transient ischemic attack)   . AKI (acute kidney injury) (HCC) 02/20/2015  . ARF (acute renal failure) (HCC) 02/20/2015  . Rotator cuff tear 02/04/2015  . Occipital neuralgia 08/27/2014  . Type 2 diabetes mellitus with complication (HCC) 08/27/2014  . PFO (patent foramen ovale) 06/17/2014  . Hyperlipidemia 06/17/2014  . Morbid obesity, unspecified obesity type (HCC) 06/17/2014  . Acute CVA (cerebrovascular accident) (HCC) 05/22/2014  . Non-compliance 05/22/2014  . History of cardioembolic stroke 05/22/2014  . Status post device closure of ASD 05/22/2014  . HTN (hypertension), benign 05/22/2014  . DM type 2 (diabetes mellitus, type 2) (HCC) 05/22/2014    Past Surgical History:  Procedure Laterality Date  . ABDOMINAL HYSTERECTOMY    . BREAST REDUCTION SURGERY    . CARDIAC CATHETERIZATION    .  CARDIAC SURGERY  Closed hole in heart in 2009   Amplatzer Septal Occluder Ref [id number] (not listed in op notes, document scanned under MRI with written op notes  . HYSTEROTOMY    . IR ANGIO INTRA EXTRACRAN SEL COM CAROTID INNOMINATE BILAT MOD SED  04/09/2018  . IR ANGIO VERTEBRAL SEL SUBCLAVIAN INNOMINATE UNI R MOD SED  04/09/2018  . IR GENERIC HISTORICAL  06/23/2016   IR ANGIO INTRA EXTRACRAN SEL COM CAROTID INNOMINATE BILAT MOD SED 06/23/2016 Julieanne Cotton, MD [facility]  . IR GENERIC HISTORICAL  06/23/2016   IR ANGIO VERTEBRAL SEL VERTEBRAL BILAT MOD SED  06/23/2016 Julieanne Cotton, MD [facility]  . IR GENERIC HISTORICAL  06/29/2016   IR RADIOLOGIST EVAL & MGMT 06/29/2016 [facility]  . IR GENERIC HISTORICAL  07/24/2016   IR INTRA CRAN STENT 07/24/2016 Julieanne Cotton, MD [facility]  . IR GENERIC HISTORICAL  07/28/2016   IR PTA INTRACRANIAL 07/28/2016 Julieanne Cotton, MD [facility]  . IR GENERIC HISTORICAL  07/28/2016   IR ENDOVASC INTRACRANIAL INF OTHER THAN THROMBO ART INC DIAG ANGIO 07/28/2016 Julieanne Cotton, MD [facility]  . IR GENERIC HISTORICAL  08/07/2016   IR RADIOLOGIST EVAL & MGMT 08/07/2016 [facility]  . IR GENERIC HISTORICAL  12/05/2016   IR ANGIO INTRA EXTRACRAN SEL COM CAROTID INNOMINATE BILAT MOD SED 12/05/2016 Julieanne Cotton, MD [facility]  . IR GENERIC HISTORICAL  12/05/2016   IR ANGIO VERTEBRAL SEL SUBCLAVIAN INNOMINATE UNI R MOD SED 12/05/2016 Julieanne Cotton, MD [facility]  . IR GENERIC HISTORICAL  12/05/2016   IR ANGIO VERTEBRAL SEL VERTEBRAL UNI L MOD SED 12/05/2016 Julieanne Cotton, MD [facility]  . RADIOLOGY WITH ANESTHESIA [name] 07/24/2016   Procedure: Stenting;  Surgeon: Julieanne Cotton, MD;  Location: [facility] OR;  Service: Radiology;  Laterality: [name];  . RADIOLOGY WITH ANESTHESIA [name] 07/28/2016   Procedure: RADIOLOGY WITH ANESTHESIA;  Surgeon: Medication Radiologist, MD;  Location: [facility] OR;  Service: Radiology;  Laterality: [name];  . REDUCTION MAMMAPLASTY Bilateral   . SHOULDER ARTHROSCOPY WITH OPEN ROTATOR CUFF REPAIR AND DISTAL CLAVICLE ACROMINECTOMY Right 02/04/2015   Procedure: SHOULDER ARTHROSCOPY WITH SUBACROMIAL DECOMPRESSION AND OPEN ROTATOR CUFF REPAIR,;  Surgeon: Cammy Copa, MD;  Location: [facility] OR;  Service: Orthopedics;  Laterality: Right;  RIGHT SHOULDER DOA,SAD,MINI-OPEN ROTATOR CUFF TEAR REPAIR.     OB History   No obstetric history on file.      Home Medications    Prior to Admission medications   Medication Sig Start Date End Date Taking? Authorizing Provider  aspirin EC  81 MG tablet Take 81 mg by mouth daily.    [provider]  Aspirin-Acetaminophen-Caffeine (GOODY HEADACHE PO) Take 1 packet by mouth daily as needed (headache).    [provider]  BRILINTA 90 MG TABS tablet Take 45-90 mg by mouth 2 (two) times daily. 90 mg in the morning, and 45 mg at bedtime 01/15/18   [provider]  chlorthalidone (HYGROTON) 25 MG tablet Take 25 mg by mouth at bedtime.  09/03/15   [provider]  diphenhydramine-acetaminophen (TYLENOL PM) 25-500 MG TABS tablet Take 2 tablets by mouth at bedtime as needed (sleep).     [provider]  Empagliflozin-Metformin HCl (SYNJARDY) 12.02-999 MG TABS Take 1 tablet by mouth 2 (two) times daily.     [provider]  Feverfew 380 MG CAPS Take 1 capsule by mouth 3 (three) times daily. Headaches    [provider]  Insulin Glargine-Lixisenatide (SOLIQUA) 100-33 UNT-MCG/ML  SOPN Inject 36 Units into the skin every other day.    [provider]  nebivolol (BYSTOLIC) 10 MG tablet Take 10 mg by mouth daily.     [provider]  oxyCODONE-acetaminophen (PERCOCET) 10-325 MG tablet Take 1 tablet by mouth every 8 (eight) hours as needed for pain.     [provider]  pravastatin (PRAVACHOL) 80 MG tablet Take 80 mg by mouth at bedtime.     [provider]  pregabalin (LYRICA) 75 MG capsule Take 75 mg by mouth 2 (two) times daily.  11/08/17 11/08/18  [provider]  promethazine (PHENERGAN) 25 MG tablet Take 25 mg by mouth 3 (three) times daily as needed for nausea or vomiting.     [provider]  tiZANidine (ZANAFLEX) 4 MG tablet Take 4 mg by mouth every 8 (eight) hours as needed (migraines).     [provider]    Family History Family History  Problem Relation Age of Onset  . Hypertension Mother   . Hypertension Father   . Cancer Maternal Aunt   . Breast cancer Maternal Aunt   . Cancer Maternal Uncle     Social  History Social History   Tobacco Use  . Smoking status: Never Smoker  . Smokeless tobacco: Never Used  Substance Use Topics  . Alcohol use: Yes    Alcohol/week: 0.0 standard drinks    Comment: occasional  . Drug use: No     Allergies   Imitrex [sumatriptan]; Triptans; Plavix [clopidogrel bisulfate]; Cymbalta [duloxetine hcl]; Lipitor [atorvastatin]; and Topamax [topiramate]   Review of Systems Review of Systems Ten systems reviewed and are negative for acute change, except as noted in the HPI.    Physical Exam Updated Vital Signs BP (!) 168/106   Pulse 86   Temp 98.3 F (36.8 C)   Resp 18   SpO2 98%   Physical Exam Vitals signs and nursing note reviewed.  Constitutional:      General: She is not in acute distress.    Appearance: She is well-developed. She is not diaphoretic.  HENT:     Head: Normocephalic and atraumatic.     Right Ear: External ear normal.     Left Ear: External ear normal.     Mouth/Throat:     Pharynx: No oropharyngeal exudate.  Eyes:     General: No visual field deficit or scleral icterus.    Conjunctiva/sclera: Conjunctivae normal.     Pupils: Pupils are equal, round, and reactive to light.     Comments: No horizontal, vertical or rotational nystagmus  Neck:     Musculoskeletal: Normal range of motion and neck supple.     Thyroid: No thyromegaly.     Vascular: No JVD.     Comments: Full active and passive ROM without pain No midline or paraspinal tenderness No nuchal rigidity or meningeal signs Cardiovascular:     Rate and Rhythm: Normal rate and regular rhythm.     Heart sounds: Normal heart sounds. No murmur. No friction rub. No gallop.   Pulmonary:     Effort: Pulmonary effort is normal. No respiratory distress.     Breath sounds: Normal breath sounds. No wheezing or rales.  Abdominal:     General: Bowel sounds are normal. There is no distension.     Palpations: Abdomen is soft. There is no mass.     Tenderness: There is no  abdominal tenderness. There is no guarding or rebound.  Musculoskeletal: Normal range of motion.  General: No tenderness.     Comments: No meningismus  Lymphadenopathy:     Cervical: No cervical adenopathy.  Skin:    General: Skin is warm and dry.     Findings: No rash.  Neurological:     Mental Status: She is alert and oriented to person, place, and time.     Cranial Nerves: Cranial nerve deficit, dysarthria and facial asymmetry present.     Sensory: No sensory deficit.     Motor: Weakness present. No abnormal muscle tone.     Coordination: Coordination normal.     Deep Tendon Reflexes: Reflexes are normal and symmetric.     Comments: Patient with right-sided facial asymmetry, chronic right-sided weakness.  No new focal neurologic deficits  Psychiatric:        Behavior: Behavior normal.        Thought Content: Thought content normal.        Judgment: Judgment normal.      ED Treatments / Results  Labs (all labs ordered are listed, but only abnormal results are displayed) Labs Reviewed  COMPREHENSIVE METABOLIC PANEL - Abnormal; Notable for the following components:      Result Value   Glucose, Bld 182 (*)    Creatinine, Ser 1.45 (*)    Total Protein 6.4 (*)    Albumin 3.3 (*)    Alkaline Phosphatase 133 (*)    GFR calc non Af Amer 42 (*)    GFR calc Af Amer 48 (*)    All other components within normal limits  CBC  DIFFERENTIAL    EKG EKG Interpretation  Date/Time:  Friday Mar 21 2019 18:51:24 EDT Ventricular Rate:  86 PR Interval:  164 QRS Duration: 84 QT Interval:  360 QTC Calculation: 430 R Axis:   -25 Text Interpretation:  Normal sinus rhythm Nonspecific T wave abnormality Confirmed by Cathren LaineSteinl, Kevin ([id number]) on 03/21/2019 9:40:20 PM   Radiology No results found.  Procedures Procedures (including critical care time)  Medications Ordered in ED Medications  sodium chloride flush (NS) 0.9 % injection 3 mL (has no administration in time range)      Initial Impression / Assessment and Plan / ED Course  I have reviewed the triage vital signs and the nursing notes.  Pertinent labs & imaging results that were available during my care of the patient were reviewed by me and considered in my medical decision making (see chart for details).        Patient with bad headache.  History of migraines.  No meningismus, CT scan negative for intracranial hemorrhage or subarachnoid hemorrhage.  Patient does not here to have any new neurologic deficits.  She is just now receive fluids and migraine cocktail.  Have given signout to PA Antony MaduraKelly Humes who will assume care of the patient for appropriate disposition and reevaluation  Final Clinical Impressions(s) / ED Diagnoses   Final diagnoses:  None    ED Discharge Orders    None       Arthor CaptainHarris, Daneli Butkiewicz, PA-C 03/21/19 2352    Cathren LaineSteinl, Kevin, MD 03/22/19 [phone]

## 2019-03-21 NOTE — ED Triage Notes (Signed)
Pt reports she has had a headache for several days, unable to tell me how many exactly. Pt also concerned because she has a right sided facial droop for 2 or 3 days, hx of stroke with right sided facial droop. States this droop looks different. No droop noted. Alert and oriented X 4 and ambulatory without difficulty.

## 2019-03-21 NOTE — ED Notes (Signed)
Patient transported to CT 

## 2019-03-22 NOTE — ED Provider Notes (Signed)
1:05 AM Patient care assumed at shift change from [facility], PA-C.  History of migraine headaches presenting for headache similar to previous migraines.  Patient with no history of recent head injury or trauma.  No fever, nuchal rigidity, meningismus to suggest meningitis.  CT head without evidence of acute hemorrhage, hydrocephalus, significant infarct.  On reassessment, the patient has had significant improvement in headache symptoms following a migraine cocktail.  Pain down to 3/10 from 8/10 on arrival.  I do not believe further emergent workup is indicated at this time.  Return precautions discussed and provided.  Patient discharged in stable condition with no unaddressed concerns.  Today's Vitals   03/21/19 1849 03/21/19 2045 03/21/19 2100 03/22/19 0048  BP: (!) 168/106 (!) 151/90 (!) 149/90   Pulse: 86 72 76   Resp: 18 19 17    Temp: 98.3 F (36.8 C)     SpO2: 98% 99% 99%   PainSc: 7    3       Antony Madura, PA-C 03/22/19 0105    Ward, Layla Maw, DO 03/22/19 0113

## 2019-03-28 ENCOUNTER — Other Ambulatory Visit: Payer: Self-pay | Admitting: Internal Medicine

## 2019-03-28 DIAGNOSIS — Z1231 Encounter for screening mammogram for malignant neoplasm of breast: Secondary | ICD-10-CM

## 2019-05-15 ENCOUNTER — Ambulatory Visit
Admission: RE | Admit: 2019-05-15 | Discharge: 2019-05-15 | Disposition: A | Discharge status: Home / Self Care | Payer: Medicaid Other | Source: Ambulatory Visit | Attending: Internal Medicine | Admitting: Internal Medicine

## 2019-05-15 ENCOUNTER — Other Ambulatory Visit: Payer: Self-pay

## 2019-05-15 DIAGNOSIS — Z1231 Encounter for screening mammogram for malignant neoplasm of breast: Secondary | ICD-10-CM

## 2019-06-06 ENCOUNTER — Other Ambulatory Visit: Payer: Self-pay

## 2019-06-06 DIAGNOSIS — Z20822 Contact with and (suspected) exposure to covid-19: Secondary | ICD-10-CM

## 2019-06-08 LAB — NOVEL CORONAVIRUS, NAA: SARS-CoV-2, NAA: NOT DETECTED

## 2019-08-07 ENCOUNTER — Emergency Department (HOSPITAL_COMMUNITY): Payer: Medicaid Other

## 2019-08-07 ENCOUNTER — Emergency Department (HOSPITAL_COMMUNITY)
Admission: EM | Admit: 2019-08-07 | Discharge: 2019-08-08 | Disposition: A | Discharge status: Home / Self Care | Payer: Medicaid Other | Attending: Emergency Medicine | Admitting: Emergency Medicine

## 2019-08-07 DIAGNOSIS — Z8673 Personal history of transient ischemic attack (TIA), and cerebral infarction without residual deficits: Secondary | ICD-10-CM | POA: Insufficient documentation

## 2019-08-07 DIAGNOSIS — R531 Weakness: Secondary | ICD-10-CM

## 2019-08-07 DIAGNOSIS — R4781 Slurred speech: Secondary | ICD-10-CM | POA: Diagnosis not present

## 2019-08-07 DIAGNOSIS — Z79899 Other long term (current) drug therapy: Secondary | ICD-10-CM | POA: Diagnosis not present

## 2019-08-07 DIAGNOSIS — I10 Essential (primary) hypertension: Secondary | ICD-10-CM | POA: Insufficient documentation

## 2019-08-07 DIAGNOSIS — E119 Type 2 diabetes mellitus without complications: Secondary | ICD-10-CM | POA: Diagnosis not present

## 2019-08-07 DIAGNOSIS — R29810 Facial weakness: Secondary | ICD-10-CM | POA: Insufficient documentation

## 2019-08-07 DIAGNOSIS — G43809 Other migraine, not intractable, without status migrainosus: Secondary | ICD-10-CM | POA: Insufficient documentation

## 2019-08-07 DIAGNOSIS — Z7984 Long term (current) use of oral hypoglycemic drugs: Secondary | ICD-10-CM | POA: Diagnosis not present

## 2019-08-07 LAB — CBG MONITORING, ED: Glucose-Capillary: 338 mg/dL — ABNORMAL HIGH (ref 70–99)

## 2019-08-07 MED ORDER — SODIUM CHLORIDE 0.9% FLUSH
3.0000 mL | Freq: Once | INTRAVENOUS | Status: AC
  Administered 2019-08-08: 3 mL via INTRAVENOUS

## 2019-08-08 ENCOUNTER — Emergency Department (HOSPITAL_COMMUNITY): Payer: Medicaid Other

## 2019-08-08 ENCOUNTER — Encounter (HOSPITAL_COMMUNITY): Payer: Self-pay

## 2019-08-08 DIAGNOSIS — R531 Weakness: Secondary | ICD-10-CM

## 2019-08-08 LAB — COMPREHENSIVE METABOLIC PANEL
ALT: 16 U/L (ref 0–44)
AST: 17 U/L (ref 15–41)
Albumin: 3.4 g/dL — ABNORMAL LOW (ref 3.5–5.0)
Alkaline Phosphatase: 136 U/L — ABNORMAL HIGH (ref 38–126)
Anion gap: 12 (ref 5–15)
BUN: 22 mg/dL — ABNORMAL HIGH (ref 6–20)
CO2: 22 mmol/L (ref 22–32)
Calcium: 8.9 mg/dL (ref 8.9–10.3)
Chloride: 101 mmol/L (ref 98–111)
Creatinine, Ser: 1.66 mg/dL — ABNORMAL HIGH (ref 0.44–1.00)
GFR calc Af Amer: 41 mL/min — ABNORMAL LOW (ref >60)
GFR calc non Af Amer: 35 mL/min — ABNORMAL LOW (ref >60)
Glucose, Bld: 328 mg/dL — ABNORMAL HIGH (ref 70–99)
Potassium: 3.8 mmol/L (ref 3.5–5.1)
Sodium: 135 mmol/L (ref 135–145)
Total Bilirubin: 0.7 mg/dL (ref 0.3–1.2)
Total Protein: 6.8 g/dL (ref 6.5–8.1)

## 2019-08-08 LAB — DIFFERENTIAL
Abs Immature Granulocytes: 0.02 10*3/uL (ref 0.00–0.07)
Basophils Absolute: 0 10*3/uL (ref 0.0–0.1)
Basophils Relative: 0 %
Eosinophils Absolute: 0.1 10*3/uL (ref 0.0–0.5)
Eosinophils Relative: 1 %
Immature Granulocytes: 0 %
Lymphocytes Relative: 33 %
Lymphs Abs: 2.7 10*3/uL (ref 0.7–4.0)
Monocytes Absolute: 0.5 10*3/uL (ref 0.1–1.0)
Monocytes Relative: 7 %
Neutro Abs: 4.7 10*3/uL (ref 1.7–7.7)
Neutrophils Relative %: 59 %

## 2019-08-08 LAB — I-STAT CHEM 8, ED
BUN: 23 mg/dL — ABNORMAL HIGH (ref 6–20)
Calcium, Ion: 1.13 mmol/L — ABNORMAL LOW (ref 1.15–1.40)
Chloride: 99 mmol/L (ref 98–111)
Creatinine, Ser: 1.6 mg/dL — ABNORMAL HIGH (ref 0.44–1.00)
Glucose, Bld: 316 mg/dL — ABNORMAL HIGH (ref 70–99)
HCT: 42 % (ref 36.0–46.0)
Hemoglobin: 14.3 g/dL (ref 12.0–15.0)
Potassium: 3.7 mmol/L (ref 3.5–5.1)
Sodium: 136 mmol/L (ref 135–145)
TCO2: 22 mmol/L (ref 22–32)

## 2019-08-08 LAB — CBC
HCT: 39.4 % (ref 36.0–46.0)
Hemoglobin: 12.9 g/dL (ref 12.0–15.0)
MCH: 28 pg (ref 26.0–34.0)
MCHC: 32.7 g/dL (ref 30.0–36.0)
MCV: 85.5 fL (ref 80.0–100.0)
Platelets: 232 10*3/uL (ref 150–400)
RBC: 4.61 MIL/uL (ref 3.87–5.11)
RDW: 13.1 % (ref 11.5–15.5)
WBC: 8.1 10*3/uL (ref 4.0–10.5)
nRBC: 0 % (ref 0.0–0.2)

## 2019-08-08 LAB — I-STAT BETA HCG BLOOD, ED (MC, WL, AP ONLY): I-stat hCG, quantitative: 5.9 m[IU]/mL — ABNORMAL HIGH (ref <5)

## 2019-08-08 LAB — PROTIME-INR
INR: 1 (ref 0.8–1.2)
Prothrombin Time: 13 seconds (ref 11.4–15.2)

## 2019-08-08 LAB — APTT: aPTT: 27 seconds (ref 24–36)

## 2019-08-08 MED ORDER — IOHEXOL 350 MG/ML SOLN
75.0000 mL | Freq: Once | INTRAVENOUS | Status: AC | PRN
  Administered 2019-08-08: 75 mL via INTRAVENOUS

## 2019-08-08 MED ORDER — KETOROLAC TROMETHAMINE 15 MG/ML IJ SOLN
15.0000 mg | Freq: Once | INTRAMUSCULAR | Status: AC
  Administered 2019-08-08: 15 mg via INTRAVENOUS
  Filled 2019-08-08: qty 1

## 2019-08-08 MED ORDER — VALPROATE SODIUM 500 MG/5ML IV SOLN
500.0000 mg | Freq: Once | INTRAVENOUS | Status: AC
  Administered 2019-08-08: 500 mg via INTRAVENOUS
  Filled 2019-08-08: qty 5

## 2019-08-08 MED ORDER — DIPHENHYDRAMINE HCL 50 MG/ML IJ SOLN
12.5000 mg | Freq: Once | INTRAMUSCULAR | Status: AC
  Administered 2019-08-08: 12.5 mg via INTRAVENOUS
  Filled 2019-08-08: qty 1

## 2019-08-08 MED ORDER — PROCHLORPERAZINE EDISYLATE 10 MG/2ML IJ SOLN
10.0000 mg | Freq: Once | INTRAMUSCULAR | Status: AC
  Administered 2019-08-08: 10 mg via INTRAVENOUS
  Filled 2019-08-08: qty 2

## 2019-08-08 NOTE — ED Notes (Signed)
Pt ambulated in room with steady gait  

## 2019-08-08 NOTE — ED Notes (Signed)
Patient transported to MRI 

## 2019-08-08 NOTE — ED Notes (Signed)
Pt arrived via POV and reports R sided weakness, facial droop, unsteady gait, and difficulty speaking that started at 10pm.  R arm drift present.  Pt taken straight to bridge and Code Stroke activated.  Dr. Christy Gentles notified and at bridge to assess.  Upon further eval family reports intermittent headache x 1 week.

## 2019-08-08 NOTE — ED Triage Notes (Signed)
Pt comes in POV, LSN 11pm, hx of stroke in the past, sudden onset of slurred speech, unsteady gait, r sided weakness and facial droop, pt also c/o of headache all day

## 2019-08-08 NOTE — ED Provider Notes (Signed)
[facility] EMERGENCY DEPARTMENT Provider Note   CSN: [id number] Arrival date & time: 08/07/19  2346  An emergency department physician performed an initial assessment on this suspected stroke patient at 2356.  History   Chief Complaint Chief Complaint  Patient presents with  . Code Stroke   Level 5 caveat due to acuity of condition HPI Patricia [name] is a 53 y.o. female.     The history is provided by the patient and a relative.  Weakness Severity:  Severe Onset quality:  Sudden Duration:  1 hour Timing:  Constant Progression:  Worsening Chronicity:  New Relieved by:  None tried Worsened by:  Nothing Patient presents via private vehicle with new onset weakness.  Patient had onset of slurred speech and right-sided weakness and facial droop.  Patient also Reported headache all day  Past Medical History:  Diagnosis Date  . Anemia    in the past  . Asthma    " a long time ago"  . Bursitis   . Depression    13 years ago -   . Diabetes mellitus without complication (HCC)    type  2  . GERD (gastroesophageal reflux disease)    occ. acid reflux  . Headache    migraines  . Hypertension   . Kidney stones   . PFO (patent foramen ovale)    hx of PFO with surgery done as an adult  . Rotator cuff tear    right  . Stroke [facility]) 2009   lasdt stroke July 2015  . Syncope and collapse     Patient Active Problem List   Diagnosis Date Noted  . Chronic daily headache 06/19/2017  . Intractable chronic migraine without aura and with status migrainosus 09/15/2016  . Depression 08/23/2016  . Cerebrovascular accident (CVA) due to occlusion of left middle cerebral artery (HCC) 07/28/2016  . Acute respiratory failure (HCC)   . Middle cerebral artery stenosis, left 07/24/2016  . Headache 03/23/2016  . Type 2 diabetes mellitus with complication, with long-term current use of insulin (HCC) 09/08/2015  . Hyperglycemia   . TIA (transient ischemic attack)    . AKI (acute kidney injury) (HCC) 02/20/2015  . ARF (acute renal failure) (HCC) 02/20/2015  . Rotator cuff tear 02/04/2015  . Occipital neuralgia 08/27/2014  . Type 2 diabetes mellitus with complication (HCC) 08/27/2014  . PFO (patent foramen ovale) 06/17/2014  . Hyperlipidemia 06/17/2014  . Morbid obesity, unspecified obesity type (HCC) 06/17/2014  . Acute CVA (cerebrovascular accident) (HCC) 05/22/2014  . Non-compliance 05/22/2014  . History of cardioembolic stroke 05/22/2014  . Status post device closure of ASD 05/22/2014  . HTN (hypertension), benign 05/22/2014  . DM type 2 (diabetes mellitus, type 2) (HCC) 05/22/2014    Past Surgical History:  Procedure Laterality Date  . ABDOMINAL HYSTERECTOMY    . BREAST REDUCTION SURGERY    . CARDIAC CATHETERIZATION    . CARDIAC SURGERY  Closed hole in heart in 2009   Amplatzer Septal Occluder Ref [id number] (not listed in op notes, document scanned under MRI with written op notes  . HYSTEROTOMY    . IR ANGIO INTRA EXTRACRAN SEL COM CAROTID INNOMINATE BILAT MOD SED  04/09/2018  . IR ANGIO VERTEBRAL SEL SUBCLAVIAN INNOMINATE UNI R MOD SED  04/09/2018  . IR GENERIC HISTORICAL  06/23/2016   IR ANGIO INTRA EXTRACRAN SEL COM CAROTID INNOMINATE BILAT MOD SED 06/23/2016 Julieanne Cotton, MD [facility]  . IR GENERIC HISTORICAL  06/23/2016   IR ANGIO  VERTEBRAL SEL VERTEBRAL BILAT MOD SED 06/23/2016 Luanne Bras, MD [facility]  . IR GENERIC HISTORICAL  06/29/2016   IR RADIOLOGIST EVAL & MGMT 06/29/2016 [facility]  . IR GENERIC HISTORICAL  07/24/2016   IR INTRA CRAN STENT 07/24/2016 Luanne Bras, MD [facility]  . IR GENERIC HISTORICAL  07/28/2016   IR PTA INTRACRANIAL 07/28/2016 Luanne Bras, MD [facility]  . IR GENERIC HISTORICAL  07/28/2016   IR ENDOVASC INTRACRANIAL INF OTHER THAN THROMBO ART INC DIAG ANGIO 07/28/2016 Luanne Bras, MD [facility]  . IR GENERIC HISTORICAL  08/07/2016   IR RADIOLOGIST EVAL & MGMT 08/07/2016  [facility]  . IR GENERIC HISTORICAL  12/05/2016   IR ANGIO INTRA EXTRACRAN SEL COM CAROTID INNOMINATE BILAT MOD SED 12/05/2016 Luanne Bras, MD [facility]  . IR GENERIC HISTORICAL  12/05/2016   IR ANGIO VERTEBRAL SEL SUBCLAVIAN INNOMINATE UNI R MOD SED 12/05/2016 Luanne Bras, MD [facility]  . IR GENERIC HISTORICAL  12/05/2016   IR ANGIO VERTEBRAL SEL VERTEBRAL UNI L MOD SED 12/05/2016 Luanne Bras, MD [facility]  . RADIOLOGY WITH ANESTHESIA [name] 07/24/2016   Procedure: Stenting;  Surgeon: Luanne Bras, MD;  Location: [place];  Service: Radiology;  Laterality: [name];  . RADIOLOGY WITH ANESTHESIA [name] 07/28/2016   Procedure: RADIOLOGY WITH ANESTHESIA;  Surgeon: Medication Radiologist, MD;  Location: [place];  Service: Radiology;  Laterality: [name];  . REDUCTION MAMMAPLASTY Bilateral   . SHOULDER ARTHROSCOPY WITH OPEN ROTATOR CUFF REPAIR AND DISTAL CLAVICLE ACROMINECTOMY Right 02/04/2015   Procedure: SHOULDER ARTHROSCOPY WITH SUBACROMIAL DECOMPRESSION AND OPEN ROTATOR CUFF REPAIR,;  Surgeon: Meredith Pel, MD;  Location: [place];  Service: Orthopedics;  Laterality: Right;  RIGHT SHOULDER DOA,SAD,MINI-OPEN ROTATOR CUFF TEAR REPAIR.     OB History   No obstetric history on file.      Home Medications    Prior to Admission medications   Medication Sig Start Date End Date Taking? Authorizing Provider  BRILINTA 90 MG TABS tablet Take 90 mg by mouth 2 (two) times daily.  01/15/18   [provider]  chlorthalidone (HYGROTON) 25 MG tablet Take 25 mg by mouth at bedtime.  09/03/15   [provider]  Empagliflozin-Metformin HCl (SYNJARDY) 12.02-999 MG TABS Take 1 tablet by mouth 2 (two) times daily.     [provider]  pravastatin (PRAVACHOL) 80 MG tablet Take 80 mg by mouth at bedtime.     [provider]    Family History Family History  Problem Relation Age of Onset  . Hypertension Mother   . Hypertension Father   . Cancer Maternal Aunt    . Breast cancer Maternal Aunt   . Cancer Maternal Uncle     Social History Social History   Tobacco Use  . Smoking status: Never Smoker  . Smokeless tobacco: Never Used  Substance Use Topics  . Alcohol use: Yes    Alcohol/week: 0.0 standard drinks    Comment: occasional  . Drug use: No     Allergies   Imitrex [sumatriptan], Triptans, Plavix [clopidogrel bisulfate], Cymbalta [duloxetine hcl], Lipitor [atorvastatin], and Topamax [topiramate]   Review of Systems Review of Systems  Unable to perform ROS: Acuity of condition  Neurological: Positive for weakness.     Physical Exam Updated Vital Signs BP 106/74   Pulse 74   Temp (!) 97.3 F (36.3 C) (Oral)   Resp 13   Wt 103.9 kg   SpO2 99%   BMI 37.53 kg/m   Physical Exam  CONSTITUTIONAL: Well developed/well nourished HEAD: Normocephalic/atraumatic EYES: EOMI ENMT: Mucous membranes moist NECK: supple no meningeal signs CV: S1/S2 noted, no murmurs/rubs/gallops noted LUNGS: Lungs are clear to auscultation bilaterally, no apparent distress ABDOMEN: soft, nontender NEURO: Pt is awake/alert, right facial droop noted EXTREMITIES: pulses normal/equal, full ROM SKIN: warm, color normal PSYCH: Anxious  ED Treatments / Results  Labs (all labs ordered are listed, but only abnormal results are displayed) Labs Reviewed  COMPREHENSIVE METABOLIC PANEL - Abnormal; Notable for the following components:      Result Value   Glucose, Bld 328 (*)    BUN 22 (*)    Creatinine, Ser 1.66 (*)    Albumin 3.4 (*)    Alkaline Phosphatase 136 (*)    GFR calc non Af Amer 35 (*)    GFR calc Af Amer 41 (*)    All other components within normal limits  I-STAT CHEM 8, ED - Abnormal; Notable for the following components:   BUN 23 (*)    Creatinine, Ser 1.60 (*)    Glucose, Bld 316 (*)    Calcium, Ion 1.13 (*)    All other components within normal limits  CBG MONITORING, ED - Abnormal; Notable for the following components:    Glucose-Capillary 338 (*)    All other components within normal limits  I-STAT BETA HCG BLOOD, ED ([facility], [facility], [facility] ONLY) - Abnormal; Notable for the following components:   I-stat hCG, quantitative 5.9 (*)    All other components within normal limits  PROTIME-INR  APTT  CBC  DIFFERENTIAL    EKG EKG Interpretation  Date/Time:  Friday August 08 2019 00:56:21 EDT Ventricular Rate:  73 PR Interval:    QRS Duration: 101 QT Interval:  448 QTC Calculation: 494 R Axis:   -28 Text Interpretation:  Sinus rhythm Borderline left axis deviation Low voltage, precordial leads Abnormal R-wave progression, late transition Borderline prolonged QT interval No significant change since last tracing Confirmed by Zadie RhineWickline, Antiono Ettinger ([id number]) on 08/08/2019 1:08:25 AM   Radiology Ct Head Code Stroke Wo Contrast  Result Date: 08/08/2019 CLINICAL DATA:  Code stroke. 53 year old female with headache and right side weakness. Last seen normal 2300 hours. EXAM: CT HEAD WITHOUT CONTRAST TECHNIQUE: Contiguous axial images were obtained from the base of the skull through the vertex without intravenous contrast. COMPARISON:  Head CT 03/21/2019. Brain MRI 03/06/2018. FINDINGS: Brain: Stable cerebral volume. Small areas of chronic encephalomalacia in both hemispheres, including the right superior frontal gyrus on series 3, image 26 and left subcortical white matter on image 22. Stable gray-white matter differentiation throughout the brain. No midline shift, ventriculomegaly, mass effect, evidence of mass lesion, intracranial hemorrhage or evidence of cortically based acute infarction. Vascular: Calcified atherosclerosis at the skull base. Left MCA M1 segment vascular stent redemonstrated. No suspicious intracranial vascular hyperdensity. Skull: No acute osseous abnormality identified. Sinuses/Orbits: Visualized paranasal sinuses and mastoids are stable and well pneumatized. Other: Visualized orbits and scalp soft tissues are within  normal limits. ASPECTS [facility]([place] Stroke Program Early CT Score) Total score (0-10 with 10 being normal): 10 (chronic encephalomalacia). IMPRESSION: 1. Stable non contrast CT appearance of the brain since May, including small areas of bilateral encephalomalacia and left MCA M1 segment vascular stent. 2. These results were communicated to Dr. Amada JupiterKirkpatrick at 12:07 amon 10/16/2020by text page via the [facility] messaging system. Electronically Signed   By: Odessa FlemingH  Hall M.D.   On: 08/08/2019 00:08    Procedures Procedures   Medications Ordered in ED Medications  sodium  chloride flush (NS) 0.9 % injection 3 mL (3 mLs Intravenous Given 08/08/19 0025)  iohexol (OMNIPAQUE) 350 MG/ML injection 75 mL (75 mLs Intravenous Contrast Given 08/08/19 0017)  prochlorperazine (COMPAZINE) injection 10 mg (10 mg Intravenous Given 08/08/19 0116)  diphenhydrAMINE (BENADRYL) injection 12.5 mg (12.5 mg Intravenous Given 08/08/19 0112)  ketorolac (TORADOL) 15 MG/ML injection 15 mg (15 mg Intravenous Given 08/08/19 0114)  valproate (DEPACON) 500 mg in dextrose 5 % 50 mL IVPB (0 mg Intravenous Stopped 08/08/19 0235)     Initial Impression / Assessment and Plan / ED Course  I have reviewed the triage vital signs and the nursing notes.  Pertinent labs & imaging results that were available during my care of the patient were reviewed by me and considered in my medical decision making (see chart for details).        Patient seen initially as a code stroke and was sent immediately to CT imaging.  She has since been seen by neurology who evaluated her including an MRI.  No signs of acute stroke.  Suspect this is related to migraine. Dr. Amada Jupiter with neurology recommends treating migraine.  If she is improved patient can be discharged.  1:13 AM Patient awake alert this time.  She is feeling improved.  She can move all extremities. Plan to treat migraines and reassess. Patient does have hyperglycemia.  2:56 AM Patient feels  improved and is ready for discharge.  She ambulated.  She will be referred to neurology as an outpatient as she would like better control of her migraines.  She has had poor reactions to gabapentin previously so we will not prescribe at this time Final Clinical Impressions(s) / ED Diagnoses   Final diagnoses:  Other migraine without status migrainosus, not intractable  Right sided weakness    ED Discharge Orders    None       Zadie Rhine, MD 08/08/19 [phone]

## 2019-08-08 NOTE — Code Documentation (Addendum)
Responded to Code stroke called in ED at 2356 for R sided weakness, unsteady gait, and dysphagia, LSN-2200, CBG-316. NIH-5 for R sided weakness, R facial droop, and aphasia. CT head negative for acute changes. CTA-no LVO. Pt transported to MRI-negative. Code stroke cancelled at [place].

## 2019-08-08 NOTE — Consult Note (Signed)
Neurology Consultation Reason for Consult: Right sided weakness Referring Physician: Christy Gentles, D  CC: right sided weakness  History is obtained from:patient  HPI: TEKOA HAMOR is a 53 y.o. female with a history of migraines, stroke s/p L M1 stent, syncope who presents with right sided weakness which was sudden in onset.This has happened before with her previous stroke, as well as with a migraine and negative MRI in 2019. She has had a severe migraine for the past week and then tonight, she started having to sit down a lot.   They were in the process of cooking some seafood when she began sitting down more and acting like her head was hurting.  Subsequently she complained of dizziness, and then was having some trouble walking and for that reason was brought into the emergency department.  She has facial droop normally, that worsens with fatigue.  She had a similar episode in 2019 where she was evaluated by Dr. Rory Percy was found a negative MRI and fully recovered.  She has a history of migraines and has a typical migraine for her currently.  It is occipital in location and has been waxing and waning for the past week.  LKW: 10 pm tpa given?: no, not a stroke   ROS: A 14 point ROS was performed and is negative except as noted in the HPI.   Past Medical History:  Diagnosis Date  . Anemia    in the past  . Asthma    " a long time ago"  . Bursitis   . Depression    13 years ago -   . Diabetes mellitus without complication ([place])    type  2  . GERD (gastroesophageal reflux disease)    occ. acid reflux  . Headache    migraines  . Hypertension   . Kidney stones   . PFO (patent foramen ovale)    hx of PFO with surgery done as an adult  . Rotator cuff tear    right  . Stroke [facility]) 2009   lasdt stroke July 2015  . Syncope and collapse      Family History  Problem Relation Age of Onset  . Hypertension Mother   . Hypertension Father   . Cancer Maternal Aunt   . Breast  cancer Maternal Aunt   . Cancer Maternal Uncle      Social History:  reports that she has never smoked. She has never used smokeless tobacco. She reports current alcohol use. She reports that she does not use drugs.  Exam: Current vital signs: Wt 104.3 kg   BMI 37.68 kg/m  Vital signs in last 24 hours: Weight:  [104.3 kg] 104.3 kg (10/16 0000)   Physical Exam  Constitutional: Appears well-developed and well-nourished.  Psych: Affect appropriate to situation Eyes: No scleral injection HENT: No OP obstrucion Head: Normocephalic.  Cardiovascular: Normal rate and regular rhythm.  Respiratory: Effort normal, non-labored breathing GI: Soft.  No distension. There is no tenderness.  Skin: WDI  Neuro: Mental Status: Patient is awake, alert, oriented to person, place, month, year, and situation. Patient is able to give a clear and coherent history. No signs of aphasia or neglect Cranial Nerves: II: Visual Fields are full to finger wiggling, but she has some difficulty with finger coutnign on the right. Pupils are equal, round, and reactive to light.   III,IV, VI: EOMI without ptosis or diploplia.  V: Facial sensation is symmetric to temperature VII: Facial movement with right facial weakness, but no  air leak when puffing cheeks.  VIII: hearing is intact to voice X: Uvula elevates symmetrically XI: Shoulder shrug is symmetric. XII: tongue is midline without atrophy or fasciculations.  Motor: Tone is normal. Bulk is normal. 5/5 on left, she has give-way weakness on the right, at least 4/5 Sensory: Sensation is symmetric to light touch and temperature. Deep Tendon Reflexes: 2+ and symmetric in the biceps and patellae.  Cerebellar: FNF with wild ataxia-like movements on the right which dramatically improved with contralateral distraction  I have reviewed labs in epic and the results pertinent to this consultation are: Creatinine 1.60  I have reviewed the images obtained: CT  head-negative, CTA-widely patent stent  Impression: 53 year old female with a history of previous strokes as well as previous strokelike symptoms associated with headaches who presents with 1 week history of headache and right-sided weakness which is inconsistent and distractible ataxia.  I suspect that she has some degree of complicated migraine with some embellishment.  I would favor treating her headache.  Recommendations: 1) initially recommended CTA and MRI which have been performed prior to finalizing this note. 2) Compazine, Benadryl, Toradol (lower dose given renal function), Depacon 3) would favor treating this as complicated migraine.  Could consider gabapentin 300 3 times daily for 2 to 3 days if she continues to have some symptoms after above cocktail.   Ritta Slot, MD [facility] [phone]  If 7pm- 7am, please page neurology on call as listed in AMION.

## 2019-08-18 ENCOUNTER — Encounter: Payer: Self-pay | Admitting: Neurology

## 2019-09-02 ENCOUNTER — Encounter: Payer: Self-pay | Admitting: Neurology

## 2019-09-09 ENCOUNTER — Encounter: Payer: Self-pay | Admitting: Neurology

## 2019-09-15 ENCOUNTER — Ambulatory Visit: Payer: Medicaid Other | Admitting: Neurology

## 2019-10-27 NOTE — Progress Notes (Signed)
NEUROLOGY CONSULTATION NOTE  [name] MRN: [id number] DOB: 08/28/1966  Referring provider: Salli Real, MD Primary care provider: Salli Real, MD  Reason for consult:  Headache and dizziness.  HISTORY OF PRESENT ILLNESS: Patricia [name] is a 54 year old right-handed black female with HTN, diabetes, HLD and history of stroke who presents for headache and dizziness.  History supplemented by ED/hospital and prior neurologist notes.  She has past history of bi-hemispheric cardioembolic CVA secondary to PFO/ASD s/p closure in 2009 and left MCA territory stroke secondary to left MCA stenosis s/p stent in 2017.  Other pertinent stroke workup included negative vasculitis and hypercoagulable panel.  She has had recurrent episodes of speech difficulty.  Repeat MRIs and EEG were negative.   She started having migraines at 54 years old.  They may be a severe pounding pain that occurs behind right eye or right occipital region.  They are associated with nausea, vomiting, osmophobia, dizziness and sometimes visual aura (objects in vision seem tilted) as well as right facial weakness and speech disturbance.  They typically last 2 days to 1 week.  20 headache days a month on average.  Triggers include certain smells (scented lotions) or change in weather.  No relieving factors.  She has been followed by headache specialist at [facility] as well as at [facility] but treatment has been ineffective.  Takes a pain reliever once a week on-average (ineffective)  Current NSAIDS:  none Current analgesics:  Acetaminophen-diphennhdramine, Excedrin Current triptans:  Contraindicated (CVA) Current ergotamine:  Contraindicated (CVA) Current anti-emetic:  Promethazine 25mg  Current muscle relaxants:  Tizanidine 4mg  Current anti-anxiolytic:  none Current sleep aide:  none Current Antihypertensive medications:  none Current Antidepressant medications:  none Current Anticonvulsant medications:  none Current anti-CGRP:   Emgality (s/p 5th shot.  Ineffective); Nurtec (has not started it yet) Current Vitamins/Herbal/Supplements:  Magnesium oxide 400mg  daily Current Antihistamines/Decongestants:  Diphenhydramine PRN Other therapy:  none Hormone/birth control:  none  Past NSAIDS:  Ibuprofen, naproxen Past analgesics:  Percocet, Tylenol, Excedrin, tramadol Past abortive triptans:  Sumatriptan (side effects) Past abortive ergotamine:  none Past muscle relaxants:  none Past anti-emetic:  none Past antihypertensive medications:  Propranolol (ineffective) Past antidepressant medications:  Amitriptyline (ineffective), venlafaxine (ineffective), sertraline 50mg  Past anticonvulsant medications:  topiramate (cognitive deficits), Depakote (ineffective), gabapentin (ineffective), Keppra (ineffective), Lamictal (ineffective), Lyrica (ineffective), zonisamide 100mg  daily Past anti-CGRP:  Aimovig (ineffective) Past vitamins/Herbal/Supplements:  none Past antihistamines/decongestants:  none Other past therapies:  Botox  Caffeine:  1 cup of coffee once a week at most Alcohol:  rarely Smoker:  no Diet:  Hydrates Exercise:  no Depression:  mild; Anxiety:  Mild.  Currently without a job and needed to move in with her daughter. Other pain:  Hands/back pain Sleep hygiene:  Sleeps well in recliner (due to back pain) Family history of headache:  Son, grandson  Imaging (personally reviewed): 05/22/2014 MRI/MRA of head: Multiple acute infarcts in left hemisphere.  Old infarctions affecting the thalami, hemispheric white matter, genu of corpus callosum and right parietal cortex at the vertex.  Advanced intracranial disease, particularly severe at the M1 segment of left MCA showing 90% stenosis. 07/03/2014 CTA Head & Neck: Severe proximal left MCA stenosis.  Mild proximal right MCA stenosis.  Bilateral MCA branch vessel irregularity and attenuation.  Occlusion of the anterior cerebral arteries at their origins with some reconstituted  but severely diminished flow distally.  Mild left PT stenosis.  No evidence of cervical carotid or vertebral artery stenosis. 02/2015 MRI/MRA of head:  No acute intracranial process.  Multifocal small areas of encephalomalacia corresponding to prior left cerebral remote infarcts.  Remote bilateral basal ganglia and thalamus lacunar infarcts.  Moderate chronic small vessel ischemic changes.  Focal high-grade stenosis of left M1 segment, stable.  Chronically occluded right anterior cerebral artery with thready irregular left anterior cerebral artery, unchanged.  Thready, somewhat narrowed basilar artery, worse than prior exam though this may be accentuated by motion artifact.  Moderate stenosis left PT segment, worse than prior imaging.  Improved appearance of left middle cerebral artery from prior imaging. 01/05/2016 MRI Brain: No acute or subacute infarction.  Old infarctions affecting the thalami, basal ganglia, genu of the corpus callosum, hemispheric white matter and left and right cortical and subcortical infarctions at the vertex. 07/29/2016 MRI Brain wo: Patchy multifocal acute ischemic predominantly cortical left MCA territory infarcts. 07/28/2016 CT head/CT perfusion/CTA head and Neck: Possible nonocclusive thrombus in left MCA stent.  Abnormally prolonged transit throughout the left MCA superior division without evidence of acute core infarct or large vessel occlusion.  Chronically occluded or severely diseased A1 segments.  Mild right M1 MCA stenosis. 11/2016 Cerebral angiogram: Approximately 70-75% stenosis within the midportion of the stented segment of left MCA most likely secondary to combination of near intimal hyperplasia and atherosclerotic plaque.  Hypoplastic right anterior cerebral artery A1 segment.  Nonvisualization of left anterior cerebral artery A1 segment.  Dominant posterior communicating arteries bilaterally. 06/07/2017 MRI and MRA of head: No acute intracranial abnormality or abnormal  enhancement.  Small chronic infarctions in left frontal centrum semiovale and bilateral caudate heads.  Mild chronic small vessel ischemic changes.  Long segment flow-void of left M1 corresponding to intra-arterial stent.  Bilateral M2 and distal MCA circulation symmetric.  Hypoplastic right A1, nonvisualized left A1, and poor downstream flow related to signal in the bilateral anterior cerebral arteries, similar to prior cerebral angiogram.  Intracranial atherosclerosis with areas of stable mild to moderate stenosis in the anterior and posterior circulation. 12/20/2017 MRI/MRA of head: MRI of the brain unchanged.  Left MCA stent with associated signal loss.  50% stenosis of right M1 distally, unchanged.  Nonvisualization of either anterior cerebral artery A1 segments. 03/06/2018 MRI Brain wo: No acute intracranial abnormality.  Stable compared to prior imaging. 03/2018 Cerebral angiogram: Stable IntraStent stenosis of 75-80% within previously treated left middle cerebral artery severe stenosis with stent assisted angioplasty.  No significant change compared to prior study of February 2018.  PAST MEDICAL HISTORY: Past Medical History:  Diagnosis Date  . Anemia    in the past  . Asthma    " a long time ago"  . Bursitis   . Depression    13 years ago -   . Diabetes mellitus without complication (HCC)    type  2  . GERD (gastroesophageal reflux disease)    occ. acid reflux  . Headache    migraines  . Hypertension   . Kidney stones   . PFO (patent foramen ovale)    hx of PFO with surgery done as an adult  . Rotator cuff tear    right  . Stroke [facility]) 2009   lasdt stroke July 2015  . Syncope and collapse     PAST SURGICAL HISTORY: Past Surgical History:  Procedure Laterality Date  . ABDOMINAL HYSTERECTOMY    . BREAST REDUCTION SURGERY    . CARDIAC CATHETERIZATION    . CARDIAC SURGERY  Closed hole in heart in 2009   Amplatzer Septal Occluder Ref [id number] (not  listed in op notes, document  scanned under MRI with written op notes  . HYSTEROTOMY    . IR ANGIO INTRA EXTRACRAN SEL COM CAROTID INNOMINATE BILAT MOD SED  04/09/2018  . IR ANGIO VERTEBRAL SEL SUBCLAVIAN INNOMINATE UNI R MOD SED  04/09/2018  . IR GENERIC HISTORICAL  06/23/2016   IR ANGIO INTRA EXTRACRAN SEL COM CAROTID INNOMINATE BILAT MOD SED 06/23/2016 Julieanne Cotton, MD [facility]  . IR GENERIC HISTORICAL  06/23/2016   IR ANGIO VERTEBRAL SEL VERTEBRAL BILAT MOD SED 06/23/2016 Julieanne Cotton, MD [facility]  . IR GENERIC HISTORICAL  06/29/2016   IR RADIOLOGIST EVAL & MGMT 06/29/2016 [facility]  . IR GENERIC HISTORICAL  07/24/2016   IR INTRA CRAN STENT 07/24/2016 Julieanne Cotton, MD [facility]  . IR GENERIC HISTORICAL  07/28/2016   IR PTA INTRACRANIAL 07/28/2016 Julieanne Cotton, MD [facility]  . IR GENERIC HISTORICAL  07/28/2016   IR ENDOVASC INTRACRANIAL INF OTHER THAN THROMBO ART INC DIAG ANGIO 07/28/2016 Julieanne Cotton, MD [facility]  . IR GENERIC HISTORICAL  08/07/2016   IR RADIOLOGIST EVAL & MGMT 08/07/2016 [facility]  . IR GENERIC HISTORICAL  12/05/2016   IR ANGIO INTRA EXTRACRAN SEL COM CAROTID INNOMINATE BILAT MOD SED 12/05/2016 Julieanne Cotton, MD [facility]  . IR GENERIC HISTORICAL  12/05/2016   IR ANGIO VERTEBRAL SEL SUBCLAVIAN INNOMINATE UNI R MOD SED 12/05/2016 Julieanne Cotton, MD [facility]  . IR GENERIC HISTORICAL  12/05/2016   IR ANGIO VERTEBRAL SEL VERTEBRAL UNI L MOD SED 12/05/2016 Julieanne Cotton, MD [facility]  . RADIOLOGY WITH ANESTHESIA [name] 07/24/2016   Procedure: Stenting;  Surgeon: Julieanne Cotton, MD;  Location: [facility];  Service: Radiology;  Laterality: [name];  . RADIOLOGY WITH ANESTHESIA [name] 07/28/2016   Procedure: RADIOLOGY WITH ANESTHESIA;  Surgeon: Medication Radiologist, MD;  Location: [facility] OR;  Service: Radiology;  Laterality: [name];  . REDUCTION MAMMAPLASTY Bilateral   . SHOULDER ARTHROSCOPY WITH OPEN ROTATOR CUFF REPAIR AND DISTAL CLAVICLE ACROMINECTOMY Right  02/04/2015   Procedure: SHOULDER ARTHROSCOPY WITH SUBACROMIAL DECOMPRESSION AND OPEN ROTATOR CUFF REPAIR,;  Surgeon: Cammy Copa, MD;  Location: [facility] OR;  Service: Orthopedics;  Laterality: Right;  RIGHT SHOULDER DOA,SAD,MINI-OPEN ROTATOR CUFF TEAR REPAIR.    MEDICATIONS: Current Outpatient Medications on File Prior to Visit  Medication Sig Dispense Refill  . BRILINTA 90 MG TABS tablet Take 90 mg by mouth 2 (two) times daily.   2  . chlorthalidone (HYGROTON) 25 MG tablet Take 25 mg by mouth at bedtime.   5  . pravastatin (PRAVACHOL) 80 MG tablet Take 80 mg by mouth at bedtime.      No current facility-administered medications on file prior to visit.    ALLERGIES: Allergies  Allergen Reactions  . Imitrex [Sumatriptan] Anaphylaxis  . Triptans Anaphylaxis and Hives  . Plavix [Clopidogrel Bisulfate] Hives  . Cymbalta [Duloxetine Hcl] Nausea Only  . Lipitor [Atorvastatin] Rash  . Topamax [Topiramate] Other (See Comments)    Memory issues    FAMILY HISTORY: Family History  Problem Relation Age of Onset  . Hypertension Mother   . Hypertension Father   . Cancer Maternal Aunt   . Breast cancer Maternal Aunt   . Cancer Maternal Uncle    SOCIAL HISTORY: Social History   Socioeconomic History  . Marital status: Single    Spouse name: Not on file  . Number of children: 4  . Years of education: College   . Highest education level: Not on file  Occupational History  .  Occupation: [name]    Employer: [name]  Tobacco Use  . Smoking status: Never Smoker  . Smokeless tobacco: Never Used  Substance and Sexual Activity  . Alcohol use: Yes    Alcohol/week: 0.0 standard drinks    Comment: occasional  . Drug use: No  . Sexual activity: Not Currently  Other Topics Concern  . Not on file  Social History Narrative   Patient is single with 4 children.   Patient is right handed.   Patient has college education.   Patient drinks 1 cup daily.   Social Determinants of Health    Financial Resource Strain:   . Difficulty of Paying Living Expenses: Not on file  Food Insecurity:   . Worried About Charity fundraiser in the Last Year: Not on file  . Ran Out of Food in the Last Year: Not on file  Transportation Needs:   . Lack of Transportation (Medical): Not on file  . Lack of Transportation (Non-Medical): Not on file  Physical Activity:   . Days of Exercise per Week: Not on file  . Minutes of Exercise per Session: Not on file  Stress:   . Feeling of Stress : Not on file  Social Connections:   . Frequency of Communication with Friends and Family: Not on file  . Frequency of Social Gatherings with Friends and Family: Not on file  . Attends Religious Services: Not on file  . Active Member of Clubs or Organizations: Not on file  . Attends Archivist Meetings: Not on file  . Marital Status: Not on file  Intimate Partner Violence:   . Fear of Current or Ex-Partner: Not on file  . Emotionally Abused: Not on file  . Physically Abused: Not on file  . Sexually Abused: Not on file    REVIEW OF SYSTEMS: Constitutional: No fevers, chills, or sweats, no generalized fatigue, change in appetite Eyes: No visual changes, double vision, eye pain Ear, nose and throat: No hearing loss, ear pain, nasal congestion, sore throat Cardiovascular: No chest pain, palpitations Respiratory:  No shortness of breath at rest or with exertion, wheezes GastrointestinaI: No nausea, vomiting, diarrhea, abdominal pain, fecal incontinence Genitourinary:  No dysuria, urinary retention or frequency Musculoskeletal:  No neck pain, back pain Integumentary: No rash, pruritus, skin lesions Neurological: as above Psychiatric: No depression, insomnia, anxiety Endocrine: No palpitations, fatigue, diaphoresis, mood swings, change in appetite, change in weight, increased thirst Hematologic/Lymphatic:  No purpura, petechiae. Allergic/Immunologic: no itchy/runny eyes, nasal congestion, recent  allergic reactions, rashes  PHYSICAL EXAM: Blood pressure (!) 172/120, pulse 100, height 5\' 5"  (1.651 m), weight 223 lb (101.2 kg), SpO2 95 %. General: No acute distress.  Patient appears well-groomed.  Head:  Normocephalic/atraumatic Eyes:  fundi examined but not visualized Neck: supple, no paraspinal tenderness, full range of motion Back: No paraspinal tenderness Heart: regular rate and rhythm Lungs: Clear to auscultation bilaterally. Vascular: No carotid bruits. Neurological Exam: Mental status: alert and oriented to person, place, and time, recent and remote memory intact, fund of knowledge intact, attention and concentration intact, speech fluent and not dysarthric, language intact. Cranial nerves: CN I: not tested CN II: pupils equal, round and reactive to light, visual fields intact CN III, IV, VI:  full range of motion, no nystagmus, no ptosis CN V: facial sensation intact CN VII: mild flattening of right nasolabial fold CN VIII: hearing intact CN IX, X: gag intact, uvula midline CN XI: sternocleidomastoid and trapezius muscles intact CN XII:  tongue midline Bulk & Tone: normal, no fasciculations. Motor:  5-/5 right hand grip.  Give-way weakness of right hip flexion.  Otherwise, 5/5 throughout Sensation:  Pinprick and vibration sensation intact. Deep Tendon Reflexes:  2+ throughout, toes downgoing.  Finger to nose testing:  Without dysmetria.  Heel to shin:  Without dysmetria.  Gait:  Normal station and stride.  Able to turn.  Tandem walk deferred. Romberg negative.Marland Kitchen.  IMPRESSION: 1.  Chronic migraine with and without aura, with status migrainosus, intractable 2.  History of CVA 3.  Hypertension  She has tried and failed multiple preventatives including beta-blockers, antiepileptic medications, antidepressants, Botox and 2 CGRP inhibitor autoinjectors.  The only options I can recommend are topiramate ER (as it less likely causes cognitive deficits than immediate-release) or  Vyepti.  Both will be difficult to obtain prior authorization from [facility] but there really aren't any other options.  We will first try to obtain prior-authorization for topiramate ER.  Abortive therapy options are limited as well.  Triptans are contraindicated given her history of stroke.  She has failed multiple NSAIDs, over the counter analgesics (such as Excedrin and acetaminophen) and tramadol.  The only reasonable option at this time would be an abortive CGRP inhibitor (such as Nurtec or Ubrelvy) or Reyvow.  This, too, will be difficult to obtain prior [facility] but there are really no other options.  PLAN: 1.  For preventative therapy, will start topiramate ER 50mg  at bedtime.   2.  For abortive therapy, she has samples of both Nurtec and Ubrelvy.  She will try both and let us know if she has a preference and will then send a prescription for prior authorization. 3.  Limit use of pain relievers to no more than 2 days out of week to prevent risk of rebound or medication-overuse headache. 4.  Keep headache diary. 5.  Continue secondary stroke prevention as managed by PCP 6.  Blood pressure elevated today.  Should follow up with PCP. 7.  Follow up with me in 4 months.  Thank you for allowing me to take part in the care of this patient.  Shon MilletAdam Alexica Schlossberg, DO  CC: Salli RealYun Sun, MD

## 2019-10-28 ENCOUNTER — Other Ambulatory Visit: Payer: Self-pay

## 2019-10-28 ENCOUNTER — Encounter: Payer: Self-pay | Admitting: Neurology

## 2019-10-28 ENCOUNTER — Ambulatory Visit (INDEPENDENT_AMBULATORY_CARE_PROVIDER_SITE_OTHER): Payer: Medicaid Other | Admitting: Neurology

## 2019-10-28 VITALS — BP 172/120 | HR 100 | Ht 65.0 in | Wt 223.0 lb

## 2019-10-28 DIAGNOSIS — I10 Essential (primary) hypertension: Secondary | ICD-10-CM

## 2019-10-28 DIAGNOSIS — G43111 Migraine with aura, intractable, with status migrainosus: Secondary | ICD-10-CM | POA: Diagnosis not present

## 2019-10-28 DIAGNOSIS — G43711 Chronic migraine without aura, intractable, with status migrainosus: Secondary | ICD-10-CM

## 2019-10-28 MED ORDER — TOPIRAMATE ER 50 MG PO CAP24: 50.0000 mg | ORAL_CAPSULE | Freq: Every day | ORAL | 3 refills | Status: DC

## 2019-10-28 NOTE — Patient Instructions (Signed)
1.  I want to try and start you on extended release topiramate.  Start 50mg  at bedtime.  Will need to get approval from Medicaid. 2.  When you get a migraine, take one Nurtec at earliest onset.  Only one in 24 hours. 3.  I will also give you a sample of Ubrelvy.  For a different headache, take 1 pill at earliest onset and may repeat once after 2 hours if needed (maximum 2 pills in 24 hours) Contact me with medication that works best and I will try to get it approved by Medicaid. 4.  Limit use of pain relievers to no more than 2 days out of week to prevent risk of rebound or medication-overuse headache. 5.  Keep headache diary 6.  Follow up in 4 months.

## 2019-11-05 ENCOUNTER — Other Ambulatory Visit: Payer: Self-pay

## 2019-11-05 ENCOUNTER — Telehealth: Payer: Self-pay | Admitting: Neurology

## 2019-11-05 MED ORDER — TOPIRAMATE ER 50 MG PO CAP24: 50.0000 mg | ORAL_CAPSULE | Freq: Every day | ORAL | 3 refills | Status: DC

## 2019-11-05 NOTE — Telephone Encounter (Signed)
Patient called in regarding her Topamax medication and it needing a Prior Auth. She uses [facility] on [address] and [address]. Thank you

## 2019-11-05 NOTE — Telephone Encounter (Signed)
I sent request to pharmacy, cover my meds will send authorization to start authorization

## 2019-11-11 NOTE — Telephone Encounter (Signed)
Patient called and said the pharmacy still does not have her approval for her Topamax. She is unsure of the dosage.   Patient also said the new medication (unsure of name) she recently starting taking for headaches is not helping at all.   Patient has a bad headache right now.  [facility] on [address] and [address]

## 2019-11-12 NOTE — Telephone Encounter (Signed)
Spoke with pt informed her that we are trying to get her medication approved (Patricia [name]/MEDICAID will not approve TROKENDI XR ), she stated that she is still having really bed headaches nothing is helping, she is asking what can she do to help with her headaches?

## 2019-11-13 ENCOUNTER — Other Ambulatory Visit: Payer: Self-pay | Admitting: Neurology

## 2019-11-13 MED ORDER — TOPIRAMATE ER 50 MG PO SPRINKLE CAP24: 50.0000 mg | EXTENDED_RELEASE_CAPSULE | Freq: Every day | ORAL | 5 refills | Status: DC

## 2019-11-13 NOTE — Telephone Encounter (Signed)
Pt called and notified

## 2019-11-13 NOTE — Telephone Encounter (Signed)
I will have Patricia [name] check with prior authorization for Trokendi, but Medicaid may not be able to cover it.   I gave her 2 rescue medications to try Patricia [name] and Nurtec).  If they are both not effective, then I unfortunately have nothing else to offer because she has already tried and failed the medications that I typically prescribe and other alternatives will not be covered by Medicaid.  I think her PCP really needs to refer her to a headache specialist.

## 2019-11-13 NOTE — Telephone Encounter (Signed)
Please let patient know: I found out that Medicaid will cover a generic extended release topiramate.  I sent a prescription for 50mg  at bedtime to a mail order pharmacy in [place], called [facility].  They should be contacting her.

## 2019-11-13 NOTE — Telephone Encounter (Signed)
Trokendi XR is not covered by Medicaid.  I will find out if Qudexy, which is another extended release topiramate, is covered.  Otherwise, I would recommend trying Ajovy, which is a another monthly injection. As for rescue therapy:  If she has failed both Ubrelvy and Nurtec, she can try Reyvow.  It should be covered by Medicaid if she has failed at least 2 triptans, which she has.  I would like her to come by the office to pick up samples and if effective, I can place an order for prior authorization.         Reyvow 100mg  tablet.  She should first try taking 1 tablet at onset of migraine (once daily as needed).  If this dose is ineffective, then for next migraine, she may take 2 tablets at onset of migraine (once daily as needed).  Due to potential drowsiness, it is recommended not to drive for 8 hours after taken.

## 2019-11-13 NOTE — Telephone Encounter (Signed)
Please see note thanks,

## 2019-11-14 NOTE — Telephone Encounter (Signed)
Patient called.

## 2019-11-28 ENCOUNTER — Emergency Department (HOSPITAL_COMMUNITY)
Admission: EM | Admit: 2019-11-28 | Discharge: 2019-11-28 | Disposition: A | Discharge status: Home / Self Care | Payer: Medicaid Other | Attending: Emergency Medicine | Admitting: Emergency Medicine

## 2019-11-28 ENCOUNTER — Other Ambulatory Visit: Payer: Self-pay

## 2019-11-28 ENCOUNTER — Encounter (HOSPITAL_COMMUNITY): Payer: Self-pay | Admitting: Emergency Medicine

## 2019-11-28 ENCOUNTER — Emergency Department (HOSPITAL_COMMUNITY): Payer: Medicaid Other

## 2019-11-28 DIAGNOSIS — I10 Essential (primary) hypertension: Secondary | ICD-10-CM | POA: Insufficient documentation

## 2019-11-28 DIAGNOSIS — E669 Obesity, unspecified: Secondary | ICD-10-CM | POA: Insufficient documentation

## 2019-11-28 DIAGNOSIS — Z79899 Other long term (current) drug therapy: Secondary | ICD-10-CM | POA: Diagnosis not present

## 2019-11-28 DIAGNOSIS — Z7984 Long term (current) use of oral hypoglycemic drugs: Secondary | ICD-10-CM | POA: Diagnosis not present

## 2019-11-28 DIAGNOSIS — J1282 Pneumonia due to coronavirus disease 2019: Secondary | ICD-10-CM

## 2019-11-28 DIAGNOSIS — E119 Type 2 diabetes mellitus without complications: Secondary | ICD-10-CM | POA: Insufficient documentation

## 2019-11-28 DIAGNOSIS — Z6837 Body mass index (BMI) 37.0-37.9, adult: Secondary | ICD-10-CM | POA: Diagnosis not present

## 2019-11-28 DIAGNOSIS — R05 Cough: Secondary | ICD-10-CM | POA: Diagnosis not present

## 2019-11-28 DIAGNOSIS — U071 COVID-19: Secondary | ICD-10-CM | POA: Insufficient documentation

## 2019-11-28 DIAGNOSIS — G43009 Migraine without aura, not intractable, without status migrainosus: Secondary | ICD-10-CM | POA: Diagnosis not present

## 2019-11-28 DIAGNOSIS — R509 Fever, unspecified: Secondary | ICD-10-CM | POA: Diagnosis present

## 2019-11-28 LAB — BASIC METABOLIC PANEL
Anion gap: 10 (ref 5–15)
BUN: 20 mg/dL (ref 6–20)
CO2: 24 mmol/L (ref 22–32)
Calcium: 8.5 mg/dL — ABNORMAL LOW (ref 8.9–10.3)
Chloride: 103 mmol/L (ref 98–111)
Creatinine, Ser: 1.3 mg/dL — ABNORMAL HIGH (ref 0.44–1.00)
GFR calc Af Amer: 55 mL/min — ABNORMAL LOW (ref >60)
GFR calc non Af Amer: 47 mL/min — ABNORMAL LOW (ref >60)
Glucose, Bld: 234 mg/dL — ABNORMAL HIGH (ref 70–99)
Potassium: 3.7 mmol/L (ref 3.5–5.1)
Sodium: 137 mmol/L (ref 135–145)

## 2019-11-28 LAB — CBC
HCT: 44.6 % (ref 36.0–46.0)
Hemoglobin: 14.1 g/dL (ref 12.0–15.0)
MCH: 27.1 pg (ref 26.0–34.0)
MCHC: 31.6 g/dL (ref 30.0–36.0)
MCV: 85.8 fL (ref 80.0–100.0)
Platelets: 170 10*3/uL (ref 150–400)
RBC: 5.2 MIL/uL — ABNORMAL HIGH (ref 3.87–5.11)
RDW: 14 % (ref 11.5–15.5)
WBC: 4.8 10*3/uL (ref 4.0–10.5)
nRBC: 0 % (ref 0.0–0.2)

## 2019-11-28 LAB — POC SARS CORONAVIRUS 2 AG -  ED: SARS Coronavirus 2 Ag: POSITIVE — AB

## 2019-11-28 MED ORDER — SODIUM CHLORIDE 0.9 % IV BOLUS
1000.0000 mL | Freq: Once | INTRAVENOUS | Status: AC
  Administered 2019-11-28: 1000 mL via INTRAVENOUS

## 2019-11-28 MED ORDER — KETOROLAC TROMETHAMINE 30 MG/ML IJ SOLN
30.0000 mg | Freq: Once | INTRAMUSCULAR | Status: AC
  Administered 2019-11-28: 30 mg via INTRAMUSCULAR
  Filled 2019-11-28: qty 1

## 2019-11-28 MED ORDER — DOXYCYCLINE HYCLATE 100 MG PO CAPS: 100.0000 mg | ORAL_CAPSULE | Freq: Two times a day (BID) | ORAL | 0 refills | Status: DC

## 2019-11-28 MED ORDER — METOCLOPRAMIDE HCL 5 MG/ML IJ SOLN
10.0000 mg | Freq: Once | INTRAMUSCULAR | Status: AC
  Administered 2019-11-28: 10 mg via INTRAVENOUS
  Filled 2019-11-28: qty 2

## 2019-11-28 MED ORDER — ACETAMINOPHEN 500 MG PO TABS
1000.0000 mg | ORAL_TABLET | Freq: Once | ORAL | Status: AC
  Administered 2019-11-28: 1000 mg via ORAL
  Filled 2019-11-28: qty 2

## 2019-11-28 MED ORDER — ONDANSETRON 4 MG PO TBDP: 4.0000 mg | ORAL_TABLET | Freq: Three times a day (TID) | ORAL | 0 refills | Status: DC | PRN

## 2019-11-28 MED ORDER — DIPHENHYDRAMINE HCL 50 MG/ML IJ SOLN
25.0000 mg | Freq: Once | INTRAMUSCULAR | Status: AC
  Administered 2019-11-28: 09:00:00 25 mg via INTRAVENOUS
  Filled 2019-11-28: qty 1

## 2019-11-28 MED ORDER — DOXYCYCLINE HYCLATE 100 MG PO TABS
100.0000 mg | ORAL_TABLET | Freq: Once | ORAL | Status: AC
  Administered 2019-11-28: 100 mg via ORAL
  Filled 2019-11-28: qty 1

## 2019-11-28 MED ORDER — HYDROCODONE-ACETAMINOPHEN 5-325 MG PO TABS: 1.0000 | ORAL_TABLET | ORAL | 0 refills | Status: DC | PRN

## 2019-11-28 NOTE — ED Triage Notes (Signed)
Pt in with cough, congestion, HA and fever. States her aunt is currently in hospital w/Covid, and the pt tested 2 days ago, but does not know results yet.

## 2019-11-28 NOTE — ED Provider Notes (Signed)
[name] Provider Note   CSN: [id number] Arrival date & time: 11/28/19  2542     History Chief Complaint  Patient presents with  . Fever  . Cough  . Headache    [name] is a 54 y.o. female.  Pt presents to the ED today with a headache, cough, congestion, and fever.  Pt's aunt has recently been hospitalized for Covid pneumonia.  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  Pt was tested for Covid 2 days ago, but does not have the results.  Pt has a hx of migraines and feels like this headache is a migraine.  She has not been taking her diabetes meds and has not checked her blood sugar in several days.        Past Medical History:  Diagnosis Date  . Anemia    in the past  . Asthma    " a long time ago"  . Bursitis   . Depression    13 years ago -   . Diabetes mellitus without complication (HCC)    type  2  . GERD (gastroesophageal reflux disease)    occ. acid reflux  . Headache    migraines  . Hypertension   . Kidney stones   . PFO (patent foramen ovale)    hx of PFO with surgery done as an adult  . Rotator cuff tear    right  . Stroke [facility]) 2009   lasdt stroke July 2015  . Syncope and collapse     Patient Active Problem List   Diagnosis Date Noted  . Chronic daily headache 06/19/2017  . Intractable chronic migraine without aura and with status migrainosus 09/15/2016  . Depression 08/23/2016  . Cerebrovascular accident (CVA) due to occlusion of left middle cerebral artery (HCC) 07/28/2016  . Acute respiratory failure (HCC)   . Middle cerebral artery stenosis, left 07/24/2016  . Headache 03/23/2016  . Type 2 diabetes mellitus with complication, with long-term current use of insulin (HCC) 09/08/2015  . Hyperglycemia   . TIA (transient ischemic attack)   . AKI (acute kidney injury) (HCC) 02/20/2015  . ARF (acute renal failure) (HCC) 02/20/2015  . Rotator cuff tear 02/04/2015  . Occipital neuralgia 08/27/2014  .  Type 2 diabetes mellitus with complication (HCC) 08/27/2014  . PFO (patent foramen ovale) 06/17/2014  . Hyperlipidemia 06/17/2014  . Morbid obesity, unspecified obesity type (HCC) 06/17/2014  . Acute CVA (cerebrovascular accident) (HCC) 05/22/2014  . Non-compliance 05/22/2014  . History of cardioembolic stroke 05/22/2014  . Status post device closure of ASD 05/22/2014  . HTN (hypertension), benign 05/22/2014  . DM type 2 (diabetes mellitus, type 2) (HCC) 05/22/2014    Past Surgical History:  Procedure Laterality Date  . ABDOMINAL HYSTERECTOMY    . BREAST REDUCTION SURGERY    . CARDIAC CATHETERIZATION    . CARDIAC SURGERY  Closed hole in heart in 2009   Amplatzer Septal Occluder Ref [id number] (not listed in op notes, document scanned under MRI with written op notes  . HYSTEROTOMY    . IR ANGIO INTRA EXTRACRAN SEL COM CAROTID INNOMINATE BILAT MOD SED  04/09/2018  . IR ANGIO VERTEBRAL SEL SUBCLAVIAN INNOMINATE UNI R MOD SED  04/09/2018  . IR GENERIC HISTORICAL  06/23/2016   IR ANGIO INTRA EXTRACRAN SEL COM CAROTID INNOMINATE BILAT MOD SED 06/23/2016 Patricia Cotton, [name]  . IR GENERIC HISTORICAL  06/23/2016   IR ANGIO VERTEBRAL SEL VERTEBRAL BILAT MOD SED 06/23/2016  Patricia Bras, [name]  . IR GENERIC HISTORICAL  06/29/2016   IR RADIOLOGIST EVAL & MGMT 06/29/2016 [name]  . IR GENERIC HISTORICAL  07/24/2016   IR INTRA CRAN STENT 07/24/2016 Patricia Bras, [name]  . IR GENERIC HISTORICAL  07/28/2016   IR PTA INTRACRANIAL 07/28/2016 Patricia Bras, [name]  . IR GENERIC HISTORICAL  07/28/2016   IR ENDOVASC INTRACRANIAL INF OTHER THAN THROMBO ART INC DIAG ANGIO 07/28/2016 Patricia Bras, [name]  . IR GENERIC HISTORICAL  08/07/2016   IR RADIOLOGIST EVAL & MGMT 08/07/2016 [name]  . IR GENERIC HISTORICAL  12/05/2016   IR ANGIO INTRA EXTRACRAN SEL COM CAROTID INNOMINATE BILAT MOD SED 12/05/2016 Patricia Bras, [name]  .  IR GENERIC HISTORICAL  12/05/2016   IR ANGIO VERTEBRAL SEL SUBCLAVIAN INNOMINATE UNI R MOD SED 12/05/2016 Patricia Bras, [name]  . IR GENERIC HISTORICAL  12/05/2016   IR ANGIO VERTEBRAL SEL VERTEBRAL UNI L MOD SED 12/05/2016 Patricia Bras, [name]  . RADIOLOGY WITH ANESTHESIA [name] 07/24/2016   Procedure: Stenting;  Surgeon: Patricia Bras, [name];  [facility]: [name];  Service: Radiology;  Laterality: [name];  . RADIOLOGY WITH ANESTHESIA [name] 07/28/2016   Procedure: RADIOLOGY WITH ANESTHESIA;  Surgeon: [name];  [facility]: [name];  Service: Radiology;  Laterality: [name];  . REDUCTION MAMMAPLASTY Bilateral   . SHOULDER ARTHROSCOPY WITH OPEN ROTATOR CUFF REPAIR AND DISTAL CLAVICLE ACROMINECTOMY Right 02/04/2015   Procedure: SHOULDER ARTHROSCOPY WITH SUBACROMIAL DECOMPRESSION AND OPEN ROTATOR CUFF REPAIR,;  Surgeon: Patricia Pel, [name];  Location: [facility];  Service: Orthopedics;  Laterality: Right;  RIGHT SHOULDER DOA,SAD,MINI-OPEN ROTATOR CUFF TEAR REPAIR.     OB History   No obstetric history on file.     Family History  Problem Relation Age of Onset  . Hypertension Mother   . Hypertension Father   . Cancer Maternal Aunt   . Breast cancer Maternal Aunt   . Cancer Maternal Uncle     Social History   Tobacco Use  . Smoking status: Never Smoker  . Smokeless tobacco: Never Used  Substance Use Topics  . Alcohol use: Yes    Alcohol/week: 0.0 standard drinks    Comment: occasional  . Drug use: No    Home Medications Prior to Admission medications   Patricia [facility] Start Date End Date Taking? Authorizing Provider  BRILINTA 90 MG TABS tablet Take 90 mg by mouth 2 (two) times daily.  01/15/18   [name]  chlorthalidone (HYGROTON) 25 MG tablet Take 25 mg by mouth at bedtime.  09/03/15   [name]  doxycycline (VIBRAMYCIN) 100 MG capsule Take 1 capsule (100 mg total) by mouth 2 (two) times daily. 11/28/19   [name]    empagliflozin (JARDIANCE) 25 MG TABS tablet Take 25 mg by mouth daily.    [name]  HYDROcodone-acetaminophen (NORCO/VICODIN) 5-325 MG tablet Take 1 tablet by mouth every 4 (four) hours as needed. 11/28/19   [name]  metFORMIN (GLUCOPHAGE) 1000 MG tablet Take 1,000 mg by mouth 2 (two) times daily with a meal.    [name]  ondansetron (ZOFRAN ODT) 4 MG disintegrating tablet Take 1 tablet (4 mg total) by mouth every 8 (eight) hours as needed. 11/28/19   [name]  pravastatin (PRAVACHOL) 80 MG tablet Take 80 mg by mouth at bedtime.     [name]  promethazine (PHENERGAN) 25 MG tablet Take  25 mg by mouth every 4 (four) hours as needed for nausea or vomiting.    [name]  Rimegepant Sulfate (NURTEC) 75 MG TBDP Take by mouth.    [name]  tiZANidine (ZANAFLEX) 4 MG tablet Take 4 mg by mouth every 6 (six) hours as needed for muscle spasms.    [name]  topiramate ER (QUDEXY XR) 50 MG CS24 sprinkle capsule Take 1 capsule (50 mg total) by mouth at bedtime. 11/13/19   Drema Dallas, DO    Allergies    Imitrex [sumatriptan], Triptans, Plavix [clopidogrel bisulfate], Cymbalta [duloxetine hcl], Lipitor [atorvastatin], and Topamax [topiramate]  Review of Systems   Review of Systems  Constitutional: Positive for chills and fever.  Respiratory: Positive for cough.   Neurological: Positive for headaches.  All other systems reviewed and are negative.   Physical Exam Updated Vital Signs BP (!) 136/101   Pulse 96   Temp (!) 100.5 F (38.1 C) (Oral)   Wt 101.2 kg   SpO2 96%   BMI 37.13 kg/m   Physical Exam Vitals and nursing note reviewed.  Constitutional:      Appearance: She is well-developed. She is obese.  HENT:     Head: Normocephalic and atraumatic.     Mouth/Throat:     Mouth: Mucous membranes are moist.     Pharynx: Oropharynx is clear.  Eyes:     Extraocular Movements:  Extraocular movements intact.     Pupils: Pupils are equal, round, and reactive to light.  Cardiovascular:     Rate and Rhythm: Normal rate and regular rhythm.  Pulmonary:     Effort: Pulmonary effort is normal.     Breath sounds: Normal breath sounds.  Abdominal:     General: Bowel sounds are normal.     Palpations: Abdomen is soft.  Musculoskeletal:        General: Normal range of motion.     Cervical back: Normal range of motion and neck supple.  Skin:    General: Skin is warm.     Capillary Refill: Capillary refill takes less than 2 seconds.  Neurological:     Mental Status: She is alert and oriented to person, place, and time.  Psychiatric:        Mood and Affect: Mood normal.        Speech: Speech normal.        Behavior: Behavior normal.     ED Results / Procedures / Treatments   Labs (all labs ordered are listed, but only abnormal results are displayed) Labs Reviewed  CBC - Abnormal; Notable for the following components:      Result Value   RBC 5.20 (*)    All other components within normal limits  BASIC METABOLIC PANEL - Abnormal; Notable for the following components:   Glucose, Bld 234 (*)    Creatinine, Ser 1.30 (*)    Calcium 8.5 (*)    GFR calc non Af Amer 47 (*)    GFR calc Af Amer 55 (*)    All other components within normal limits  POC SARS CORONAVIRUS 2 AG -  ED - Abnormal; Notable for the following components:   SARS Coronavirus 2 Ag POSITIVE (*)    All other components within normal limits    EKG None  Radiology DG Chest Portable 1 View  Result Date: 11/28/2019 CLINICAL DATA:  Fever cough, headache, COVID-19 exposure EXAM: PORTABLE CHEST 1 VIEW COMPARISON:  03/06/2018 FINDINGS: Single frontal view of  the chest demonstrates a stable cardiac silhouette. Ectasia of the thoracic aorta unchanged. Faint bibasilar ground-glass airspace disease. No effusion or pneumothorax. No acute bony abnormalities., IMPRESSION: 1. Faint bibasilar ground-glass  opacities, differential includes hypoventilatory changes versus developing atypical viral pneumonia. Electronically Signed   By: Sharlet Salina M.D.   On: 11/28/2019 08:53    Procedures Procedures (including critical care time)  Medications Ordered in ED Medications  doxycycline (VIBRA-TABS) tablet 100 mg (has no administration in time range)  acetaminophen (TYLENOL) tablet 1,000 mg (has no administration in time range)  sodium chloride 0.9 % bolus 1,000 mL (0 mLs Intravenous Stopped 11/28/19 1049)  ketorolac (TORADOL) 30 MG/ML injection 30 mg (30 mg Intramuscular Given 11/28/19 0858)  metoCLOPramide (REGLAN) injection 10 mg (10 mg Intravenous Given 11/28/19 0858)  diphenhydrAMINE (BENADRYL) injection 25 mg (25 mg Intravenous Given 11/28/19 7673)    ED Course  I have reviewed the triage vital signs and the nursing notes.  Pertinent labs & imaging results that were available during my care of the patient were reviewed by me and considered in my medical decision making (see chart for details).    MDM Rules/Calculators/A&P                      Pt does have Covid with some pneumonia.  Her oxygenation is good and she looks nontoxic.  Pt said she still has a headache, but is feeling well enough to go home.  She just wants to try to sleep off her migraine.  She knows to return if sx worsen.  F/u with her pcp.  [name] was evaluated in Emergency Department on 11/28/2019 for the symptoms described in the history of present illness. She was evaluated in the context of the global COVID-19 pandemic, which necessitated consideration that the patient might be at risk for infection with the SARS-CoV-2 virus that causes COVID-19. Institutional protocols and algorithms that pertain to the evaluation of patients at risk for COVID-19 are in a state of rapid change based on information released by regulatory bodies including the CDC and federal and state organizations. These policies and algorithms were  followed during the patient's care in the ED. Final Clinical Impression(s) / ED Diagnoses Final diagnoses:  Migraine without aura and without status migrainosus, not intractable  Pneumonia due to COVID-19 virus    Rx / DC Orders ED Discharge Orders         Ordered    doxycycline (VIBRAMYCIN) 100 MG capsule  2 times daily     11/28/19 1027    HYDROcodone-acetaminophen (NORCO/VICODIN) 5-325 MG tablet  Every 4 hours PRN     11/28/19 1027    ondansetron (ZOFRAN ODT) 4 MG disintegrating tablet  Every 8 hours PRN     11/28/19 1027           Jacalyn Lefevre, [name] 11/28/19 1055

## 2019-11-28 NOTE — ED Notes (Signed)
Patient given discharge instructions patient verbalizes understanding. 

## 2019-11-29 ENCOUNTER — Other Ambulatory Visit: Payer: Self-pay | Admitting: Nurse Practitioner

## 2019-11-29 ENCOUNTER — Telehealth: Payer: Self-pay | Admitting: Nurse Practitioner

## 2019-11-29 DIAGNOSIS — E118 Type 2 diabetes mellitus with unspecified complications: Secondary | ICD-10-CM

## 2019-11-29 DIAGNOSIS — G459 Transient cerebral ischemic attack, unspecified: Secondary | ICD-10-CM

## 2019-11-29 DIAGNOSIS — I10 Essential (primary) hypertension: Secondary | ICD-10-CM

## 2019-11-29 DIAGNOSIS — U071 COVID-19: Secondary | ICD-10-CM

## 2019-11-29 NOTE — Progress Notes (Signed)
  I connected by phone with [name] on 11/29/2019 at 10:15 AM to discuss the potential use of an new treatment for mild to moderate COVID-19 viral infection in non-hospitalized patients.  This patient is a 54 y.o. female that meets the FDA criteria for Emergency Use Authorization of bamlanivimab or casirivimab\imdevimab.  Has a (+) direct SARS-CoV-2 viral test result  Has mild or moderate COVID-19   Is ? 54 years of age and weighs ? 40 kg  Is NOT hospitalized due to COVID-19  Is NOT requiring oxygen therapy or requiring an increase in baseline oxygen flow rate due to COVID-19  Is within 10 days of symptom onset  Has at least one of the high risk factor(s) for progression to severe COVID-19 and/or hospitalization as defined in EUA.  Specific high risk criteria : Diabetes, hypertension, obesity   I have spoken and communicated the following to the patient or parent/caregiver:  1. FDA has authorized the emergency use of bamlanivimab and casirivimab\imdevimab for the treatment of mild to moderate COVID-19 in adults and pediatric patients with positive results of direct SARS-CoV-2 viral testing who are 74 years of age and older weighing at least 40 kg, and who are at high risk for progressing to severe COVID-19 and/or hospitalization.  2. The significant known and potential risks and benefits of bamlanivimab and casirivimab\imdevimab, and the extent to which such potential risks and benefits are unknown.  3. Information on available alternative treatments and the risks and benefits of those alternatives, including clinical trials.  4. Patients treated with bamlanivimab and casirivimab\imdevimab should continue to self-isolate and use infection control measures (e.g., wear mask, isolate, social distance, avoid sharing personal items, clean and disinfect "high touch" surfaces, and frequent handwashing) according to CDC guidelines.   5. The patient or parent/caregiver has the  option to accept or refuse bamlanivimab or casirivimab\imdevimab .  After reviewing this information with the patient, The patient agreed to proceed with receiving the bamlanimivab infusion and will be provided a copy of the Fact sheet prior to receiving the infusion.[name] 11/29/2019 10:15 AM

## 2019-11-29 NOTE — Telephone Encounter (Signed)
Called to discuss with Porfirio Oar about Covid symptoms and the use of bamlanivimab, a monoclonal antibody infusion for those with mild to moderate Covid symptoms and at a high risk of hospitalization.     Pt is qualified for this infusion at the [facility] infusion center due to co-morbid conditions and/or a member of an at-risk group.  Patient verbalized understanding of treatment and has requested an appointment. She is scheduled for Monday 12/01/19 @ 1030 as requested.   Symptoms tier reviewed as well as criteria for ending isolation.  Symptoms reviewed that would warrant ED/Hospital evaluation. Preventative practices reviewed. Patient verbalized understanding. Patient advised to go to Urgent care or ED with severe symptoms.  Mychart message also sent.   Patient Active Problem List   Diagnosis Date Noted  . Chronic daily headache 06/19/2017  . Intractable chronic migraine without aura and with status migrainosus 09/15/2016  . Depression 08/23/2016  . Cerebrovascular accident (CVA) due to occlusion of left middle cerebral artery (HCC) 07/28/2016  . Acute respiratory failure (HCC)   . Middle cerebral artery stenosis, left 07/24/2016  . Headache 03/23/2016  . Type 2 diabetes mellitus with complication, with long-term current use of insulin (HCC) 09/08/2015  . Hyperglycemia   . TIA (transient ischemic attack)   . AKI (acute kidney injury) (HCC) 02/20/2015  . ARF (acute renal failure) (HCC) 02/20/2015  . Rotator cuff tear 02/04/2015  . Occipital neuralgia 08/27/2014  . Type 2 diabetes mellitus with complication (HCC) 08/27/2014  . PFO (patent foramen ovale) 06/17/2014  . Hyperlipidemia 06/17/2014  . Morbid obesity, unspecified obesity type (HCC) 06/17/2014  . Acute CVA (cerebrovascular accident) (HCC) 05/22/2014  . Non-compliance 05/22/2014  . History of cardioembolic stroke 05/22/2014  . Status post device closure of ASD 05/22/2014  . HTN (hypertension), benign 05/22/2014   . DM type 2 (diabetes mellitus, type 2) (HCC) 05/22/2014    Willette Alma, AGPCNP-BC Pager: [phone] Amion: N. Cousar

## 2019-12-01 ENCOUNTER — Ambulatory Visit (HOSPITAL_COMMUNITY): Payer: Medicaid Other

## 2019-12-19 ENCOUNTER — Other Ambulatory Visit: Payer: Self-pay | Admitting: Neurology

## 2019-12-19 ENCOUNTER — Telehealth: Payer: Self-pay | Admitting: Neurology

## 2019-12-19 MED ORDER — UBRELVY 100 MG PO TABS: 1.0000 | ORAL_TABLET | ORAL | 11 refills | Status: DC | PRN

## 2019-12-19 NOTE — Telephone Encounter (Signed)
I she can get here by 4, then she may pick up some samples of Ubrelvy.  I will place a prescription for Ubrelvy.  It will need prior authorization, however.  She may be eligible for their discount program, called the U-Save Program.  To check eligibility, she can text UBRELVY to [phone] or go to the website [email or web address].

## 2019-12-19 NOTE — Telephone Encounter (Signed)
[name], and she is contacting pharmacy

## 2019-12-19 NOTE — Telephone Encounter (Signed)
Patient aware and coming to the office to get samples

## 2019-12-19 NOTE — Telephone Encounter (Signed)
Left message to call office back

## 2019-12-19 NOTE — Telephone Encounter (Signed)
She needs to tell us which medication helped her [name] or Nurtec). Regarding the topiramate, she should contact the pharmacy at [phone]

## 2019-12-19 NOTE — Telephone Encounter (Signed)
Patient called in regarding needing to pick up samples if the office has any for her headache. She said it has helped her in the past. She also mentioned that her prescription for Topamax that was sent through mail order she has still not received it. Please Call. Thanks

## 2019-12-22 ENCOUNTER — Telehealth: Payer: Self-pay | Admitting: Neurology

## 2019-12-22 NOTE — Telephone Encounter (Signed)
Called [place] [employer]- waiting to determination- PA # [id number] REF #: [id number]. Will call back within 24 hours to check status.

## 2019-12-29 NOTE — Telephone Encounter (Signed)
Patient's PA was denied- submitted a new PA for patient [name]. Will call to check status in 24 hours. New PA #: [id number]. Ref #: [id number].

## 2020-01-01 NOTE — Telephone Encounter (Signed)
Called [place] [employer] and [place] #: [id number] was approved on 12/30/19 through 06/27/20. Ref #: [id number].

## 2020-01-16 ENCOUNTER — Ambulatory Visit: Payer: Medicaid Other

## 2020-01-27 ENCOUNTER — Emergency Department (HOSPITAL_COMMUNITY)
Admission: EM | Admit: 2020-01-27 | Discharge: 2020-01-27 | Disposition: A | Discharge status: Home / Self Care | Payer: Medicaid Other | Attending: Emergency Medicine | Admitting: Emergency Medicine

## 2020-01-27 ENCOUNTER — Telehealth: Payer: Self-pay | Admitting: Neurology

## 2020-01-27 ENCOUNTER — Encounter (HOSPITAL_COMMUNITY): Payer: Self-pay | Admitting: Pediatrics

## 2020-01-27 ENCOUNTER — Emergency Department (HOSPITAL_COMMUNITY): Payer: Medicaid Other

## 2020-01-27 ENCOUNTER — Other Ambulatory Visit: Payer: Self-pay

## 2020-01-27 DIAGNOSIS — R519 Headache, unspecified: Secondary | ICD-10-CM | POA: Insufficient documentation

## 2020-01-27 DIAGNOSIS — Z5321 Procedure and treatment not carried out due to patient leaving prior to being seen by health care provider: Secondary | ICD-10-CM | POA: Diagnosis not present

## 2020-01-27 LAB — I-STAT CHEM 8, ED
BUN: 16 mg/dL (ref 6–20)
Calcium, Ion: 1.25 mmol/L (ref 1.15–1.40)
Chloride: 106 mmol/L (ref 98–111)
Creatinine, Ser: 1.1 mg/dL — ABNORMAL HIGH (ref 0.44–1.00)
Glucose, Bld: 128 mg/dL — ABNORMAL HIGH (ref 70–99)
HCT: 46 % (ref 36.0–46.0)
Hemoglobin: 15.6 g/dL — ABNORMAL HIGH (ref 12.0–15.0)
Potassium: 4.1 mmol/L (ref 3.5–5.1)
Sodium: 141 mmol/L (ref 135–145)
TCO2: 28 mmol/L (ref 22–32)

## 2020-01-27 LAB — COMPREHENSIVE METABOLIC PANEL
ALT: 19 U/L (ref 0–44)
AST: 20 U/L (ref 15–41)
Albumin: 3.7 g/dL (ref 3.5–5.0)
Alkaline Phosphatase: 128 U/L — ABNORMAL HIGH (ref 38–126)
Anion gap: 9 (ref 5–15)
BUN: 13 mg/dL (ref 6–20)
CO2: 25 mmol/L (ref 22–32)
Calcium: 9.6 mg/dL (ref 8.9–10.3)
Chloride: 105 mmol/L (ref 98–111)
Creatinine, Ser: 1.05 mg/dL — ABNORMAL HIGH (ref 0.44–1.00)
GFR calc Af Amer: >60 mL/min (ref >60)
GFR calc non Af Amer: >60 mL/min (ref >60)
Glucose, Bld: 132 mg/dL — ABNORMAL HIGH (ref 70–99)
Potassium: 4.5 mmol/L (ref 3.5–5.1)
Sodium: 139 mmol/L (ref 135–145)
Total Bilirubin: 0.5 mg/dL (ref 0.3–1.2)
Total Protein: 7.5 g/dL (ref 6.5–8.1)

## 2020-01-27 LAB — I-STAT BETA HCG BLOOD, ED (MC, WL, AP ONLY): I-stat hCG, quantitative: 5.5 m[IU]/mL — ABNORMAL HIGH (ref <5)

## 2020-01-27 LAB — CBC
HCT: 45.8 % (ref 36.0–46.0)
Hemoglobin: 14.2 g/dL (ref 12.0–15.0)
MCH: 27.3 pg (ref 26.0–34.0)
MCHC: 31 g/dL (ref 30.0–36.0)
MCV: 87.9 fL (ref 80.0–100.0)
Platelets: 255 10*3/uL (ref 150–400)
RBC: 5.21 MIL/uL — ABNORMAL HIGH (ref 3.87–5.11)
RDW: 14.6 % (ref 11.5–15.5)
WBC: 5.9 10*3/uL (ref 4.0–10.5)
nRBC: 0 % (ref 0.0–0.2)

## 2020-01-27 LAB — PROTIME-INR
INR: 0.9 (ref 0.8–1.2)
Prothrombin Time: 11.6 seconds (ref 11.4–15.2)

## 2020-01-27 LAB — DIFFERENTIAL
Abs Immature Granulocytes: 0.02 10*3/uL (ref 0.00–0.07)
Basophils Absolute: 0 10*3/uL (ref 0.0–0.1)
Basophils Relative: 0 %
Eosinophils Absolute: 0.1 10*3/uL (ref 0.0–0.5)
Eosinophils Relative: 2 %
Immature Granulocytes: 0 %
Lymphocytes Relative: 33 %
Lymphs Abs: 2 10*3/uL (ref 0.7–4.0)
Monocytes Absolute: 0.4 10*3/uL (ref 0.1–1.0)
Monocytes Relative: 7 %
Neutro Abs: 3.4 10*3/uL (ref 1.7–7.7)
Neutrophils Relative %: 58 %

## 2020-01-27 LAB — APTT: aPTT: 28 seconds (ref 24–36)

## 2020-01-27 LAB — CBG MONITORING, ED: Glucose-Capillary: 137 mg/dL — ABNORMAL HIGH (ref 70–99)

## 2020-01-27 MED ORDER — SODIUM CHLORIDE 0.9% FLUSH: 3.0000 mL | Freq: Once | INTRAVENOUS | Status: DC

## 2020-01-27 NOTE — Telephone Encounter (Signed)
Patient called complaining of a headache.  She is currently at the hospital and has had a normal CT.  Advised patient to stay at the hospital and to continue to get care there.  Patient hung up without a response.

## 2020-01-27 NOTE — ED Triage Notes (Signed)
C/o headache and facial droop stated started last night; endorsed hx of stroke.

## 2020-02-23 ENCOUNTER — Ambulatory Visit: Payer: Medicaid Other | Attending: Internal Medicine

## 2020-02-23 DIAGNOSIS — Z23 Encounter for immunization: Secondary | ICD-10-CM

## 2020-02-23 NOTE — Progress Notes (Signed)
   Covid-19 Vaccination Clinic  Name:  [name]    MRN: [id number] DOB: Jun 10, 1966  02/23/2020  [name] was observed post Covid-19 immunization for 30 minutes based on pre-vaccination screening without incident. She was provided with Vaccine Information Sheet and instruction to access the V-Safe system.   [name] was instructed to call 911 with any severe reactions post vaccine: Marland Kitchen Difficulty breathing  . Swelling of face and throat  . A fast heartbeat  . A bad rash all over body  . Dizziness and weakness   Immunizations Administered    Name Date Dose VIS Date Route   Pfizer COVID-19 Vaccine 02/23/2020  2:06 PM 0.3 mL 12/17/2018 Intramuscular   Manufacturer: [employer], [employer]   Lot: [id number]   NDC: [address]-2

## 2020-02-24 NOTE — Progress Notes (Signed)
NEUROLOGY FOLLOW UP OFFICE NOTE  Patricia [name] [id number]  HISTORY OF PRESENT ILLNESS: Patricia [name] is a 54 year old right-handed black female with HTN, diabetes, HLD and history of stroke who follows up for migraines.  UPDATE: Started topiramate ER in addition to [employer].  She never picked up the topiramate ER but Mail order was never sent to the house.  Didn't pick up the this past month's  Ubrelvy and Nurtec ineffective.  Reyvow helped.  She took it, went to sleep and when she woke up, headache was gone.   Headaches are severe, lasting up to 5 days.  Almost daily headaches.  Went to the ED on 01/27/2020 for intractable headache with facial droop (not new).  CT head was negative. Current NSAIDS:  none Current  analgesics:  Acetaminophen-diphennhdramine, Excedrin Current triptans:  Contraindicated (CVA) Current ergotamine:  Contraindicated (CVA) Current anti-emetic:  Promethazine 25mg  Current muscle relaxants:  Tizanidine 4mg  Current anti-anxiolytic:  none Current sleep aide:  none Current Antihypertensive medications:  none Current Antidepressant medications:  none Current Anticonvulsant medications:  topiramate ER 50mg  daily Current anti-CGRP:  Emgality (s/p 5th shot.  Ineffective); Roselyn Meier 100mg  Current Vitamins/Herbal/Supplements:  Magnesium oxide 400mg  daily Current Antihistamines/Decongestants:  Diphenhydramine PRN Other therapy:  none Hormone/birth control:  none  Caffeine:  1 cup of coffee once a week at most Alcohol:  rarely Smoker:  no Diet:  Hydrates Exercise:  no Depression:  mild; Anxiety:  Mild.  Currently without a job and needed to move in with her daughter. Other pain:  Hands/back pain Sleep hygiene:  Sleeps well in recliner (due to back pain)  HISTORY: She has past history of bi-hemispheric cardioembolic CVA secondary to PFO/ASD s/p closure in 2009 and left MCA territory stroke secondary to left MCA stenosis s/p stent in 2017.  Other  pertinent stroke workup included negative vasculitis and hypercoagulable panel.  She has had recurrent episodes of speech difficulty.  Repeat MRIs and EEG were negative.   She started having migraines at 54 years old.  They may be a severe pounding pain that occurs behind right eye or right occipital region.  They are associated with nausea, vomiting, osmophobia, dizziness and sometimes visual aura (objects in vision seem tilted) as well as right facial weakness and speech disturbance.  They typically last 2 days to 1 week.  20 headache days a month on average.  Triggers include certain smells (scented lotions) or change in weather.  No relieving factors.  She has been followed by headache specialist at [facility] as well as at [facility] but treatment has been ineffective.  Takes a pain reliever once a week on-average (ineffective)   Past NSAIDS:  Ibuprofen, naproxen Past analgesics:  Percocet, Tylenol, Excedrin, tramadol Past abortive triptans:  Sumatriptan (side effects) Past abortive ergotamine:  none Past muscle relaxants:  none Past anti-emetic:  none Past antihypertensive medications:  Propranolol (ineffective) Past antidepressant medications:  Amitriptyline (ineffective), venlafaxine (ineffective), sertraline 50mg  Past anticonvulsant medications:  topiramate (cognitive deficits), Depakote (ineffective), gabapentin (ineffective), Keppra (ineffective), Lamictal (ineffective), Lyrica (ineffective), zonisamide 100mg  daily Past anti-CGRP:  Aimovig (ineffective) Past vitamins/Herbal/Supplements:  none Past antihistamines/decongestants:  none Other past therapies:  Botox   Family history of headache:  Son, grandson  Imaging: 05/22/2014 MRI/MRA of head: Multiple acute infarcts in left hemisphere.  Old infarctions affecting the thalami, hemispheric white matter, genu of corpus callosum and right parietal cortex at the vertex.  Advanced intracranial disease, particularly severe at the M1 segment of  left MCA showing 90% stenosis.  07/03/2014 CTA Head & Neck: Severe proximal left MCA stenosis.  Mild proximal right MCA stenosis.  Bilateral MCA branch vessel irregularity and attenuation.  Occlusion of the anterior cerebral arteries at their origins with some reconstituted but severely diminished flow distally.  Mild left PT stenosis.  No evidence of cervical carotid or vertebral artery stenosis. 02/2015 MRI/MRA of head: No acute intracranial process.  Multifocal small areas of encephalomalacia corresponding to prior left cerebral remote infarcts.  Remote bilateral basal ganglia and thalamus lacunar infarcts.  Moderate chronic small vessel ischemic changes.  Focal high-grade stenosis of left M1 segment, stable.  Chronically occluded right anterior cerebral artery with thready irregular left anterior cerebral artery, unchanged.  Thready, somewhat narrowed basilar artery, worse than prior exam though this may be accentuated by motion artifact.  Moderate stenosis left PT segment, worse than prior imaging.  Improved appearance of left middle cerebral artery from prior imaging. 01/05/2016 MRI Brain: No acute or subacute infarction.  Old infarctions affecting the thalami, basal ganglia, genu of the corpus callosum, hemispheric white matter and left and right cortical and subcortical infarctions at the vertex. 07/29/2016 MRI Brain wo: Patchy multifocal acute ischemic predominantly cortical left MCA territory infarcts. 07/28/2016 CT head/CT perfusion/CTA head and Neck: Possible nonocclusive thrombus in left MCA stent.  Abnormally prolonged transit throughout the left MCA superior division without evidence of acute core infarct or large vessel occlusion.  Chronically occluded or severely diseased A1 segments.  Mild right M1 MCA stenosis. 11/2016 Cerebral angiogram: Approximately 70-75% stenosis within the midportion of the stented segment of left MCA most likely secondary to combination of near intimal hyperplasia and  atherosclerotic plaque.  Hypoplastic right anterior cerebral artery A1 segment.  Nonvisualization of left anterior cerebral artery A1 segment.  Dominant posterior communicating arteries bilaterally. 06/07/2017 MRI and MRA of head: No acute intracranial abnormality or abnormal enhancement.  Small chronic infarctions in left frontal centrum semiovale and bilateral caudate heads.  Mild chronic small vessel ischemic changes.  Long segment flow-void of left M1 corresponding to intra-arterial stent.  Bilateral M2 and distal MCA circulation symmetric.  Hypoplastic right A1, nonvisualized left A1, and poor downstream flow related to signal in the bilateral anterior cerebral arteries, similar to prior cerebral angiogram.  Intracranial atherosclerosis with areas of stable mild to moderate stenosis in the anterior and posterior circulation. 12/20/2017 MRI/MRA of head: MRI of the brain unchanged.  Left MCA stent with associated signal loss.  50% stenosis of right M1 distally, unchanged.  Nonvisualization of either anterior cerebral artery A1 segments. 03/06/2018 MRI Brain wo: No acute intracranial abnormality.  Stable compared to prior imaging. 03/2018 Cerebral angiogram: Stable IntraStent stenosis of 75-80% within previously treated left middle cerebral artery severe stenosis with stent assisted angioplasty.  No significant change compared to prior study of February 2018.  PAST MEDICAL HISTORY: Past Medical History:  Diagnosis Date  . Anemia    in the past  . Asthma    " a long time ago"  . Bursitis   . Depression    13 years ago -   . Diabetes mellitus without complication (HCC)    type  2  . GERD (gastroesophageal reflux disease)    occ. acid reflux  . Headache    migraines  . Hypertension   . Kidney stones   . PFO (patent foramen ovale)    hx of PFO with surgery done as an adult  . Rotator cuff tear    right  . Stroke [facility]) 2009  lasdt stroke July 2015  . Syncope and collapse     MEDICATIONS:  Current Outpatient Medications on File Prior to Visit  Medication Sig Dispense Refill  . BRILINTA 90 MG TABS tablet Take 90 mg by mouth 2 (two) times daily.   2  . chlorthalidone (HYGROTON) 25 MG tablet Take 25 mg by mouth at bedtime.   5  . doxycycline (VIBRAMYCIN) 100 MG capsule Take 1 capsule (100 mg total) by mouth 2 (two) times daily. 14 capsule 0  . empagliflozin (JARDIANCE) 25 MG TABS tablet Take 25 mg by mouth daily.    Marland Kitchen HYDROcodone-acetaminophen (NORCO/VICODIN) 5-325 MG tablet Take 1 tablet by mouth every 4 (four) hours as needed. 10 tablet 0  . metFORMIN (GLUCOPHAGE) 1000 MG tablet Take 1,000 mg by mouth 2 (two) times daily with a meal.    . ondansetron (ZOFRAN ODT) 4 MG disintegrating tablet Take 1 tablet (4 mg total) by mouth every 8 (eight) hours as needed. 10 tablet 0  . pravastatin (PRAVACHOL) 80 MG tablet Take 80 mg by mouth at bedtime.     . promethazine (PHENERGAN) 25 MG tablet Take 25 mg by mouth every 4 (four) hours as needed for nausea or vomiting.    . Rimegepant Sulfate (NURTEC) 75 MG TBDP Take by mouth.    Marland Kitchen tiZANidine (ZANAFLEX) 4 MG tablet Take 4 mg by mouth every 6 (six) hours as needed for muscle spasms.    Marland Kitchen topiramate ER (QUDEXY XR) 50 MG CS24 sprinkle capsule Take 1 capsule (50 mg total) by mouth at bedtime. 30 capsule 5  . Ubrogepant (UBRELVY) 100 MG TABS Take 1 tablet by mouth as needed (May repeat in 2 hours if needed.  Maximum 2 tablets in 24 hours). 8 tablet 11   No current facility-administered medications on file prior to visit.    ALLERGIES: Allergies  Allergen Reactions  . Imitrex [Sumatriptan] Anaphylaxis  . Triptans Anaphylaxis and Hives  . Plavix [Clopidogrel Bisulfate] Hives  . Cymbalta [Duloxetine Hcl] Nausea Only  . Lipitor [Atorvastatin] Rash  . Topamax [Topiramate] Other (See Comments)    Memory issues    FAMILY HISTORY: Family History  Problem Relation Age of Onset  . Hypertension Mother   . Hypertension Father   . Cancer Maternal  Aunt   . Breast cancer Maternal Aunt   . Cancer Maternal Uncle    SOCIAL HISTORY: Social History   Socioeconomic History  . Marital status: Single    Spouse name: Not on file  . Number of children: 4  . Years of education: College   . Highest education level: Not on file  Occupational History  . Occupation: [name]    Employer: [employer]  Tobacco Use  . Smoking status: Never Smoker  . Smokeless tobacco: Never Used  Substance and Sexual Activity  . Alcohol use: Yes    Alcohol/week: 0.0 standard drinks    Comment: occasional  . Drug use: No  . Sexual activity: Not Currently  Other Topics Concern  . Not on file  Social History Narrative   Patient is single with 4 children.   Patient is right handed.   Patient has college education.   Patient drinks 1 cup daily.   One story home   Social Determinants of Health   Financial Resource Strain:   . Difficulty of Paying Living Expenses:   Food Insecurity:   . Worried About Programme researcher, broadcasting/film/video in the Last Year:   . Barista in  the Last Year:   Transportation Needs:   . Freight forwarder (Medical):   Marland Kitchen Lack of Transportation (Non-Medical):   Physical Activity:   . Days of Exercise per Week:   . Minutes of Exercise per Session:   Stress:   . Feeling of Stress :   Social Connections:   . Frequency of Communication with Friends and Family:   . Frequency of Social Gatherings with Friends and Family:   . Attends Religious Services:   . Active Member of Clubs or Organizations:   . Attends Banker Meetings:   Marland Kitchen Marital Status:   Intimate Partner Violence:   . Fear of Current or Ex-Partner:   . Emotionally Abused:   Marland Kitchen Physically Abused:   . Sexually Abused:     PHYSICAL EXAM: Blood pressure (!) 150/98, pulse 77, height 5\' 5"  (1.651 m), weight 228 lb 3.2 oz (103.5 kg), SpO2 100 %. General: No acute distress.  Patient appears well-groomed.   Head:  Normocephalic/atraumatic Eyes:  Fundi examined  but not visualized Neck: supple, no paraspinal tenderness, full range of motion Heart:  Regular rate and rhythm Lungs:  Clear to auscultation bilaterally Back: No paraspinal tenderness Neurological Exam: alert and oriented to person, place, and time. Attention span and concentration intact, recent and remote memory intact, fund of knowledge intact.  Speech fluent and not dysarthric, language intact.  CN II-XII intact. Bulk and tone normal, muscle strength 5/5 throughout.  Sensation to light touch, temperature and vibration intact.  Deep tendon reflexes 2+ throughout.  Finger to nose and heel to shin testing intact.  Gait normal, Romberg with sway.  IMPRESSION: 1.  Migraine without aura, without status migrainosus, not intractable 2.  Migraine with aura, without status migrainosus, not intractable 3.  History of CVA 4.  HTN.  Elevated today likely due to pain.  She has tried and failed multiple preventatives including beta-blockers, antiepileptic medications, antidepressants, Botox and 2 CGRP inhibitor autoinjectors.  I would still like her to try combination therapy of Emgality and topiramate ER.  The only options I can recommend is Vyepti.  There really aren't any other options.  Abortive therapy options are limited as well.  Triptans are contraindicated given her history of stroke.  She has failed multiple NSAIDs, over the counter analgesics (such as Excedrin and acetaminophen), Fioricet, and CGRP inhibitors (Ubrelvy and Nurtec).  The only reasonable option at this time would be Reyvow.    PLAN: 1.  For preventative management, She will resume Emgality and start Qudexy 50mg  daily.  Will contact the mail order pharmacy to see what happened.  We can increase Qudexy to 100mg  daily in 6 weeks if needed.  If headaches not improved, then would recommend Vyepti. 2.  For abortive therapy, Reyvow 3. To break current headache, will give tramadol 60mg  injection 4. Follow up with PCP regarding blood  pressure. 5.  Limit use of pain relievers to no more than 2 days out of week to prevent risk of rebound or medication-overuse headache. 6.  Keep headache diary 7.  Exercise, hydration, caffeine cessation, sleep hygiene, monitor for and avoid triggers 8.  Follow up 4 months.   , DO  CC: , MD

## 2020-02-26 ENCOUNTER — Encounter: Payer: Self-pay | Admitting: Neurology

## 2020-02-26 ENCOUNTER — Ambulatory Visit: Payer: Medicaid Other | Admitting: Neurology

## 2020-02-26 ENCOUNTER — Other Ambulatory Visit: Payer: Self-pay

## 2020-02-26 ENCOUNTER — Other Ambulatory Visit: Payer: Self-pay | Admitting: Neurology

## 2020-02-26 VITALS — BP 150/98 | HR 77 | Ht 65.0 in | Wt 228.2 lb

## 2020-02-26 DIAGNOSIS — I10 Essential (primary) hypertension: Secondary | ICD-10-CM | POA: Diagnosis not present

## 2020-02-26 DIAGNOSIS — Z8673 Personal history of transient ischemic attack (TIA), and cerebral infarction without residual deficits: Secondary | ICD-10-CM | POA: Diagnosis not present

## 2020-02-26 DIAGNOSIS — G43711 Chronic migraine without aura, intractable, with status migrainosus: Secondary | ICD-10-CM

## 2020-02-26 MED ORDER — REYVOW 100 MG PO TABS: 100.0000 mg | ORAL_TABLET | Freq: Every day | ORAL | 11 refills | Status: DC | PRN

## 2020-02-26 MED ORDER — KETOROLAC TROMETHAMINE 60 MG/2ML IM SOLN
60.0000 mg | Freq: Once | INTRAMUSCULAR | Status: AC
  Administered 2020-02-26: 60 mg via INTRAMUSCULAR

## 2020-02-26 NOTE — Telephone Encounter (Signed)
Pt states she need a PA started.    Telephone call to [facility] pt do need a PA. Will start  the process of PA

## 2020-02-26 NOTE — Patient Instructions (Signed)
1.  Continue Emgality every 28 days along with topiramate ER (Qudexy) 50mg  at bedtime.  Will find out what happened with the mail order pharmacy. 2.  Will give you Reyvow and try to get it approved.  Take 100mg  tablet (1 tablet in 24 hours). 3.  Will give you a toradol shot 60mg  4.  Follow up in 4 months.

## 2020-02-26 NOTE — Telephone Encounter (Signed)
Patient stated that Topomax will not be approved by mail order pharmacy. Please call.

## 2020-03-03 NOTE — Progress Notes (Signed)
Confirmation #:[id number] WPrior Approval [id number] Status:APPROVED  For 180 days

## 2020-03-15 ENCOUNTER — Ambulatory Visit: Payer: Medicaid Other | Attending: Internal Medicine

## 2020-03-15 DIAGNOSIS — Z23 Encounter for immunization: Secondary | ICD-10-CM

## 2020-03-15 NOTE — Progress Notes (Signed)
   Covid-19 Vaccination Clinic  Name:  Patricia [name]    MRN: [id number] DOB: 07-22-1966  03/15/2020  [name] was observed post Covid-19 immunization for 30 minutes based on pre-vaccination screening without incident. She was provided with Vaccine Information Sheet and instruction to access the V-Safe system.   [name] was instructed to call 911 with any severe reactions post vaccine: Marland Kitchen Difficulty breathing  . Swelling of face and throat  . A fast heartbeat  . A bad rash all over body  . Dizziness and weakness   Immunizations Administered    Name Date Dose VIS Date Route   Pfizer COVID-19 Vaccine 03/15/2020  2:07 PM 0.3 mL 12/17/2018 Intramuscular   Manufacturer: [employer], [employer]   Lot: [id number]   NDC: [address]-3

## 2020-03-24 NOTE — Progress Notes (Signed)
Received phone call from [facility] [place]) [phone] Prior authorization team. She said as of 03/16/2020 when they run a claim for the Topiramate ER it comes back rejected. She asked the date of the PA I told her it was documented in her chart on 03/03/2020. Not sure what the valid dates of approval are.

## 2020-03-27 ENCOUNTER — Emergency Department (HOSPITAL_COMMUNITY): Payer: Medicaid Other

## 2020-03-27 ENCOUNTER — Emergency Department (HOSPITAL_COMMUNITY)
Admission: EM | Admit: 2020-03-27 | Discharge: 2020-03-28 | Disposition: A | Discharge status: Home / Self Care | Payer: Medicaid Other | Attending: Emergency Medicine | Admitting: Emergency Medicine

## 2020-03-27 ENCOUNTER — Other Ambulatory Visit: Payer: Self-pay

## 2020-03-27 ENCOUNTER — Encounter (HOSPITAL_COMMUNITY): Payer: Self-pay

## 2020-03-27 DIAGNOSIS — M25562 Pain in left knee: Secondary | ICD-10-CM | POA: Diagnosis not present

## 2020-03-27 DIAGNOSIS — E119 Type 2 diabetes mellitus without complications: Secondary | ICD-10-CM | POA: Diagnosis not present

## 2020-03-27 DIAGNOSIS — I10 Essential (primary) hypertension: Secondary | ICD-10-CM | POA: Diagnosis not present

## 2020-03-27 DIAGNOSIS — S99911A Unspecified injury of right ankle, initial encounter: Secondary | ICD-10-CM | POA: Diagnosis present

## 2020-03-27 DIAGNOSIS — Y999 Unspecified external cause status: Secondary | ICD-10-CM | POA: Insufficient documentation

## 2020-03-27 DIAGNOSIS — Y9389 Activity, other specified: Secondary | ICD-10-CM | POA: Insufficient documentation

## 2020-03-27 DIAGNOSIS — S8262XA Displaced fracture of lateral malleolus of left fibula, initial encounter for closed fracture: Secondary | ICD-10-CM | POA: Diagnosis not present

## 2020-03-27 DIAGNOSIS — Z8673 Personal history of transient ischemic attack (TIA), and cerebral infarction without residual deficits: Secondary | ICD-10-CM | POA: Insufficient documentation

## 2020-03-27 DIAGNOSIS — Z7984 Long term (current) use of oral hypoglycemic drugs: Secondary | ICD-10-CM | POA: Diagnosis not present

## 2020-03-27 DIAGNOSIS — Z79899 Other long term (current) drug therapy: Secondary | ICD-10-CM | POA: Insufficient documentation

## 2020-03-27 DIAGNOSIS — Y929 Unspecified place or not applicable: Secondary | ICD-10-CM | POA: Diagnosis not present

## 2020-03-27 DIAGNOSIS — W1830XA Fall on same level, unspecified, initial encounter: Secondary | ICD-10-CM | POA: Insufficient documentation

## 2020-03-27 DIAGNOSIS — S82891A Other fracture of right lower leg, initial encounter for closed fracture: Secondary | ICD-10-CM

## 2020-03-27 NOTE — ED Triage Notes (Signed)
Pt arrives POV for eval of R ankle pain and L knee pain after stumbling while playing with her grandkids last night. +swelling to R ankle

## 2020-03-28 ENCOUNTER — Other Ambulatory Visit: Payer: Self-pay

## 2020-03-28 MED ORDER — ACETAMINOPHEN 500 MG PO TABS: 500.0000 mg | ORAL_TABLET | Freq: Once | ORAL | Status: DC

## 2020-03-28 NOTE — ED Notes (Signed)
Walker given to pt.

## 2020-03-28 NOTE — Progress Notes (Signed)
Orthopedic Tech Progress Note Patient Details:  OZELLE BRUBACHER [birth year] [id number]  Ortho Devices Type of Ortho Device: Crutches, CAM walker, Ace wrap Ortho Device/Splint Location: rle ace wrap and cam walker Ortho Device/Splint Interventions: Ordered, Application, Adjustment   Post Interventions Patient Tolerated: Well Instructions Provided: Care of device, Adjustment of device   Trinna Post 03/28/2020, 6:45 AM

## 2020-03-28 NOTE — ED Notes (Signed)
Discharge instructions discussed with pt. Pt verbalized understanding with no questions at this time. Pt to follow up with ortho. Taking walker and crutches home. Pt to go home with son.

## 2020-03-28 NOTE — ED Provider Notes (Signed)
[facility]   CSN: [id number] Arrival date & time: 03/27/20  2049     History Chief Complaint  Patient presents with   Ankle Pain    [name] Leatha GildingLivingston is a 54 y.o. female.  HPI   Patient presents to the emergency department with chief complaint of right ankle pain and left knee pain.  Patient states this happened after she was playing catch with her kids which resulted in her falling.  She denies losing consciousness, hitting her head, and remembers everything that happened.  Patient explains she is unable to bear weight on her right ankle but is able to move her knee with slight pain.  Patient is currently on blood thinners due to history of 3 strokes.  She denies having any chest pain, shortness of breath, abdominal pain, nausea, vomiting.  Past Medical History:  Diagnosis Date   Anemia    in the past   Asthma    " a long time ago"   Bursitis    Depression    13 years ago -    Diabetes mellitus without complication (HCC)    type  2   GERD (gastroesophageal reflux disease)    occ. acid reflux   Headache    migraines   Hypertension    Kidney stones    PFO (patent foramen ovale)    hx of PFO with surgery done as an adult   Rotator cuff tear    right   Stroke [facility](HCC) 2009   lasdt stroke July 2015   Syncope and collapse     Patient Active Problem List   Diagnosis Date Noted   Chronic daily headache 06/19/2017   Intractable chronic migraine without aura and with status migrainosus 09/15/2016   Depression 08/23/2016   Cerebrovascular accident (CVA) due to occlusion of left middle cerebral artery (HCC) 07/28/2016   Acute respiratory failure (HCC)    Middle cerebral artery stenosis, left 07/24/2016   Headache 03/23/2016   Type 2 diabetes mellitus with complication, with long-term current use of insulin (HCC) 09/08/2015   Hyperglycemia    TIA (transient ischemic attack)    AKI (acute kidney  injury) (HCC) 02/20/2015   ARF (acute renal failure) (HCC) 02/20/2015   Rotator cuff tear 02/04/2015   Occipital neuralgia 08/27/2014   Type 2 diabetes mellitus with complication (HCC) 08/27/2014   PFO (patent foramen ovale) 06/17/2014   Hyperlipidemia 06/17/2014   Morbid obesity, unspecified obesity type (HCC) 06/17/2014   Acute CVA (cerebrovascular accident) (HCC) 05/22/2014   Non-compliance 05/22/2014   History of cardioembolic stroke 05/22/2014   Status post device closure of ASD 05/22/2014   HTN (hypertension), benign 05/22/2014   DM type 2 (diabetes mellitus, type 2) (HCC) 05/22/2014    Past Surgical History:  Procedure Laterality Date   ABDOMINAL HYSTERECTOMY     BREAST REDUCTION SURGERY     CARDIAC CATHETERIZATION     CARDIAC SURGERY  Closed hole in heart in 2009   Amplatzer Septal Occluder Ref [id number] (not listed in op notes, document scanned under MRI with written op notes   HYSTEROTOMY     IR ANGIO INTRA EXTRACRAN SEL COM CAROTID INNOMINATE BILAT MOD SED  04/09/2018   IR ANGIO VERTEBRAL SEL SUBCLAVIAN INNOMINATE UNI R MOD SED  04/09/2018   IR GENERIC HISTORICAL  06/23/2016   IR ANGIO INTRA EXTRACRAN SEL COM CAROTID INNOMINATE BILAT MOD SED 06/23/2016 Julieanne CottonSanjeev Deveshwar, MD [facility]   IR GENERIC HISTORICAL  06/23/2016  IR ANGIO VERTEBRAL SEL VERTEBRAL BILAT MOD SED 06/23/2016 Julieanne Cotton, MD [facility]   IR GENERIC HISTORICAL  06/29/2016   IR RADIOLOGIST EVAL & MGMT 06/29/2016 [facility]   IR GENERIC HISTORICAL  07/24/2016   IR INTRA CRAN STENT 07/24/2016 Julieanne Cotton, MD [facility]   IR GENERIC HISTORICAL  07/28/2016   IR PTA INTRACRANIAL 07/28/2016 Julieanne Cotton, MD [facility]   IR GENERIC HISTORICAL  07/28/2016   IR ENDOVASC INTRACRANIAL INF OTHER THAN THROMBO ART INC DIAG ANGIO 07/28/2016 Julieanne Cotton, MD [facility]   IR GENERIC HISTORICAL  08/07/2016   IR RADIOLOGIST EVAL & MGMT 08/07/2016 [facility]   IR  GENERIC HISTORICAL  12/05/2016   IR ANGIO INTRA EXTRACRAN SEL COM CAROTID INNOMINATE BILAT MOD SED 12/05/2016 Julieanne Cotton, MD [facility]   IR GENERIC HISTORICAL  12/05/2016   IR ANGIO VERTEBRAL SEL SUBCLAVIAN INNOMINATE UNI R MOD SED 12/05/2016 Julieanne Cotton, MD [facility]   IR GENERIC HISTORICAL  12/05/2016   IR ANGIO VERTEBRAL SEL VERTEBRAL UNI L MOD SED 12/05/2016 Julieanne Cotton, MD [facility]   RADIOLOGY WITH ANESTHESIA [name] 07/24/2016   Procedure: Stenting;  Surgeon: Julieanne Cotton, MD;  Location: [facility];  Service: Radiology;  Laterality: [name];   RADIOLOGY WITH ANESTHESIA [name] 07/28/2016   Procedure: RADIOLOGY WITH ANESTHESIA;  Surgeon: Medication Radiologist, MD;  Location: [facility] OR;  Service: Radiology;  Laterality: [name];   REDUCTION MAMMAPLASTY Bilateral    SHOULDER ARTHROSCOPY WITH OPEN ROTATOR CUFF REPAIR AND DISTAL CLAVICLE ACROMINECTOMY Right 02/04/2015   Procedure: SHOULDER ARTHROSCOPY WITH SUBACROMIAL DECOMPRESSION AND OPEN ROTATOR CUFF REPAIR,;  Surgeon: Cammy Copa, MD;  Location: [facility] OR;  Service: Orthopedics;  Laterality: Right;  RIGHT SHOULDER DOA,SAD,MINI-OPEN ROTATOR CUFF TEAR REPAIR.     OB History   No obstetric history on file.     Family History  Problem Relation Age of Onset   Hypertension Mother    Hypertension Father    Cancer Maternal Aunt    Breast cancer Maternal Aunt    Cancer Maternal Uncle     Social History   Tobacco Use   Smoking status: Never Smoker   Smokeless tobacco: Never Used  Substance Use Topics   Alcohol use: Yes    Alcohol/week: 0.0 standard drinks    Comment: occasional   Drug use: No    Home Medications Prior to Admission medications   Medication Sig Start Date End Date Taking? Authorizing Provider  BRILINTA 90 MG TABS tablet Take 90 mg by mouth 2 (two) times daily.  01/15/18   [provider]  chlorthalidone (HYGROTON) 25 MG tablet Take 25 mg by mouth at bedtime.  09/03/15   [provider]  EMGALITY 120 MG/ML SOAJ Inject 120 mg as directed every 30 (thirty) days. 01/13/20   [provider]  empagliflozin (JARDIANCE) 25 MG TABS tablet Take 25 mg by mouth daily.    [provider]  HYDROcodone-acetaminophen (NORCO/VICODIN) 5-325 MG tablet Take 1 tablet by mouth every 4 (four) hours as needed. 11/28/19   Jacalyn Lefevre, MD  Lasmiditan Succinate (REYVOW) 100 MG TABS Take 100 mg by mouth daily as needed (Maximum 1 tablet in 24 hours.). 02/26/20   Drema Dallas, DO  metFORMIN (GLUCOPHAGE) 1000 MG tablet Take 1,000 mg by mouth 2 (two) times daily with a meal.    [provider]  ondansetron (ZOFRAN ODT) 4 MG disintegrating tablet Take 1 tablet (4 mg total) by mouth every 8 (eight) hours as needed. Patient  not taking: Reported on 02/26/2020 11/28/19   Jacalyn Lefevre, MD  pravastatin (PRAVACHOL) 80 MG tablet Take 80 mg by mouth at bedtime.     [provider]  promethazine (PHENERGAN) 25 MG tablet Take 25 mg by mouth every 4 (four) hours as needed for nausea or vomiting.    [provider]  Rimegepant Sulfate (NURTEC) 75 MG TBDP Take by mouth.    [provider]  tiZANidine (ZANAFLEX) 4 MG tablet Take 4 mg by mouth every 6 (six) hours as needed for muscle spasms.    [provider]  topiramate ER (QUDEXY XR) 50 MG CS24 sprinkle capsule Take 1 capsule (50 mg total) by mouth at bedtime. Patient not taking: Reported on 02/26/2020 11/13/19   Drema Dallas, DO  Ubrogepant (UBRELVY) 100 MG TABS Take 1 tablet by mouth as needed (May repeat in 2 hours if needed.  Maximum 2 tablets in 24 hours). Patient not taking: Reported on 02/26/2020 12/19/19   Drema Dallas, DO    Allergies    Imitrex [sumatriptan], Triptans, Plavix [clopidogrel bisulfate], Cymbalta [duloxetine hcl], Lipitor [atorvastatin], and Topamax [topiramate]  Review of Systems   Review of Systems  Constitutional: Negative for chills and fever.  HENT: Negative for  congestion and sore throat.   Eyes: Negative for visual disturbance.  Respiratory: Negative for cough and shortness of breath.   Cardiovascular: Negative for chest pain and palpitations.  Gastrointestinal: Negative for abdominal pain.  Genitourinary: Negative for enuresis.  Musculoskeletal: Negative for back pain.       Patient admits to left knee pain but is able to place weight on it.  She also admits to right ankle pain and is unable to apply weight to the area.  Skin: Negative for rash.  Neurological: Negative for dizziness, light-headedness and headaches.  Hematological: Does not bruise/bleed easily.    Physical Exam Updated Vital Signs BP (!) 148/98    Pulse 84    Temp 98.7 F (37.1 C) (Oral)    Resp 20    Ht 5\' 5"  (1.651 m)    Wt 100.2 kg    SpO2 100%    BMI 36.78 kg/m   Physical Exam Vitals and nursing note reviewed.  Constitutional:      General: She is not in acute distress.    Appearance: She is not ill-appearing.  HENT:     Head: Normocephalic and atraumatic.     Nose: No congestion.     Mouth/Throat:     Mouth: Mucous membranes are moist.     Pharynx: Oropharynx is clear.  Eyes:     General: No scleral icterus. Cardiovascular:     Rate and Rhythm: Normal rate and regular rhythm.     Pulses: Normal pulses.     Heart sounds: No murmur. No friction rub. No gallop.   Pulmonary:     Effort: No respiratory distress.     Breath sounds: No wheezing, rhonchi or rales.  Abdominal:     General: There is no distension.     Tenderness: There is no abdominal tenderness. There is no guarding.  Musculoskeletal:        General: No swelling.     Comments: Patient's right ankle has 1+ edema.  There is no bruising, lacerations, other abnormalities noted.  Ankle was tender to the touch on the malleolus.  Achilles tendon was felt to be fully intact no defect noted.  Patient foot was soft to the touch, had good pedal pulse, good cap  refill, good sensation.  She was able to move her  toes without difficulty but had pain when she dorsi or plantar flex to her ankle.  Patient's left knee had a small abrasion on her patella.  She had full range of motion, good strength at the hip, knee, ankle, good pedal pulse, good capillary refill, sensation intact.  Skin:    General: Skin is warm and dry.     Capillary Refill: Capillary refill takes less than 2 seconds.     Findings: No rash.  Neurological:     Mental Status: She is alert and oriented to person, place, and time.  Psychiatric:        Mood and Affect: Mood normal.     ED Results / Procedures / Treatments   Labs (all labs ordered are listed, but only abnormal results are displayed) Labs Reviewed - No data to display  EKG None  Radiology DG Ankle Complete Right  Result Date: 03/27/2020 CLINICAL DATA:  Larey Seat, right ankle pain EXAM: RIGHT ANKLE - COMPLETE 3+ VIEW COMPARISON:  None. FINDINGS: Frontal, oblique, lateral views of the right ankle are obtained. There is an avulsion fracture of the tip of the lateral malleolus, which is minimally displaced. No other acute displaced fractures. Ankle mortise is intact. There is significant anterior and lateral soft tissue swelling. IMPRESSION: 1. Avulsion fracture tip of the lateral malleolus. 2. Anterolateral soft tissue swelling. Electronically Signed   By: Sharlet Salina M.D.   On: 03/27/2020 22:03   DG Knee Complete 4 Views Left  Result Date: 03/27/2020 CLINICAL DATA:  Larey Seat, left knee pain EXAM: LEFT KNEE - COMPLETE 4+ VIEW COMPARISON:  None. FINDINGS: Frontal, bilateral oblique, and lateral views of the left knee are obtained. No acute displaced fracture, subluxation, or dislocation. There is mild medial compartmental osteoarthritis. No joint effusion. IMPRESSION: 1. Mild medial compartmental osteoarthritis. 2. No acute bony abnormality. Electronically Signed   By: Sharlet Salina M.D.   On: 03/27/2020 22:04    Procedures Procedures (including critical care time)  Medications  Ordered in ED Medications  acetaminophen (TYLENOL) tablet 500 mg (has no administration in time range)    ED Course  I have reviewed the triage vital signs and the nursing notes.  Pertinent labs & imaging results that were available during my care of the patient were reviewed by me and considered in my medical decision making (see chart for details).    MDM Rules/Calculators/A&P                      I have personally reviewed all imaging, labs and have interpreted them.  Due to patient's presentation and history I am concerned for fracture versus dislocation versus tendon/ligament damage versus compartment syndrome.  I have low suspicion for compartment syndrome as patient's foot was soft to the touch, had good pedal pulses, good cap refill.  Also have low suspicion for ligament or tendon damage as she was able to articulate her ankle as well as her toes.  Patient's x-ray of her right foot showed avulsion fracture of the tip of the lateral malleolus with anterior lateral soft at the swelling.  Left knee showed mild medial compartment osteoarthritis with no acute bony abnormalities.  Patient will have her right ankle wrapped and placed in a boot and given a walker.  Patient does not appear to be in any distress, is resting comfortably in a chair.  Patient does not meet emergent criteria to be admitted to the hospital.  It is my suspicion that the patient's pain is a result of an avulsion fracture on her lateral malleolus.  Was given a boot, walker and a referral to orthopedics for further management and evaluation.  She was given at home instructions as well as strict return precautions.  Patient was explained the results and plan, she verbalized that she understood and agrees with that plan.   Final Clinical Impression(s) / ED Diagnoses Final diagnoses:  Closed avulsion fracture of right ankle, initial encounter  Acute pain of left knee    Rx / DC Orders ED Discharge Orders    None        Marcello Fennel, PA-C 54/65/68 [id number]    Delora Fuel, MD 17/00/17 [phone]

## 2020-03-28 NOTE — Discharge Instructions (Signed)
You have been seen for a avulsion fracture of the foot.  You have been given ace bandages to wrap up your right ankle.  As well as a boot to immobilize your foot.  I have also provided you with crutches to help keep weight off of your foot.  I want you to take Tylenol every 6 hours for pain.  I recommend that you keep your right foot elevated, apply ice to the area, and rested as this will help with pain and inflammation.    I have given you information for an orthopedic doctor I want you to follow-up with them in 1 week for further evaluation.  If you have your own orthopedic doctor you may follow-up with them.  I want you to come back to the emergency department if your foot becomes numb, tingling, changes color, have severe pain or the swelling increases in your foot  and it feels as hard as a rock, shortness of breath, chest pain, uncontrolled nausea, vomiting, diarrhea as he symptoms require further evaluation.

## 2020-03-28 NOTE — ED Notes (Signed)
Ortho requested for cam boot

## 2020-04-19 ENCOUNTER — Encounter: Payer: Self-pay | Admitting: Neurology

## 2020-04-19 NOTE — Progress Notes (Addendum)
Confirmation #:[id number] PBenefit Plan:MCAIDHealth Plan:[employer] Prior Approval [id number] PA Type:PHARMACYRecipient:Patricia [name] HC:[phone] PBilling Provider:Billing Provider DV:[id number] Provider Luretha Rued JAFFERequesting Provider GY:[phone] Submission Date:06/30/2021Status:APPROVEDEffective Begin Date:06/30/2021Effective End Date:06/25/2022Payer:[place] [employer]ENEFITS# of Attachments:0PA Documents:View Documents Attachments Attachment Type Attachment Control # Transmission Code Back to top Line Item 1 Status:APPROVEDDrug Name/Code:47084Drug Code Type:GCNRequestedLength of Therapy:365 DAYSTotal Quantity:120.000Strength:ApprovedLength of Therapy:365 DAYSTotal Quantity:120.000Back to top

## 2020-05-14 ENCOUNTER — Telehealth: Payer: Self-pay | Admitting: Neurology

## 2020-05-14 NOTE — Telephone Encounter (Signed)
Per Dr. Everlena Cooper pt can have two boxes.  Telephone call to pt, Advised samples will be at the front desk

## 2020-05-14 NOTE — Telephone Encounter (Signed)
Patient called in needing samples of Reydow

## 2020-06-14 ENCOUNTER — Encounter: Payer: Self-pay | Admitting: Neurology

## 2020-06-14 NOTE — Progress Notes (Addendum)
Patricia [name] (Key: [id number]) Reyvow 100MG  tablets   Form [employer] Form [phone] NCPDP) Created 5 minutes ago Sent to Plan 3 minutes ago Plan Response 3 minutes ago Submit Clinical Questions 2 minutes ago Determination Favorable 2 minutes ago Message from Plan PA Case: ([id number], Status: Approved, Coverage Starts on: 06/14/2020 12:00:00 AM, Coverage Ends on: 06/14/2021 12:00:00 AM.

## 2020-07-05 NOTE — Progress Notes (Signed)
NEUROLOGY FOLLOW UP OFFICE NOTE  Patricia [name] [id number]  HISTORY OF PRESENT ILLNESS: Patricia [name] is a [age] year oldright-handed black female with HTN, diabetes, HLD and history of stroke who follows up for migraine.  UPDATE: She reports 15 headache days in last 30 days.  Reyvow helps (dulls the headache) but makes her drowsy.   Current NSAIDS:none Current  analgesics:none Current triptans:Contraindicated (CVA) Current ergotamine:Contraindicated (CVA) Current anti-emetic:Promethazine 25mg  Current muscle relaxants:Tizanidine 4mg  at bedtime Current anti-anxiolytic:none Current sleep aide:none Current Antihypertensive medications:none Current Antidepressant medications:none Current Anticonvulsant medications:Qudexy 50mg  daily Current anti-CGRP:Emgality Current Vitamins/Herbal/Supplements:Magnesium oxide 400mg  daily Current Antihistamines/Decongestants:Diphenhydramine PRN Other therapy:Reyvow Hormone/birth control:none  Caffeine:1 cup of coffee once a week at most Alcohol:rarely Smoker:no Diet:Hydrates Exercise:no Depression:mild; Anxiety:Mild. Currently without a job and needed to move in with her daughter. 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 Other pertinent stroke workup included negative vasculitis and hypercoagulable panel. She has had recurrent episodes of speech difficulty. Repeat MRIs and EEG were negative.   She started having migraines at 54 years old. They may be a severe pounding pain that occurs behind right eye or right occipital region.They are associated with nausea, vomiting, osmophobia, dizziness and sometimes visual aura (objects in vision seem tilted)  as well as right facial weakness and speech disturbance. They typically last 2 days to 1 week. 20 headache days a month on average. Triggers include certain smells (scented lotions) or change in weather. No relieving factors. [facility] but [facility] has been ineffective.  Takes a pain reliever once a week on-average (ineffective)  Past NSAIDS:Ibuprofen, naproxen Past analgesics:Percocet, Tylenol, Excedrin, tramadol, acetaminophen-diphennhdramine Past abortive triptans:Sumatriptan (side effects) Past abortive ergotamine:none Past muscle relaxants:none Past anti-emetic:none Past antihypertensive medications:Propranolol (ineffective) Past antidepressant medications:Amitriptyline (ineffective),venlafaxine (ineffective),sertraline 50mg  Past anticonvulsant medications:topiramate (cognitive deficits), Depakote (ineffective), gabapentin (ineffective), Keppra (ineffective), Lamictal (ineffective), Lyrica (ineffective), zonisamide 100mg  daily Past anti-CGRP:Aimovig (ineffective), Bernita RaisinUbrelvy, Nurtec Past vitamins/Herbal/Supplements:none Past antihistamines/decongestants:none Other past therapies:Botox   Family history of headache:Son, grandson  Imaging: 05/22/2014 MRI/MRA of head:Multiple acute infarcts in left hemisphere.Old infarctions affecting the thalami, hemispheric white matter, genu of corpus callosum and right parietal cortex at the vertex. Advanced intracranial disease, particularly severe at the M1 segment of left MCA showing 90% stenosis. 07/03/2014 CTA Head & Neck:Severe proximal left MCA stenosis. Mild proximal right MCA stenosis. Bilateral MCA branch vessel irregularity and attenuation. Occlusion of the anterior cerebral arteries at their origins with some reconstituted but severely diminished flow distally. Mild left PT stenosis. No evidence of cervical carotid or vertebral artery  stenosis. 02/2015 MRI/MRA of head:No acute intracranial process. Multifocal small areas of encephalomalacia corresponding to prior left cerebral remote infarcts. Remote bilateral basal ganglia and thalamus lacunar infarcts. Moderate chronic small vessel ischemic changes. Focal high-grade stenosis of left M1 segment, stable. Chronically occluded right anterior cerebral artery with thready irregular left anterior cerebral artery, unchanged. Thready, somewhat narrowed basilar artery, worse than prior exam though this may be accentuated by motion artifact. Moderate stenosis left PT segment, worse than prior imaging. Improved appearance of left middle cerebral artery from prior imaging. 01/05/2016 MRI Brain:No acute or subacute infarction. Old infarctions affecting the thalami, basal ganglia, genu of the corpus callosum, hemispheric white matter and left and right cortical and  subcortical infarctions at the vertex. 07/29/2016 MRI Brain AO:ZHYQMV multifocal acute ischemic predominantly cortical left MCA territory infarcts. 07/28/2016 CT head/CT perfusion/CTA head and Neck:Possible nonocclusive thrombus in left MCA stent. Abnormally prolonged transit throughout the left MCA superior division without evidence of acute core infarct or large vessel occlusion. Chronically occluded or severely diseased A1 segments. Mild right M1 MCA stenosis. 11/2016 Cerebral angiogram:Approximately 70-75% stenosis within the midportion of the stented segment of left MCA most likely secondary to combination of near intimal hyperplasia and atherosclerotic plaque. Hypoplastic right anterior cerebral artery A1 segment. Nonvisualization of left anterior cerebral artery A1 segment. Dominant posterior communicating arteries bilaterally. 06/07/2017 MRI and MRA of head:No acute intracranial abnormality or abnormal enhancement. Small chronic infarctions in left frontal centrum semiovale and bilateral caudate heads. Mild chronic  small vessel ischemic changes. Long segment flow-void of left M1 corresponding to intra-arterial stent. Bilateral M2 and distal MCA circulation symmetric. Hypoplastic right A1, nonvisualized left A1, and poor downstream flow related to signal in the bilateral anterior cerebral arteries, similar to prior cerebral angiogram. Intracranial atherosclerosis with areas of stable mild to moderate stenosis in the anterior and posterior circulation. 12/20/2017 MRI/MRA of head:MRI of the brain unchanged. Left MCA stent with associated signal loss. 50% stenosis of right M1 distally, unchanged. Nonvisualization of either anterior cerebral artery A1 segments. 03/06/2018 MRI Brain wo:No acute intracranial abnormality. Stable compared to prior imaging. 03/2018 Cerebral angiogram:Stable IntraStent stenosis of 75-80% within previously treated left middle cerebral artery severe stenosis with stent assisted angioplasty. No significant change compared to prior study of February 2018.  PAST MEDICAL HISTORY: Past Medical History:  Diagnosis Date  . Anemia    in the past  . Asthma    " a long time ago"  . Bursitis   . Depression    13 years ago -   . Diabetes mellitus without complication (HCC)    type  2  . GERD (gastroesophageal reflux disease)    occ. acid reflux  . Headache    migraines  . Hypertension   . Kidney stones   . PFO (patent foramen ovale)    hx of PFO with surgery done as an adult  . Rotator cuff tear    right  . Stroke [facility]) 2009   lasdt stroke July 2015  . Syncope and collapse     MEDICATIONS: Current Outpatient Medications on File Prior to Visit  Medication Sig Dispense Refill  . BRILINTA 90 MG TABS tablet Take 90 mg by mouth 2 (two) times daily.   2  . chlorthalidone (HYGROTON) 25 MG tablet Take 25 mg by mouth at bedtime.   5  . EMGALITY 120 MG/ML SOAJ Inject 120 mg as directed every 30 (thirty) days.    . empagliflozin (JARDIANCE) 25 MG TABS tablet Take 25 mg by mouth  daily.    Marland Kitchen HYDROcodone-acetaminophen (NORCO/VICODIN) 5-325 MG tablet Take 1 tablet by mouth every 4 (four) hours as needed. 10 tablet 0  . Lasmiditan Succinate (REYVOW) 100 MG TABS Take 100 mg by mouth daily as needed (Maximum 1 tablet in 24 hours.). 10 tablet 11  . metFORMIN (GLUCOPHAGE) 1000 MG tablet Take 1,000 mg by mouth 2 (two) times daily with a meal.    . ondansetron (ZOFRAN ODT) 4 MG disintegrating tablet Take 1 tablet (4 mg total) by mouth every 8 (eight) hours as needed. (Patient not taking: Reported on 02/26/2020) 10 tablet 0  . pravastatin (PRAVACHOL) 80 MG tablet Take 80 mg by mouth at bedtime.     Marland Kitchen  promethazine (PHENERGAN) 25 MG tablet Take 25 mg by mouth every 4 (four) hours as needed for nausea or vomiting.    . Rimegepant Sulfate (NURTEC) 75 MG TBDP Take by mouth.    Marland Kitchen tiZANidine (ZANAFLEX) 4 MG tablet Take 4 mg by mouth every 6 (six) hours as needed for muscle spasms.    Marland Kitchen topiramate ER (QUDEXY XR) 50 MG CS24 sprinkle capsule Take 1 capsule (50 mg total) by mouth at bedtime. (Patient not taking: Reported on 02/26/2020) 30 capsule 5  . Ubrogepant (UBRELVY) 100 MG TABS Take 1 tablet by mouth as needed (May repeat in 2 hours if needed.  Maximum 2 tablets in 24 hours). (Patient not taking: Reported on 02/26/2020) 8 tablet 11   No current facility-administered medications on file prior to visit.    ALLERGIES: Allergies  Allergen Reactions  . Imitrex [Sumatriptan] Anaphylaxis  . Triptans Anaphylaxis and Hives  . Plavix [Clopidogrel Bisulfate] Hives  . Cymbalta [Duloxetine Hcl] Nausea Only  . Lipitor [Atorvastatin] Rash  . Topamax [Topiramate] Other (See Comments)    Memory issues    FAMILY HISTORY: Family History  Problem Relation Age of Onset  . Hypertension Mother   . Hypertension Father   . Cancer Maternal Aunt   . Breast cancer Maternal Aunt   . Cancer Maternal Uncle     SOCIAL HISTORY: Social History   Socioeconomic History  . Marital status: Single    Spouse  name: Not on file  . Number of children: 4  . Years of education: College   . Highest education level: Not on file  Occupational History  . Occupation: [name]    Employer: [employer]  Tobacco Use  . Smoking status: Never Smoker  . Smokeless tobacco: Never Used  Vaping Use  . Vaping Use: Never used  Substance and Sexual Activity  . Alcohol use: Yes    Alcohol/week: 0.0 standard drinks    Comment: occasional  . Drug use: No  . Sexual activity: Not Currently  Other Topics Concern  . Not on file  Social History Narrative   Patient is single with 4 children.   Patient is right handed.   Patient has college education.   Patient drinks 1 cup daily.   One story home   Social Determinants of Health   Financial Resource Strain:   . Difficulty of Paying Living Expenses: Not on file  Food Insecurity:   . Worried About Programme researcher, broadcasting/film/video in the Last Year: Not on file  . Ran Out of Food in the Last Year: Not on file  Transportation Needs:   . Lack of Transportation (Medical): Not on file  . Lack of Transportation (Non-Medical): Not on file  Physical Activity:   . Days of Exercise per Week: Not on file  . Minutes of Exercise per Session: Not on file  Stress:   . Feeling of Stress : Not on file  Social Connections:   . Frequency of Communication with Friends and Family: Not on file  . Frequency of Social Gatherings with Friends and Family: Not on file  . Attends Religious Services: Not on file  . Active Member of Clubs or Organizations: Not on file  . Attends Banker Meetings: Not on file  . Marital Status: Not on file  Intimate Partner Violence:   . Fear of Current or Ex-Partner: Not on file  . Emotionally Abused: Not on file  . Physically Abused: Not on file  . Sexually Abused: Not  on file     PHYSICAL EXAM: Blood pressure (!) 141/95, pulse 97, resp. rate 18, height 5\' 5"  (1.651 m), weight 208 lb (94.3 kg), SpO2 98 %.  General: No acute distress.  Patient  appears well-groomed.   Head:  Normocephalic/atraumatic Eyes:  Fundi examined but not visualized Neck: supple, no paraspinal tenderness, full range of motion Heart:  Regular rate and rhythm Lungs:  Clear to auscultation bilaterally Back: No paraspinal tenderness Neurological Exam: alert and oriented to person, place, and time. Attention span and concentration intact, recent and remote memory intact, fund of knowledge intact.  Speech fluent and not dysarthric, language intact.  CN II-XII intact. Bulk and tone normal, muscle strength 5/5 throughout.  Sensation to light touch, temperature and vibration intact.  Deep tendon reflexes 2+ throughout, toes downgoing.  Finger to nose and heel to shin testing intact.  Gait normal, Romberg negative.  IMPRESSION: 1.  Migraine with aura, without status migrainosus, not intractable 2.  Chronic migraine without aura, without status migrainosus, not intractable 3.  History of stroke   PLAN: 1.  For preventative management, continue Emgality monthly but increase Qudexy to 100mg  daily.  If ineffective, would consider Vyepti 2.  For abortive therapy, Reyvow 3.  Limit use of pain relievers to no more than 2 days out of week to prevent risk of rebound or medication-overuse headache. 4.  Keep headache diary 5.  Exercise, hydration, caffeine cessation, sleep hygiene, monitor for and avoid triggers 6.  Follow up 4 to 6 months.   , DO  CC: , MD

## 2020-07-06 ENCOUNTER — Ambulatory Visit: Payer: Medicaid Other | Admitting: Neurology

## 2020-07-06 ENCOUNTER — Other Ambulatory Visit: Payer: Self-pay

## 2020-07-06 ENCOUNTER — Encounter: Payer: Self-pay | Admitting: Neurology

## 2020-07-06 VITALS — BP 141/95 | HR 97 | Resp 18 | Ht 65.0 in | Wt 208.0 lb

## 2020-07-06 DIAGNOSIS — I63512 Cerebral infarction due to unspecified occlusion or stenosis of left middle cerebral artery: Secondary | ICD-10-CM | POA: Diagnosis not present

## 2020-07-06 DIAGNOSIS — G43711 Chronic migraine without aura, intractable, with status migrainosus: Secondary | ICD-10-CM

## 2020-07-06 MED ORDER — TOPIRAMATE ER 100 MG PO SPRINKLE CAP24: 100.0000 mg | EXTENDED_RELEASE_CAPSULE | Freq: Every day | ORAL | 5 refills | Status: DC

## 2020-07-06 MED ORDER — EMGALITY 120 MG/ML ~~LOC~~ SOAJ: 120.0000 mg | SUBCUTANEOUS | 5 refills | Status: DC

## 2020-07-06 NOTE — Patient Instructions (Signed)
1.  I have increased topiramate ER (Qudexy) to 100mg  daily.  Continue Emgality every 30 days. 2.  Take Reyvow for migraine attack as directed. 3.  Limit use of pain relievers to no more than 2 days out of week to prevent risk of rebound or medication-overuse headache. 4.  Keep headache diary 5.  Follow up in 4 to 6 months.

## 2020-07-12 ENCOUNTER — Encounter: Payer: Self-pay | Admitting: Neurology

## 2020-07-12 NOTE — Progress Notes (Signed)
Lonetta Blassingame (Key: [id number]) Rx #: [id number] Reyvow 100MG  tablets   Form [employer] [facility] Electronic [facility] Form [phone] NCPDP) Created 6 days ago Sent to Plan Determination Favorable 5 days ago

## 2020-07-16 ENCOUNTER — Encounter: Payer: Self-pay | Admitting: Neurology

## 2020-07-16 NOTE — Progress Notes (Signed)
Patricia [name] - PA Case ID: [id number] Need help? Call us at [phone] Outcome Approvedtoday PA Case: [id number], Status: Approved, Coverage Starts on: 07/16/2020 12:00:00 AM, Coverage Ends on: 07/16/2021 12:00:00 AM. Drug Emgality 120MG /ML auto-injectors (migraine) Form [employer] [facility] Electronic [facility] Form [phone] NCPDP)

## 2020-07-26 ENCOUNTER — Other Ambulatory Visit: Payer: Self-pay | Admitting: Family Medicine

## 2020-07-26 DIAGNOSIS — Z1231 Encounter for screening mammogram for malignant neoplasm of breast: Secondary | ICD-10-CM

## 2020-07-30 ENCOUNTER — Other Ambulatory Visit: Payer: Self-pay | Admitting: Family Medicine

## 2020-07-30 DIAGNOSIS — Z1231 Encounter for screening mammogram for malignant neoplasm of breast: Secondary | ICD-10-CM

## 2020-08-06 ENCOUNTER — Other Ambulatory Visit: Payer: Self-pay

## 2020-08-06 ENCOUNTER — Ambulatory Visit
Admission: RE | Admit: 2020-08-06 | Discharge: 2020-08-06 | Disposition: A | Discharge status: Home / Self Care | Payer: Medicaid Other | Source: Ambulatory Visit

## 2020-08-06 DIAGNOSIS — Z1231 Encounter for screening mammogram for malignant neoplasm of breast: Secondary | ICD-10-CM

## 2020-08-27 ENCOUNTER — Other Ambulatory Visit: Payer: Self-pay | Admitting: Neurology

## 2020-08-30 ENCOUNTER — Telehealth: Payer: Self-pay | Admitting: Neurology

## 2020-08-30 NOTE — Telephone Encounter (Signed)
It is not good to take Reyvow more than 10 days a month, as it may cause rebound headache.

## 2020-08-30 NOTE — Telephone Encounter (Signed)
Patient called and said her insurance needs a prior authorization for her Reyvow 100 MG.

## 2020-08-30 NOTE — Telephone Encounter (Signed)
Called pharm because there was a PA on file. She said they actually told her that she was trying to get a refill too soon. She picked up a prescription for it on 10/28 and they can only do the 10 per 30 days so it is too soon to get the medication. Called Patient and she is out of medication and wanting to know if she can get samples to hold her until the end of the month when she can get another refill. Also The pharmacist said there were no refills on file after this one.

## 2020-09-01 NOTE — Telephone Encounter (Signed)
Script faxed to pharmacy.   Pt advised to only take 10 pills a month.

## 2020-09-30 ENCOUNTER — Other Ambulatory Visit: Payer: Self-pay | Admitting: Neurology

## 2020-09-30 ENCOUNTER — Telehealth: Payer: Self-pay | Admitting: Neurology

## 2020-09-30 MED ORDER — QULIPTA 60 MG PO TABS: 60.0000 mg | ORAL_TABLET | Freq: Every day | ORAL | 5 refills | Status: DC

## 2020-09-30 NOTE — Telephone Encounter (Signed)
Pt agrees to start Qulipta.    Dr.Jaffe please send in script to pharmacy.  Chelsea can we start a PA for the Quipta and check on the Reyvow PA. Pt states she is unable to pick it up.

## 2020-09-30 NOTE — Telephone Encounter (Signed)
Patient called and said, "Dr. Everlena Cooper increased my topamax to 100 MG but it hasn't help with my headaches. I've had a headache for two weeks. Also, I've not been able to get my Reyvow yet because it requires a prior authorization."  [facility] [address]  Cc: [employer]

## 2020-09-30 NOTE — Telephone Encounter (Signed)
I would discontinue Emgality.  Instead, I would like to start Qulipta 60mg  tablet once daily.

## 2020-09-30 NOTE — Telephone Encounter (Signed)
Qulipta script sent.

## 2020-10-01 ENCOUNTER — Encounter: Payer: Self-pay | Admitting: Neurology

## 2020-10-01 NOTE — Progress Notes (Signed)
Dutches Leatha Gilding Key: [id number] - PA Case ID: [id number] Need help? Call us at [phone] Outcome Approvedtoday PA Case: [id number], Status: Approved, Coverage Starts on: 10/01/2020 12:00:00 AM, Coverage Ends on: 10/01/2021 12:00:00 AM. Drug Bennie Pierini 60MG  tablets Form [employer] [facility] Electronic [facility] Form [phone] NCPDP)

## 2020-10-13 ENCOUNTER — Telehealth: Payer: Self-pay | Admitting: Neurology

## 2020-10-13 NOTE — Telephone Encounter (Signed)
Headache for over week a week and is having nausea. She is requesting a prescription for phenergan.  [facility] on [address]

## 2020-10-14 ENCOUNTER — Other Ambulatory Visit: Payer: Self-pay | Admitting: Neurology

## 2020-10-14 MED ORDER — PROMETHAZINE HCL 25 MG PO TABS: 25.0000 mg | ORAL_TABLET | ORAL | 5 refills | Status: DC | PRN

## 2020-10-14 NOTE — Telephone Encounter (Signed)
Script for Phenergan sent.  She was just started on the [place] and needs to give it more time (several weeks)

## 2020-10-14 NOTE — Telephone Encounter (Signed)
Left a detailed message. With note below

## 2020-12-06 NOTE — Progress Notes (Signed)
NEUROLOGY FOLLOW UP OFFICE NOTE  MA MUNOZ [id number]   Subjective:  Patricia [name] is a 55 year oldright-handed black female with HTN, diabetes, HLD and history of stroke who follows up for migraines.  UPDATE: Increased Qudexy to 100mg  but headaches continued to be frequent.  Emgality was switched to 60mg . Headaches are still frequent.  Headaches are severe, last up to a week, almost daily. Current NSAIDS:none Current analgesics:none Current triptans:Contraindicated (CVA) Current ergotamine:Contraindicated (CVA) Current anti-emetic:Promethazine 25mg  Current muscle relaxants:Tizanidine 4mg  at bedtime Current anti-anxiolytic:none Current sleep aide:none Current Antihypertensive medications:none Current Antidepressant medications:none Current Anticonvulsant medications:Qudexy 100mg  daily Current anti-CGRP:Qulipta 60mg  daily Current Vitamins/Herbal/Supplements:Magnesium oxide 400mg  daily Current Antihistamines/Decongestants:Diphenhydramine PRN Other therapy:Reyvow Hormone/birth control:none  Caffeine:1 cup of coffee once a week at most Alcohol:rarely Smoker:no Diet:Hydrates Exercise:no Depression:mild; Anxiety:Mild. Currently without a job and needed to move in with her daughter. 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 Other pertinent stroke workup included negative vasculitis and hypercoagulable panel. She has had recurrent episodes of speech difficulty and dragging of her right foot. Repeat MRIs and EEG were negative.   She started having migraines at 55 years old. They may be a severe pounding pain that occurs behind right eye or right occipital region.They  are associated with nausea, vomiting, osmophobia, dizziness and sometimes visual aura (objects in vision seem tilted) as well as right facial weakness and speech disturbance. They typically last 2 days to 1 week. 20 headache days a month on average. Triggers include certain smells (scented lotions) or change in weather. No relieving factors. She has been followed by headache specialist at [facility] as well as at [facility] but treatment has been ineffective.  Takes a pain reliever once a week on-average (ineffective)  Past NSAIDS:Ibuprofen, naproxen Past analgesics:Percocet, Tylenol, Excedrin, tramadol, acetaminophen-diphennhdramine Past abortive triptans:Sumatriptan (side effects) Past abortive ergotamine:none Past muscle relaxants:none Past anti-emetic:none Past antihypertensive medications:Propranolol (ineffective) Past antidepressant medications:Amitriptyline (ineffective),venlafaxine (ineffective),sertraline 50mg  Past anticonvulsant medications:topiramate (cognitive deficits), Depakote (ineffective), gabapentin (ineffective), Keppra (ineffective), Lamictal (ineffective), Lyrica (ineffective), zonisamide 100mg  daily Past anti-CGRP:Aimovig, Emgality, , Nurtec (rescue) Past vitamins/Herbal/Supplements:none Past antihistamines/decongestants:none Other past therapies:Botox   Family history of headache:Son, grandson  Imaging: 05/22/2014 MRI/MRA of head:Multiple acute infarcts in left hemisphere.Old infarctions affecting the thalami, hemispheric white matter, genu of corpus callosum and right parietal cortex at the vertex. Advanced intracranial disease, particularly severe at the M1 segment of left MCA showing 90% stenosis. 07/03/2014 CTA Head & Neck:Severe proximal left MCA stenosis. Mild proximal right MCA stenosis. Bilateral MCA branch vessel irregularity and attenuation. Occlusion of the anterior cerebral arteries at their origins with some  reconstituted but severely diminished flow distally. Mild left PT stenosis. No evidence of cervical carotid or vertebral artery stenosis. 02/2015 MRI/MRA of head:No acute intracranial process. Multifocal small areas of encephalomalacia corresponding to prior left cerebral remote infarcts. Remote bilateral basal ganglia and thalamus lacunar infarcts. Moderate chronic small vessel ischemic changes. Focal high-grade stenosis of left M1 segment, stable. Chronically occluded right anterior cerebral artery with thready irregular left anterior cerebral artery, unchanged. Thready, somewhat narrowed basilar artery, worse than prior exam though this may be accentuated by motion artifact. Moderate stenosis left PT segment, worse than prior imaging. Improved appearance of left middle cerebral artery from prior imaging. 01/05/2016 MRI Brain:No acute or subacute infarction. Old infarctions affecting  the thalami, basal ganglia, genu of the corpus callosum, hemispheric white matter and left and right cortical and subcortical infarctions at the vertex. 07/29/2016 MRI Brain NT:IRWERX multifocal acute ischemic predominantly cortical left MCA territory infarcts. 07/28/2016 CT head/CT perfusion/CTA head and Neck:Possible nonocclusive thrombus in left MCA stent. Abnormally prolonged transit throughout the left MCA superior division without evidence of acute core infarct or large vessel occlusion. Chronically occluded or severely diseased A1 segments. Mild right M1 MCA stenosis. 11/2016 Cerebral angiogram:Approximately 70-75% stenosis within the midportion of the stented segment of left MCA most likely secondary to combination of near intimal hyperplasia and atherosclerotic plaque. Hypoplastic right anterior cerebral artery A1 segment. Nonvisualization of left anterior cerebral artery A1 segment. Dominant posterior communicating arteries bilaterally. 06/07/2017 MRI and MRA of head:No acute intracranial abnormality  or abnormal enhancement. Small chronic infarctions in left frontal centrum semiovale and bilateral caudate heads. Mild chronic small vessel ischemic changes. Long segment flow-void of left M1 corresponding to intra-arterial stent. Bilateral M2 and distal MCA circulation symmetric. Hypoplastic right A1, nonvisualized left A1, and poor downstream flow related to signal in the bilateral anterior cerebral arteries, similar to prior cerebral angiogram. Intracranial atherosclerosis with areas of stable mild to moderate stenosis in the anterior and posterior circulation. 12/20/2017 MRI/MRA of head:MRI of the brain unchanged. Left MCA stent with associated signal loss. 50% stenosis of right M1 distally, unchanged. Nonvisualization of either anterior cerebral artery A1 segments. 03/06/2018 MRI Brain wo:No acute intracranial abnormality. Stable compared to prior imaging. 03/2018 Cerebral angiogram:Stable IntraStent stenosis of 75-80% within previously treated left middle cerebral artery severe stenosis with stent assisted angioplasty. No significant change compared to prior study of February 2018.   PAST MEDICAL HISTORY: Past Medical History:  Diagnosis Date   Anemia    in the past   Asthma    " a long time ago"   Bursitis    Depression    13 years ago -    Diabetes mellitus without complication (HCC)    type  2   GERD (gastroesophageal reflux disease)    occ. acid reflux   Headache    migraines   Hypertension    Kidney stones    PFO (patent foramen ovale)    hx of PFO with surgery done as an adult   Rotator cuff tear    right   Stroke [facility]) 2009   lasdt stroke July 2015   Syncope and collapse     MEDICATIONS: Current Outpatient Medications on File Prior to Visit  Medication Sig Dispense Refill   Atogepant (QULIPTA) 60 MG TABS Take 60 mg by mouth daily. 30 tablet 5   BRILINTA 90 MG TABS tablet Take 90 mg by mouth 2 (two) times daily.   2   chlorthalidone  (HYGROTON) 25 MG tablet Take 25 mg by mouth at bedtime.   5   empagliflozin (JARDIANCE) 25 MG TABS tablet Take 25 mg by mouth daily.     HYDROcodone-acetaminophen (NORCO/VICODIN) 5-325 MG tablet Take 1 tablet by mouth every 4 (four) hours as needed. 10 tablet 0   metFORMIN (GLUCOPHAGE) 1000 MG tablet Take 1,000 mg by mouth 2 (two) times daily with a meal.     ondansetron (ZOFRAN ODT) 4 MG disintegrating tablet Take 1 tablet (4 mg total) by mouth every 8 (eight) hours as needed. (Patient not taking: Reported on 07/06/2020) 10 tablet 0   pravastatin (PRAVACHOL) 80 MG tablet Take 80 mg by mouth at bedtime.      promethazine (PHENERGAN) 25  MG tablet Take 1 tablet (25 mg total) by mouth every 4 (four) hours as needed for nausea or vomiting. 30 tablet 5   REYVOW 100 MG TABS TAKE 1 TABLET BY MOUTH DAILY AS NEEDED. MAXIMUM 1 TABLET IN 24 HOURS 10 tablet 0   Rimegepant Sulfate (NURTEC) 75 MG TBDP Take by mouth.     topiramate ER (QUDEXY XR) 100 MG CS24 sprinkle capsule Take 1 capsule (100 mg total) by mouth daily. 30 capsule 5   Ubrogepant (UBRELVY) 100 MG TABS Take 1 tablet by mouth as needed (May repeat in 2 hours if needed.  Maximum 2 tablets in 24 hours). (Patient not taking: Reported on 02/26/2020) 8 tablet 11   No current facility-administered medications on file prior to visit.    ALLERGIES: Allergies  Allergen Reactions   Imitrex [Sumatriptan] Anaphylaxis   Triptans Anaphylaxis and Hives   Plavix [Clopidogrel Bisulfate] Hives   Cymbalta [Duloxetine Hcl] Nausea Only   Lipitor [Atorvastatin] Rash   Topamax [Topiramate] Other (See Comments)    Memory issues    FAMILY HISTORY: Family History  Problem Relation Age of Onset   Hypertension Mother    Hypertension Father    Cancer Maternal Aunt    Breast cancer Maternal Aunt    Cancer Maternal Uncle     SOCIAL HISTORY: Social History   Socioeconomic History   Marital status: Single    Spouse name: Not on file    Number of children: 4   Years of education: College    Highest education level: Not on file  Occupational History   Occupation: [name]    Employer: [employer]  Tobacco Use   Smoking status: Never Smoker   Smokeless tobacco: Never Used  Vaping Use   Vaping Use: Never used  Substance and Sexual Activity   Alcohol use: Yes    Alcohol/week: 0.0 standard drinks    Comment: occasional   Drug use: No   Sexual activity: Not Currently  Other Topics Concern   Not on file  Social History Narrative   Patient is single with 4 children.   Patient is right handed.   Patient has college education.   Patient drinks 1 cup daily.   One story home   Social Determinants of Health   Financial Resource Strain: Not on file  Food Insecurity: Not on file  Transportation Needs: Not on file  Physical Activity: Not on file  Stress: Not on file  Social Connections: Not on file  Intimate Partner Violence: Not on file     Objective:  Blood pressure (!) 138/97, pulse 98, resp. rate 18, height 5\' 5"  (1.651 m), weight 194 lb (88 kg), SpO2 100 %. General: No acute distress.  Patient appears well-groomed.     Assessment/Plan:   1.  Chronic intractable migraines without aura, without status migrainosus. 2.  Hemiplegic migraine  1.  Migraine prevention:  Stop Qulipta.  Start Vyepti 100mg .  If no improvement in 3 months, will increase to 300mg .  Continue Qudexy 100mg  at bedtime 2.  Migraine rescue: Reyvow 3.  Limit use of pain relievers to no more than 2 days out of week to prevent risk of rebound or medication-overuse headache. 4.  Keep headache diary 5.  Follow up 6 months.  , DO  CC:  , MD

## 2020-12-07 ENCOUNTER — Encounter: Payer: Self-pay | Admitting: Neurology

## 2020-12-07 ENCOUNTER — Other Ambulatory Visit: Payer: Self-pay

## 2020-12-07 ENCOUNTER — Ambulatory Visit: Payer: Medicaid Other | Admitting: Neurology

## 2020-12-07 VITALS — BP 138/97 | HR 98 | Resp 18 | Ht 65.0 in | Wt 194.0 lb

## 2020-12-07 DIAGNOSIS — G43409 Hemiplegic migraine, not intractable, without status migrainosus: Secondary | ICD-10-CM | POA: Diagnosis not present

## 2020-12-07 DIAGNOSIS — G43711 Chronic migraine without aura, intractable, with status migrainosus: Secondary | ICD-10-CM | POA: Diagnosis not present

## 2020-12-07 MED ORDER — REYVOW 100 MG PO TABS: ORAL_TABLET | ORAL | 5 refills | Status: DC

## 2020-12-07 NOTE — Progress Notes (Signed)
Vyepti forms filled out and faxed to [facility].

## 2020-12-07 NOTE — Patient Instructions (Signed)
1.  Stop Qulipta.  Will start Vyepti IV infusion.  Prior to second infusion, give me an update to find out if we need to increase dose 2.  Continue Qudexy/topiramate 100mg  at bedtime 3.  Use Reyvow as needed.  Limit use of pain relievers to no more than 2 days out of week to prevent risk of rebound or medication-overuse headache. 4.  Keep headache diary 5.  Follow up 6 months.

## 2021-02-16 ENCOUNTER — Other Ambulatory Visit: Payer: Self-pay | Admitting: Neurology

## 2021-02-17 ENCOUNTER — Telehealth: Payer: Self-pay | Admitting: Neurology

## 2021-02-17 MED ORDER — TOPIRAMATE ER 100 MG PO SPRINKLE CAP24: 100.0000 mg | EXTENDED_RELEASE_CAPSULE | Freq: Every day | ORAL | 5 refills | Status: DC

## 2021-02-17 NOTE — Telephone Encounter (Signed)
Patient called in stating the Topiramate was sent to the wrong pharmacy. It should be sent to [facility].

## 2021-03-10 ENCOUNTER — Emergency Department (HOSPITAL_COMMUNITY): Payer: Medicaid Other

## 2021-03-10 ENCOUNTER — Observation Stay (HOSPITAL_COMMUNITY): Payer: Medicaid Other

## 2021-03-10 ENCOUNTER — Observation Stay (HOSPITAL_COMMUNITY)
Admission: EM | Admit: 2021-03-10 | Discharge: 2021-03-11 | Disposition: A | Discharge status: Home / Self Care | Payer: Medicaid Other | Attending: Emergency Medicine | Admitting: Emergency Medicine

## 2021-03-10 ENCOUNTER — Other Ambulatory Visit: Payer: Self-pay

## 2021-03-10 ENCOUNTER — Encounter (HOSPITAL_COMMUNITY): Payer: Self-pay | Admitting: Pharmacy Technician

## 2021-03-10 DIAGNOSIS — I10 Essential (primary) hypertension: Secondary | ICD-10-CM | POA: Diagnosis not present

## 2021-03-10 DIAGNOSIS — Z79899 Other long term (current) drug therapy: Secondary | ICD-10-CM | POA: Insufficient documentation

## 2021-03-10 DIAGNOSIS — E1122 Type 2 diabetes mellitus with diabetic chronic kidney disease: Secondary | ICD-10-CM | POA: Diagnosis not present

## 2021-03-10 DIAGNOSIS — Z20822 Contact with and (suspected) exposure to covid-19: Secondary | ICD-10-CM | POA: Diagnosis not present

## 2021-03-10 DIAGNOSIS — Z7984 Long term (current) use of oral hypoglycemic drugs: Secondary | ICD-10-CM | POA: Diagnosis not present

## 2021-03-10 DIAGNOSIS — G43711 Chronic migraine without aura, intractable, with status migrainosus: Secondary | ICD-10-CM | POA: Diagnosis not present

## 2021-03-10 DIAGNOSIS — N289 Disorder of kidney and ureter, unspecified: Secondary | ICD-10-CM | POA: Diagnosis not present

## 2021-03-10 DIAGNOSIS — E118 Type 2 diabetes mellitus with unspecified complications: Secondary | ICD-10-CM

## 2021-03-10 DIAGNOSIS — I63512 Cerebral infarction due to unspecified occlusion or stenosis of left middle cerebral artery: Secondary | ICD-10-CM | POA: Diagnosis not present

## 2021-03-10 DIAGNOSIS — N179 Acute kidney failure, unspecified: Secondary | ICD-10-CM | POA: Insufficient documentation

## 2021-03-10 DIAGNOSIS — J45909 Unspecified asthma, uncomplicated: Secondary | ICD-10-CM | POA: Insufficient documentation

## 2021-03-10 DIAGNOSIS — N182 Chronic kidney disease, stage 2 (mild): Secondary | ICD-10-CM | POA: Diagnosis not present

## 2021-03-10 DIAGNOSIS — R531 Weakness: Secondary | ICD-10-CM

## 2021-03-10 DIAGNOSIS — I129 Hypertensive chronic kidney disease with stage 1 through stage 4 chronic kidney disease, or unspecified chronic kidney disease: Secondary | ICD-10-CM | POA: Insufficient documentation

## 2021-03-10 HISTORY — DX: Acquired absence of both cervix and uterus: Z90.710

## 2021-03-10 LAB — CBC
HCT: 39.8 % (ref 36.0–46.0)
Hemoglobin: 12.7 g/dL (ref 12.0–15.0)
MCH: 28.6 pg (ref 26.0–34.0)
MCHC: 31.9 g/dL (ref 30.0–36.0)
MCV: 89.6 fL (ref 80.0–100.0)
Platelets: 227 10*3/uL (ref 150–400)
RBC: 4.44 MIL/uL (ref 3.87–5.11)
RDW: 12.9 % (ref 11.5–15.5)
WBC: 8.7 10*3/uL (ref 4.0–10.5)
nRBC: 0 % (ref 0.0–0.2)

## 2021-03-10 LAB — BASIC METABOLIC PANEL
Anion gap: 10 (ref 5–15)
BUN: 30 mg/dL — ABNORMAL HIGH (ref 6–20)
CO2: 24 mmol/L (ref 22–32)
Calcium: 9.4 mg/dL (ref 8.9–10.3)
Chloride: 104 mmol/L (ref 98–111)
Creatinine, Ser: 1.75 mg/dL — ABNORMAL HIGH (ref 0.44–1.00)
GFR, Estimated: 34 mL/min — ABNORMAL LOW (ref >60)
Glucose, Bld: 134 mg/dL — ABNORMAL HIGH (ref 70–99)
Potassium: 3.9 mmol/L (ref 3.5–5.1)
Sodium: 138 mmol/L (ref 135–145)

## 2021-03-10 LAB — RESP PANEL BY RT-PCR (FLU A&B, COVID) ARPGX2
Influenza A by PCR: NEGATIVE
Influenza B by PCR: NEGATIVE
SARS Coronavirus 2 by RT PCR: NEGATIVE

## 2021-03-10 MED ORDER — VALPROATE SODIUM 100 MG/ML IV SOLN
1000.0000 mg | Freq: Once | INTRAVENOUS | Status: AC
  Administered 2021-03-11: 1000 mg via INTRAVENOUS
  Filled 2021-03-10: qty 10

## 2021-03-10 MED ORDER — LASMIDITAN SUCCINATE 100 MG PO TABS: 100.0000 mg | ORAL_TABLET | Freq: Every day | ORAL | Status: DC | PRN

## 2021-03-10 MED ORDER — ACETAMINOPHEN 650 MG RE SUPP: 650.0000 mg | Freq: Four times a day (QID) | RECTAL | Status: DC | PRN

## 2021-03-10 MED ORDER — DIPHENHYDRAMINE HCL 50 MG/ML IJ SOLN
50.0000 mg | Freq: Once | INTRAMUSCULAR | Status: AC
  Administered 2021-03-10: 50 mg via INTRAVENOUS
  Filled 2021-03-10: qty 1

## 2021-03-10 MED ORDER — HYDROCODONE-ACETAMINOPHEN 5-325 MG PO TABS
1.0000 | ORAL_TABLET | Freq: Four times a day (QID) | ORAL | Status: DC | PRN
  Administered 2021-03-11 (×2): 2 via ORAL
  Filled 2021-03-10 (×2): qty 2

## 2021-03-10 MED ORDER — TIZANIDINE HCL 2 MG PO TABS: 4.0000 mg | ORAL_TABLET | Freq: Four times a day (QID) | ORAL | Status: DC | PRN

## 2021-03-10 MED ORDER — METOCLOPRAMIDE HCL 5 MG/ML IJ SOLN
10.0000 mg | Freq: Once | INTRAMUSCULAR | Status: AC
  Administered 2021-03-10: 10 mg via INTRAVENOUS
  Filled 2021-03-10: qty 2

## 2021-03-10 MED ORDER — INSULIN ASPART 100 UNIT/ML IJ SOLN: 0.0000 [IU] | Freq: Every day | INTRAMUSCULAR | Status: DC

## 2021-03-10 MED ORDER — HEPARIN SODIUM (PORCINE) 5000 UNIT/ML IJ SOLN
5000.0000 [IU] | Freq: Three times a day (TID) | INTRAMUSCULAR | Status: DC
  Administered 2021-03-11: 5000 [IU] via SUBCUTANEOUS
  Filled 2021-03-10 (×2): qty 1

## 2021-03-10 MED ORDER — TOPIRAMATE ER 100 MG PO SPRINKLE CAP24
100.0000 mg | EXTENDED_RELEASE_CAPSULE | Freq: Every day | ORAL | Status: DC
  Administered 2021-03-11: 100 mg via ORAL
  Filled 2021-03-10 (×2): qty 1

## 2021-03-10 MED ORDER — PRAVASTATIN SODIUM 40 MG PO TABS
80.0000 mg | ORAL_TABLET | Freq: Every day | ORAL | Status: DC
  Administered 2021-03-11: 80 mg via ORAL
  Filled 2021-03-10: qty 2

## 2021-03-10 MED ORDER — IOHEXOL 350 MG/ML SOLN
100.0000 mL | Freq: Once | INTRAVENOUS | Status: AC | PRN
  Administered 2021-03-10: 100 mL via INTRAVENOUS

## 2021-03-10 MED ORDER — LACTATED RINGERS IV BOLUS
1000.0000 mL | Freq: Once | INTRAVENOUS | Status: AC
  Administered 2021-03-11: 1000 mL via INTRAVENOUS

## 2021-03-10 MED ORDER — LORAZEPAM 2 MG/ML IJ SOLN
2.0000 mg | Freq: Once | INTRAMUSCULAR | Status: AC
  Administered 2021-03-10: 2 mg via INTRAVENOUS
  Filled 2021-03-10: qty 1

## 2021-03-10 MED ORDER — TICAGRELOR 90 MG PO TABS
90.0000 mg | ORAL_TABLET | Freq: Two times a day (BID) | ORAL | Status: DC
  Administered 2021-03-11 (×2): 90 mg via ORAL
  Filled 2021-03-10 (×2): qty 1

## 2021-03-10 MED ORDER — INSULIN ASPART 100 UNIT/ML IJ SOLN: 0.0000 [IU] | Freq: Three times a day (TID) | INTRAMUSCULAR | Status: DC

## 2021-03-10 MED ORDER — SENNOSIDES-DOCUSATE SODIUM 8.6-50 MG PO TABS: 1.0000 | ORAL_TABLET | Freq: Every evening | ORAL | Status: DC | PRN

## 2021-03-10 MED ORDER — LORAZEPAM 1 MG PO TABS
2.0000 mg | ORAL_TABLET | ORAL | Status: DC
  Filled 2021-03-10: qty 2

## 2021-03-10 MED ORDER — SODIUM CHLORIDE 0.9 % IV SOLN: 250.0000 mL | INTRAVENOUS | Status: DC | PRN

## 2021-03-10 MED ORDER — LACTATED RINGERS IV BOLUS
1000.0000 mL | Freq: Once | INTRAVENOUS | Status: AC
  Administered 2021-03-10: 1000 mL via INTRAVENOUS

## 2021-03-10 MED ORDER — ONDANSETRON HCL 4 MG/2ML IJ SOLN: 4.0000 mg | Freq: Four times a day (QID) | INTRAMUSCULAR | Status: DC | PRN

## 2021-03-10 MED ORDER — ACETAMINOPHEN 325 MG PO TABS: 650.0000 mg | ORAL_TABLET | Freq: Four times a day (QID) | ORAL | Status: DC | PRN

## 2021-03-10 MED ORDER — ONDANSETRON HCL 4 MG PO TABS: 4.0000 mg | ORAL_TABLET | Freq: Four times a day (QID) | ORAL | Status: DC | PRN

## 2021-03-10 NOTE — ED Triage Notes (Signed)
Pt here via POV with Reports of RLE weakness. Pt with hx CVA with R sided facial droop residual. Pt states her RLE has gotten weaker throughout the day. Pt with slurred speech as well. LKW yesterday 2300. Pt woke up at 0200 and noticed difficulty ambulating due to weakness.

## 2021-03-10 NOTE — ED Notes (Signed)
Pt transported to MRI 

## 2021-03-10 NOTE — H&P (Incomplete)
History and Physical    Patricia SickleDutches D Gulino ZOX:[id number] DOB: 1966/02/21 DOA: 03/10/2021  PCP: Salli RealSun, Yun, MD   Patient coming from: ***  Chief Complaint: ***  HPI: Patricia [name] is a 55 y.o. female with medical history significant for ***   ED Course: Upon arrival to the ED, patient is found to be ***  Review of Systems:  All other systems reviewed and apart from HPI, are negative.  Past Medical History:  Diagnosis Date  . Anemia    in the past  . Asthma    " a long time ago"  . Bursitis   . Depression    13 years ago -   . Diabetes mellitus without complication (HCC)    type  2  . GERD (gastroesophageal reflux disease)    occ. acid reflux  . H/O: hysterectomy   . Headache    migraines  . Hypertension   . Kidney stones   . PFO (patent foramen ovale)    hx of PFO with surgery done as an adult  . Rotator cuff tear    right  . Stroke [facility](HCC) 2009   lasdt stroke July 2015  . Syncope and collapse     Past Surgical History:  Procedure Laterality Date  . ABDOMINAL HYSTERECTOMY    . BREAST REDUCTION SURGERY    . CARDIAC CATHETERIZATION    . CARDIAC SURGERY  Closed hole in heart in 2009   Amplatzer Septal Occluder Ref [id number] (not listed in op notes, document scanned under MRI with written op notes  . HYSTEROTOMY    . IR ANGIO INTRA EXTRACRAN SEL COM CAROTID INNOMINATE BILAT MOD SED  04/09/2018  . IR ANGIO VERTEBRAL SEL SUBCLAVIAN INNOMINATE UNI R MOD SED  04/09/2018  . IR GENERIC HISTORICAL  06/23/2016   IR ANGIO INTRA EXTRACRAN SEL COM CAROTID INNOMINATE BILAT MOD SED 06/23/2016 Julieanne CottonSanjeev Deveshwar, MD [facility]  . IR GENERIC HISTORICAL  06/23/2016   IR ANGIO VERTEBRAL SEL VERTEBRAL BILAT MOD SED 06/23/2016 Julieanne CottonSanjeev Deveshwar, MD [facility]  . IR GENERIC HISTORICAL  06/29/2016   IR RADIOLOGIST EVAL & MGMT 06/29/2016 [facility]  . IR GENERIC HISTORICAL  07/24/2016   IR INTRA CRAN STENT 07/24/2016 Julieanne CottonSanjeev Deveshwar, MD [facility]  . IR GENERIC HISTORICAL   07/28/2016   IR PTA INTRACRANIAL 07/28/2016 Julieanne CottonSanjeev Deveshwar, MD [facility]  . IR GENERIC HISTORICAL  07/28/2016   IR ENDOVASC INTRACRANIAL INF OTHER THAN THROMBO ART INC DIAG ANGIO 07/28/2016 Julieanne CottonSanjeev Deveshwar, MD [facility]  . IR GENERIC HISTORICAL  08/07/2016   IR RADIOLOGIST EVAL & MGMT 08/07/2016 [facility]  . IR GENERIC HISTORICAL  12/05/2016   IR ANGIO INTRA EXTRACRAN SEL COM CAROTID INNOMINATE BILAT MOD SED 12/05/2016 Julieanne CottonSanjeev Deveshwar, MD [facility]  . IR GENERIC HISTORICAL  12/05/2016   IR ANGIO VERTEBRAL SEL SUBCLAVIAN INNOMINATE UNI R MOD SED 12/05/2016 Julieanne CottonSanjeev Deveshwar, MD [facility]  . IR GENERIC HISTORICAL  12/05/2016   IR ANGIO VERTEBRAL SEL VERTEBRAL UNI L MOD SED 12/05/2016 Julieanne CottonSanjeev Deveshwar, MD [facility]  . RADIOLOGY WITH ANESTHESIA [name] 07/24/2016   Procedure: Stenting;  Surgeon: Julieanne CottonSanjeev Deveshwar, MD;  Location: [facility] OR;  Service: Radiology;  Laterality: [name];  . RADIOLOGY WITH ANESTHESIA [name] 07/28/2016   Procedure: RADIOLOGY WITH ANESTHESIA;  Surgeon: Medication Radiologist, MD;  Location: [facility] OR;  Service: Radiology;  Laterality: [name];  . REDUCTION MAMMAPLASTY Bilateral   . SHOULDER ARTHROSCOPY WITH OPEN ROTATOR CUFF REPAIR AND DISTAL CLAVICLE ACROMINECTOMY Right 02/04/2015  Procedure: SHOULDER ARTHROSCOPY WITH SUBACROMIAL DECOMPRESSION AND OPEN ROTATOR CUFF REPAIR,;  Surgeon: Cammy Copa, MD;  [facility] OR;  Service: Orthopedics;  Laterality: Right;  RIGHT SHOULDER DOA,SAD,MINI-OPEN ROTATOR CUFF TEAR REPAIR.    Social History:   reports that she has never smoked. She has never used smokeless tobacco. She reports current alcohol use. She reports that she does not use drugs.  Allergies  Allergen Reactions  . Imitrex [Sumatriptan] Anaphylaxis  . Triptans Anaphylaxis and Hives  . Plavix [Clopidogrel Bisulfate] Hives  . Zofran [Ondansetron] Nausea Only  . Cymbalta [Duloxetine Hcl] Nausea Only  . Lipitor [Atorvastatin] Rash  . Topamax [Topiramate] Other  (See Comments)    Memory issues    Family History  Problem Relation Age of Onset  . Hypertension Mother   . Hypertension Father   . Cancer Maternal Aunt   . Breast cancer Maternal Aunt   . Cancer Maternal Uncle      Prior to Admission medications   Medication Sig Start Date End Date Taking? Authorizing Provider  BRILINTA 90 MG TABS tablet Take 90 mg by mouth 2 (two) times daily.  01/15/18  Yes [provider]  cetirizine (ZYRTEC) 10 MG tablet Take 10 mg by mouth at bedtime.   Yes [provider]  chlorthalidone (HYGROTON) 25 MG tablet Take 25 mg by mouth at bedtime.  09/03/15  Yes [provider]  Cholecalciferol (VITAMIN D-3) 25 MCG (1000 UT) CAPS Take 1,000 Units by mouth at bedtime.   Yes [provider]  HYDROcodone-acetaminophen (NORCO/VICODIN) 5-325 MG tablet Take 1 tablet by mouth every 4 (four) hours as needed. Patient taking differently: Take 1 tablet by mouth every 4 (four) hours as needed (for pain). 11/28/19  Yes Jacalyn Lefevre, MD  JARDIANCE 25 MG TABS tablet Take 25 mg by mouth at bedtime.   Yes [provider]  Lasmiditan Succinate (REYVOW) 100 MG TABS TAKE 1 TABLET BY MOUTH DAILY AS NEEDED. MAXIMUM 1 TABLET IN 24 HOURS Patient taking differently: Take 100 mg by mouth daily as needed (for migraines- MAX OF #1 TABLET/24 HOURS). 12/07/20  Yes Jaffe, Adam R, DO  metFORMIN (GLUCOPHAGE) 1000 MG tablet Take 1,000 mg by mouth 2 (two) times daily with a meal.   Yes [provider]  pravastatin (PRAVACHOL) 80 MG tablet Take 80 mg by mouth at bedtime.    Yes [provider]  promethazine (PHENERGAN) 25 MG tablet Take 1 tablet (25 mg total) by mouth every 4 (four) hours as needed for nausea or vomiting. 10/14/20  Yes Jaffe, Adam R, DO  tiZANidine (ZANAFLEX) 4 MG tablet Take 4 mg by mouth every 6 (six) hours as needed for muscle spasms.   Yes [provider]  topiramate ER (QUDEXY XR) 100 MG CS24 sprinkle capsule  Take 1 capsule (100 mg total) by mouth daily. Patient taking differently: Take 100 mg by mouth at bedtime. 02/17/21  Yes Jaffe, Adam R, DO  empagliflozin (JARDIANCE) 25 MG TABS tablet Take 25 mg by mouth daily. Patient not taking: No sig reported    [provider]  ondansetron (ZOFRAN ODT) 4 MG disintegrating tablet Take 1 tablet (4 mg total) by mouth every 8 (eight) hours as needed. Patient not taking: No sig reported 11/28/19   Jacalyn Lefevre, MD  REYVOW 100 MG TABS Take 100 mg by mouth daily as needed (for migraines). Patient not taking: No sig reported    [provider]    Physical Exam: Vitals:   03/10/21 2001  03/10/21 2045 03/10/21 2130 03/10/21 2240  BP:  (!) 145/91 118/76   Pulse:  74 82   Resp:  13 13   Temp: 98.8 F (37.1 C)   99.1 F (37.3 C)  TempSrc: Oral   Oral  SpO2:  99% 96%      Constitutional: NAD, calm  Eyes: PERTLA, lids and conjunctivae normal ENMT: Mucous membranes are moist. Posterior pharynx clear of any exudate or lesions.   Neck: normal, supple, no masses, no thyromegaly Respiratory: clear to auscultation bilaterally, no wheezing, no crackles. No accessory muscle use.  Cardiovascular: S1 & S2 heard, regular rate and rhythm. No extremity edema. No significant JVD. Abdomen: No distension, no tenderness, soft. Bowel sounds active.  Musculoskeletal: no clubbing / cyanosis. No joint deformity upper and lower extremities.   Skin: no significant rashes, lesions, ulcers. Warm, dry, well-perfused. Neurologic: CN 2-12 grossly intact. Sensation intact, DTR normal. Strength 5/5 in all 4 limbs.  Psychiatric: Alert and oriented to person, place, and situation. Pleasant and cooperative.    Labs and Imaging on Admission: I have personally reviewed following labs and imaging studies  CBC: Recent Labs  Lab 03/10/21 1544  WBC 8.7  HGB 12.7  HCT 39.8  MCV 89.6  PLT 227   Basic Metabolic Panel: Recent Labs  Lab 03/10/21 1544  NA 138  K 3.9   CL 104  CO2 24  GLUCOSE 134*  BUN 30*  CREATININE 1.75*  CALCIUM 9.4   GFR: CrCl cannot be calculated (Unknown ideal weight.). Liver Function Tests: No results for input(s): AST, ALT, ALKPHOS, BILITOT, PROT, ALBUMIN in the last 168 hours. No results for input(s): LIPASE, AMYLASE in the last 168 hours. No results for input(s): AMMONIA in the last 168 hours. Coagulation Profile: No results for input(s): INR, PROTIME in the last 168 hours. Cardiac Enzymes: No results for input(s): CKTOTAL, CKMB, CKMBINDEX, TROPONINI in the last 168 hours. BNP (last 3 results) No results for input(s): PROBNP in the last 8760 hours. HbA1C: No results for input(s): HGBA1C in the last 72 hours. CBG: No results for input(s): GLUCAP in the last 168 hours. Lipid Profile: No results for input(s): CHOL, HDL, LDLCALC, TRIG, CHOLHDL, LDLDIRECT in the last 72 hours. Thyroid Function Tests: No results for input(s): TSH, T4TOTAL, FREET4, T3FREE, THYROIDAB in the last 72 hours. Anemia Panel: No results for input(s): VITAMINB12, FOLATE, FERRITIN, TIBC, IRON, RETICCTPCT in the last 72 hours. Urine analysis:    Component Value Date/Time   COLORURINE YELLOW 12/24/2018 2121   APPEARANCEUR CLEAR 12/24/2018 2121   LABSPEC 1.030 12/24/2018 2121   PHURINE 6.0 12/24/2018 2121   GLUCOSEU >=500 (A) 12/24/2018 2121   HGBUR SMALL (A) 12/24/2018 2121   BILIRUBINUR NEGATIVE 12/24/2018 2121   KETONESUR NEGATIVE 12/24/2018 2121   PROTEINUR NEGATIVE 12/24/2018 2121   UROBILINOGEN 0.2 02/21/2015 0900   NITRITE NEGATIVE 12/24/2018 2121   LEUKOCYTESUR NEGATIVE 12/24/2018 2121   Sepsis Labs: @LABRCNTIP (procalcitonin:4,lacticidven:4) ) Recent Results (from the past 240 hour(s))  Resp Panel by RT-PCR (Flu A&B, Covid) Nasopharyngeal Swab     Status: None   Collection Time: 03/10/21  8:49 PM   Specimen: Nasopharyngeal Swab; Nasopharyngeal(NP) swabs in vial transport medium  Result Value Ref Range Status   SARS Coronavirus  2 by RT PCR NEGATIVE NEGATIVE Final    Comment: (NOTE) SARS-CoV-2 target nucleic acids are NOT DETECTED.  The SARS-CoV-2 RNA is generally detectable in upper respiratory specimens during the acute phase of infection. The lowest concentration of SARS-CoV-2 viral copies this  assay can detect is 138 copies/mL. A negative result does not preclude SARS-Cov-2 infection and should not be used as the sole basis for treatment or other patient management decisions. A negative result may occur with  improper specimen collection/handling, submission of specimen other than nasopharyngeal swab, presence of viral mutation(s) within the areas targeted by this assay, and inadequate number of viral copies(<138 copies/mL). A negative result must be combined with clinical observations, patient history, and epidemiological information. The expected result is Negative.  Fact Sheet for Patients:  BloggerCourse.com  Fact Sheet for Healthcare Providers:  SeriousBroker.it  This test is no t yet approved or cleared by the [place] FDA and  has been authorized for detection and/or diagnosis of SARS-CoV-2 by FDA under an Emergency Use Authorization (EUA). This EUA will remain  in effect (meaning this test can be used) for the duration of the COVID-19 declaration under Section 564(b)(1) of the Act, 21 U.S.C.section 360bbb-3(b)(1), unless the authorization is terminated  or revoked sooner.       Influenza A by PCR NEGATIVE NEGATIVE Final   Influenza B by PCR NEGATIVE NEGATIVE Final    Comment: (NOTE) The Xpert Xpress SARS-CoV-2/FLU/RSV plus assay is intended as an aid in the diagnosis of influenza from Nasopharyngeal swab specimens and should not be used as a sole basis for treatment. Nasal washings and aspirates are unacceptable for Xpert Xpress SARS-CoV-2/FLU/RSV testing.  Fact Sheet for Patients: BloggerCourse.com  Fact  Sheet for Healthcare Providers: SeriousBroker.it  This test is not yet approved or cleared by the [place] FDA and has been authorized for detection and/or diagnosis of SARS-CoV-2 by FDA under an Emergency Use Authorization (EUA). This EUA will remain in effect (meaning this test can be used) for the duration of the COVID-19 declaration under Section 564(b)(1) of the Act, 21 U.S.C. section 360bbb-3(b)(1), unless the authorization is terminated or revoked.  Performed at [facility], [address], [place], [place] [address]      Radiological Exams on Admission: CT Head Wo Contrast  Result Date: 03/10/2021 CLINICAL DATA:  Right leg weakness EXAM: CT HEAD WITHOUT CONTRAST TECHNIQUE: Contiguous axial images were obtained from the base of the skull through the vertex without intravenous contrast. COMPARISON:  01/27/2020 FINDINGS: Brain: Stable scattered white matter microvascular changes about the lateral ventricles and in the high anterior frontal lobes bilaterally. No acute intracranial hemorrhage, new mass lesion, new infarction, midline shift, herniation, hydrocephalus, or extra-axial fluid collection. Vascular: Left MCA intravascular stent noted. No hyperdense abnormal vessel appreciated. Skull: Normal. Negative for fracture or focal lesion. Sinuses/Orbits: No acute finding. Other: None. IMPRESSION: Stable chronic white matter microvascular changes. No acute intracranial abnormality or interval change by noncontrast imaging. Electronically Signed   By: Judie Petit.  Shick M.D.   On: 03/10/2021 16:31   MR BRAIN WO CONTRAST  Result Date: 03/10/2021 CLINICAL DATA:  Right lower extremity weakness EXAM: MRI HEAD WITHOUT CONTRAST TECHNIQUE: Multiplanar, multiecho pulse sequences of the brain and surrounding structures were obtained without intravenous contrast. COMPARISON:  None. FINDINGS: Brain: No acute infarct, mass effect or extra-axial collection. Small chronic  infarcts of the corona radiata bilaterally. Normal white matter signal, parenchymal volume and CSF spaces. The midline structures are normal. Vascular: Major flow voids are preserved. Skull and upper cervical spine: Normal calvarium and skull base. Visualized upper cervical spine and soft tissues are normal. Sinuses/Orbits:No paranasal sinus fluid levels or advanced mucosal thickening. No mastoid or middle ear effusion. Normal orbits. IMPRESSION: 1. No acute intracranial abnormality.  2. Small chronic infarcts of the corona radiata bilaterally. Electronically Signed   By: Deatra Robinson M.D.   On: 03/10/2021 23:43   CT ANGIO HEAD NECK W WO CM W PERF (CODE STROKE)  Result Date: 03/10/2021 CLINICAL DATA:  Right lower extremity weakness EXAM: CT ANGIOGRAPHY HEAD AND NECK CT PERFUSION BRAIN TECHNIQUE: Multidetector CT imaging of the head and neck was performed using the standard protocol during bolus administration of intravenous contrast. Multiplanar CT image reconstructions and MIPs were obtained to evaluate the vascular anatomy. Carotid stenosis measurements (when applicable) are obtained utilizing NASCET criteria, using the distal internal carotid diameter as the denominator. Multiphase CT imaging of the brain was performed following IV bolus contrast injection. Subsequent parametric perfusion maps were calculated using RAPID software. CONTRAST:  OMNIPAQUE IOHEXOL 350 MG/ML SOLN COMPARISON:  08/08/2019 FINDINGS: CTA NECK FINDINGS SKELETON: There is no bony spinal canal stenosis. No lytic or blastic lesion. OTHER NECK: Normal pharynx, larynx and major salivary glands. No cervical lymphadenopathy. Unremarkable thyroid gland. UPPER CHEST: No pneumothorax or pleural effusion. No nodules or masses. AORTIC ARCH: There is no calcific atherosclerosis of the aortic arch. There is no aneurysm, dissection or hemodynamically significant stenosis of the visualized portion of the aorta. Conventional 3 vessel aortic  branching pattern. The visualized proximal subclavian arteries are widely patent. RIGHT CAROTID SYSTEM: Normal without aneurysm, dissection or stenosis. LEFT CAROTID SYSTEM: Normal without aneurysm, dissection or stenosis. VERTEBRAL ARTERIES: Left dominant configuration. Both origins are clearly patent. There is no dissection, occlusion or flow-limiting stenosis to the skull base (V1-V3 segments). CTA HEAD FINDINGS POSTERIOR CIRCULATION: --Vertebral arteries: Normal V4 segments. --Inferior cerebellar arteries: Normal. --Basilar artery: Normal. --Superior cerebellar arteries: Normal. --Posterior cerebral arteries (PCA): Normal. ANTERIOR CIRCULATION: --Intracranial internal carotid arteries: Atherosclerotic calcification of the internal carotid arteries at the skull base without hemodynamically significant stenosis. --Anterior cerebral arteries (ACA): Unchanged occlusion of both A1 segments. Worsened opacification of the A2 segments. --Middle cerebral arteries (MCA): Unchanged position of left MCA stent with preserved patency. Mild irregularity of the right MCA is unchanged. VENOUS SINUSES: As permitted by contrast timing, patent. ANATOMIC VARIANTS: None Review of the MIP images confirms the above findings. CT Brain Perfusion Findings: ASPECTS: 10 CBF (<30%) Volume: 22mL Perfusion (Tmax>6.0s) volume: 49mL. Of note, using of thrush old of 4 seconds, there is a 19 mL area of abnormality within the frontal lobes. Mismatch Volume: 43mL Infarction Location:None IMPRESSION: 1. No emergent large vessel occlusion or high-grade stenosis of the intracranial arteries. 2. No acute ischemia by standard CT perfusion criteria. Using a lower threshold for Tmax shows areas of abnormality within the left-greater-than-right frontal lobes within the ACA territories. 3. Unchanged occlusion of both A1 segments with worsened opacification of the A2 segments. This vascular distribution could contribute to lower extremity symptoms. 4. Unchanged  position of left MCA stent with preserved patency. Electronically Signed   By: Deatra Robinson M.D.   On: 03/10/2021 19:54    EKG: Independently reviewed. Sinus rhythm.   Assessment/Plan   1. Renal insufficiency  - SCr is 1.75 on admission, up from 1.10 a year ago  - She is receiving a 2nd liter of IVF in ED  -  -  -  -  -  -   2. Right leg weakness; hx of CVA - Patient with history of CVAs with residual right-sided motor deficit presents with headache and RLE weakness that she noticed on waking but not at ~23:00 the night before  - No acute findings on  head CT  - Neurology recommended MRI brain which is pending  - Follow-up MRI and consult neurology if there is new CVA, continue statin and Brilinta    3. Chronic headache  - Reported severe headache on arrival similar to her usual headache  - No acute findings on head CT  - Resolved after treatment in ED with Reglan, Benadryl, and Depakote  - Continue topiramate    4. Type II DM  - No recent A1c  - Update A1c, use low-intensity SSI for now   5. Hypertension  - Hold chlorthalidone while hydrating, treat as-needed only for now     DVT prophylaxis: sq heparin  Code Status: Full   Level of Care: Level of care: Telemetry Medical Family Communication: None available  Disposition Plan:  Patient is from: Home  Anticipated d/c is to: Home  Anticipated d/c date is: 03/11/21 Patient currently: Pending MRI brain, repeat chem panel  Consults called: None  Admission status:  Observation    Briscoe Deutscher, MD [facility]  03/10/2021, 11:55 PM

## 2021-03-10 NOTE — ED Notes (Signed)
Pt assisted to restroom.  

## 2021-03-10 NOTE — ED Notes (Signed)
Pt is in CT at this time.

## 2021-03-10 NOTE — H&P (Addendum)
History and Physical    Patricia SickleDutches D Rump OZH:[id number] DOB: 11/15/1965 DOA: 03/10/2021  PCP: Salli RealSun, Yun, MD   Patient coming from: Home   Chief Complaint: Headache, right leg weakness   HPI: Patricia [name] is a 55 y.o. female with medical history significant for hypertension, type 2 diabetes mellitus, history of CVA with residual deficits, and chronic migraine, now presenting to the emergency department with headache and right lower extremity weakness.  Patient reports that she was in her usual state at approximately 11 PM last night but noticed her right leg was weak when she woke up at approximately 2 AM.  Right leg weakness has persisted throughout the day and the patient also has a severe posterior headache similar to her chronic headaches.  She denies any chest pain, palpitations, numbness, acute low back pain, injury, fevers, or chills.  ED Course: Upon arrival to the ED, patient is found to be afebrile, saturating well on room air, and with stable blood pressure.  EKG features a sinus rhythm.  Noncontrast head CT negative for acute intracranial abnormality.  ED physician discussed the case with neurology who recommended MRI brain.  Patient was treated with IV Depakote, Reglan, Benadryl, 2 L of IV fluids, and 2 mg IV Ativan in the emergency department.  Hospitalist were asked to admit for renal insufficiency, possibly acute.  Review of Systems:  All other systems reviewed and apart from HPI, are negative.  Past Medical History:  Diagnosis Date  . Anemia    in the past  . Asthma    " a long time ago"  . Bursitis   . Depression    13 years ago -   . Diabetes mellitus without complication (HCC)    type  2  . GERD (gastroesophageal reflux disease)    occ. acid reflux  . H/O: hysterectomy   . Headache    migraines  . Hypertension   . Kidney stones   . PFO (patent foramen ovale)    hx of PFO with surgery done as an adult  . Rotator cuff tear    right  . Stroke [facility](HCC)  2009   lasdt stroke July 2015  . Syncope and collapse     Past Surgical History:  Procedure Laterality Date  . ABDOMINAL HYSTERECTOMY    . BREAST REDUCTION SURGERY    . CARDIAC CATHETERIZATION    . CARDIAC SURGERY  Closed hole in heart in 2009   Amplatzer Septal Occluder Ref [id number] (not listed in op notes, document scanned under MRI with written op notes  . HYSTEROTOMY    . IR ANGIO INTRA EXTRACRAN SEL COM CAROTID INNOMINATE BILAT MOD SED  04/09/2018  . IR ANGIO VERTEBRAL SEL SUBCLAVIAN INNOMINATE UNI R MOD SED  04/09/2018  . IR GENERIC HISTORICAL  06/23/2016   IR ANGIO INTRA EXTRACRAN SEL COM CAROTID INNOMINATE BILAT MOD SED 06/23/2016 Julieanne CottonSanjeev Deveshwar, MD [facility]  . IR GENERIC HISTORICAL  06/23/2016   IR ANGIO VERTEBRAL SEL VERTEBRAL BILAT MOD SED 06/23/2016 Julieanne CottonSanjeev Deveshwar, MD [facility]  . IR GENERIC HISTORICAL  06/29/2016   IR RADIOLOGIST EVAL & MGMT 06/29/2016 [facility]  . IR GENERIC HISTORICAL  07/24/2016   IR INTRA CRAN STENT 07/24/2016 Julieanne CottonSanjeev Deveshwar, MD [facility]  . IR GENERIC HISTORICAL  07/28/2016   IR PTA INTRACRANIAL 07/28/2016 Julieanne CottonSanjeev Deveshwar, MD [facility]  . IR GENERIC HISTORICAL  07/28/2016   IR ENDOVASC INTRACRANIAL INF OTHER THAN THROMBO ART INC DIAG ANGIO 07/28/2016 Julieanne CottonSanjeev Deveshwar, MD  [facility]  . IR GENERIC HISTORICAL  08/07/2016   IR RADIOLOGIST EVAL & MGMT 08/07/2016 [facility]  . IR GENERIC HISTORICAL  12/05/2016   IR ANGIO INTRA EXTRACRAN SEL COM CAROTID INNOMINATE BILAT MOD SED 12/05/2016 Julieanne Cotton, MD [facility]  . IR GENERIC HISTORICAL  12/05/2016   IR ANGIO VERTEBRAL SEL SUBCLAVIAN INNOMINATE UNI R MOD SED 12/05/2016 Julieanne Cotton, MD [facility]  . IR GENERIC HISTORICAL  12/05/2016   IR ANGIO VERTEBRAL SEL VERTEBRAL UNI L MOD SED 12/05/2016 Julieanne Cotton, MD [facility]  . RADIOLOGY WITH ANESTHESIA [name] 07/24/2016   Procedure: Stenting;  Surgeon: Julieanne Cotton, MD;  Location: [facility];  Service: Radiology;   Laterality: [name];  . RADIOLOGY WITH ANESTHESIA [name] 07/28/2016   Procedure: RADIOLOGY WITH ANESTHESIA;  Surgeon: Medication Radiologist, MD;  Location: [facility];  Service: Radiology;  Laterality: [name];  . REDUCTION MAMMAPLASTY Bilateral   . SHOULDER ARTHROSCOPY WITH OPEN ROTATOR CUFF REPAIR AND DISTAL CLAVICLE ACROMINECTOMY Right 02/04/2015   Procedure: SHOULDER ARTHROSCOPY WITH SUBACROMIAL DECOMPRESSION AND OPEN ROTATOR CUFF REPAIR,;  Surgeon: Cammy Copa, MD;  Location: [facility];  Service: Orthopedics;  Laterality: Right;  RIGHT SHOULDER DOA,SAD,MINI-OPEN ROTATOR CUFF TEAR REPAIR.    Social History:   reports that she has never smoked. She has never used smokeless tobacco. She reports current alcohol use. She reports that she does not use drugs.  Allergies  Allergen Reactions  . Imitrex [Sumatriptan] Anaphylaxis  . Triptans Anaphylaxis and Hives  . Plavix [Clopidogrel Bisulfate] Hives  . Zofran [Ondansetron] Nausea Only  . Cymbalta [Duloxetine Hcl] Nausea Only  . Lipitor [Atorvastatin] Rash  . Topamax [Topiramate] Other (See Comments)    Memory issues    Family History  Problem Relation Age of Onset  . Hypertension Mother   . Hypertension Father   . Cancer Maternal Aunt   . Breast cancer Maternal Aunt   . Cancer Maternal Uncle      Prior to Admission medications   Medication Sig Start Date End Date Taking? Authorizing Provider  BRILINTA 90 MG TABS tablet Take 90 mg by mouth 2 (two) times daily.  01/15/18  Yes [provider]  cetirizine (ZYRTEC) 10 MG tablet Take 10 mg by mouth at bedtime.   Yes [provider]  chlorthalidone (HYGROTON) 25 MG tablet Take 25 mg by mouth at bedtime.  09/03/15  Yes [provider]  Cholecalciferol (VITAMIN D-3) 25 MCG (1000 UT) CAPS Take 1,000 Units by mouth at bedtime.   Yes [provider]  HYDROcodone-acetaminophen (NORCO/VICODIN) 5-325 MG tablet Take 1 tablet by mouth every 4 (four) hours as needed. Patient  taking differently: Take 1 tablet by mouth every 4 (four) hours as needed (for pain). 11/28/19  Yes Jacalyn Lefevre, MD  JARDIANCE 25 MG TABS tablet Take 25 mg by mouth at bedtime.   Yes [provider]  Lasmiditan Succinate (REYVOW) 100 MG TABS TAKE 1 TABLET BY MOUTH DAILY AS NEEDED. MAXIMUM 1 TABLET IN 24 HOURS Patient taking differently: Take 100 mg by mouth daily as needed (for migraines- MAX OF #1 TABLET/24 HOURS). 12/07/20  Yes Jaffe, Adam R, DO  metFORMIN (GLUCOPHAGE) 1000 MG tablet Take 1,000 mg by mouth 2 (two) times daily with a meal.   Yes [provider]  pravastatin (PRAVACHOL) 80 MG tablet Take 80 mg by mouth at bedtime.    Yes [provider]  promethazine (PHENERGAN) 25 MG tablet Take 1 tablet (25 mg total) by mouth every 4 (four) hours  as needed for nausea or vomiting. 10/14/20  Yes Jaffe, Adam R, DO  tiZANidine (ZANAFLEX) 4 MG tablet Take 4 mg by mouth every 6 (six) hours as needed for muscle spasms.   Yes [provider]  topiramate ER (QUDEXY XR) 100 MG CS24 sprinkle capsule Take 1 capsule (100 mg total) by mouth daily. Patient taking differently: Take 100 mg by mouth at bedtime. 02/17/21  Yes Jaffe, Adam R, DO  empagliflozin (JARDIANCE) 25 MG TABS tablet Take 25 mg by mouth daily. Patient not taking: No sig reported    [provider]  ondansetron (ZOFRAN ODT) 4 MG disintegrating tablet Take 1 tablet (4 mg total) by mouth every 8 (eight) hours as needed. Patient not taking: No sig reported 11/28/19   Jacalyn Lefevre, MD  REYVOW 100 MG TABS Take 100 mg by mouth daily as needed (for migraines). Patient not taking: No sig reported    [provider]    Physical Exam: Vitals:   03/10/21 2001 03/10/21 2045 03/10/21 2130 03/10/21 2240  BP:  (!) 145/91 118/76   Pulse:  74 82   Resp:  13 13   Temp: 98.8 F (37.1 C)   99.1 F (37.3 C)  TempSrc: Oral   Oral  SpO2:  99% 96%     Constitutional: NAD, calm  Eyes: PERTLA, lids and  conjunctivae normal ENMT: Mucous membranes are moist. Posterior pharynx clear of any exudate or lesions.   Neck:  supple, no masses  Respiratory: clear to auscultation bilaterally, no wheezing, no crackles. No accessory muscle use.  Cardiovascular: S1 & S2 heard, regular rate and rhythm. No extremity edema.   Abdomen: No distension, no tenderness, soft. Bowel sounds active.  Musculoskeletal: no clubbing / cyanosis. No joint deformity upper and lower extremities.   Skin: no significant rashes, lesions, ulcers. Warm, dry, well-perfused. Neurologic: CN 2-12 grossly intact. Sensation intact. Moving all extremities, strength testing limited by patient somnolence after 2 mg IV Ativan was given for MRI.   Psychiatric: Sleeping, wakes to loud voice. Somnolent, oriented to person, place, and situation.    Labs and Imaging on Admission: I have personally reviewed following labs and imaging studies  CBC: Recent Labs  Lab 03/10/21 1544  WBC 8.7  HGB 12.7  HCT 39.8  MCV 89.6  PLT 227   Basic Metabolic Panel: Recent Labs  Lab 03/10/21 1544  NA 138  K 3.9  CL 104  CO2 24  GLUCOSE 134*  BUN 30*  CREATININE 1.75*  CALCIUM 9.4   GFR: CrCl cannot be calculated (Unknown ideal weight.). Liver Function Tests: No results for input(s): AST, ALT, ALKPHOS, BILITOT, PROT, ALBUMIN in the last 168 hours. No results for input(s): LIPASE, AMYLASE in the last 168 hours. No results for input(s): AMMONIA in the last 168 hours. Coagulation Profile: No results for input(s): INR, PROTIME in the last 168 hours. Cardiac Enzymes: No results for input(s): CKTOTAL, CKMB, CKMBINDEX, TROPONINI in the last 168 hours. BNP (last 3 results) No results for input(s): PROBNP in the last 8760 hours. HbA1C: No results for input(s): HGBA1C in the last 72 hours. CBG: No results for input(s): GLUCAP in the last 168 hours. Lipid Profile: No results for input(s): CHOL, HDL, LDLCALC, TRIG, CHOLHDL, LDLDIRECT in the last  72 hours. Thyroid Function Tests: No results for input(s): TSH, T4TOTAL, FREET4, T3FREE, THYROIDAB in the last 72 hours. Anemia Panel: No results for input(s): VITAMINB12, FOLATE, FERRITIN, TIBC, IRON, RETICCTPCT in the last 72 hours. Urine analysis:  Component Value Date/Time   COLORURINE YELLOW 12/24/2018 2121   APPEARANCEUR CLEAR 12/24/2018 2121   LABSPEC 1.030 12/24/2018 2121   PHURINE 6.0 12/24/2018 2121   GLUCOSEU >=500 (A) 12/24/2018 2121   HGBUR SMALL (A) 12/24/2018 2121   BILIRUBINUR NEGATIVE 12/24/2018 2121   KETONESUR NEGATIVE 12/24/2018 2121   PROTEINUR NEGATIVE 12/24/2018 2121   UROBILINOGEN 0.2 02/21/2015 0900   NITRITE NEGATIVE 12/24/2018 2121   LEUKOCYTESUR NEGATIVE 12/24/2018 2121   Sepsis Labs: @LABRCNTIP (procalcitonin:4,lacticidven:4) ) Recent Results (from the past 240 hour(s))  Resp Panel by RT-PCR (Flu A&B, Covid) Nasopharyngeal Swab     Status: None   Collection Time: 03/10/21  8:49 PM   Specimen: Nasopharyngeal Swab; Nasopharyngeal(NP) swabs in vial transport medium  Result Value Ref Range Status   SARS Coronavirus 2 by RT PCR NEGATIVE NEGATIVE Final    Comment: (NOTE) SARS-CoV-2 target nucleic acids are NOT DETECTED.  The SARS-CoV-2 RNA is generally detectable in upper respiratory specimens during the acute phase of infection. The lowest concentration of SARS-CoV-2 viral copies this assay can detect is 138 copies/mL. A negative result does not preclude SARS-Cov-2 infection and should not be used as the sole basis for treatment or other patient management decisions. A negative result may occur with  improper specimen collection/handling, submission of specimen other than nasopharyngeal swab, presence of viral mutation(s) within the areas targeted by this assay, and inadequate number of viral copies(<138 copies/mL). A negative result must be combined with clinical observations, patient history, and epidemiological information. The expected result  is Negative.  Fact Sheet for Patients:  03/12/21  Fact Sheet for Healthcare Providers:  BloggerCourse.com  This test is no t yet approved or cleared by the [employer] FDA and  has been authorized for detection and/or diagnosis of SARS-CoV-2 by FDA under an Emergency Use Authorization (EUA). This EUA will remain  in effect (meaning this test can be used) for the duration of the COVID-19 declaration under Section 564(b)(1) of the Act, 21 U.S.C.section 360bbb-3(b)(1), unless the authorization is terminated  or revoked sooner.       Influenza A by PCR NEGATIVE NEGATIVE Final   Influenza B by PCR NEGATIVE NEGATIVE Final    Comment: (NOTE) The Xpert Xpress SARS-CoV-2/FLU/RSV plus assay is intended as an aid in the diagnosis of influenza from Nasopharyngeal swab specimens and should not be used as a sole basis for treatment. Nasal washings and aspirates are unacceptable for Xpert Xpress SARS-CoV-2/FLU/RSV testing.  Fact Sheet for Patients: [place]  Fact Sheet for Healthcare Providers: BloggerCourse.com  This test is not yet approved or cleared by the [employer] FDA and has been authorized for detection and/or diagnosis of SARS-CoV-2 by FDA under an Emergency Use Authorization (EUA). This EUA will remain in effect (meaning this test can be used) for the duration of the COVID-19 declaration under Section 564(b)(1) of the Act, 21 U.S.C. section 360bbb-3(b)(1), unless the authorization is terminated or revoked.  Performed at [facility], [address], [place], [place]      Radiological Exams on Admission: CT Head Wo Contrast  Result Date: 03/10/2021 CLINICAL DATA:  Right leg weakness EXAM: CT HEAD WITHOUT CONTRAST TECHNIQUE: Contiguous axial images were obtained from the base of the skull through the vertex without intravenous contrast.  COMPARISON:  01/27/2020 FINDINGS: Brain: Stable scattered white matter microvascular changes about the lateral ventricles and in the high anterior frontal lobes bilaterally. No acute intracranial hemorrhage, new mass lesion, new infarction, midline shift, herniation, hydrocephalus, or extra-axial  fluid collection. Vascular: Left MCA intravascular stent noted. No hyperdense abnormal vessel appreciated. Skull: Normal. Negative for fracture or focal lesion. Sinuses/Orbits: No acute finding. Other: None. IMPRESSION: Stable chronic white matter microvascular changes. No acute intracranial abnormality or interval change by noncontrast imaging. Electronically Signed   By: Judie Petit.  Shick M.D.   On: 03/10/2021 16:31   MR BRAIN WO CONTRAST  Result Date: 03/10/2021 CLINICAL DATA:  Right lower extremity weakness EXAM: MRI HEAD WITHOUT CONTRAST TECHNIQUE: Multiplanar, multiecho pulse sequences of the brain and surrounding structures were obtained without intravenous contrast. COMPARISON:  None. FINDINGS: Brain: No acute infarct, mass effect or extra-axial collection. Small chronic infarcts of the corona radiata bilaterally. Normal white matter signal, parenchymal volume and CSF spaces. The midline structures are normal. Vascular: Major flow voids are preserved. Skull and upper cervical spine: Normal calvarium and skull base. Visualized upper cervical spine and soft tissues are normal. Sinuses/Orbits:No paranasal sinus fluid levels or advanced mucosal thickening. No mastoid or middle ear effusion. Normal orbits. IMPRESSION: 1. No acute intracranial abnormality. 2. Small chronic infarcts of the corona radiata bilaterally. Electronically Signed   By: Deatra Robinson M.D.   On: 03/10/2021 23:43   CT ANGIO HEAD NECK W WO CM W PERF (CODE STROKE)  Result Date: 03/10/2021 CLINICAL DATA:  Right lower extremity weakness EXAM: CT ANGIOGRAPHY HEAD AND NECK CT PERFUSION BRAIN TECHNIQUE: Multidetector CT imaging of the head and neck was  performed using the standard protocol during bolus administration of intravenous contrast. Multiplanar CT image reconstructions and MIPs were obtained to evaluate the vascular anatomy. Carotid stenosis measurements (when applicable) are obtained utilizing NASCET criteria, using the distal internal carotid diameter as the denominator. Multiphase CT imaging of the brain was performed following IV bolus contrast injection. Subsequent parametric perfusion maps were calculated using RAPID software. CONTRAST:  OMNIPAQUE IOHEXOL 350 MG/ML SOLN COMPARISON:  08/08/2019 FINDINGS: CTA NECK FINDINGS SKELETON: There is no bony spinal canal stenosis. No lytic or blastic lesion. OTHER NECK: Normal pharynx, larynx and major salivary glands. No cervical lymphadenopathy. Unremarkable thyroid gland. UPPER CHEST: No pneumothorax or pleural effusion. No nodules or masses. AORTIC ARCH: There is no calcific atherosclerosis of the aortic arch. There is no aneurysm, dissection or hemodynamically significant stenosis of the visualized portion of the aorta. Conventional 3 vessel aortic branching pattern. The visualized proximal subclavian arteries are widely patent. RIGHT CAROTID SYSTEM: Normal without aneurysm, dissection or stenosis. LEFT CAROTID SYSTEM: Normal without aneurysm, dissection or stenosis. VERTEBRAL ARTERIES: Left dominant configuration. Both origins are clearly patent. There is no dissection, occlusion or flow-limiting stenosis to the skull base (V1-V3 segments). CTA HEAD FINDINGS POSTERIOR CIRCULATION: --Vertebral arteries: Normal V4 segments. --Inferior cerebellar arteries: Normal. --Basilar artery: Normal. --Superior cerebellar arteries: Normal. --Posterior cerebral arteries (PCA): Normal. ANTERIOR CIRCULATION: --Intracranial internal carotid arteries: Atherosclerotic calcification of the internal carotid arteries at the skull base without hemodynamically significant stenosis. --Anterior cerebral arteries (ACA):  Unchanged occlusion of both A1 segments. Worsened opacification of the A2 segments. --Middle cerebral arteries (MCA): Unchanged position of left MCA stent with preserved patency. Mild irregularity of the right MCA is unchanged. VENOUS SINUSES: As permitted by contrast timing, patent. ANATOMIC VARIANTS: None Review of the MIP images confirms the above findings. CT Brain Perfusion Findings: ASPECTS: 10 CBF (<30%) Volume: 34mL Perfusion (Tmax>6.0s) volume: 71mL. Of note, using of thrush old of 4 seconds, there is a 19 mL area of abnormality within the frontal lobes. Mismatch Volume: 13mL Infarction Location:None IMPRESSION: 1. No emergent large vessel occlusion  or high-grade stenosis of the intracranial arteries. 2. No acute ischemia by standard CT perfusion criteria. Using a lower threshold for Tmax shows areas of abnormality within the left-greater-than-right frontal lobes within the ACA territories. 3. Unchanged occlusion of both A1 segments with worsened opacification of the A2 segments. This vascular distribution could contribute to lower extremity symptoms. 4. Unchanged position of left MCA stent with preserved patency. Electronically Signed   By: Deatra Robinson M.D.   On: 03/10/2021 19:54    EKG: Independently reviewed. Sinus rhythm.   Assessment/Plan   1. Renal insufficiency  - SCr is 1.75 on admission, up from 1.10 a year ago  - She has history of CKD II, possibly that this is acute prerenal azotemia in setting of poor appetite recently d/t headaches  - She is receiving a 2nd liter of IVF in ED  - Hold chlorthalidone, check UA and urine albumin to creatinine ratio, renally-dose medications, repeat chem panel in am   2. Right leg weakness; hx of CVA - Patient with history of CVAs with residual right-sided motor deficit presents with headache and RLE weakness that she noticed on waking but not at ~23:00 the night before  - No acute findings on head CT  - Neurology recommended MRI brain which is  pending  - Follow-up MRI and consult neurology if there is new CVA, continue statin and Brilinta    ADDENDUM: No acute findings on MRI brain.    3. Chronic headache  - Reported severe headache on arrival similar to her usual headache  - No acute findings on head CT  - Resolved after treatment in ED with Reglan, Benadryl, and Depakote  - Continue topiramate    4. Type II DM  - No recent A1c  - Update A1c, use low-intensity SSI for now   5. Hypertension  - Hold chlorthalidone while hydrating, treat as-needed only for now     DVT prophylaxis: sq heparin  Code Status: Full   Level of Care: Level of care: Telemetry Medical Family Communication: None available  Disposition Plan:  Patient is from: Home  Anticipated d/c is to: Home  Anticipated d/c date is: 03/11/21 Patient currently: Pending MRI brain, repeat chem panel  Consults called: None  Admission status:  Observation    Briscoe Deutscher, MD [facility]  03/10/2021, 11:55 PM

## 2021-03-10 NOTE — ED Provider Notes (Signed)
Emergency Medicine Provider Triage Evaluation Note  Patricia [name] , a 55 y.o. female  was evaluated in triage.  Pt complains of right-sided leg weakness.  Patient with history of 3 strokes, on Brilinta.  States that she woke up in the middle the night and had some right leg weakness.  She has a history of stress incontinence.  She has had weakness since then.  Last known well 11 PM, however the patient is not sure what time she went to bed.  She has residual right-sided facial droop, which she states gets worse when she has headaches.  Denies any nausea or vomiting.  No vision changes.  She is a poor historian.  She does appear to be slightly aphasic, however cannot tell me if this is residual symptom of her prior strokes.  She denies any back pain, loss of bowel bladder control.  No fevers or chills.  Per chart review, patient followed by Dr. Everlena Cooper neurology.  Per his notes, patient does have a recurrent history of speech difficulty and dragging of her right foot.  She has had MRIs and EEGs which were negative.  Review of Systems  Positive: As above Negative: As above  Physical Exam  BP (!) 128/108 (BP Location: Right Arm)   Pulse 93   Temp 99.4 F (37.4 C) (Oral)   Resp 20   SpO2 100%  Gen:   Awake, no distress   Resp:  Normal effort  MSK:   Moves extremities without difficulty  Other:  Patient with right-sided facial droop, some evidence of aphasia and slurred speech.  Other cranial nerves intact.  5/5 strength in upper lower extremities bilaterally.  Sensations intact.  No midline tenderness to the C, T, L-spine  Medical Decision Making  Medically screening exam initiated at 3:35 PM.  Appropriate orders placed.  Patricia [name] was informed that the remainder of the evaluation will be completed by another provider, this initial triage assessment does not replace that evaluation, and the importance of remaining in the ED until their evaluation is complete.  Patient outside of  the stroke window, as well as with history of difficulty speaking and right-sided foot weakness/dragging.  Code stroke not initiated as she does not meet criteria   Patricia Ferrari, PA-C 03/10/21 1536    Patricia Plan, DO 03/10/21 1622

## 2021-03-10 NOTE — ED Provider Notes (Signed)
[facility] EMERGENCY DEPARTMENT Provider Note   CSN: [id number] Arrival date & time: 03/10/21  1504     History Chief Complaint  Patient presents with  . Stroke Symptoms    Patricia [name] is a 55 y.o. female.  HPI  Patient presents with right-sided leg and arm weakness, right-sided facial droop, and difficulty with speech.  Her last known normal was last night at about 10 PM according to the patient most conservatively.  She first noticed the right leg weakness whenever she tried to stand up and walk to go to the bathroom last night and had to drag her leg.  She has a history of 3 strokes, left MCA stent, as well as hemiplegic migraines.  She cannot remember at all the last time that she had right arm and leg weakness.  Endorses compliance with her Brilinta.  She has a 15 out of 10 headache that feels like a sharp pain in the back of her head and is consistent with previous migraines.     Past Medical History:  Diagnosis Date  . Anemia    in the past  . Asthma    " a long time ago"  . Bursitis   . Depression    13 years ago -   . Diabetes mellitus without complication (HCC)    type  2  . GERD (gastroesophageal reflux disease)    occ. acid reflux  . H/O: hysterectomy   . Headache    migraines  . Hypertension   . Kidney stones   . PFO (patent foramen ovale)    hx of PFO with surgery done as an adult  . Rotator cuff tear    right  . Stroke [facility]) 2009   lasdt stroke July 2015  . Syncope and collapse     Patient Active Problem List   Diagnosis Date Noted  . Renal insufficiency 03/10/2021  . Chronic daily headache 06/19/2017  . Intractable chronic migraine without aura and with status migrainosus 09/15/2016  . Depression 08/23/2016  . Cerebrovascular accident (CVA) due to occlusion of left middle cerebral artery (HCC) 07/28/2016  . Acute respiratory failure (HCC)   . Middle cerebral artery stenosis, left 07/24/2016  . Headache 03/23/2016  .  Type 2 diabetes mellitus with complication, with long-term current use of insulin (HCC) 09/08/2015  . Hyperglycemia   . TIA (transient ischemic attack)   . AKI (acute kidney injury) (HCC) 02/20/2015  . ARF (acute renal failure) (HCC) 02/20/2015  . Rotator cuff tear 02/04/2015  . Occipital neuralgia 08/27/2014  . Type 2 diabetes mellitus with complication (HCC) 08/27/2014  . PFO (patent foramen ovale) 06/17/2014  . Hyperlipidemia 06/17/2014  . Morbid obesity, unspecified obesity type (HCC) 06/17/2014  . Acute CVA (cerebrovascular accident) (HCC) 05/22/2014  . Non-compliance 05/22/2014  . History of cardioembolic stroke 05/22/2014  . Status post device closure of ASD 05/22/2014  . HTN (hypertension), benign 05/22/2014  . DM type 2 (diabetes mellitus, type 2) (HCC) 05/22/2014    Past Surgical History:  Procedure Laterality Date  . ABDOMINAL HYSTERECTOMY    . BREAST REDUCTION SURGERY    . CARDIAC CATHETERIZATION    . CARDIAC SURGERY  Closed hole in heart in 2009   Amplatzer Septal Occluder Ref [id number] (not listed in op notes, document scanned under MRI with written op notes  . HYSTEROTOMY    . IR ANGIO INTRA EXTRACRAN SEL COM CAROTID INNOMINATE BILAT MOD SED  04/09/2018  . IR ANGIO VERTEBRAL SEL  SUBCLAVIAN INNOMINATE UNI R MOD SED  04/09/2018  . IR GENERIC HISTORICAL  06/23/2016   IR ANGIO INTRA EXTRACRAN SEL COM CAROTID INNOMINATE BILAT MOD SED 06/23/2016 Julieanne Cotton, MD [facility]  . IR GENERIC HISTORICAL  06/23/2016   IR ANGIO VERTEBRAL SEL VERTEBRAL BILAT MOD SED 06/23/2016 Julieanne Cotton, MD [facility]  . IR GENERIC HISTORICAL  06/29/2016   IR RADIOLOGIST EVAL & MGMT 06/29/2016 [facility]  . IR GENERIC HISTORICAL  07/24/2016   IR INTRA CRAN STENT 07/24/2016 Julieanne Cotton, MD [facility]  . IR GENERIC HISTORICAL  07/28/2016   IR PTA INTRACRANIAL 07/28/2016 Julieanne Cotton, MD [facility]  . IR GENERIC HISTORICAL  07/28/2016   IR ENDOVASC INTRACRANIAL INF OTHER THAN  THROMBO ART INC DIAG ANGIO 07/28/2016 Julieanne Cotton, MD [facility]  . IR GENERIC HISTORICAL  08/07/2016   IR RADIOLOGIST EVAL & MGMT 08/07/2016 [facility]  . IR GENERIC HISTORICAL  12/05/2016   IR ANGIO INTRA EXTRACRAN SEL COM CAROTID INNOMINATE BILAT MOD SED 12/05/2016 Julieanne Cotton, MD [facility]  . IR GENERIC HISTORICAL  12/05/2016   IR ANGIO VERTEBRAL SEL SUBCLAVIAN INNOMINATE UNI R MOD SED 12/05/2016 Julieanne Cotton, MD [facility]  . IR GENERIC HISTORICAL  12/05/2016   IR ANGIO VERTEBRAL SEL VERTEBRAL UNI L MOD SED 12/05/2016 Julieanne Cotton, MD [facility]  . RADIOLOGY WITH ANESTHESIA [name] 07/24/2016   Procedure: Stenting;  Surgeon: Julieanne Cotton, MD;  Location: [facility] OR;  Service: Radiology;  Laterality: [name];  . RADIOLOGY WITH ANESTHESIA [name] 07/28/2016   Procedure: RADIOLOGY WITH ANESTHESIA;  Surgeon: Medication Radiologist, MD;  Location: [facility] OR;  Service: Radiology;  Laterality: [name];  . REDUCTION MAMMAPLASTY Bilateral   . SHOULDER ARTHROSCOPY WITH OPEN ROTATOR CUFF REPAIR AND DISTAL CLAVICLE ACROMINECTOMY Right 02/04/2015   Procedure: SHOULDER ARTHROSCOPY WITH SUBACROMIAL DECOMPRESSION AND OPEN ROTATOR CUFF REPAIR,;  Surgeon: Cammy Copa, MD;  Location: [facility] OR;  Service: Orthopedics;  Laterality: Right;  RIGHT SHOULDER DOA,SAD,MINI-OPEN ROTATOR CUFF TEAR REPAIR.     OB History   No obstetric history on file.     Family History  Problem Relation Age of Onset  . Hypertension Mother   . Hypertension Father   . Cancer Maternal Aunt   . Breast cancer Maternal Aunt   . Cancer Maternal Uncle     Social History   Tobacco Use  . Smoking status: Never Smoker  . Smokeless tobacco: Never Used  Vaping Use  . Vaping Use: Never used  Substance Use Topics  . Alcohol use: Yes    Alcohol/week: 0.0 standard drinks    Comment: occasional  . Drug use: No    Home Medications Prior to Admission medications   Medication Sig Start Date End Date Taking? Authorizing  Provider  BRILINTA 90 MG TABS tablet Take 90 mg by mouth 2 (two) times daily.  01/15/18  Yes [provider]  cetirizine (ZYRTEC) 10 MG tablet Take 10 mg by mouth at bedtime.   Yes [provider]  chlorthalidone (HYGROTON) 25 MG tablet Take 25 mg by mouth at bedtime.  09/03/15  Yes [provider]  Cholecalciferol (VITAMIN D-3) 25 MCG (1000 UT) CAPS Take 1,000 Units by mouth at bedtime.   Yes [provider]  HYDROcodone-acetaminophen (NORCO/VICODIN) 5-325 MG tablet Take 1 tablet by mouth every 4 (four) hours as needed. Patient taking differently: Take 1 tablet by mouth every 4 (four) hours as needed (for pain). 11/28/19  Yes Jacalyn Lefevre, MD  JARDIANCE 25 MG TABS  tablet Take 25 mg by mouth at bedtime.   Yes [provider]  Lasmiditan Succinate (REYVOW) 100 MG TABS TAKE 1 TABLET BY MOUTH DAILY AS NEEDED. MAXIMUM 1 TABLET IN 24 HOURS Patient taking differently: Take 100 mg by mouth daily as needed (for migraines- MAX OF #1 TABLET/24 HOURS). 12/07/20  Yes Jaffe, Adam R, DO  pravastatin (PRAVACHOL) 80 MG tablet Take 80 mg by mouth at bedtime.    Yes [provider]  promethazine (PHENERGAN) 25 MG tablet Take 1 tablet (25 mg total) by mouth every 4 (four) hours as needed for nausea or vomiting. 10/14/20  Yes Jaffe, Adam R, DO  tiZANidine (ZANAFLEX) 4 MG tablet Take 4 mg by mouth every 6 (six) hours as needed for muscle spasms.   Yes [provider]  topiramate ER (QUDEXY XR) 100 MG CS24 sprinkle capsule Take 1 capsule (100 mg total) by mouth daily. Patient taking differently: Take 100 mg by mouth at bedtime. 02/17/21  Yes Jaffe, Adam R, DO    Allergies    Imitrex [sumatriptan], Triptans, Plavix [clopidogrel bisulfate], Zofran [ondansetron], Cymbalta [duloxetine hcl], Lipitor [atorvastatin], and Topamax [topiramate]  Review of Systems   Review of Systems  Constitutional: Negative for chills and fever.  HENT: Negative for ear pain and  sore throat.   Eyes: Negative for pain and visual disturbance.  Respiratory: Negative for cough and shortness of breath.   Cardiovascular: Negative for chest pain and palpitations.  Gastrointestinal: Negative for abdominal pain and vomiting.  Genitourinary: Negative for dysuria and hematuria.  Musculoskeletal: Negative for arthralgias and back pain.  Skin: Negative for color change and rash.  Neurological: Positive for weakness and headaches.  All other systems reviewed and are negative.   Physical Exam Updated Vital Signs BP 117/81   Pulse 82   Temp 98.2 F (36.8 C) (Oral)   Resp 16   Ht 5\' 5"  (1.651 m)   Wt 86 kg   SpO2 98%   BMI 31.55 kg/m   Physical Exam Vitals and nursing note reviewed.  Constitutional:      General: She is not in acute distress.    Appearance: She is well-developed.  HENT:     Head: Normocephalic and atraumatic.  Eyes:     Conjunctiva/sclera: Conjunctivae normal.  Cardiovascular:     Rate and Rhythm: Normal rate and regular rhythm.     Heart sounds: No murmur heard.   Pulmonary:     Effort: Pulmonary effort is normal. No respiratory distress.     Breath sounds: Normal breath sounds.  Abdominal:     Palpations: Abdomen is soft.     Tenderness: There is no abdominal tenderness.  Musculoskeletal:     Cervical back: Neck supple.     Right lower leg: No edema.     Left lower leg: No edema.  Skin:    General: Skin is warm and dry.  Neurological:     Mental Status: She is alert.     Comments: Right-sided facial droop is present.  Dysarthria is present.  Does not appear to be expressively or receptively aphasic.  Right arm and legs exhibit drift and are antigravity but not full strength when compared to the left.  No sensory deficit.  No nystagmus.  Psychiatric:        Behavior: Behavior normal.        Thought Content: Thought content normal.     ED Results / Procedures / Treatments   Labs (all labs ordered are listed, but  only abnormal  results are displayed) Labs Reviewed  BASIC METABOLIC PANEL - Abnormal; Notable for the following components:      Result Value   Glucose, Bld 134 (*)    BUN 30 (*)    Creatinine, Ser 1.75 (*)    GFR, Estimated 34 (*)    All other components within normal limits  BASIC METABOLIC PANEL - Abnormal; Notable for the following components:   BUN 22 (*)    Creatinine, Ser 1.69 (*)    GFR, Estimated 36 (*)    All other components within normal limits  CBC - Abnormal; Notable for the following components:   Hemoglobin 11.5 (*)    HCT 35.3 (*)    All other components within normal limits  HEMOGLOBIN A1C - Abnormal; Notable for the following components:   Hgb A1c MFr Bld 8.5 (*)    All other components within normal limits  GLUCOSE, CAPILLARY - Abnormal; Notable for the following components:   Glucose-Capillary 106 (*)    All other components within normal limits  RESP PANEL BY RT-PCR (FLU A&B, COVID) ARPGX2  CBC  HIV ANTIBODY (ROUTINE TESTING W REFLEX)  GLUCOSE, CAPILLARY  GLUCOSE, CAPILLARY  BASIC METABOLIC PANEL    EKG EKG Interpretation  Date/Time:  Thursday Mar 10 2021 15:07:11 EDT Ventricular Rate:  90 PR Interval:  152 QRS Duration: 82 QT Interval:  382 QTC Calculation: 467 R Axis:   -4 Text Interpretation: Normal sinus rhythm Normal ECG when compared to prior, similar appearance. No STEMI Confirmed by Theda Belfast ([id number]) on 03/10/2021 5:37:01 PM   Radiology CT Head Wo Contrast  Result Date: 03/10/2021 CLINICAL DATA:  Right leg weakness EXAM: CT HEAD WITHOUT CONTRAST TECHNIQUE: Contiguous axial images were obtained from the base of the skull through the vertex without intravenous contrast. COMPARISON:  01/27/2020 FINDINGS: Brain: Stable scattered white matter microvascular changes about the lateral ventricles and in the high anterior frontal lobes bilaterally. No acute intracranial hemorrhage, new mass lesion, new infarction, midline shift, herniation, hydrocephalus, or  extra-axial fluid collection. Vascular: Left MCA intravascular stent noted. No hyperdense abnormal vessel appreciated. Skull: Normal. Negative for fracture or focal lesion. Sinuses/Orbits: No acute finding. Other: None. IMPRESSION: Stable chronic white matter microvascular changes. No acute intracranial abnormality or interval change by noncontrast imaging. Electronically Signed   By: Judie Petit.  Shick M.D.   On: 03/10/2021 16:31   MR BRAIN WO CONTRAST  Result Date: 03/10/2021 CLINICAL DATA:  Right lower extremity weakness EXAM: MRI HEAD WITHOUT CONTRAST TECHNIQUE: Multiplanar, multiecho pulse sequences of the brain and surrounding structures were obtained without intravenous contrast. COMPARISON:  None. FINDINGS: Brain: No acute infarct, mass effect or extra-axial collection. Small chronic infarcts of the corona radiata bilaterally. Normal white matter signal, parenchymal volume and CSF spaces. The midline structures are normal. Vascular: Major flow voids are preserved. Skull and upper cervical spine: Normal calvarium and skull base. Visualized upper cervical spine and soft tissues are normal. Sinuses/Orbits:No paranasal sinus fluid levels or advanced mucosal thickening. No mastoid or middle ear effusion. Normal orbits. IMPRESSION: 1. No acute intracranial abnormality. 2. Small chronic infarcts of the corona radiata bilaterally. Electronically Signed   By: Deatra Robinson M.D.   On: 03/10/2021 23:43   CT ANGIO HEAD NECK W WO CM W PERF (CODE STROKE)  Result Date: 03/10/2021 CLINICAL DATA:  Right lower extremity weakness EXAM: CT ANGIOGRAPHY HEAD AND NECK CT PERFUSION BRAIN TECHNIQUE: Multidetector CT imaging of the head and neck was performed using the standard protocol during  bolus administration of intravenous contrast. Multiplanar CT image reconstructions and MIPs were obtained to evaluate the vascular anatomy. Carotid stenosis measurements (when applicable) are obtained utilizing NASCET criteria, using the distal  internal carotid diameter as the denominator. Multiphase CT imaging of the brain was performed following IV bolus contrast injection. Subsequent parametric perfusion maps were calculated using RAPID software. CONTRAST:  OMNIPAQUE IOHEXOL 350 MG/ML SOLN COMPARISON:  08/08/2019 FINDINGS: CTA NECK FINDINGS SKELETON: There is no bony spinal canal stenosis. No lytic or blastic lesion. OTHER NECK: Normal pharynx, larynx and major salivary glands. No cervical lymphadenopathy. Unremarkable thyroid gland. UPPER CHEST: No pneumothorax or pleural effusion. No nodules or masses. AORTIC ARCH: There is no calcific atherosclerosis of the aortic arch. There is no aneurysm, dissection or hemodynamically significant stenosis of the visualized portion of the aorta. Conventional 3 vessel aortic branching pattern. The visualized proximal subclavian arteries are widely patent. RIGHT CAROTID SYSTEM: Normal without aneurysm, dissection or stenosis. LEFT CAROTID SYSTEM: Normal without aneurysm, dissection or stenosis. VERTEBRAL ARTERIES: Left dominant configuration. Both origins are clearly patent. There is no dissection, occlusion or flow-limiting stenosis to the skull base (V1-V3 segments). CTA HEAD FINDINGS POSTERIOR CIRCULATION: --Vertebral arteries: Normal V4 segments. --Inferior cerebellar arteries: Normal. --Basilar artery: Normal. --Superior cerebellar arteries: Normal. --Posterior cerebral arteries (PCA): Normal. ANTERIOR CIRCULATION: --Intracranial internal carotid arteries: Atherosclerotic calcification of the internal carotid arteries at the skull base without hemodynamically significant stenosis. --Anterior cerebral arteries (ACA): Unchanged occlusion of both A1 segments. Worsened opacification of the A2 segments. --Middle cerebral arteries (MCA): Unchanged position of left MCA stent with preserved patency. Mild irregularity of the right MCA is unchanged. VENOUS SINUSES: As permitted by contrast timing, patent. ANATOMIC  VARIANTS: None Review of the MIP images confirms the above findings. CT Brain Perfusion Findings: ASPECTS: 10 CBF (<30%) Volume: 23mL Perfusion (Tmax>6.0s) volume: 25mL. Of note, using of thrush old of 4 seconds, there is a 19 mL area of abnormality within the frontal lobes. Mismatch Volume: 91mL Infarction Location:None IMPRESSION: 1. No emergent large vessel occlusion or high-grade stenosis of the intracranial arteries. 2. No acute ischemia by standard CT perfusion criteria. Using a lower threshold for Tmax shows areas of abnormality within the left-greater-than-right frontal lobes within the ACA territories. 3. Unchanged occlusion of both A1 segments with worsened opacification of the A2 segments. This vascular distribution could contribute to lower extremity symptoms. 4. Unchanged position of left MCA stent with preserved patency. Electronically Signed   By: Deatra Robinson M.D.   On: 03/10/2021 19:54    Procedures Procedures   Medications Ordered in ED Medications  pravastatin (PRAVACHOL) tablet 80 mg (80 mg Oral Given 03/11/21 0137)  ticagrelor (BRILINTA) tablet 90 mg (90 mg Oral Given 03/11/21 0956)  tiZANidine (ZANAFLEX) tablet 4 mg (has no administration in time range)  topiramate ER (QUDEXY XR) capsule 100 mg (100 mg Oral Given 03/11/21 0137)  Lasmiditan Succinate TABS 100 mg (has no administration in time range)  heparin injection 5,000 Units (5,000 Units Subcutaneous Not Given 03/11/21 0529)  0.9 %  sodium chloride infusion (has no administration in time range)  acetaminophen (TYLENOL) tablet 650 mg (has no administration in time range)    Or  acetaminophen (TYLENOL) suppository 650 mg (has no administration in time range)  HYDROcodone-acetaminophen (NORCO/VICODIN) 5-325 MG per tablet 1-2 tablet (2 tablets Oral Given 03/11/21 1038)  senna-docusate (Senokot-S) tablet 1 tablet (has no administration in time range)  ondansetron (ZOFRAN) tablet 4 mg (has no administration in time range)  Or   ondansetron [facility]) injection 4 mg (has no administration in time range)  insulin aspart (novoLOG) injection 0-6 Units (0 Units Subcutaneous Not Given 03/11/21 1200)  insulin aspart (novoLOG) injection 0-5 Units (0 Units Subcutaneous Not Given 03/11/21 0414)  hydrALAZINE (APRESOLINE) tablet 25 mg (has no administration in time range)  metoCLOPramide (REGLAN) injection 10 mg (10 mg Intravenous Given 03/10/21 1812)  diphenhydrAMINE (BENADRYL) injection 50 mg (50 mg Intravenous Given 03/10/21 1812)  lactated ringers bolus 1,000 mL (0 mLs Intravenous Stopped 03/10/21 1916)  iohexol (OMNIPAQUE) 350 MG/ML injection 100 mL (100 mLs Intravenous Contrast Given 03/10/21 1930)  valproate (DEPACON) 1,000 mg in dextrose 5 % 50 mL IVPB (0 mg Intravenous Stopped 03/11/21 0130)  lactated ringers bolus 1,000 mL (0 mLs Intravenous Stopped 03/11/21 0236)  LORazepam (ATIVAN) injection 2 mg (2 mg Intravenous Given 03/10/21 2244)    ED Course  I have reviewed the triage vital signs and the nursing notes.  Pertinent labs & imaging results that were available during my care of the patient were reviewed by me and considered in my medical decision making (see chart for details).    MDM Rules/Calculators/A&P                           Patient presents with right-sided weakness.  Concern for CVA versus hemiplegic migraine.  Given her history, including left MCA stent, stent occlusion is also possible and her history and physical is highly concerning for CVA.  Ordered a code stroke CT scan and discussed with neurology who agreed to follow-up the scan and evaluate.  Headache medicine also ordered in consideration of hemiplegic migraine.  CT Noncon ordered from triage does not show any hemorrhage  CTA without evidence of acute LVO. MRI recommended by neurology On repeat eval, pt sx improved from 15/10 HA to 7/10 HA.  Labs are notable for acute kidney injury.  Unclear of the etiology; given the elevated BUN, prerenal etiology  is possible.  IV fluids have already been ordered. Given her acute kidney injury, plan is for admission.  Given the improvement in headache, medicine admission is reasonable.  Neurology to follow-up MRI.  Admitted to the hospitalist in stable condition  Final Clinical Impression(s) / ED Diagnoses Final diagnoses:  None    Rx / DC Orders ED Discharge Orders         Ordered    Increase activity slowly        03/11/21 1155    Diet - low sodium heart healthy        03/11/21 1155    Call MD for:  persistant nausea and vomiting        03/11/21 1155    Call MD for:  difficulty breathing, headache or visual disturbances        03/11/21 1155           Jacklynn Bue, MD 03/11/21 1832    Tegeler, Canary Brim, MD 03/12/21 1118

## 2021-03-11 ENCOUNTER — Other Ambulatory Visit: Payer: Self-pay

## 2021-03-11 ENCOUNTER — Encounter (HOSPITAL_COMMUNITY): Payer: Self-pay | Admitting: Family Medicine

## 2021-03-11 DIAGNOSIS — N289 Disorder of kidney and ureter, unspecified: Secondary | ICD-10-CM | POA: Diagnosis not present

## 2021-03-11 LAB — BASIC METABOLIC PANEL
Anion gap: 6 (ref 5–15)
BUN: 22 mg/dL — ABNORMAL HIGH (ref 6–20)
CO2: 24 mmol/L (ref 22–32)
Calcium: 9.1 mg/dL (ref 8.9–10.3)
Chloride: 107 mmol/L (ref 98–111)
Creatinine, Ser: 1.69 mg/dL — ABNORMAL HIGH (ref 0.44–1.00)
GFR, Estimated: 36 mL/min — ABNORMAL LOW (ref >60)
Glucose, Bld: 98 mg/dL (ref 70–99)
Potassium: 3.5 mmol/L (ref 3.5–5.1)
Sodium: 137 mmol/L (ref 135–145)

## 2021-03-11 LAB — HIV ANTIBODY (ROUTINE TESTING W REFLEX): HIV Screen 4th Generation wRfx: NONREACTIVE

## 2021-03-11 LAB — HEMOGLOBIN A1C
Hgb A1c MFr Bld: 8.5 % — ABNORMAL HIGH (ref 4.8–5.6)
Mean Plasma Glucose: 197.25 mg/dL

## 2021-03-11 LAB — CBC
HCT: 35.3 % — ABNORMAL LOW (ref 36.0–46.0)
Hemoglobin: 11.5 g/dL — ABNORMAL LOW (ref 12.0–15.0)
MCH: 28.7 pg (ref 26.0–34.0)
MCHC: 32.6 g/dL (ref 30.0–36.0)
MCV: 88 fL (ref 80.0–100.0)
Platelets: 200 10*3/uL (ref 150–400)
RBC: 4.01 MIL/uL (ref 3.87–5.11)
RDW: 12.7 % (ref 11.5–15.5)
WBC: 6.5 10*3/uL (ref 4.0–10.5)
nRBC: 0 % (ref 0.0–0.2)

## 2021-03-11 LAB — GLUCOSE, CAPILLARY
Glucose-Capillary: 91 mg/dL (ref 70–99)
Glucose-Capillary: 106 mg/dL — ABNORMAL HIGH (ref 70–99)
Glucose-Capillary: 95 mg/dL (ref 70–99)

## 2021-03-11 MED ORDER — HYDRALAZINE HCL 25 MG PO TABS: 25.0000 mg | ORAL_TABLET | Freq: Four times a day (QID) | ORAL | Status: DC | PRN

## 2021-03-11 NOTE — Discharge Summary (Signed)
Physician Discharge Summary  Patricia [name] MPN:[id number] DOB: 02-19-1966 DOA: 03/10/2021  PCP: Salli Real, MD  Admit date: 03/10/2021 Discharge date: 03/11/2021  Admitted From: Home  disposition: Home   Recommendations for Outpatient Follow-up:  1. Follow up with PCP in 1-2 weeks 2. Please obtain BMP/CBC in one week 3. Please follow up with neurologist Dr. Everlena Cooper  Home Health: None Equipment/Devices: None Discharge Condition: Stable CODE STATUS full code Diet recommendation: Cardiac Brief/Interim Summary:Patricia [name] is a 55 y.o. female with medical history significant for hypertension, type 2 diabetes mellitus, history of CVA with residual deficits, and chronic migraine, now presenting to the emergency department with headache and right lower extremity weakness.  Patient reports that she was in her usual state at approximately 11 PM last night but noticed her right leg was weak when she woke up at approximately 2 AM.  Right leg weakness has persisted throughout the day and the patient also has a severe posterior headache similar to her chronic headaches.  She denies any chest pain, palpitations, numbness, acute low back pain, injury, fevers, or chills.  ED Course: Upon arrival to the ED, patient is found to be afebrile, saturating well on room air, and with stable blood pressure.  EKG features a sinus rhythm.  Noncontrast head CT negative for acute intracranial abnormality.  ED physician discussed the case with neurology who recommended MRI brain.  Patient was treated with IV Depakote, Reglan, Benadryl, 2 L of IV fluids, and 2 mg IV Ativan in the emergency department.  Hospitalist were asked to admit for renal insufficiency, possibly acute. Discharge Diagnoses:  Principal Problem:   Renal insufficiency Active Problems:   HTN (hypertension), benign   Type 2 diabetes mellitus with complication (HCC)   Cerebrovascular accident (CVA) due to occlusion of left middle cerebral artery  (HCC)   Intractable chronic migraine without aura and with status migrainosus    #1 AKI on CKD stage II patient with mild dehydration due to decreased p.o. intake and headaches and medication such as metformin and chlorthalidone.  She was hydrated creatinine came down to 1.69.  Prior to discharge she ambulated in the hallway 300 feet with the staff.  #2 type 2 diabetes metformin was stopped on discharge due to elevated creatinine.  #3 history of stroke with residual right lower extremity weakness.  CT head was negative for any acute findings.  ED physician discussed with neurology asked to get MRI if positive to consult them.  However MRI of the brain did not reveal any acute findings.  #4 chronic headaches patient follows with Dr. Everlena Cooper for this encouraged her to follow-up with him as early as possible.  She was treated with Depakote Reglan and Benadryl.  Continue topiramate.  #5 history of essential hypertension continue chlorthalidone  Estimated body mass index is 31.55 kg/m as calculated from the following:   Height as of this encounter: 5\' 5"  (1.651 m).   Weight as of this encounter: 86 kg.  Discharge Instructions  Discharge Instructions    Call MD for:  difficulty breathing, headache or visual disturbances   Complete by: As directed    Call MD for:  persistant nausea and vomiting   Complete by: As directed    Diet - low sodium heart healthy   Complete by: As directed    Increase activity slowly   Complete by: As directed      Allergies as of 03/11/2021      Reactions   Imitrex [sumatriptan] Anaphylaxis  Triptans Anaphylaxis, Hives   Plavix [clopidogrel Bisulfate] Hives   Zofran [ondansetron] Nausea Only   Cymbalta [duloxetine Hcl] Nausea Only   Lipitor [atorvastatin] Rash   Topamax [topiramate] Other (See Comments)   Memory issues      Medication List    STOP taking these medications   metFORMIN 1000 MG tablet Commonly known as: GLUCOPHAGE   ondansetron 4 MG  disintegrating tablet Commonly known as: Zofran ODT     TAKE these medications   Brilinta 90 MG Tabs tablet Generic drug: ticagrelor Take 90 mg by mouth 2 (two) times daily.   cetirizine 10 MG tablet Commonly known as: ZYRTEC Take 10 mg by mouth at bedtime.   chlorthalidone 25 MG tablet Commonly known as: HYGROTON Take 25 mg by mouth at bedtime.   HYDROcodone-acetaminophen 5-325 MG tablet Commonly known as: NORCO/VICODIN Take 1 tablet by mouth every 4 (four) hours as needed. What changed: reasons to take this   Jardiance 25 MG Tabs tablet Generic drug: empagliflozin Take 25 mg by mouth at bedtime. What changed: Another medication with the same name was removed. Continue taking this medication, and follow the directions you see here.   pravastatin 80 MG tablet Commonly known as: PRAVACHOL Take 80 mg by mouth at bedtime.   promethazine 25 MG tablet Commonly known as: PHENERGAN Take 1 tablet (25 mg total) by mouth every 4 (four) hours as needed for nausea or vomiting.   Reyvow 100 MG Tabs Generic drug: Lasmiditan Succinate TAKE 1 TABLET BY MOUTH DAILY AS NEEDED. MAXIMUM 1 TABLET IN 24 HOURS What changed:   how much to take  how to take this  when to take this  reasons to take this  additional instructions  Another medication with the same name was removed. Continue taking this medication, and follow the directions you see here.   tiZANidine 4 MG tablet Commonly known as: ZANAFLEX Take 4 mg by mouth every 6 (six) hours as needed for muscle spasms.   topiramate ER 100 MG Cs24 sprinkle capsule Commonly known as: Qudexy XR Take 1 capsule (100 mg total) by mouth daily. What changed: when to take this   Vitamin D-3 25 MCG (1000 UT) Caps Take 1,000 Units by mouth at bedtime.       Follow-up Information    Salli RealSun, Yun, MD Follow up.   Specialty: Internal Medicine Contact information: [address] [place] [address] [phone]        Drema DallasJaffe,  Adam R, DO Follow up.   Specialty: Neurology Contact information: [address] [place] [id number] [phone]              Allergies  Allergen Reactions  . Imitrex [Sumatriptan] Anaphylaxis  . Triptans Anaphylaxis and Hives  . Plavix [Clopidogrel Bisulfate] Hives  . Zofran [Ondansetron] Nausea Only  . Cymbalta [Duloxetine Hcl] Nausea Only  . Lipitor [Atorvastatin] Rash  . Topamax [Topiramate] Other (See Comments)    Memory issues    Consultations: ED physician discussed with neurology   Procedures/Studies: CT Head Wo Contrast  Result Date: 03/10/2021 CLINICAL DATA:  Right leg weakness EXAM: CT HEAD WITHOUT CONTRAST TECHNIQUE: Contiguous axial images were obtained from the base of the skull through the vertex without intravenous contrast. COMPARISON:  01/27/2020 FINDINGS: Brain: Stable scattered white matter microvascular changes about the lateral ventricles and in the high anterior frontal lobes bilaterally. No acute intracranial hemorrhage, new mass lesion, new infarction, midline shift, herniation, hydrocephalus, or extra-axial fluid collection. Vascular: Left  MCA intravascular stent noted. No hyperdense abnormal vessel appreciated. Skull: Normal. Negative for fracture or focal lesion. Sinuses/Orbits: No acute finding. Other: None. IMPRESSION: Stable chronic white matter microvascular changes. No acute intracranial abnormality or interval change by noncontrast imaging. Electronically Signed   By: Judie Petit.  Shick M.D.   On: 03/10/2021 16:31   MR BRAIN WO CONTRAST  Result Date: 03/10/2021 CLINICAL DATA:  Right lower extremity weakness EXAM: MRI HEAD WITHOUT CONTRAST TECHNIQUE: Multiplanar, multiecho pulse sequences of the brain and surrounding structures were obtained without intravenous contrast. COMPARISON:  None. FINDINGS: Brain: No acute infarct, mass effect or extra-axial collection. Small chronic infarcts of the corona radiata bilaterally. Normal white  matter signal, parenchymal volume and CSF spaces. The midline structures are normal. Vascular: Major flow voids are preserved. Skull and upper cervical spine: Normal calvarium and skull base. Visualized upper cervical spine and soft tissues are normal. Sinuses/Orbits:No paranasal sinus fluid levels or advanced mucosal thickening. No mastoid or middle ear effusion. Normal orbits. IMPRESSION: 1. No acute intracranial abnormality. 2. Small chronic infarcts of the corona radiata bilaterally. Electronically Signed   By: Deatra Robinson M.D.   On: 03/10/2021 23:43   CT ANGIO HEAD NECK W WO CM W PERF (CODE STROKE)  Result Date: 03/10/2021 CLINICAL DATA:  Right lower extremity weakness EXAM: CT ANGIOGRAPHY HEAD AND NECK CT PERFUSION BRAIN TECHNIQUE: Multidetector CT imaging of the head and neck was performed using the standard protocol during bolus administration of intravenous contrast. Multiplanar CT image reconstructions and MIPs were obtained to evaluate the vascular anatomy. Carotid stenosis measurements (when applicable) are obtained utilizing NASCET criteria, using the distal internal carotid diameter as the denominator. Multiphase CT imaging of the brain was performed following IV bolus contrast injection. Subsequent parametric perfusion maps were calculated using RAPID software. CONTRAST:  OMNIPAQUE IOHEXOL 350 MG/ML SOLN COMPARISON:  08/08/2019 FINDINGS: CTA NECK FINDINGS SKELETON: There is no bony spinal canal stenosis. No lytic or blastic lesion. OTHER NECK: Normal pharynx, larynx and major salivary glands. No cervical lymphadenopathy. Unremarkable thyroid gland. UPPER CHEST: No pneumothorax or pleural effusion. No nodules or masses. AORTIC ARCH: There is no calcific atherosclerosis of the aortic arch. There is no aneurysm, dissection or hemodynamically significant stenosis of the visualized portion of the aorta. Conventional 3 vessel aortic branching pattern. The visualized proximal subclavian arteries  are widely patent. RIGHT CAROTID SYSTEM: Normal without aneurysm, dissection or stenosis. LEFT CAROTID SYSTEM: Normal without aneurysm, dissection or stenosis. VERTEBRAL ARTERIES: Left dominant configuration. Both origins are clearly patent. There is no dissection, occlusion or flow-limiting stenosis to the skull base (V1-V3 segments). CTA HEAD FINDINGS POSTERIOR CIRCULATION: --Vertebral arteries: Normal V4 segments. --Inferior cerebellar arteries: Normal. --Basilar artery: Normal. --Superior cerebellar arteries: Normal. --Posterior cerebral arteries (PCA): Normal. ANTERIOR CIRCULATION: --Intracranial internal carotid arteries: Atherosclerotic calcification of the internal carotid arteries at the skull base without hemodynamically significant stenosis. --Anterior cerebral arteries (ACA): Unchanged occlusion of both A1 segments. Worsened opacification of the A2 segments. --Middle cerebral arteries (MCA): Unchanged position of left MCA stent with preserved patency. Mild irregularity of the right MCA is unchanged. VENOUS SINUSES: As permitted by contrast timing, patent. ANATOMIC VARIANTS: None Review of the MIP images confirms the above findings. CT Brain Perfusion Findings: ASPECTS: 10 CBF (<30%) Volume: 0mL Perfusion (Tmax>6.0s) volume: 0mL. Of note, using of thrush old of 4 seconds, there is a 19 mL area of abnormality within the frontal lobes. Mismatch Volume: 0mL Infarction Location:None IMPRESSION: 1. No emergent large vessel occlusion or high-grade stenosis of  the intracranial arteries. 2. No acute ischemia by standard CT perfusion criteria. Using a lower threshold for Tmax shows areas of abnormality within the left-greater-than-right frontal lobes within the ACA territories. 3. Unchanged occlusion of both A1 segments with worsened opacification of the A2 segments. This vascular distribution could contribute to lower extremity symptoms. 4. Unchanged position of left MCA stent with preserved patency.  Electronically Signed   By: Deatra Robinson M.D.   On: 03/10/2021 19:54    (Echo, Carotid, EGD, Colonoscopy, ERCP)    Subjective: Patient resting in bed no new complaints he still has some headache but able to walk 300 feet with the staff  Discharge Exam: Vitals:   03/11/21 0722 03/11/21 1150  BP: 118/79 117/81  Pulse: 83 82  Resp: 18 16  Temp: 98.5 F (36.9 C) 98.2 F (36.8 C)  SpO2: 98% 98%   Vitals:   03/11/21 0342 03/11/21 0344 03/11/21 0722 03/11/21 1150  BP:  (!) 128/95 118/79 117/81  Pulse:  88 83 82  Resp:  17 18 16   Temp:  98.4 F (36.9 C) 98.5 F (36.9 C) 98.2 F (36.8 C)  TempSrc:  Oral Oral Oral  SpO2:  99% 98% 98%  Weight: 86 kg     Height: 5\' 5"  (1.651 m)       General: Pt is alert, awake, not in acute distress Cardiovascular: RRR, S1/S2 +, no rubs, no gallops Respiratory: CTA bilaterally, no wheezing, no rhonchi Abdominal: Soft, NT, ND, bowel sounds + Extremities: no edema, no cyanosis    The results of significant diagnostics from this hospitalization (including imaging, microbiology, ancillary and laboratory) are listed below for reference.     Microbiology: Recent Results (from the past 240 hour(s))  Resp Panel by RT-PCR (Flu A&B, Covid) Nasopharyngeal Swab     Status: None   Collection Time: 03/10/21  8:49 PM   Specimen: Nasopharyngeal Swab; Nasopharyngeal(NP) swabs in vial transport medium  Result Value Ref Range Status   SARS Coronavirus 2 by RT PCR NEGATIVE NEGATIVE Final    Comment: (NOTE) SARS-CoV-2 target nucleic acids are NOT DETECTED.  The SARS-CoV-2 RNA is generally detectable in upper respiratory specimens during the acute phase of infection. The lowest concentration of SARS-CoV-2 viral copies this assay can detect is 138 copies/mL. A negative result does not preclude SARS-Cov-2 infection and should not be used as the sole basis for treatment or other patient management decisions. A negative result may occur with  improper  specimen collection/handling, submission of specimen other than nasopharyngeal swab, presence of viral mutation(s) within the areas targeted by this assay, and inadequate number of viral copies(<138 copies/mL). A negative result must be combined with clinical observations, patient history, and epidemiological information. The expected result is Negative.  Fact Sheet for Patients:   Fact Sheet for Healthcare Providers:  03/12/21  This test is no t yet approved or cleared by the [employer] FDA and  has been authorized for detection and/or diagnosis of SARS-CoV-2 by FDA under an Emergency Use Authorization (EUA). This EUA will remain  in effect (meaning this test can be used) for the duration of the COVID-19 declaration under Section 564(b)(1) of the Act, 21 U.S.C.section 360bbb-3(b)(1), unless the authorization is terminated  or revoked sooner.       Influenza A by PCR NEGATIVE NEGATIVE Final   Influenza B by PCR NEGATIVE NEGATIVE Final    Comment: (NOTE) The Xpert Xpress SARS-CoV-2/FLU/RSV plus assay is intended as an aid in the diagnosis of influenza  from Nasopharyngeal swab specimens and should not be used as a sole basis for treatment. Nasal washings and aspirates are unacceptable for Xpert Xpress SARS-CoV-2/FLU/RSV testing.  Fact Sheet for Patients: BloggerCourse.com  Fact Sheet for Healthcare Providers: SeriousBroker.it  This test is not yet approved or cleared by the [place] FDA and has been authorized for detection and/or diagnosis of SARS-CoV-2 by FDA under an Emergency Use Authorization (EUA). This EUA will remain in effect (meaning this test can be used) for the duration of the COVID-19 declaration under Section 564(b)(1) of the Act, 21 U.S.C. section 360bbb-3(b)(1), unless the authorization is terminated or revoked.  Performed at  [facility], [address] [place], [place] [address]      Labs: BNP (last 3 results) No results for input(s): BNP in the last 8760 hours. Basic Metabolic Panel: Recent Labs  Lab 03/10/21 1544 03/11/21 0533  NA 138 137  K 3.9 3.5  CL 104 107  CO2 24 24  GLUCOSE 134* 98  BUN 30* 22*  CREATININE 1.75* 1.69*  CALCIUM 9.4 9.1   Liver Function Tests: No results for input(s): AST, ALT, ALKPHOS, BILITOT, PROT, ALBUMIN in the last 168 hours. No results for input(s): LIPASE, AMYLASE in the last 168 hours. No results for input(s): AMMONIA in the last 168 hours. CBC: Recent Labs  Lab 03/10/21 1544 03/11/21 0533  WBC 8.7 6.5  HGB 12.7 11.5*  HCT 39.8 35.3*  MCV 89.6 88.0  PLT 227 200   Cardiac Enzymes: No results for input(s): CKTOTAL, CKMB, CKMBINDEX, TROPONINI in the last 168 hours. BNP: Invalid input(s): POCBNP CBG: Recent Labs  Lab 03/11/21 0352 03/11/21 0646 03/11/21 1148  GLUCAP 91 106* 95   D-Dimer No results for input(s): DDIMER in the last 72 hours. Hgb A1c Recent Labs    03/11/21 0533  HGBA1C 8.5*   Lipid Profile No results for input(s): CHOL, HDL, LDLCALC, TRIG, CHOLHDL, LDLDIRECT in the last 72 hours. Thyroid function studies No results for input(s): TSH, T4TOTAL, T3FREE, THYROIDAB in the last 72 hours.  Invalid input(s): FREET3 Anemia work up No results for input(s): VITAMINB12, FOLATE, FERRITIN, TIBC, IRON, RETICCTPCT in the last 72 hours. Urinalysis    Component Value Date/Time   COLORURINE YELLOW 12/24/2018 2121   APPEARANCEUR CLEAR 12/24/2018 2121   LABSPEC 1.030 12/24/2018 2121   PHURINE 6.0 12/24/2018 2121   GLUCOSEU >=500 (A) 12/24/2018 2121   HGBUR SMALL (A) 12/24/2018 2121   BILIRUBINUR NEGATIVE 12/24/2018 2121   KETONESUR NEGATIVE 12/24/2018 2121   PROTEINUR NEGATIVE 12/24/2018 2121   UROBILINOGEN 0.2 02/21/2015 0900   NITRITE NEGATIVE 12/24/2018 2121   LEUKOCYTESUR NEGATIVE 12/24/2018 2121   Sepsis Labs Invalid  input(s): PROCALCITONIN,  WBC,  LACTICIDVEN Microbiology Recent Results (from the past 240 hour(s))  Resp Panel by RT-PCR (Flu A&B, Covid) Nasopharyngeal Swab     Status: None   Collection Time: 03/10/21  8:49 PM   Specimen: Nasopharyngeal Swab; Nasopharyngeal(NP) swabs in vial transport medium  Result Value Ref Range Status   SARS Coronavirus 2 by RT PCR NEGATIVE NEGATIVE Final    Comment: (NOTE) SARS-CoV-2 target nucleic acids are NOT DETECTED.  The SARS-CoV-2 RNA is generally detectable in upper respiratory specimens during the acute phase of infection. The lowest concentration of SARS-CoV-2 viral copies this assay can detect is 138 copies/mL. A negative result does not preclude SARS-Cov-2 infection and should not be used as the sole basis for treatment or other patient management decisions. A negative result may occur with  improper specimen collection/handling, submission of specimen other than nasopharyngeal swab, presence of viral mutation(s) within the areas targeted by this assay, and inadequate number of viral copies(<138 copies/mL). A negative result must be combined with clinical observations, patient history, and epidemiological information. The expected result is Negative.  Fact Sheet for Patients:  BloggerCourse[employer]com  Fact Sheet for Healthcare Providers:  SeriousBroker.it  This test is no t yet approved or cleared by the [place] FDA and  has been authorized for detection and/or diagnosis of SARS-CoV-2 by FDA under an Emergency Use Authorization (EUA). This EUA will remain  in effect (meaning this test can be used) for the duration of the COVID-19 declaration under Section 564(b)(1) of the Act, 21 U.S.C.section 360bbb-3(b)(1), unless the authorization is terminated  or revoked sooner.       Influenza A by PCR NEGATIVE NEGATIVE Final   Influenza B by PCR NEGATIVE NEGATIVE Final    Comment: (NOTE) The Xpert  Xpress SARS-CoV-2/FLU/RSV plus assay is intended as an aid in the diagnosis of influenza from Nasopharyngeal swab specimens and should not be used as a sole basis for treatment. Nasal washings and aspirates are unacceptable for Xpert Xpress SARS-CoV-2/FLU/RSV testing.  Fact Sheet for Patients: BloggerCourse.com  Fact Sheet for Healthcare Providers: SeriousBroker.it  This test is not yet approved or cleared by the [place] FDA and has been authorized for detection and/or diagnosis of SARS-CoV-2 by FDA under an Emergency Use Authorization (EUA). This EUA will remain in effect (meaning this test can be used) for the duration of the COVID-19 declaration under Section 564(b)(1) of the Act, 21 U.S.C. section 360bbb-3(b)(1), unless the authorization is terminated or revoked.  Performed at [facility], [address], [place], [place] [address]      Time coordinating discharge:  38 minutes  SIGNED:   Alwyn Ren, MD  [facility] 03/11/2021, 11:55 AM

## 2021-03-11 NOTE — Progress Notes (Signed)
New Admission Note:  Arrival Method: Stretcher Mental Orientation: Alert and oriented x 4 Telemetry: Box 13 NSR Assessment: Completed Skin: Warm and dry IV: NSL Pain: 9/10 Headache Tubes: [name] Safety Measures: Safety Fall Prevention Plan initiated.  Admission: Completed 5 M  Orientation: Patient has been orientated to the room, unit and the staff. Welcome booklet given.  Family: [name]  Orders have been reviewed and implemented. Will continue to monitor the patient. Call light has been placed within reach and bed alarm has been activated.   Guilford Shi BSN, RN  Phone Number: [phone]

## 2021-03-11 NOTE — Plan of Care (Signed)
  Problem: Education: Goal: Knowledge of General Education information will improve Description: Including pain rating scale, medication(s)/side effects and non-pharmacologic comfort measures Outcome: Completed/Met

## 2021-03-11 NOTE — Progress Notes (Signed)
Report received. Her room is ready.  

## 2021-04-01 ENCOUNTER — Telehealth: Payer: Self-pay | Admitting: Neurology

## 2021-04-01 NOTE — Telephone Encounter (Signed)
Error

## 2021-04-04 NOTE — Progress Notes (Signed)
NEUROLOGY FOLLOW UP OFFICE NOTE  Patricia [name] [id number]  Assessment/Plan:    Chronic intractable migraines without aura, without status migrainosus - underlying depression is likely a factor Hemplegic migraine, right Will follow up with [facility] to try and set up for Vyepti.  She has failed almost all other migraine preventatives (propranolol, topiramate, Depakote, topiramate, zonisamide, Keppra, Lyrica, Lamictal, venlafaxine, amitriptyline, sertraline, Emgality, Aimovig, Botox).  Unfortunately, abortive therapy is limited.  She cannot take triptans or ergots due to history of stroke and hemiplegic migraine.  She has tried and failed NSAIDs but would be weary trying another one as she is already taking blood thinners.    1.Migraine prevention:  Vyepti 100mg .  If no improvement, then will increase to 300mg  for second infusion 2.Migraine rescue: Reyvow 3.Consider magnesium citrate, CoQ10, riboflavin 4.Limit use of pain relievers to no more than 2 days out of week to prevent risk of rebound or medication-overuse headache. 5.  Keep headache diary 6.  Follow up with PCP regarding treatment of depression. 7.  Follow up 6 months.  Subjective:  Patricia [name] is a 55 year old right-handed black female with HTN, diabetes, HLD and history of stroke who follows up for migraines.   UPDATE: In February, stopped Qulipta and prescribed Vyepti 100mg .  She said [facility] never called her.  Therefore, headaches are unchanged.  She has had 14 days of headache over the past 4 weeks.  She says that the Reyvow is no longer working.  She does report increased depression.  She was prescribed hydrocodone by her PCP but she has not taken it.    On 03/10/2021, she woke up in the middle of the night with headache and right lower extremity weakness, typical of her hemiplegic migraine.  Later that day, she presented to the hospital where she was admitted for stroke.  CT head personally reviewed was  negative for acute findings.  Follow up MRI of brain personally reviewed also was negative for acute findings.  Treated as migraine with cocktail of IV Depakote, Reglan, Benadryl and Ativan.  Current NSAIDS:  none Current  analgesics:  none Current triptans:  Contraindicated (CVA) Current ergotamine:  Contraindicated (CVA) Current anti-emetic:  Promethazine 25mg  Current muscle relaxants:  Tizanidine 4mg  at bedtime Current anti-anxiolytic:  none Current sleep aide:  none Current Antihypertensive medications:  none Current Antidepressant medications:  none Current Anticonvulsant medications:  Qudexy 100mg  daily Current anti-CGRP:  none Current Vitamins/Herbal/Supplements:  Magnesium oxide 400mg  daily Current Antihistamines/Decongestants:  Diphenhydramine PRN Other therapy:  Reyvow Hormone/birth control:  none   Caffeine:  1 cup of coffee once a week at most Alcohol:  rarely Smoker:  no Diet:  Hydrates Exercise:  no Depression:  mild; Anxiety:  Mild.  Currently without a job and needed to move in with her daughter. 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  Other pertinent stroke workup included negative vasculitis and hypercoagulable panel.  She has had recurrent episodes of speech difficulty and dragging of her right foot.  Repeat MRIs and EEG were negative.  Hemiplegic migraines suspected.   She started having migraines at 55 years old.  They may be a severe pounding pain that occurs behind right eye or right occipital region.  They are associated with nausea, vomiting,  osmophobia, dizziness and sometimes visual aura (objects in vision seem tilted) as well as right facial weakness and speech disturbance.  They typically last 2 days to 1 week.  20  headache days a month on average.  Triggers include certain smells (scented lotions) or change in weather.  No relieving factors.  [facility] but treatment has been ineffective.   Takes a pain reliever once a week on-average (ineffective)   Past NSAIDS:  Ibuprofen, naproxen Past analgesics:  Percocet, Tylenol, Excedrin, tramadol, acetaminophen-diphennhdramine Past abortive triptans:  Sumatriptan (side effects) Past abortive ergotamine:  none Past muscle relaxants:  none Past anti-emetic:  none Past antihypertensive medications:  Propranolol (ineffective) Past antidepressant medications:  Amitriptyline (ineffective), venlafaxine (ineffective), sertraline 50mg  Past anticonvulsant medications:  topiramate (cognitive deficits), Depakote (ineffective), gabapentin (ineffective), Keppra (ineffective), Lamictal (ineffective), Lyrica (ineffective), zonisamide 100mg  daily Past anti-CGRP:  Aimovig, Emgality, Ubrelvy, Nurtec (rescue), Qulipta 60mg  Past vitamins/Herbal/Supplements:  none Past antihistamines/decongestants:  none Other past therapies:  Botox     Family history of headache:  Son, grandson   Imaging: 05/22/2014 MRI/MRA of head: Multiple acute infarcts in left hemisphere.  Old infarctions affecting the thalami, hemispheric white matter, genu of corpus callosum and right parietal cortex at the vertex.  Advanced intracranial disease, particularly severe at the M1 segment of left MCA showing 90% stenosis. 07/03/2014 CTA Head & Neck: Severe proximal left MCA stenosis.  Mild proximal right MCA stenosis.  Bilateral MCA branch vessel irregularity and attenuation.  Occlusion of the anterior cerebral arteries at their origins with some reconstituted but severely diminished flow distally.  Mild left PT stenosis.  No evidence of cervical carotid or vertebral artery stenosis. 02/2015 MRI/MRA of head: No acute intracranial process.  Multifocal small  areas of encephalomalacia corresponding to prior left cerebral remote infarcts.  Remote bilateral basal ganglia and thalamus lacunar infarcts.  Moderate chronic small vessel ischemic changes.  Focal high-grade stenosis of left M1 segment, stable.  Chronically occluded right anterior cerebral artery with thready irregular left anterior cerebral artery, unchanged.  Thready, somewhat narrowed basilar artery, worse than prior exam though this may be accentuated by motion artifact.  Moderate stenosis left PT segment, worse than prior imaging.  Improved appearance of left middle cerebral artery from prior imaging. 01/05/2016 MRI Brain: No acute or subacute infarction.  Old infarctions affecting the thalami, basal ganglia, genu of the corpus callosum, hemispheric white matter and left and right cortical and subcortical infarctions at the vertex. 07/29/2016 MRI Brain wo: Patchy multifocal acute ischemic predominantly cortical left MCA territory infarcts. 07/28/2016 CT head/CT perfusion/CTA head and Neck: Possible nonocclusive thrombus in left MCA stent.  Abnormally prolonged transit throughout the left MCA superior division without evidence of acute core infarct or large vessel occlusion.  Chronically occluded or severely diseased A1 segments.  Mild right M1 MCA stenosis. 11/2016 Cerebral angiogram: Approximately 70-75% stenosis within the midportion of the stented segment of left MCA most likely secondary to combination of near intimal hyperplasia and atherosclerotic plaque.  Hypoplastic right anterior cerebral artery A1 segment.  Nonvisualization of left anterior cerebral artery A1 segment.  Dominant posterior communicating arteries bilaterally. 06/07/2017 MRI and MRA of head: No acute intracranial abnormality or abnormal enhancement.  Small chronic infarctions in left frontal centrum semiovale and bilateral caudate heads.  Mild chronic small vessel ischemic changes.  Long segment flow-void of left M1 corresponding to  intra-arterial stent.  Bilateral M2 and distal MCA circulation symmetric.  Hypoplastic  right A1, nonvisualized left A1, and poor downstream flow related to signal in the bilateral anterior cerebral arteries, similar to prior cerebral angiogram.  Intracranial atherosclerosis with areas of stable mild to moderate stenosis in the anterior and posterior circulation. 12/20/2017 MRI/MRA of head: MRI of the brain unchanged.  Left MCA stent with associated signal loss.  50% stenosis of right M1 distally, unchanged.  Nonvisualization of either anterior cerebral artery A1 segments. 03/06/2018 MRI Brain wo: No acute intracranial abnormality.  Stable compared to prior imaging. 03/2018 Cerebral angiogram: Stable IntraStent stenosis of 75-80% within previously treated left middle cerebral artery severe stenosis with stent assisted angioplasty.  No significant change compared to prior study of February 2018.  PAST MEDICAL HISTORY: Past Medical History:  Diagnosis Date   Anemia    in the past   Asthma    " a long time ago"   Bursitis    Depression    13 years ago -    Diabetes mellitus without complication (HCC)    type  2   GERD (gastroesophageal reflux disease)    occ. acid reflux   H/O: hysterectomy    Headache    migraines   Hypertension    Kidney stones    PFO (patent foramen ovale)    hx of PFO with surgery done as an adult   Rotator cuff tear    right   Stroke [facility]) 2009   lasdt stroke July 2015   Syncope and collapse     MEDICATIONS: Current Outpatient Medications on File Prior to Visit  Medication Sig Dispense Refill   BRILINTA 90 MG TABS tablet Take 90 mg by mouth 2 (two) times daily.   2   cetirizine (ZYRTEC) 10 MG tablet Take 10 mg by mouth at bedtime.     chlorthalidone (HYGROTON) 25 MG tablet Take 25 mg by mouth at bedtime.   5   Cholecalciferol (VITAMIN D-3) 25 MCG (1000 UT) CAPS Take 1,000 Units by mouth at bedtime.     HYDROcodone-acetaminophen (NORCO/VICODIN) 5-325 MG tablet  Take 1 tablet by mouth every 4 (four) hours as needed. (Patient taking differently: Take 1 tablet by mouth every 4 (four) hours as needed (for pain).) 10 tablet 0   JARDIANCE 25 MG TABS tablet Take 25 mg by mouth at bedtime.     Lasmiditan Succinate (REYVOW) 100 MG TABS TAKE 1 TABLET BY MOUTH DAILY AS NEEDED. MAXIMUM 1 TABLET IN 24 HOURS (Patient taking differently: Take 100 mg by mouth daily as needed (for migraines- MAX OF #1 TABLET/24 HOURS).) 10 tablet 5   pravastatin (PRAVACHOL) 80 MG tablet Take 80 mg by mouth at bedtime.      promethazine (PHENERGAN) 25 MG tablet Take 1 tablet (25 mg total) by mouth every 4 (four) hours as needed for nausea or vomiting. 30 tablet 5   tiZANidine (ZANAFLEX) 4 MG tablet Take 4 mg by mouth every 6 (six) hours as needed for muscle spasms.     topiramate ER (QUDEXY XR) 100 MG CS24 sprinkle capsule Take 1 capsule (100 mg total) by mouth daily. (Patient taking differently: Take 100 mg by mouth at bedtime.) 30 capsule 5   No current facility-administered medications on file prior to visit.    ALLERGIES: Allergies  Allergen Reactions   Imitrex [Sumatriptan] Anaphylaxis   Triptans Anaphylaxis and Hives   Plavix [Clopidogrel Bisulfate] Hives   Zofran [Ondansetron] Nausea Only   Cymbalta [Duloxetine Hcl] Nausea Only   Lipitor [Atorvastatin] Rash   Topamax [Topiramate] Other (  See Comments)    Memory issues    FAMILY HISTORY: Family History  Problem Relation Age of Onset   Hypertension Mother    Hypertension Father    Cancer Maternal Aunt    Breast cancer Maternal Aunt    Cancer Maternal Uncle       Objective:  Blood pressure (!) 144/90, pulse 100, height 5\' 5"  (1.651 m), weight 185 lb 6.4 oz (84.1 kg), SpO2 97 %. General: Tearful.  Patient appears well-groomed.   Head:  Normocephalic/atraumatic Eyes:  Fundi examined but not visualized Neck: supple, no paraspinal tenderness, full range of motion Heart:  Regular rate and rhythm Lungs:  Clear to  auscultation bilaterally Back: No paraspinal tenderness Neurological Exam: alert and oriented to person, place, and time.  Speech fluent and not dysarthric, language intact.  Endorses reduced right V1-V3 sensation.  Otherwise, CN II-XII intact. Bulk and tone normal, muscle strength 5/5 throughout.  Give way weakness right ankle dorsiflexion.  Sensation to light touch intact.  Deep tendon reflexes 2+ throughout.  Finger to nose testing intact.  Gait normal, Romberg negative.    , DO  CC: Shon Millet, MD

## 2021-04-05 ENCOUNTER — Ambulatory Visit: Payer: Medicaid Other | Admitting: Neurology

## 2021-04-05 ENCOUNTER — Encounter: Payer: Self-pay | Admitting: Neurology

## 2021-04-05 ENCOUNTER — Other Ambulatory Visit: Payer: Self-pay

## 2021-04-05 VITALS — BP 144/90 | HR 100 | Ht 65.0 in | Wt 185.4 lb

## 2021-04-05 DIAGNOSIS — I63512 Cerebral infarction due to unspecified occlusion or stenosis of left middle cerebral artery: Secondary | ICD-10-CM | POA: Diagnosis not present

## 2021-04-05 DIAGNOSIS — G43409 Hemiplegic migraine, not intractable, without status migrainosus: Secondary | ICD-10-CM | POA: Diagnosis not present

## 2021-04-05 DIAGNOSIS — G43711 Chronic migraine without aura, intractable, with status migrainosus: Secondary | ICD-10-CM

## 2021-04-05 NOTE — Patient Instructions (Signed)
  Will try to get Vyepti started ASAP.  Will start with 100mg .  If not effective, then 2 weeks before next infusion, contact me and I will increase dose.  Continue Qudexy 100mg  daily Take Reyvow as needed. Limit use of pain relievers to no more than 2 days out of the week.  These medications include acetaminophen, NSAIDs (ibuprofen/Advil/Motrin, naproxen/Aleve, triptans (Imitrex/sumatriptan), Excedrin, and narcotics.  This will help reduce risk of rebound headaches. Be aware of common food triggers:  - Caffeine:  coffee, black tea, cola, Mt. Dew  - Chocolate  - Dairy:  aged cheeses (brie, blue, cheddar, gouda, [place], provolone, [place], Swiss, etc), chocolate milk, buttermilk, sour cream, limit eggs and yogurt  - Nuts, peanut butter  - Alcohol  - Cereals/grains:  FRESH breads (fresh bagels, sourdough, doughnuts), yeast productions  - Processed/canned/aged/cured meats (pre-packaged deli meats, hotdogs)  - MSG/glutamate:  soy sauce, flavor enhancer, pickled/preserved/marinated foods  - Sweeteners:  aspartame (Equal, Nutrasweet).  Sugar and Splenda are okay  - Vegetables:  legumes (lima beans, lentils, snow peas, fava beans, pinto peans, peas, garbanzo beans), sauerkraut, onions, olives, pickles  - Fruit:  avocados, bananas, citrus fruit (orange, lemon, grapefruit), mango  - Other:  Frozen meals, macaroni and cheese Routine exercise Stay adequately hydrated (aim for 64 oz water daily) Keep headache diary Maintain proper stress management Maintain proper sleep hygiene Do not skip meals Consider supplements:  magnesium citrate 400mg  daily, riboflavin 400mg  daily, coenzyme Q10 100mg  three times daily.

## 2021-04-06 ENCOUNTER — Telehealth: Payer: Self-pay | Admitting: Neurology

## 2021-04-06 NOTE — Telephone Encounter (Signed)
Aleathia Viroqua Key: [facility] - PA Case ID: [id number] Need help? Call us at [phone] Status Sent to [employer] Drug Vyepti 100MG /ML solution Form [facility] [facility] Electronic [facility] Form [phone] NCPDP)  Waiting on reply

## 2021-04-07 ENCOUNTER — Telehealth: Payer: Self-pay | Admitting: Neurology

## 2021-04-07 ENCOUNTER — Telehealth: Payer: Self-pay

## 2021-04-07 NOTE — Telephone Encounter (Signed)
Aisley Rosedale Key: [facility] - PA Case ID: [id number] Need help? Call us at [phone] Outcome Denied on June 15 PA Case: [id number], Status: Denied. Notification: Completed. Drug Vyepti 100MG /ML solution Form [facility] [facility] Electronic [facility] Form [phone] NCPDP)

## 2021-04-07 NOTE — Telephone Encounter (Signed)
PA denied advised pt, Please come by the office to fill out Vypeti assistance forms. We will still do an appeal with pt insurance.    Pt will stop by to sign forms.

## 2021-04-11 NOTE — Progress Notes (Signed)
Forms filled and faxed

## 2021-04-20 ENCOUNTER — Telehealth: Payer: Self-pay | Admitting: Neurology

## 2021-04-20 NOTE — Telephone Encounter (Signed)
Timika, a Vyepti rep, called to see if a PA is wanted for the denial for the Vyepti.

## 2021-04-21 NOTE — Telephone Encounter (Signed)
Pt advised to fill out Appeal consent and bring by the office so we can start Appeal process. [place] Assistance toll fee number given to pt. [phone]

## 2021-05-04 NOTE — Progress Notes (Signed)
Vyepti approved 05/04/21-10/31/21

## 2021-05-21 ENCOUNTER — Other Ambulatory Visit: Payer: Self-pay | Admitting: Neurology

## 2021-05-26 ENCOUNTER — Other Ambulatory Visit: Payer: Self-pay | Admitting: Neurology

## 2021-05-26 ENCOUNTER — Telehealth: Payer: Self-pay

## 2021-05-26 NOTE — Telephone Encounter (Signed)
[facility] infusion update form received. Infusion given 05/25/21 pt tolerated well no problems.   Next appt: 08/31/21 at 10 am.  Form sent to scan.

## 2021-06-02 ENCOUNTER — Other Ambulatory Visit: Payer: Self-pay | Admitting: Neurology

## 2021-06-08 ENCOUNTER — Ambulatory Visit: Payer: Medicaid Other | Admitting: Neurology

## 2021-06-20 ENCOUNTER — Telehealth: Payer: Self-pay

## 2021-06-20 NOTE — Telephone Encounter (Signed)
New message   Message from Plan Available without authorization.   Reyvow 100MG  tablets   Form [facility] [facility] Electronic [facility] Form [phone] NCPDP) Created 19 days ago Sent to Plan 1 minute ago Plan Response 1 minute ago Submit Clinical Questions Determination 3 days ago

## 2021-07-11 ENCOUNTER — Other Ambulatory Visit: Payer: Self-pay | Admitting: Neurology

## 2021-07-15 ENCOUNTER — Other Ambulatory Visit: Payer: Self-pay | Admitting: Family Medicine

## 2021-07-15 DIAGNOSIS — Z1231 Encounter for screening mammogram for malignant neoplasm of breast: Secondary | ICD-10-CM

## 2021-07-23 ENCOUNTER — Other Ambulatory Visit: Payer: Self-pay | Admitting: Neurology

## 2021-07-25 ENCOUNTER — Other Ambulatory Visit: Payer: Self-pay | Admitting: Neurology

## 2021-08-10 ENCOUNTER — Telehealth: Payer: Self-pay | Admitting: Neurology

## 2021-08-10 NOTE — Telephone Encounter (Signed)
Called [employer] earlier to get new form faxed over.

## 2021-08-10 NOTE — Telephone Encounter (Signed)
Leven from [employer] called wanting to know if we recvd the form to be filled out? He wanted jaffe to know this doesn't need to be sent to medical records, this form needs to be filled out by [facility]  [phone] opt 3

## 2021-08-16 NOTE — Telephone Encounter (Signed)
Records faxed over.

## 2021-08-17 ENCOUNTER — Other Ambulatory Visit: Payer: Self-pay

## 2021-08-17 ENCOUNTER — Ambulatory Visit
Admission: RE | Admit: 2021-08-17 | Discharge: 2021-08-17 | Disposition: A | Discharge status: Home / Self Care | Payer: Medicaid Other | Source: Ambulatory Visit | Attending: Family Medicine | Admitting: Family Medicine

## 2021-08-17 DIAGNOSIS — Z1231 Encounter for screening mammogram for malignant neoplasm of breast: Secondary | ICD-10-CM

## 2021-09-21 ENCOUNTER — Other Ambulatory Visit: Payer: Self-pay | Admitting: Neurology

## 2021-09-21 MED ORDER — REYVOW 100 MG PO TABS: 100.0000 mg | ORAL_TABLET | Freq: Every day | ORAL | 0 refills | Status: DC | PRN

## 2021-09-21 MED ORDER — PROMETHAZINE HCL 25 MG PO TABS: ORAL_TABLET | ORAL | 2 refills | Status: DC

## 2021-09-21 NOTE — Telephone Encounter (Signed)
Patient called requesting refills on Reyvow 100 MG and promethazine 25 MG.  She has requested refills from the pharmacy to no avail.  [facility] at [address] in [place]

## 2021-09-22 ENCOUNTER — Telehealth: Payer: Self-pay

## 2021-09-22 NOTE — Telephone Encounter (Signed)
New message   Dutches [place] Key: [id number] help? Call us at [phone]  Outcome Additional Information Required  Prior Authorization is not required at this time. Pharmacy needs to submit override codes for Drug Utilization Review.  Drug Qulipta 60MG  tablets  Form [facility] Electronic [facility] Form [phone] NCPDP)   [facility] is unable to respond with clinical questions. Please see more information at the bottom of the page for next steps.

## 2021-10-06 ENCOUNTER — Telehealth: Payer: Self-pay

## 2021-10-06 NOTE — Telephone Encounter (Signed)
New message  Your information has been sent to [facility].  DUTCHES Leatha Gilding Key: [id number] - PA Case ID: [id number] Need help? Call us at [phone] Outcome Approvedtoday PA Case: [id number], Status: Approved, Coverage Starts on: 10/06/2021 12:00:00 AM, Coverage Ends on: 10/06/2022 12:00:00 AM. Drug Vyepti 100MG /ML solution Form [employer] [facility] Electronic [facility] Form [phone] NCPDP)

## 2021-10-10 ENCOUNTER — Other Ambulatory Visit: Payer: Self-pay | Admitting: Neurology

## 2021-10-11 NOTE — Telephone Encounter (Signed)
Enough given until appt with [facility] 10/19/2021

## 2021-10-18 NOTE — Progress Notes (Deleted)
NEUROLOGY FOLLOW UP OFFICE NOTE  RICKY DOAN [id number]  Assessment/Plan:   Chronic migraine without aura, without status migrainosus, intractable Right sided hemiplegic migraine  Migraine prevention:  *** Migraine rescue:  *** Limit use of pain relievers to no more than 2 days out of week to prevent risk of rebound or medication-overuse headache. Keep headache diary Follow up ***   Subjective:  Patricia [name] is a 55 year old right-handed black female with HTN, diabetes, HLD and history of stroke who follows up for migraines.   UPDATE: Started Patricia [name] in August.  Delay due to pushback from insurance.     Admitted to [facility] on 08/29/2021 for headache and acute onset worsening of right sided weakness.  MRI of brain negative for stroke and she was diagnosed with migraine.  Treated with IV Depakene.  Workup included: CT HEAD:  NAICA.  Remote infarcts stable (left MCA, bilateral frontal lobes, left caudate, left thalamus). MRI BRAIN:  No acute stroke.  Remote infarcts in multiple vascular territory, especially left MCA territory CTA HEAD & NECK:  Prior stenting of the carotid terminus through the left M1 segment of the MCA with multiple intraluminal filling defects and high-grade narrowing within the stent concerning for in-stent thrombus.  Long segment moderate to high-grade stenosis of the right M1 segment of the MCA.  NO right MCA distribution cut of identified.   TTE:  LVEF 55-60%.  Trivial right to left shunt.  ASD occluder device noted in the atrial septum with no residual color flow. After discharge, she received ***   Current NSAIDS:  none Current  analgesics:  none Current triptans:  Contraindicated (CVA) Current ergotamine:  Contraindicated (CVA) Current anti-emetic:  Promethazine 25mg  Current muscle relaxants:  Tizanidine 4mg  at bedtime Current anti-anxiolytic:  none Current sleep aide:  none Current Antihypertensive medications:   none Current Antidepressant medications:  none Current Anticonvulsant medications:  Qudexy 100mg  daily Current anti-CGRP:  none Current Vitamins/Herbal/Supplements:  Magnesium oxide 400mg  daily Current Antihistamines/Decongestants:  Diphenhydramine PRN Other therapy:  Reyvow Hormone/birth control:  none   Caffeine:  1 cup of coffee once a week at most Alcohol:  rarely Smoker:  no Diet:  Hydrates Exercise:  no Depression:  mild; Anxiety:  Mild.  Currently without a job and needed to move in with her daughter. 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  Other pertinent stroke workup included negative vasculitis and hypercoagulable panel.  She has had recurrent episodes of speech difficulty and dragging of her right foot.  Repeat MRIs and EEG were negative.  Hemiplegic migraines suspected.   She started having migraines at 55 years old.  They may be a severe pounding pain that occurs behind right eye or right occipital region.  They are associated with nausea, vomiting, osmophobia, dizziness and sometimes visual aura (objects in vision seem tilted) as well as right facial weakness and speech disturbance.  They typically last 2 days to 1 week.  20 headache days a month on average.  Triggers include certain smells (scented lotions) or change in weather.  No relieving factors.  She has been followed by headache specialist at [facility] as well as at [facility] but treatment has been ineffective.   Takes a pain reliever once a week  on-average (ineffective)   Past NSAIDS:  Ibuprofen, naproxen Past analgesics:  Percocet, Tylenol, Excedrin, tramadol, acetaminophen-diphennhdramine Past abortive triptans:  Sumatriptan (side effects) Past abortive ergotamine:  none Past muscle  relaxants:  none Past anti-emetic:  none Past antihypertensive medications:  Propranolol (ineffective) Past antidepressant medications:  Amitriptyline (ineffective), venlafaxine (ineffective), sertraline 50mg  Past anticonvulsant medications:  topiramate (cognitive deficits), Depakote (ineffective), gabapentin (ineffective), Keppra (ineffective), Lamictal (ineffective), Lyrica (ineffective), zonisamide 100mg  daily Past anti-CGRP:  Aimovig, Emgality, Ubrelvy, Nurtec (rescue), Qulipta 60mg  Past vitamins/Herbal/Supplements:  none Past antihistamines/decongestants:  none Other past therapies:  Botox, Reyvow     Family history of headache:  Son, grandson   Imaging: 05/22/2014 MRA HEAD: Multiple acute infarcts in left hemisphere.  Old infarctions affecting the thalami, hemispheric white matter, genu of corpus callosum and right parietal cortex at the vertex.  Advanced intracranial disease, particularly severe at the M1 segment of left MCA showing 90% stenosis. 07/03/2014 CTA HEAD & NECK: Severe proximal left MCA stenosis.  Mild proximal right MCA stenosis.  Bilateral MCA branch vessel irregularity and attenuation.  Occlusion of the anterior cerebral arteries at their origins with some reconstituted but severely diminished flow distally.  Mild left PT stenosis.  No evidence of cervical carotid or vertebral artery stenosis. 02/2015 MRI/MRA HEAD No acute intracranial process.  Multifocal small areas of encephalomalacia corresponding to prior left cerebral remote infarcts.  Remote bilateral basal ganglia and thalamus lacunar infarcts.  Moderate chronic small vessel ischemic changes.  Focal high-grade stenosis of left M1 segment, stable.  Chronically occluded right anterior cerebral artery with thready irregular left anterior cerebral artery, unchanged.  Thready, somewhat narrowed basilar artery, worse than prior exam though this may be accentuated by motion artifact.  Moderate stenosis left PT segment, worse  than prior imaging.  Improved appearance of left middle cerebral artery from prior imaging. 01/05/2016 MRI BRAIN: No acute or subacute infarction.  Old infarctions affecting the thalami, basal ganglia, genu of the corpus callosum, hemispheric white matter and left and right cortical and subcortical infarctions at the vertex. 07/29/2016 MRI BRAIN: Patchy multifocal acute ischemic predominantly cortical left MCA territory infarcts. 07/28/2016 CT HEAD/CT PERFUSION/CTA HEAD & NECK: Possible nonocclusive thrombus in left MCA stent.  Abnormally prolonged transit throughout the left MCA superior division without evidence of acute core infarct or large vessel occlusion.  Chronically occluded or severely diseased A1 segments.  Mild right M1 MCA stenosis. 11/2016 CEREBRAL ANGIOGRAM: Approximately 70-75% stenosis within the midportion of the stented segment of left MCA most likely secondary to combination of near intimal hyperplasia and atherosclerotic plaque.  Hypoplastic right anterior cerebral artery A1 segment.  Nonvisualization of left anterior cerebral artery A1 segment.  Dominant posterior communicating arteries bilaterally. 06/07/2017 MRI/MRA HEAD: No acute intracranial abnormality or abnormal enhancement.  Small chronic infarctions in left frontal centrum semiovale and bilateral caudate heads.  Mild chronic small vessel ischemic changes.  Long segment flow-void of left M1 corresponding to intra-arterial stent.  Bilateral M2 and distal MCA circulation symmetric.  Hypoplastic right A1, nonvisualized left A1, and poor downstream flow related to signal in the bilateral anterior cerebral arteries, similar to prior cerebral angiogram.  Intracranial atherosclerosis with areas of stable mild to moderate stenosis in the anterior and posterior circulation. 12/20/2017 MRI/MRA HEAD MRI of the brain unchanged.  Left MCA stent with associated signal loss.  50% stenosis of right M1 distally, unchanged.  Nonvisualization of either  anterior cerebral artery A1 segments. 03/06/2018 MRI BRAIN: No acute intracranial abnormality.  Stable compared to prior imaging.  03/2018 Cerebral angiogram: Stable IntraStent stenosis of 75-80% within previously treated left middle cerebral artery severe stenosis with stent assisted angioplasty.  No significant change compared to prior study of February 2018. 03/10/2021 CT & CT PERFUSION HEAD/CTA HEAD & NECK:  Unchanged occlusion of both A1 segments with worsened opacification of A2 segments, unchanged position of left MCA stent with preserves patency, no emergent LVO or high-grade stenosis or acute intracranial abnormality. 03/10/2021 MRI BRAIN:  Small chronic infarcts of corona radiata bilaterally, no acute intracranial abnormality  PAST MEDICAL HISTORY: Past Medical History:  Diagnosis Date   Anemia    in the past   Asthma    " a long time ago"   Bursitis    Depression    13 years ago -    Diabetes mellitus without complication ([place])    type  2   GERD (gastroesophageal reflux disease)    occ. acid reflux   H/O: hysterectomy    Headache    migraines   Hypertension    Kidney stones    PFO (patent foramen ovale)    hx of PFO with surgery done as an adult   Rotator cuff tear    right   Stroke [facility]) 2009   lasdt stroke July 2015   Syncope and collapse     MEDICATIONS: Current Outpatient Medications on File Prior to Visit  Medication Sig Dispense Refill   BRILINTA 90 MG TABS tablet Take 90 mg by mouth 2 (two) times daily.   2   cetirizine (ZYRTEC) 10 MG tablet Take 10 mg by mouth at bedtime.     chlorthalidone (HYGROTON) 25 MG tablet Take 25 mg by mouth at bedtime.   5   Cholecalciferol (VITAMIN D-3) 25 MCG (1000 UT) CAPS Take 1,000 Units by mouth at bedtime.     HYDROcodone-acetaminophen (NORCO/VICODIN) 5-325 MG tablet Take 1 tablet by mouth every 4 (four) hours as needed. (Patient taking differently: Take 1 tablet by mouth every 4 (four) hours as needed (for pain).) 10 tablet  0   JARDIANCE 25 MG TABS tablet Take 25 mg by mouth at bedtime.     Lasmiditan Succinate (REYVOW) 100 MG TABS Take 100 mg by mouth daily as needed. 10 tablet 0   pravastatin (PRAVACHOL) 80 MG tablet Take 80 mg by mouth at bedtime.      promethazine (PHENERGAN) 25 MG tablet TAKE 1 TABLET(25 MG) BY MOUTH EVERY 4 HOURS AS NEEDED FOR NAUSEA OR VOMITING Strength: 25 mg 30 tablet 2   tiZANidine (ZANAFLEX) 4 MG tablet Take 4 mg by mouth every 6 (six) hours as needed for muscle spasms.     topiramate ER (QUDEXY XR) 100 MG CS24 sprinkle capsule TAKE ONE CAPSULE BY MOUTH EVERY DAY 8 capsule 0   No current facility-administered medications on file prior to visit.    ALLERGIES: Allergies  Allergen Reactions   Imitrex [Sumatriptan] Anaphylaxis   Triptans Anaphylaxis and Hives   Plavix [Clopidogrel Bisulfate] Hives   Zofran [Ondansetron] Nausea Only   Cymbalta [Duloxetine Hcl] Nausea Only   Lipitor [Atorvastatin] Rash   Topamax [Topiramate] Other (See Comments)    Memory issues    FAMILY HISTORY: Family History  Problem Relation Age of Onset   Hypertension Mother    Hypertension Father    Cancer Maternal Aunt    Breast cancer Maternal Aunt    Cancer Maternal Uncle       Objective:  *** General: No acute distress.  Patient appears ***-groomed.   Head:  Normocephalic/atraumatic Eyes:  Fundi examined but not visualized Neck: supple, no paraspinal tenderness, full range of motion Heart:  Regular rate and rhythm Lungs:  Clear to auscultation bilaterally Back: No paraspinal tenderness Neurological Exam: alert and oriented to person, place, and time.  Speech fluent and not dysarthric, language intact.  CN II-XII intact. Bulk and tone normal, muscle strength 5/5 throughout.  Sensation to light touch intact.  Deep tendon reflexes 2+ throughout, toes downgoing.  Finger to nose testing intact.  Gait normal, Romberg negative.   Metta Clines, DO  CC: ***

## 2021-10-19 ENCOUNTER — Other Ambulatory Visit: Payer: Self-pay

## 2021-10-19 ENCOUNTER — Ambulatory Visit: Payer: Medicaid Other | Admitting: Neurology

## 2021-10-19 ENCOUNTER — Ambulatory Visit (INDEPENDENT_AMBULATORY_CARE_PROVIDER_SITE_OTHER): Payer: PRIVATE HEALTH INSURANCE | Admitting: Neurology

## 2021-10-19 ENCOUNTER — Encounter: Payer: Self-pay | Admitting: Neurology

## 2021-10-19 VITALS — BP 128/79 | HR 75 | Ht 65.0 in | Wt 183.2 lb

## 2021-10-19 DIAGNOSIS — I63512 Cerebral infarction due to unspecified occlusion or stenosis of left middle cerebral artery: Secondary | ICD-10-CM

## 2021-10-19 DIAGNOSIS — G43409 Hemiplegic migraine, not intractable, without status migrainosus: Secondary | ICD-10-CM

## 2021-10-19 DIAGNOSIS — M5481 Occipital neuralgia: Secondary | ICD-10-CM | POA: Diagnosis not present

## 2021-10-19 DIAGNOSIS — G43019 Migraine without aura, intractable, without status migrainosus: Secondary | ICD-10-CM | POA: Diagnosis not present

## 2021-10-19 NOTE — Progress Notes (Signed)
NEUROLOGY FOLLOW UP OFFICE NOTE  LITHA LAMARTINA [id number]  Assessment/Plan:    Intractable migraines without aura, without status migrainosus - she has had improvement since starting Vyepti.  Migraines are less frequent.  To optimize control, would like to increase dose to 300mg . Hemplegic migraine, right.  She has failed almost all other migraine preventatives (propranolol, topiramate, Depakote, topiramate, zonisamide, Keppra, Lyrica, Lamictal, venlafaxine, amitriptyline, sertraline, Emgality, Aimovig, Botox).  Unfortunately, abortive therapy is limited.  She cannot take triptans or ergots due to history of stroke and hemiplegic migraine.  She has tried and failed NSAIDs but would be weary trying another one as she is already taking blood thinners.   Right-sided occipital neuralgia.   1.Migraine prevention:  Increase Vyepti to 300mg .   2.Migraine rescue: Reyvow - advised to take at earliest onset of migraine 3. Schedule for occipital nerve block. 4.Consider magnesium citrate, CoQ10, riboflavin 5.Limit use of pain relievers to no more than 2 days out of week to prevent risk of rebound or medication-overuse headache. 6.  Keep headache diary 7.  Follow up with PCP regarding treatment of depression. 8.  Follow up 7 months.  Subjective:  Perlie Stene is a 55 year old right-handed black female with HTN, diabetes, HLD and history of stroke who follows up for migraines.   UPDATE: Status post 2 rounds of Vyepti.  Reports no improvement. Intensity:  severe Duration:  Lasting a couple of days.  However, she waits several hours before taking Reyvow.   Frequency:  6 times in December (total of 12 days)      Current NSAIDS:  none Current  analgesics:  none Current triptans:  Contraindicated (CVA) Current ergotamine:  Contraindicated (CVA) Current anti-emetic:  Promethazine 25mg  Current muscle relaxants:  Tizanidine 4mg  at bedtime Current anti-anxiolytic:  none Current  sleep aide:  none Current Antihypertensive medications:  none Current Antidepressant medications:  none Current Anticonvulsant medications:  Qudexy 100mg  daily Current anti-CGRP:  Vyepti Current Vitamins/Herbal/Supplements:  Magnesium oxide 400mg  daily Current Antihistamines/Decongestants:  Diphenhydramine PRN Other therapy:  Reyvow Hormone/birth control:  none   Caffeine:  1 cup of coffee once a week at most Alcohol:  rarely Smoker:  no Diet:  Hydrates Exercise:  no Depression:  mild; Anxiety:  Mild.  Currently without a job and needed to move in with her daughter. 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  Other pertinent stroke workup included negative vasculitis and hypercoagulable panel.  She has had recurrent episodes of speech difficulty and dragging of her right foot.  Repeat MRIs and EEG were negative.  Hemiplegic migraines suspected.   She started having migraines at 55 years old.  They may be a severe pounding pain that occurs behind right eye or right occipital region.  They are associated with nausea, vomiting, osmophobia, dizziness and sometimes visual aura (objects in vision seem tilted) as well as right facial weakness and speech disturbance.  They typically last 2 days to 1 week.  20 headache days a month on average.  Triggers include certain smells (scented lotions) or change in weather.  No relieving factors.  She has been followed by headache specialist at [facility] as well as at [facility] but treatment has been ineffective.  On 03/10/2021, she woke up in the middle  of the night with headache and right lower extremity weakness, typical of her hemiplegic migraine.  Later that day, she presented to the hospital where she was admitted for stroke.  CT head  personally reviewed was negative for acute findings.  Follow up MRI of brain personally reviewed also was negative for acute findings.  Treated as migraine with cocktail of IV Depakote, Reglan, Benadryl and Ativan.   Takes a pain reliever once a week on-average (ineffective)   Past NSAIDS:  Ibuprofen, naproxen Past analgesics:  Percocet, Tylenol, Excedrin, tramadol, acetaminophen-diphennhdramine Past abortive triptans:  Sumatriptan (side effects) Past abortive ergotamine:  none Past muscle relaxants:  none Past anti-emetic:  none Past antihypertensive medications:  Propranolol (ineffective) Past antidepressant medications:  Amitriptyline (ineffective), venlafaxine (ineffective), sertraline 50mg  Past anticonvulsant medications:  topiramate (cognitive deficits), Depakote (ineffective), gabapentin (ineffective), Keppra (ineffective), Lamictal (ineffective), Lyrica (ineffective), zonisamide 100mg  daily Past anti-CGRP:  Aimovig, Emgality, Ubrelvy, Nurtec (rescue), Qulipta 60mg  Past vitamins/Herbal/Supplements:  none Past antihistamines/decongestants:  none Other past therapies:  Botox     Family history of headache:  Son, grandson   Imaging: 05/22/2014 MRI/MRA of head: Multiple acute infarcts in left hemisphere.  Old infarctions affecting the thalami, hemispheric white matter, genu of corpus callosum and right parietal cortex at the vertex.  Advanced intracranial disease, particularly severe at the M1 segment of left MCA showing 90% stenosis. 07/03/2014 CTA Head & Neck: Severe proximal left MCA stenosis.  Mild proximal right MCA stenosis.  Bilateral MCA branch vessel irregularity and attenuation.  Occlusion of the anterior cerebral arteries at their origins with some reconstituted but severely diminished flow distally.  Mild left PT stenosis.  No evidence of cervical carotid or vertebral artery stenosis. 02/2015 MRI/MRA of head: No acute intracranial process.  Multifocal small areas of  encephalomalacia corresponding to prior left cerebral remote infarcts.  Remote bilateral basal ganglia and thalamus lacunar infarcts.  Moderate chronic small vessel ischemic changes.  Focal high-grade stenosis of left M1 segment, stable.  Chronically occluded right anterior cerebral artery with thready irregular left anterior cerebral artery, unchanged.  Thready, somewhat narrowed basilar artery, worse than prior exam though this may be accentuated by motion artifact.  Moderate stenosis left PT segment, worse than prior imaging.  Improved appearance of left middle cerebral artery from prior imaging. 01/05/2016 MRI Brain: No acute or subacute infarction.  Old infarctions affecting the thalami, basal ganglia, genu of the corpus callosum, hemispheric white matter and left and right cortical and subcortical infarctions at the vertex. 07/29/2016 MRI Brain wo: Patchy multifocal acute ischemic predominantly cortical left MCA territory infarcts. 07/28/2016 CT head/CT perfusion/CTA head and Neck: Possible nonocclusive thrombus in left MCA stent.  Abnormally prolonged transit throughout the left MCA superior division without evidence of acute core infarct or large vessel occlusion.  Chronically occluded or severely diseased A1 segments.  Mild right M1 MCA stenosis. 11/2016 Cerebral angiogram: Approximately 70-75% stenosis within the midportion of the stented segment of left MCA most likely secondary to combination of near intimal hyperplasia and atherosclerotic plaque.  Hypoplastic right anterior cerebral artery A1 segment.  Nonvisualization of left anterior cerebral artery A1 segment.  Dominant posterior communicating arteries bilaterally. 06/07/2017 MRI and MRA of head: No acute intracranial abnormality or abnormal enhancement.  Small chronic infarctions in left frontal centrum semiovale and bilateral caudate heads.  Mild chronic small vessel ischemic changes.  Long segment flow-void of left M1 corresponding to  intra-arterial stent.  Bilateral M2 and distal MCA circulation symmetric.  Hypoplastic right A1, nonvisualized left A1,  and poor downstream flow related to signal in the bilateral anterior cerebral arteries, similar to prior cerebral angiogram.  Intracranial atherosclerosis with areas of stable mild to moderate stenosis in the anterior and posterior circulation. 12/20/2017 MRI/MRA of head: MRI of the brain unchanged.  Left MCA stent with associated signal loss.  50% stenosis of right M1 distally, unchanged.  Nonvisualization of either anterior cerebral artery A1 segments. 03/06/2018 MRI Brain wo: No acute intracranial abnormality.  Stable compared to prior imaging. 03/2018 Cerebral angiogram: Stable IntraStent stenosis of 75-80% within previously treated left middle cerebral artery severe stenosis with stent assisted angioplasty.  No significant change compared to prior study of February 2018.  PAST MEDICAL HISTORY: Past Medical History:  Diagnosis Date   Anemia    in the past   Asthma    " a long time ago"   Bursitis    Depression    13 years ago -    Diabetes mellitus without complication ([place])    type  2   GERD (gastroesophageal reflux disease)    occ. acid reflux   H/O: hysterectomy    Headache    migraines   Hypertension    Kidney stones    PFO (patent foramen ovale)    hx of PFO with surgery done as an adult   Rotator cuff tear    right   Stroke [facility]) 2009   lasdt stroke July 2015   Syncope and collapse     MEDICATIONS: Current Outpatient Medications on File Prior to Visit  Medication Sig Dispense Refill   BRILINTA 90 MG TABS tablet Take 90 mg by mouth 2 (two) times daily.   2   cetirizine (ZYRTEC) 10 MG tablet Take 10 mg by mouth at bedtime.     chlorthalidone (HYGROTON) 25 MG tablet Take 25 mg by mouth at bedtime.   5   Cholecalciferol (VITAMIN D-3) 25 MCG (1000 UT) CAPS Take 1,000 Units by mouth at bedtime.     HYDROcodone-acetaminophen (NORCO/VICODIN) 5-325 MG tablet  Take 1 tablet by mouth every 4 (four) hours as needed. (Patient taking differently: Take 1 tablet by mouth every 4 (four) hours as needed (for pain).) 10 tablet 0   JARDIANCE 25 MG TABS tablet Take 25 mg by mouth at bedtime.     Lasmiditan Succinate (REYVOW) 100 MG TABS Take 100 mg by mouth daily as needed. 10 tablet 0   pravastatin (PRAVACHOL) 80 MG tablet Take 80 mg by mouth at bedtime.      promethazine (PHENERGAN) 25 MG tablet TAKE 1 TABLET(25 MG) BY MOUTH EVERY 4 HOURS AS NEEDED FOR NAUSEA OR VOMITING Strength: 25 mg 30 tablet 2   tiZANidine (ZANAFLEX) 4 MG tablet Take 4 mg by mouth every 6 (six) hours as needed for muscle spasms.     topiramate ER (QUDEXY XR) 100 MG CS24 sprinkle capsule TAKE ONE CAPSULE BY MOUTH EVERY DAY 8 capsule 0   OZEMPIC, 0.25 OR 0.5 MG/DOSE, 2 MG/1.5ML SOPN Inject into the skin.     No current facility-administered medications on file prior to visit.    ALLERGIES: Allergies  Allergen Reactions   Imitrex [Sumatriptan] Anaphylaxis   Triptans Anaphylaxis and Hives   Plavix [Clopidogrel Bisulfate] Hives   Zofran [Ondansetron] Nausea Only   Cymbalta [Duloxetine Hcl] Nausea Only   Lipitor [Atorvastatin] Rash   Topamax [Topiramate] Other (See Comments)    Memory issues    FAMILY HISTORY: Family History  Problem Relation Age of Onset   Hypertension Mother  Hypertension Father    Cancer Maternal Aunt    Breast cancer Maternal Aunt    Cancer Maternal Uncle       Objective:  Blood pressure 128/79, pulse 75, height 5\' 5"  (1.651 m), weight 183 lb 3.2 oz (83.1 kg), SpO2 100 %. General: No acute distress.  Patient appears well-groomed.   Head:  Normocephalic/atraumatic Eyes:  Fundi examined but not visualized Neck: supple, no paraspinal tenderness, full range of motion Heart:  Regular rate and rhythm Lungs:  Clear to auscultation bilaterally Back: No paraspinal tenderness Neurological Exam: alert and oriented to person, place, and time.  Speech fluent  and not dysarthric, language intact.  CN II-XII intact. Bulk and tone normal, muscle strength 5/5 throughout.  Sensation to light touch intact.  Deep tendon reflexes 2+ throughout.  Finger to nose testing intact.  Gait normal, Romberg negative.   Metta Clines, DO  CC: Sandi Mariscal, MD

## 2021-10-19 NOTE — Patient Instructions (Signed)
Increase Vyepti to 300mg  every 3 months Schedule for right sided occipital nerve block Take Reyow at earliest onset of migraine Limit use of pain relievers to no more than 2 days out of week to prevent risk of rebound or medication-overuse headache. Promethazine for nausea Follow up 7 months.

## 2021-10-20 NOTE — Progress Notes (Signed)
New Referral needed for increase dose to 300 mg every 6 months.  Marlowe Kays to check insurance and fax approval over.

## 2021-10-21 NOTE — Progress Notes (Signed)
Fax received from [place], Referral received for the increase.

## 2021-10-26 ENCOUNTER — Telehealth: Payer: Self-pay | Admitting: Neurology

## 2021-10-26 MED ORDER — TOPIRAMATE ER 100 MG PO SPRINKLE CAP24: 100.0000 mg | EXTENDED_RELEASE_CAPSULE | Freq: Every day | ORAL | 0 refills | Status: DC

## 2021-10-26 NOTE — Telephone Encounter (Signed)
Script sent  

## 2021-10-26 NOTE — Telephone Encounter (Signed)
1. Which medications need refilled? (List name and dosage, if known) topiramate - doesn't have enough to get to 11/01/21 appt  2. Which pharmacy/location is medication to be sent to? 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

## 2021-11-01 ENCOUNTER — Other Ambulatory Visit: Payer: Self-pay

## 2021-11-01 ENCOUNTER — Ambulatory Visit (INDEPENDENT_AMBULATORY_CARE_PROVIDER_SITE_OTHER): Payer: PRIVATE HEALTH INSURANCE | Admitting: Neurology

## 2021-11-01 DIAGNOSIS — M5481 Occipital neuralgia: Secondary | ICD-10-CM

## 2021-11-01 NOTE — Progress Notes (Signed)
Procedure: Occipital nerve block  Procedure risks, benefits and alternative treatments explained to patient and they agree to proceed.   A concentration of 0.25% bupivacaine (3 ml) was mixed with 40 mg of Kenalog (1 ml). A 27 gauge needle was used for injection. The region of the right greater and lesser occipital nerves were located by palpation. The area was prepped with alcohol. A total of 1 cc of the above mixture was injected into the greater occipital nerve and 1 cc injected into the lesser occipital nerve without difficulty. The patient tolerated the procedure well.    Seanpaul Preece R. Everlena Cooper, DO

## 2021-11-04 ENCOUNTER — Telehealth: Payer: Self-pay | Admitting: Neurology

## 2021-11-04 MED ORDER — TOPIRAMATE ER 100 MG PO SPRINKLE CAP24: 100.0000 mg | EXTENDED_RELEASE_CAPSULE | Freq: Every day | ORAL | 5 refills | Status: DC

## 2021-11-04 NOTE — Telephone Encounter (Signed)
Pt called in stating when she goes to the pharmacy to get her topiramate they only give her 8 capsules. She keeps having to go back each week. Can she get a larger amount at a time?

## 2021-12-26 ENCOUNTER — Telehealth: Payer: Self-pay | Admitting: Neurology

## 2021-12-26 NOTE — Telephone Encounter (Signed)
Pls let her know all the extended-release formulations of Topiramate are capsules. If it would be switched to tablets, it would be the immediate-release Topiramate which sounds like she had side effects on in the past. ?

## 2021-12-26 NOTE — Telephone Encounter (Signed)
Does she want to go back on the immediate release tablet? ?

## 2021-12-26 NOTE — Telephone Encounter (Signed)
Per pt she is unable to afford the cost of the extended release. ? ?Patient hasn't a taken the medication for over a week now.  ?

## 2021-12-26 NOTE — Telephone Encounter (Signed)
LMOVM to call the office back.

## 2021-12-26 NOTE — Telephone Encounter (Signed)
Patient would like to know if jaffe can switch her topiramate ER 100 capsules to tablets. she changed her ins and it will cost her over 300 ? ?[facility] [address] [place]  [address]  ?

## 2021-12-27 ENCOUNTER — Other Ambulatory Visit: Payer: Self-pay | Admitting: Neurology

## 2021-12-27 MED ORDER — TOPIRAMATE 100 MG PO TABS: ORAL_TABLET | ORAL | 0 refills | Status: DC

## 2021-12-27 NOTE — Telephone Encounter (Signed)
Pat advised Topiramate 100mg  qhs sent into the pharmacy per Dr.Aqunio ?

## 2021-12-27 NOTE — Telephone Encounter (Signed)
Rx sent for Topiramate 100mg  qhs ?

## 2022-01-31 ENCOUNTER — Other Ambulatory Visit: Payer: Self-pay

## 2022-01-31 ENCOUNTER — Telehealth: Payer: Self-pay | Admitting: Neurology

## 2022-01-31 DIAGNOSIS — G43019 Migraine without aura, intractable, without status migrainosus: Secondary | ICD-10-CM

## 2022-01-31 DIAGNOSIS — G43409 Hemiplegic migraine, not intractable, without status migrainosus: Secondary | ICD-10-CM

## 2022-01-31 MED ORDER — TOPIRAMATE 100 MG PO TABS: ORAL_TABLET | ORAL | 1 refills | Status: DC

## 2022-01-31 NOTE — Telephone Encounter (Signed)
Called patient refill has been sent in  ?

## 2022-01-31 NOTE — Telephone Encounter (Signed)
Patient called and said she is needing tablets for topiramate 100 MG sent in instead of capsules so she can pay for it out of pocket. ? ?It is not covered by her insurance. ? ?[facility] [address] in [place] ?

## 2022-03-08 ENCOUNTER — Other Ambulatory Visit: Payer: Self-pay | Admitting: Internal Medicine

## 2022-03-08 DIAGNOSIS — N1832 Chronic kidney disease, stage 3b: Secondary | ICD-10-CM

## 2022-03-14 ENCOUNTER — Ambulatory Visit
Admission: RE | Admit: 2022-03-14 | Discharge: 2022-03-14 | Disposition: A | Discharge status: Home / Self Care | Payer: PRIVATE HEALTH INSURANCE | Source: Ambulatory Visit | Attending: Internal Medicine | Admitting: Internal Medicine

## 2022-03-14 DIAGNOSIS — N1832 Chronic kidney disease, stage 3b: Secondary | ICD-10-CM

## 2022-04-21 ENCOUNTER — Ambulatory Visit (INDEPENDENT_AMBULATORY_CARE_PROVIDER_SITE_OTHER): Payer: PRIVATE HEALTH INSURANCE

## 2022-04-21 ENCOUNTER — Telehealth: Payer: Self-pay | Admitting: Neurology

## 2022-04-21 DIAGNOSIS — G43019 Migraine without aura, intractable, without status migrainosus: Secondary | ICD-10-CM

## 2022-04-21 MED ORDER — METOCLOPRAMIDE HCL 5 MG/ML IJ SOLN
10.0000 mg | Freq: Once | INTRAMUSCULAR | Status: AC
  Administered 2022-04-21: 10 mg via INTRAMUSCULAR

## 2022-04-21 MED ORDER — DIPHENHYDRAMINE HCL 50 MG/ML IJ SOLN
25.0000 mg | Freq: Once | INTRAMUSCULAR | Status: AC
  Administered 2022-04-21: 25 mg via INTRAMUSCULAR

## 2022-04-21 MED ORDER — KETOROLAC TROMETHAMINE 60 MG/2ML IM SOLN
60.0000 mg | Freq: Once | INTRAMUSCULAR | Status: AC
  Administered 2022-04-21: 60 mg via INTRAMUSCULAR

## 2022-04-21 NOTE — Telephone Encounter (Signed)
Pt came into the office wanting to be seen because she has a bad migraine she can't get it to go away. It's been going on for 3 or 4 days. She is wanting to see if she can get a headache cocktail today?

## 2022-04-21 NOTE — Telephone Encounter (Signed)
Advised patient per [facility], If she has a driver, she may get one.  Patient to come by later today before 4 pm.

## 2022-05-16 NOTE — Progress Notes (Unsigned)
NEUROLOGY FOLLOW UP OFFICE NOTE  KAYDREE FEJES [id number]  Assessment/Plan:    Migraines without aura, without status migrainosus, not intractalbe Hemplegic migraine, right.  She has failed almost all other migraine preventatives (propranolol, topiramate, Depakote, topiramate, zonisamide, Keppra, Lyrica, Lamictal, venlafaxine, amitriptyline, sertraline, Emgality, Aimovig, Botox).  Unfortunately, abortive therapy is limited.  She cannot take triptans or ergots due to history of stroke and hemiplegic migraine.  She has tried and failed NSAIDs but would be weary trying another one as she is already taking blood thinners.   Right-sided occipital neuralgia. Hypertension   1.Migraine prevention:  Vyepti 300mg .  May need another prior approval since she changed insurance:  If not covered, will try Ajovy  2.Migraine rescue: Reyvow - advised to take at earliest onset of migraine - advised no driving for 8 hours after use. 3. Limit use of pain relievers to no more than 2 days out of week to prevent risk of rebound or medication-overuse headache. 4.  Keep headache diary 7.  Follow up with PCP regarding treatment of depression and blood pressure 8.  Follow up 6 months.   Subjective:  Patricia [name] is a 56 year old right-handed black female with HTN, diabetes, HLD and history of stroke who follows up for migraines.   UPDATE: Since last visit, we increased Vyepti to 300mg .  However, she never started it because she got new insurance with a new job and they won't  approve.  She also received a right occipital nerve block in January which was ineffective.  She found Reyvow helpful.   Intensity:  severe Duration: persistent Frequency:  daily She did have an intractable migraine last month requiring a headache cocktail.   Taking the topiramate and tizanidine.  Not taking any over the counter NSAIDs or pain relievers.  Rarely takes hydrocodone for back pain.  Current NSAIDS:   none Current  analgesics:  hydrocodone Current triptans:  Contraindicated (CVA) Current ergotamine:  Contraindicated (CVA) Current anti-emetic:  Promethazine 25mg  Current muscle relaxants:  Tizanidine 4mg  at bedtime Current anti-anxiolytic:  none Current sleep aide:  none Current Antihypertensive medications:  none Current Antidepressant medications:  none Current Anticonvulsant medications:  topiramate 100mg  daily Current anti-CGRP:  Vyepti Current Vitamins/Herbal/Supplements:  Magnesium oxide 400mg  daily, CoQ-10 Current Antihistamines/Decongestants:  Diphenhydramine PRN Other therapy:  Reyvow Hormone/birth control:  none   Caffeine:  1 cup of coffee once a week at most Alcohol:  rarely Smoker:  no Diet:  Hydrates Exercise:  no Depression:  mild; Anxiety:  Mild.  Currently without a job and needed to move in with her daughter. 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  Other pertinent stroke workup included negative vasculitis and hypercoagulable panel.  She has had recurrent episodes of speech difficulty and dragging of her right foot.  Repeat MRIs and EEG were negative.  Hemiplegic migraines suspected.   She started having migraines at 56 years old.  They may be a severe pounding pain that occurs behind right eye or right occipital region.  They are associated with nausea, vomiting, osmophobia, dizziness and sometimes visual aura (objects in vision seem tilted) as well as right facial weakness and speech disturbance.  They typically last 2 days to 1 week.  20 headache days a month on  average.  Triggers include certain smells (scented lotions) or change in weather.  No relieving factors.  She has been followed by headache specialist at [facility] as well as at  [facility] but treatment has been ineffective.   On 03/10/2021, she woke up in the middle of the night with headache and right lower extremity weakness, typical of her hemiplegic migraine.  Later that day, she presented to the hospital where she was admitted for stroke.  CT head personally reviewed was negative for acute findings.  Follow up MRI of brain personally reviewed also was negative for acute findings.  Treated as migraine with cocktail of IV Depakote, Reglan, Benadryl and Ativan.   Takes a pain reliever once a week on-average (ineffective)   Past NSAIDS:  Ibuprofen, naproxen Past analgesics:  Percocet, Tylenol, Excedrin, tramadol, acetaminophen-diphennhdramine Past abortive triptans:  Sumatriptan (side effects) Past abortive ergotamine:  none Past muscle relaxants:  none Past anti-emetic:  none Past antihypertensive medications:  Propranolol (ineffective) Past antidepressant medications:  Amitriptyline (ineffective), venlafaxine (ineffective), sertraline 50mg  Past anticonvulsant medications: Depakote (ineffective), gabapentin (ineffective), Keppra (ineffective), Lamictal (ineffective), Lyrica (ineffective), zonisamide 100mg  daily Past anti-CGRP:  Aimovig, Emgality, Ubrelvy, Nurtec (rescue), Qulipta 60mg  Past vitamins/Herbal/Supplements:  none Past antihistamines/decongestants:  none Other past therapies:  Botox, occipital nerve block      Family history of headache:  Son, grandson   Imaging: 05/22/2014 MRI/MRA of head: Multiple acute infarcts in left hemisphere.  Old infarctions affecting the thalami, hemispheric white matter, genu of corpus callosum and right parietal cortex at the vertex.  Advanced intracranial disease, particularly severe at the M1 segment of left MCA showing 90% stenosis. 07/03/2014 CTA Head & Neck: Severe proximal left MCA stenosis.  Mild proximal right MCA stenosis.  Bilateral MCA branch vessel irregularity and attenuation.  Occlusion of the anterior cerebral arteries  at their origins with some reconstituted but severely diminished flow distally.  Mild left PT stenosis.  No evidence of cervical carotid or vertebral artery stenosis. 02/2015 MRI/MRA of head: No acute intracranial process.  Multifocal small areas of encephalomalacia corresponding to prior left cerebral remote infarcts.  Remote bilateral basal ganglia and thalamus lacunar infarcts.  Moderate chronic small vessel ischemic changes.  Focal high-grade stenosis of left M1 segment, stable.  Chronically occluded right anterior cerebral artery with thready irregular left anterior cerebral artery, unchanged.  Thready, somewhat narrowed basilar artery, worse than prior exam though this may be accentuated by motion artifact.  Moderate stenosis left PT segment, worse than prior imaging.  Improved appearance of left middle cerebral artery from prior imaging. 01/05/2016 MRI Brain: No acute or subacute infarction.  Old infarctions affecting the thalami, basal ganglia, genu of the corpus callosum, hemispheric white matter and left and right cortical and subcortical infarctions at the vertex. 07/29/2016 MRI Brain wo: Patchy multifocal acute ischemic predominantly cortical left MCA territory infarcts. 07/28/2016 CT head/CT perfusion/CTA head and Neck: Possible nonocclusive thrombus in left MCA stent.  Abnormally prolonged transit throughout the left MCA superior division without evidence of acute core infarct or large vessel occlusion.  Chronically occluded or severely diseased A1 segments.  Mild right M1 MCA stenosis. 11/2016 Cerebral angiogram: Approximately 70-75% stenosis within the midportion of the stented segment of left MCA most likely secondary to combination of near intimal hyperplasia and atherosclerotic plaque.  Hypoplastic right anterior cerebral artery A1 segment.  Nonvisualization of left anterior cerebral artery A1 segment.  Dominant posterior communicating arteries bilaterally. 06/07/2017 MRI and MRA of head: No acute  intracranial abnormality or abnormal enhancement.  Small chronic infarctions in left frontal centrum semiovale and bilateral caudate heads.  Mild chronic small vessel ischemic changes.  Long segment flow-void of left M1 corresponding to intra-arterial stent.  Bilateral M2 and distal MCA circulation symmetric.  Hypoplastic right A1, nonvisualized left A1, and poor downstream flow related to signal in the bilateral anterior cerebral arteries, similar to prior cerebral angiogram.  Intracranial atherosclerosis with areas of stable mild to moderate stenosis in the anterior and posterior circulation. 12/20/2017 MRI/MRA of head: MRI of the brain unchanged.  Left MCA stent with associated signal loss.  50% stenosis of right M1 distally, unchanged.  Nonvisualization of either anterior cerebral artery A1 segments. 03/06/2018 MRI Brain wo: No acute intracranial abnormality.  Stable compared to prior imaging. 03/2018 Cerebral angiogram: Stable IntraStent stenosis of 75-80% within previously treated left middle cerebral artery severe stenosis with stent assisted angioplasty.  No significant change compared to prior study of February 2018.  PAST MEDICAL HISTORY: Past Medical History:  Diagnosis Date   Anemia    in the past   Asthma    " a long time ago"   Bursitis    Depression    13 years ago -    Diabetes mellitus without complication ([place])    type  2   GERD (gastroesophageal reflux disease)    occ. acid reflux   H/O: hysterectomy    Headache    migraines   Hypertension    Kidney stones    PFO (patent foramen ovale)    hx of PFO with surgery done as an adult   Rotator cuff tear    right   Stroke [facility]) 2009   lasdt stroke July 2015   Syncope and collapse     MEDICATIONS: Current Outpatient Medications on File Prior to Visit  Medication Sig Dispense Refill   BRILINTA 90 MG TABS tablet Take 90 mg by mouth 2 (two) times daily.   2   cetirizine (ZYRTEC) 10 MG tablet Take 10 mg by mouth at bedtime.      chlorthalidone (HYGROTON) 25 MG tablet Take 25 mg by mouth at bedtime.   5   Cholecalciferol (VITAMIN D-3) 25 MCG (1000 UT) CAPS Take 1,000 Units by mouth at bedtime.     HYDROcodone-acetaminophen (NORCO/VICODIN) 5-325 MG tablet Take 1 tablet by mouth every 4 (four) hours as needed. (Patient taking differently: Take 1 tablet by mouth every 4 (four) hours as needed (for pain).) 10 tablet 0   JARDIANCE 25 MG TABS tablet Take 25 mg by mouth at bedtime.     Lasmiditan Succinate (REYVOW) 100 MG TABS Take 100 mg by mouth daily as needed. 10 tablet 0   OZEMPIC, 0.25 OR 0.5 MG/DOSE, 2 MG/1.5ML SOPN Inject into the skin.     pravastatin (PRAVACHOL) 80 MG tablet Take 80 mg by mouth at bedtime.      promethazine (PHENERGAN) 25 MG tablet TAKE 1 TABLET(25 MG) BY MOUTH EVERY 4 HOURS AS NEEDED FOR NAUSEA OR VOMITING Strength: 25 mg 30 tablet 2   tiZANidine (ZANAFLEX) 4 MG tablet Take 4 mg by mouth every 6 (six) hours as needed for muscle spasms.     topiramate (TOPAMAX) 100 MG tablet Take 1 tablet every night 180 tablet 1   No current facility-administered medications on file prior to visit.    ALLERGIES: Allergies  Allergen Reactions   Imitrex [Sumatriptan] Anaphylaxis   Triptans Anaphylaxis and Hives   Plavix [Clopidogrel Bisulfate] Hives   Zofran [Ondansetron] Nausea Only   Cymbalta [  Duloxetine Hcl] Nausea Only   Lipitor [Atorvastatin] Rash   Topamax [Topiramate] Other (See Comments)    Memory issues    FAMILY HISTORY: Family History  Problem Relation Age of Onset   Hypertension Mother    Hypertension Father    Cancer Maternal Aunt    Breast cancer Maternal Aunt    Cancer Maternal Uncle       Objective:  Blood pressure (!) 169/102, pulse 72, height 5\' 5"  (1.651 m), weight 200 lb (90.7 kg), SpO2 98 %. General: No acute distress.  Patient appears well-groomed.     , DO  CC: Shon Millet, MD

## 2022-05-17 ENCOUNTER — Other Ambulatory Visit: Payer: Self-pay | Admitting: Pharmacy Technician

## 2022-05-17 ENCOUNTER — Telehealth (HOSPITAL_COMMUNITY): Payer: Self-pay | Admitting: Pharmacy Technician

## 2022-05-17 ENCOUNTER — Ambulatory Visit (INDEPENDENT_AMBULATORY_CARE_PROVIDER_SITE_OTHER): Payer: PRIVATE HEALTH INSURANCE | Admitting: Neurology

## 2022-05-17 ENCOUNTER — Telehealth: Payer: Self-pay

## 2022-05-17 ENCOUNTER — Other Ambulatory Visit (HOSPITAL_COMMUNITY): Payer: Self-pay

## 2022-05-17 ENCOUNTER — Encounter: Payer: Self-pay | Admitting: Neurology

## 2022-05-17 ENCOUNTER — Telehealth: Payer: Self-pay | Admitting: Pharmacy Technician

## 2022-05-17 VITALS — BP 169/102 | HR 72 | Ht 65.0 in | Wt 200.0 lb

## 2022-05-17 DIAGNOSIS — G43409 Hemiplegic migraine, not intractable, without status migrainosus: Secondary | ICD-10-CM

## 2022-05-17 DIAGNOSIS — G43019 Migraine without aura, intractable, without status migrainosus: Secondary | ICD-10-CM

## 2022-05-17 DIAGNOSIS — I10 Essential (primary) hypertension: Secondary | ICD-10-CM

## 2022-05-17 DIAGNOSIS — F32A Depression, unspecified: Secondary | ICD-10-CM | POA: Diagnosis not present

## 2022-05-17 MED ORDER — REYVOW 100 MG PO TABS: 100.0000 mg | ORAL_TABLET | Freq: Every day | ORAL | 5 refills | Status: DC | PRN

## 2022-05-17 NOTE — Telephone Encounter (Signed)
Patient Advocate Encounter   Received notification that prior authorization for Reyvow 100 mg is required.   PA submitted on 05/17/2022 Fax sent to [employer] Status is pending       Patricia [name], CPhT Pharmacy Patient Advocate Specialist [facility] Patient Advocate Team Direct Number: [phone]  Fax: [phone]

## 2022-05-17 NOTE — Patient Instructions (Signed)
Will try to get Vyepti 300mg  approved. If not, would go with Ajovy Reyvow sent to [facility] on [address].  No driving for 8 hours after use. Limit use of pain relievers to no more than 2 days out of week to prevent risk of rebound or medication-overuse headache. Keep headache diary Follow up 6 months.

## 2022-05-17 NOTE — Telephone Encounter (Addendum)
Patient states she has new insurance.  Awaiting call back from patient to clarify insurance information  [employer]  [employer]COMP: [phone]. 07/23/21 - current.        REF: [id number]   Auth Submission: APPROVED Payer: [employer] Medication & CPT/J Code(s) submitted: Vyepti (Eptinezumab) [phone] Route of submission (phone, fax, portal): PHONE: [phone] FAX: [phone] Auth type: Buy/Bill Units/visits requested: 300MG  IV Q3 MONTHS Reference number: Approval 05/17/22 to 05/18/23   Vyepti co-pay card: APPROVED Phone: [phone] Fax: [phone]  Medical claim processing: PAYER ID: [id number] GR: [id number] MEMBER ID: [id number]  Pharmacy claim processing: Bin: [id number] Gr: [id number] Member id: [id number]    Patient will be scheduled as soon as possible

## 2022-05-17 NOTE — Telephone Encounter (Signed)
PA team. PA needed for Reyvow.

## 2022-05-19 ENCOUNTER — Ambulatory Visit: Payer: PRIVATE HEALTH INSURANCE | Admitting: Neurology

## 2022-05-25 NOTE — Telephone Encounter (Signed)
F/u: PA is still pending. Will f/u once I have a response.

## 2022-05-29 ENCOUNTER — Other Ambulatory Visit (HOSPITAL_COMMUNITY): Payer: Self-pay

## 2022-05-29 NOTE — Telephone Encounter (Signed)
Per Test Claim Prior Authorization has been approved for #8 tablets for 30 days on 05/17/2022   Roland Earl, CPHT Pharmacy Patient Advocate Specialist [facility] Patient Advocate Team Direct Number: [phone]  Fax: [phone]

## 2022-05-31 ENCOUNTER — Encounter: Payer: Self-pay | Admitting: Neurology

## 2022-05-31 NOTE — Telephone Encounter (Signed)
Patricia [name], [facility]) Patient has been approved for vyepti.  We will schedule the patient as soon as possible

## 2022-06-20 ENCOUNTER — Ambulatory Visit: Payer: PRIVATE HEALTH INSURANCE

## 2022-07-06 ENCOUNTER — Ambulatory Visit (INDEPENDENT_AMBULATORY_CARE_PROVIDER_SITE_OTHER): Payer: PRIVATE HEALTH INSURANCE

## 2022-07-06 VITALS — BP 157/95 | HR 65 | Temp 97.7°F | Resp 16 | Ht 66.0 in | Wt 197.2 lb

## 2022-07-06 DIAGNOSIS — G43019 Migraine without aura, intractable, without status migrainosus: Secondary | ICD-10-CM | POA: Diagnosis not present

## 2022-07-06 MED ORDER — SODIUM CHLORIDE 0.9 % IV SOLN
300.0000 mg | Freq: Once | INTRAVENOUS | Status: AC
  Administered 2022-07-06: 300 mg via INTRAVENOUS
  Filled 2022-07-06: qty 3

## 2022-07-06 NOTE — Progress Notes (Signed)
Diagnosis: Intractable migraine witout aura and without status migrainosus  Provider:  Chilton Greathouse MD  Procedure: Infusion  IV Type: Peripheral, IV Location: R Antecubital  Vyepti (Eptinezumab-jjmr), Dose: 300 mg  Infusion Start Time: 0920  Infusion Stop Time: 0952  Post Infusion IV Care:  Patient stayed only half(15 minutes) of observation period . IV discontinued   Discharge: Condition: Good, Destination: Home . AVS provided to patient.   Performed by:  Loney Hering, LPN

## 2022-10-05 ENCOUNTER — Ambulatory Visit: Payer: PRIVATE HEALTH INSURANCE

## 2022-11-09 ENCOUNTER — Encounter: Payer: Self-pay | Admitting: Neurology

## 2022-11-09 ENCOUNTER — Other Ambulatory Visit (HOSPITAL_COMMUNITY): Payer: Self-pay

## 2022-11-20 ENCOUNTER — Other Ambulatory Visit: Payer: Self-pay | Admitting: Neurology

## 2022-11-20 NOTE — Progress Notes (Unsigned)
NEUROLOGY FOLLOW UP OFFICE NOTE  Patricia [name] [id number]  Assessment/Plan:    Migraine without aura, without status migrainosus, not intractalbe Hemiplegic migraine, right.  She has failed almost all other migraine preventatives (propranolol, topiramate, Depakote, topiramate, zonisamide, Keppra, Lyrica, Lamictal, venlafaxine, amitriptyline, sertraline, Emgality, Aimovig, Botox).  Unfortunately, abortive therapy is limited.  She cannot take triptans or ergots due to history of stroke and hemiplegic migraine.  She has tried and failed NSAIDs but would be weary trying another one as she is already taking blood thinners.   Right-sided occipital neuralgia. Hypertension   1.Migraine prevention:  Vyepti 300mg . *** 2.Migraine rescue: Reyvow - advised to take at earliest onset of migraine - advised no driving for 8 hours after use. 3. Limit use of pain relievers to no more than 2 days out of week to prevent risk of rebound or medication-overuse headache. 4.  Keep headache diary 7.  Follow up ***   Subjective:  Patricia [name] is a 57 year old right-handed black female with HTN, diabetes, HLD and history of stroke who follows up for migraines.   UPDATE: Intensity:  severe Duration: persistent Frequency:  daily She did have an intractable migraine last month requiring a headache cocktail.   Taking the topiramate and tizanidine.  Not taking any over the counter NSAIDs or pain relievers.  Rarely takes hydrocodone for back pain.   Current NSAIDS:  none Current  analgesics:  hydrocodone Current triptans:  Contraindicated (CVA) Current ergotamine:  Contraindicated (CVA) Current anti-emetic:  Promethazine 25mg  Current muscle relaxants:  Tizanidine 4mg  at bedtime Current anti-anxiolytic:  none Current sleep aide:  none Current Antihypertensive medications:  none Current Antidepressant medications:  none Current Anticonvulsant medications:  topiramate 100mg  daily Current  anti-CGRP:  Vyepti 300mg  Current Vitamins/Herbal/Supplements:  Magnesium oxide 400mg  daily, CoQ-10 Current Antihistamines/Decongestants:  Diphenhydramine PRN Other therapy:  Reyvow Hormone/birth control:  none   Caffeine:  1 cup of coffee once a week at most Alcohol:  rarely Smoker:  no Diet:  Hydrates Exercise:  no Depression:  mild; Anxiety:  Mild.  Currently without a job and needed to move in with her daughter. 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  Other pertinent stroke workup included negative vasculitis and hypercoagulable panel.  She has had recurrent episodes of speech difficulty and dragging of her right foot.  Repeat MRIs and EEG were negative.  Hemiplegic migraines suspected.   She started having migraines at 57 years old.  They may be a severe pounding pain that occurs behind right eye or right occipital region.  They are associated with nausea, vomiting, osmophobia, dizziness and sometimes visual aura (objects in vision seem tilted) as well as right facial weakness and speech disturbance.  They typically last 2 days to 1 week.  20 headache days a month on average.  Triggers include certain smells (scented lotions) or change in weather.  No relieving factors.  She has been followed by headache specialist at [facility] as well as at [facility] but treatment has been ineffective.   On 03/10/2021, she woke up in the middle of the night with headache and right lower extremity weakness, typical of her hemiplegic migraine.  Later that day, she presented to the hospital where she was admitted for stroke.  CT  head personally reviewed was negative for acute findings.  Follow up MRI of brain personally reviewed also was negative for acute findings.  Treated as migraine  with cocktail of IV Depakote, Reglan, Benadryl and Ativan.   Takes a pain reliever once a week on-average (ineffective)   Past NSAIDS:  Ibuprofen, naproxen Past analgesics:  Percocet, Tylenol, Excedrin, tramadol, acetaminophen-diphennhdramine Past abortive triptans:  Sumatriptan (side effects) Past abortive ergotamine:  none Past muscle relaxants:  none Past anti-emetic:  none Past antihypertensive medications:  Propranolol (ineffective) Past antidepressant medications:  Amitriptyline (ineffective), venlafaxine (ineffective), sertraline 50mg  Past anticonvulsant medications: Depakote (ineffective), gabapentin (ineffective), Keppra (ineffective), Lamictal (ineffective), Lyrica (ineffective), zonisamide 100mg  daily Past anti-CGRP:  Aimovig, Emgality, Ubrelvy, Nurtec (rescue), Qulipta 60mg  Past vitamins/Herbal/Supplements:  none Past antihistamines/decongestants:  none Other past therapies:  Botox, occipital nerve block      Family history of headache:  Son, grandson   Imaging: 05/22/2014 MRI/MRA of head: Multiple acute infarcts in left hemisphere.  Old infarctions affecting the thalami, hemispheric white matter, genu of corpus callosum and right parietal cortex at the vertex.  Advanced intracranial disease, particularly severe at the M1 segment of left MCA showing 90% stenosis. 07/03/2014 CTA Head & Neck: Severe proximal left MCA stenosis.  Mild proximal right MCA stenosis.  Bilateral MCA branch vessel irregularity and attenuation.  Occlusion of the anterior cerebral arteries at their origins with some reconstituted but severely diminished flow distally.  Mild left PT stenosis.  No evidence of cervical carotid or vertebral artery stenosis. 02/2015 MRI/MRA of head: No acute intracranial process.  Multifocal small areas of encephalomalacia corresponding to prior left cerebral remote infarcts.  Remote bilateral basal ganglia and thalamus lacunar infarcts.  Moderate chronic small vessel ischemic  changes.  Focal high-grade stenosis of left M1 segment, stable.  Chronically occluded right anterior cerebral artery with thready irregular left anterior cerebral artery, unchanged.  Thready, somewhat narrowed basilar artery, worse than prior exam though this may be accentuated by motion artifact.  Moderate stenosis left PT segment, worse than prior imaging.  Improved appearance of left middle cerebral artery from prior imaging. 01/05/2016 MRI Brain: No acute or subacute infarction.  Old infarctions affecting the thalami, basal ganglia, genu of the corpus callosum, hemispheric white matter and left and right cortical and subcortical infarctions at the vertex. 07/29/2016 MRI Brain wo: Patchy multifocal acute ischemic predominantly cortical left MCA territory infarcts. 07/28/2016 CT head/CT perfusion/CTA head and Neck: Possible nonocclusive thrombus in left MCA stent.  Abnormally prolonged transit throughout the left MCA superior division without evidence of acute core infarct or large vessel occlusion.  Chronically occluded or severely diseased A1 segments.  Mild right M1 MCA stenosis. 11/2016 Cerebral angiogram: Approximately 70-75% stenosis within the midportion of the stented segment of left MCA most likely secondary to combination of near intimal hyperplasia and atherosclerotic plaque.  Hypoplastic right anterior cerebral artery A1 segment.  Nonvisualization of left anterior cerebral artery A1 segment.  Dominant posterior communicating arteries bilaterally. 06/07/2017 MRI and MRA of head: No acute intracranial abnormality or abnormal enhancement.  Small chronic infarctions in left frontal centrum semiovale and bilateral caudate heads.  Mild chronic small vessel ischemic changes.  Long segment flow-void of left M1 corresponding to intra-arterial stent.  Bilateral M2 and distal MCA circulation symmetric.  Hypoplastic right A1, nonvisualized left A1, and poor downstream flow related to signal in the bilateral  anterior cerebral arteries, similar to prior cerebral angiogram.  Intracranial atherosclerosis with areas of stable mild to moderate stenosis in the anterior  and posterior circulation. 12/20/2017 MRI/MRA of head: MRI of the brain unchanged.  Left MCA stent with associated signal loss.  50% stenosis of right M1 distally, unchanged.  Nonvisualization of either anterior cerebral artery A1 segments. 03/06/2018 MRI Brain wo: No acute intracranial abnormality.  Stable compared to prior imaging. 03/2018 Cerebral angiogram: Stable IntraStent stenosis of 75-80% within previously treated left middle cerebral artery severe stenosis with stent assisted angioplasty.  No significant change compared to prior study of February 2018.  PAST MEDICAL HISTORY: Past Medical History:  Diagnosis Date   Anemia    in the past   Asthma    " a long time ago"   Bursitis    Depression    13 years ago -    Diabetes mellitus without complication ([place])    type  2   GERD (gastroesophageal reflux disease)    occ. acid reflux   H/O: hysterectomy    Headache    migraines   Hypertension    Kidney stones    PFO (patent foramen ovale)    hx of PFO with surgery done as an adult   Rotator cuff tear    right   Stroke [facility]) 2009   lasdt stroke July 2015   Syncope and collapse     MEDICATIONS: Current Outpatient Medications on File Prior to Visit  Medication Sig Dispense Refill   BRILINTA 90 MG TABS tablet Take 90 mg by mouth 2 (two) times daily.   2   cetirizine (ZYRTEC) 10 MG tablet Take 10 mg by mouth at bedtime.     chlorthalidone (HYGROTON) 25 MG tablet Take 25 mg by mouth at bedtime.   5   Cholecalciferol (VITAMIN D-3) 25 MCG (1000 UT) CAPS Take 1,000 Units by mouth at bedtime.     HYDROcodone-acetaminophen (NORCO/VICODIN) 5-325 MG tablet Take 1 tablet by mouth every 4 (four) hours as needed. (Patient taking differently: Take 1 tablet by mouth every 4 (four) hours as needed (for pain).) 10 tablet 0   JARDIANCE 25 MG  TABS tablet Take 25 mg by mouth at bedtime.     KERENDIA 10 MG TABS Take 1 tablet by mouth daily.     Lasmiditan Succinate (REYVOW) 100 MG TABS Take 100 mg by mouth daily as needed. 16 tablet 5   lisinopril (ZESTRIL) 5 MG tablet Take 5 mg by mouth daily.     OZEMPIC, 0.25 OR 0.5 MG/DOSE, 2 MG/1.5ML SOPN Inject into the skin.     pravastatin (PRAVACHOL) 80 MG tablet Take 80 mg by mouth at bedtime.      promethazine (PHENERGAN) 25 MG tablet TAKE 1 TABLET(25 MG) BY MOUTH EVERY 4 HOURS AS NEEDED FOR NAUSEA OR VOMITING Strength: 25 mg 30 tablet 2   tiZANidine (ZANAFLEX) 4 MG tablet Take 4 mg by mouth every 6 (six) hours as needed for muscle spasms.     topiramate (TOPAMAX) 100 MG tablet Take 1 tablet every night 180 tablet 1   No current facility-administered medications on file prior to visit.    ALLERGIES: Allergies  Allergen Reactions   Imitrex [Sumatriptan] Anaphylaxis   Triptans Anaphylaxis and Hives   Plavix [Clopidogrel Bisulfate] Hives   Zofran [Ondansetron] Nausea Only   Cymbalta [Duloxetine Hcl] Nausea Only   Lipitor [Atorvastatin] Rash   Topamax [Topiramate] Other (See Comments)    Memory issues    FAMILY HISTORY: Family History  Problem Relation Age of Onset   Hypertension Mother    Hypertension Father    Cancer Maternal  Aunt    Breast cancer Maternal Aunt    Cancer Maternal Uncle       Objective:  *** General: No acute distress.  Patient appears ***-groomed.   Head:  Normocephalic/atraumatic Eyes:  Fundi examined but not visualized Neck: supple, no paraspinal tenderness, full range of motion Heart:  Regular rate and rhythm Lungs:  Clear to auscultation bilaterally Back: No paraspinal tenderness Neurological Exam: alert and oriented to person, place, and time.  Speech fluent and not dysarthric, language intact.  CN II-XII intact. Bulk and tone normal, muscle strength 5/5 throughout.  Sensation to light touch intact.  Deep tendon reflexes 2+ throughout, toes  downgoing.  Finger to nose testing intact.  Gait normal, Romberg negative.   Shon Millet, DO  CC: ***

## 2022-11-21 ENCOUNTER — Encounter: Payer: Self-pay | Admitting: Neurology

## 2022-11-21 ENCOUNTER — Ambulatory Visit: Payer: Medicaid Other | Admitting: Neurology

## 2022-11-21 VITALS — BP 148/87 | HR 76 | Ht 65.0 in | Wt 206.4 lb

## 2022-11-21 DIAGNOSIS — M5481 Occipital neuralgia: Secondary | ICD-10-CM | POA: Diagnosis not present

## 2022-11-21 DIAGNOSIS — G43819 Other migraine, intractable, without status migrainosus: Secondary | ICD-10-CM | POA: Diagnosis not present

## 2022-11-21 DIAGNOSIS — M542 Cervicalgia: Secondary | ICD-10-CM

## 2022-11-21 NOTE — Patient Instructions (Signed)
Check MRI of cervical spine Continue Vyepti, topiramte Take 1/2 tablet tizanidine up to 2 times during the day if needed.  Still take 1 tablet at bedtime. Follow up in 4 months.

## 2022-12-13 ENCOUNTER — Encounter: Payer: Self-pay | Admitting: Neurology

## 2022-12-13 ENCOUNTER — Ambulatory Visit
Admission: RE | Admit: 2022-12-13 | Discharge: 2022-12-13 | Disposition: A | Discharge status: Home / Self Care | Payer: BC Managed Care – PPO | Source: Ambulatory Visit | Attending: Neurology

## 2022-12-13 DIAGNOSIS — M5481 Occipital neuralgia: Secondary | ICD-10-CM

## 2022-12-13 DIAGNOSIS — M542 Cervicalgia: Secondary | ICD-10-CM

## 2022-12-13 DIAGNOSIS — G43819 Other migraine, intractable, without status migrainosus: Secondary | ICD-10-CM

## 2022-12-20 ENCOUNTER — Telehealth: Payer: Self-pay

## 2022-12-20 DIAGNOSIS — G589 Mononeuropathy, unspecified: Secondary | ICD-10-CM

## 2022-12-20 NOTE — Telephone Encounter (Signed)
Patient is returning phone call.  °

## 2022-12-20 NOTE — Telephone Encounter (Signed)
Tried calling patient in regards to MRI results no answer. LMOVM for patient to call back.

## 2022-12-20 NOTE — Telephone Encounter (Signed)
-----   Message from Pieter Partridge, DO sent at 12/19/2022  7:50 AM EST ----- MRI shows some arthritis in the upper neck that may be contributing to the headache.  There is some evidence of possible pinched nerve, but it is on the other side, indicating that this is likely asymptomatic.  We can refer to pain management to see if they can offer anything such as an injection in the back of the neck.

## 2022-12-21 NOTE — Telephone Encounter (Signed)
Patient advised of MRIO results. Patient wanted to knpw if she could have a refill of Zofran.

## 2022-12-21 NOTE — Telephone Encounter (Signed)
Tried calling patient no answer.

## 2022-12-22 ENCOUNTER — Other Ambulatory Visit: Payer: Self-pay | Admitting: Neurology

## 2022-12-22 MED ORDER — ONDANSETRON 4 MG PO TBDP: 4.0000 mg | ORAL_TABLET | Freq: Three times a day (TID) | ORAL | 5 refills | Status: DC | PRN

## 2022-12-22 MED ORDER — PROMETHAZINE HCL 25 MG PO TABS: ORAL_TABLET | ORAL | 2 refills | Status: DC

## 2023-01-03 ENCOUNTER — Other Ambulatory Visit (HOSPITAL_COMMUNITY): Payer: Self-pay

## 2023-01-03 ENCOUNTER — Encounter: Payer: Self-pay | Admitting: Neurology

## 2023-01-10 ENCOUNTER — Other Ambulatory Visit: Payer: Self-pay

## 2023-01-10 DIAGNOSIS — G43409 Hemiplegic migraine, not intractable, without status migrainosus: Secondary | ICD-10-CM

## 2023-01-10 DIAGNOSIS — G43019 Migraine without aura, intractable, without status migrainosus: Secondary | ICD-10-CM

## 2023-01-10 MED ORDER — REYVOW 100 MG PO TABS: 100.0000 mg | ORAL_TABLET | Freq: Every day | ORAL | 5 refills | Status: DC | PRN

## 2023-01-10 MED ORDER — TOPIRAMATE 100 MG PO TABS: ORAL_TABLET | ORAL | 1 refills | Status: DC

## 2023-01-10 NOTE — Telephone Encounter (Signed)
Letter received from [facility], Patient has new pharamcy please send script for Topiramate and Reyvow.

## 2023-01-16 ENCOUNTER — Telehealth: Payer: Self-pay

## 2023-01-16 ENCOUNTER — Other Ambulatory Visit (HOSPITAL_COMMUNITY): Payer: Self-pay

## 2023-01-16 NOTE — Telephone Encounter (Signed)
Patient Advocate Encounter   Received notification from [employer] that prior authorization is required for Reyvow 100MG  tablets   Submitted: 01-16-2023 Key  [id number]  This was faxed to the number provided by insurance plan [phone]   Status is pending

## 2023-01-31 NOTE — Telephone Encounter (Signed)
Patient Advocate Encounter  Prior Authorization for Reyvow 100MG  tablets has been approved through [employer].    Key: [id number]  Effective: 01-31-2023 to 01-16-2024

## 2023-03-27 NOTE — Progress Notes (Unsigned)
NEUROLOGY FOLLOW UP OFFICE NOTE  SPIRITUAL [facility]ACINO [id number]  Assessment/Plan:   Migraine without aura, without status migrainosus, not intractable Right-sided occipital neuralgia. Cervicalgia   1 Migraine prevention:  Vyepti 300mg  every 3 months; Topiramate 100mg  at bedtime.  Tizanidine 4mg  at bedtime *** 2.Migraine rescue: May take 1/2 tizanidine during day as well for acute flare ups.  *** 3. Limit use of pain relievers to no more than 2 days out of week to prevent risk of rebound or medication-overuse headache. 4.  Keep headache diary 7.  Follow up ***   Subjective:  Patricia [name] is a 57 year old right-handed female with HTN, diabetes, HLD and history of stroke who follows up for migraines.   UPDATE: Due to ongoing posterior headache and neck pain despite treatment, she had an MRI of cervical spine on 12/13/2022 personally reviewed revealed multilevel spondylosis most notable at C3-4 where there is left foraminal impingement and spinal stenosis with mild cord flattening as well as degenerative facet spurring on the right but patent on the right..  Intensity:  moderate to severe Duration: persistent *** Frequency:  daily but severe attacks 2 days a week.***  Current NSAIDS:  none Current  analgesics:  hydrocodone Current triptans:  Contraindicated (CVA) Current ergotamine:  Contraindicated (CVA) Current anti-emetic:  Promethazine 25mg  Current muscle relaxants:  Tizanidine 4mg  at bedtime Current anti-anxiolytic:  none Current sleep aide:  none Current Antihypertensive medications:  none Current Antidepressant medications:  none Current Anticonvulsant medications:  topiramate 100mg  daily Current anti-CGRP:  Vyepti 300mg  Current Vitamins/Herbal/Supplements:  Magnesium oxide 400mg  daily, CoQ-10 Current Antihistamines/Decongestants:  Diphenhydramine PRN Other therapy:  Reyvow Hormone/birth control:  none   Caffeine:  1 cup of coffee once a week at  most Alcohol:  rarely Smoker:  no Diet:  Hydrates Exercise:  no Depression:  mild; Anxiety:  Mild.  Currently without a job and needed to move in with her daughter. 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  Other pertinent stroke workup included negative vasculitis and hypercoagulable panel.  She has had recurrent episodes of speech difficulty and dragging of her right foot.  Repeat MRIs and EEG were negative.  Hemiplegic migraines suspected.   She started having migraines at 57 years old.  They may be a severe pounding pain that occurs behind right eye or right occipital region.  They are associated with nausea, vomiting, osmophobia, dizziness and sometimes visual aura (objects in vision seem tilted) as well as right facial weakness and speech disturbance.  They typically last 2 days to 1 week.  20 headache days a month on average.  Triggers include certain smells (scented lotions) or change in weather.  No relieving factors.  She has been followed by headache specialist at [facility] as well as at [facility] but treatment has been ineffective.   On 03/10/2021, she woke up in the middle of the night with headache and right lower extremity weakness, typical of her hemiplegic migraine.  Later that day, she presented to the hospital where she was admitted for stroke.  CT head personally reviewed was negative for acute findings.  Follow up MRI of brain personally reviewed also was negative for acute findings.  Treated as migraine with cocktail of IV Depakote, Reglan, Benadryl and Ativan.  Takes a pain reliever once a week on-average (ineffective)   Past NSAIDS:  Ibuprofen, naproxen Past analgesics:  Percocet, Tylenol, Excedrin, tramadol, acetaminophen-diphennhdramine Past  abortive triptans:  Sumatriptan (side effects) Past abortive ergotamine:  none Past muscle relaxants:  none Past anti-emetic:  none Past antihypertensive medications:  Propranolol (ineffective) Past antidepressant medications:  Amitriptyline (ineffective), venlafaxine (ineffective), sertraline 50mg  Past anticonvulsant medications: Depakote (ineffective), gabapentin (ineffective), Keppra (ineffective), Lamictal (ineffective), Lyrica (ineffective), zonisamide 100mg  daily Past anti-CGRP:  Aimovig, Emgality, Ubrelvy, Nurtec (rescue), Qulipta 60mg  Past vitamins/Herbal/Supplements:  none Past antihistamines/decongestants:  none Other past therapies:  Botox, occipital nerve block, Reyvow      Family history of headache:  Son, grandson   Imaging: 05/22/2014 MRI/MRA of head: Multiple acute infarcts in left hemisphere.  Old infarctions affecting the thalami, hemispheric white matter, genu of corpus callosum and right parietal cortex at the vertex.  Advanced intracranial disease, particularly severe at the M1 segment of left MCA showing 90% stenosis. 07/03/2014 CTA Head & Neck: Severe proximal left MCA stenosis.  Mild proximal right MCA stenosis.  Bilateral MCA branch vessel irregularity and attenuation.  Occlusion of the anterior cerebral arteries at their origins with some reconstituted but severely diminished flow distally.  Mild left PT stenosis.  No evidence of cervical carotid or vertebral artery stenosis. 02/2015 MRI/MRA of head: No acute intracranial process.  Multifocal small areas of encephalomalacia corresponding to prior left cerebral remote infarcts.  Remote bilateral basal ganglia and thalamus lacunar infarcts.  Moderate chronic small vessel ischemic changes.  Focal high-grade stenosis of left M1 segment, stable.  Chronically occluded right anterior cerebral artery with thready irregular left anterior cerebral artery, unchanged.  Thready, somewhat narrowed basilar artery, worse than prior exam  though this may be accentuated by motion artifact.  Moderate stenosis left PT segment, worse than prior imaging.  Improved appearance of left middle cerebral artery from prior imaging. 01/05/2016 MRI Brain: No acute or subacute infarction.  Old infarctions affecting the thalami, basal ganglia, genu of the corpus callosum, hemispheric white matter and left and right cortical and subcortical infarctions at the vertex. 07/29/2016 MRI Brain wo: Patchy multifocal acute ischemic predominantly cortical left MCA territory infarcts. 07/28/2016 CT head/CT perfusion/CTA head and Neck: Possible nonocclusive thrombus in left MCA stent.  Abnormally prolonged transit throughout the left MCA superior division without evidence of acute core infarct or large vessel occlusion.  Chronically occluded or severely diseased A1 segments.  Mild right M1 MCA stenosis. 11/2016 Cerebral angiogram: Approximately 70-75% stenosis within the midportion of the stented segment of left MCA most likely secondary to combination of near intimal hyperplasia and atherosclerotic plaque.  Hypoplastic right anterior cerebral artery A1 segment.  Nonvisualization of left anterior cerebral artery A1 segment.  Dominant posterior communicating arteries bilaterally. 06/07/2017 MRI and MRA of head: No acute intracranial abnormality or abnormal enhancement.  Small chronic infarctions in left frontal centrum semiovale and bilateral caudate heads.  Mild chronic small vessel ischemic changes.  Long segment flow-void of left M1 corresponding to intra-arterial stent.  Bilateral M2 and distal MCA circulation symmetric.  Hypoplastic right A1, nonvisualized left A1, and poor downstream flow related to signal in the bilateral anterior cerebral arteries, similar to prior cerebral angiogram.  Intracranial atherosclerosis with areas of stable mild to moderate stenosis in the anterior and posterior circulation. 12/20/2017 MRI/MRA of head: MRI of the brain unchanged.  Left MCA  stent with associated signal loss.  50% stenosis of right M1 distally, unchanged.  Nonvisualization of either anterior cerebral artery A1  segments. 03/06/2018 MRI Brain wo: No acute intracranial abnormality.  Stable compared to prior imaging. 03/2018 Cerebral angiogram: Stable IntraStent stenosis of 75-80% within previously treated left middle cerebral artery severe stenosis with stent assisted angioplasty.  No significant change compared to prior study of February 2018.  PAST MEDICAL HISTORY: Past Medical History:  Diagnosis Date   Anemia    in the past   Asthma    " a long time ago"   Bursitis    Depression    13 years ago -    Diabetes mellitus without complication (HCC)    type  2   GERD (gastroesophageal reflux disease)    occ. acid reflux   H/O: hysterectomy    Headache    migraines   Hypertension    Kidney stones    PFO (patent foramen ovale)    hx of PFO with surgery done as an adult   Rotator cuff tear    right   Stroke [facility]) 2009   lasdt stroke July 2015   Syncope and collapse     MEDICATIONS: Current Outpatient Medications on File Prior to Visit  Medication Sig Dispense Refill   BRILINTA 90 MG TABS tablet Take 90 mg by mouth 2 (two) times daily.   2   cetirizine (ZYRTEC) 10 MG tablet Take 10 mg by mouth at bedtime.     chlorthalidone (HYGROTON) 25 MG tablet Take 25 mg by mouth at bedtime.   5   Cholecalciferol (VITAMIN D-3) 25 MCG (1000 UT) CAPS Take 1,000 Units by mouth at bedtime.     HYDROcodone-acetaminophen (NORCO/VICODIN) 5-325 MG tablet Take 1 tablet by mouth every 4 (four) hours as needed. (Patient taking differently: Take 1 tablet by mouth every 4 (four) hours as needed (for pain).) 10 tablet 0   JARDIANCE 25 MG TABS tablet Take 25 mg by mouth at bedtime.     KERENDIA 10 MG TABS Take 1 tablet by mouth daily.     Lasmiditan Succinate (REYVOW) 100 MG TABS Take 1 tablet (100 mg total) by mouth daily as needed. 16 tablet 5   lisinopril (ZESTRIL) 5 MG tablet  Take 5 mg by mouth daily.     OZEMPIC, 0.25 OR 0.5 MG/DOSE, 2 MG/1.5ML SOPN Inject into the skin.     pravastatin (PRAVACHOL) 80 MG tablet Take 80 mg by mouth at bedtime.      promethazine (PHENERGAN) 25 MG tablet TAKE 1 TABLET(25 MG) BY MOUTH EVERY 4 HOURS AS NEEDED FOR NAUSEA OR VOMITING Strength: 25 mg 30 tablet 2   tiZANidine (ZANAFLEX) 4 MG tablet Take 4 mg by mouth every 6 (six) hours as needed for muscle spasms.     topiramate (TOPAMAX) 100 MG tablet Take 1 tablet every night 180 tablet 1   No current facility-administered medications on file prior to visit.    ALLERGIES: Allergies  Allergen Reactions   Imitrex [Sumatriptan] Anaphylaxis   Triptans Anaphylaxis and Hives   Plavix [Clopidogrel Bisulfate] Hives   Zofran [Ondansetron] Nausea Only   Cymbalta [Duloxetine Hcl] Nausea Only   Lipitor [Atorvastatin] Rash   Topamax [Topiramate] Other (See Comments)    Memory issues    FAMILY HISTORY: Family History  Problem Relation Age of Onset   Hypertension Mother    Hypertension Father    Cancer Maternal Aunt    Breast cancer Maternal Aunt    Cancer Maternal Uncle       Objective:  ***. General: No acute distress.  Patient appears well-groomed.  Head:  Normocephalic/atraumatic Eyes:  Fundi examined but not visualized Neck: supple, right greater than left upper paraspinal tenderness, reduced range of motion with neck turn and side bend to right. Heart:  Regular rate and rhythm Neurological Exam: ***   Patricia Millet, DO  CC: Salli Real, MD

## 2023-03-28 ENCOUNTER — Encounter: Payer: Self-pay | Admitting: Neurology

## 2023-03-28 ENCOUNTER — Ambulatory Visit: Payer: Medicaid Other | Admitting: Neurology

## 2023-03-28 ENCOUNTER — Ambulatory Visit: Payer: PRIVATE HEALTH INSURANCE | Admitting: Neurology

## 2023-03-28 VITALS — BP 133/86 | HR 59 | Ht 65.0 in | Wt 198.8 lb

## 2023-03-28 DIAGNOSIS — G43819 Other migraine, intractable, without status migrainosus: Secondary | ICD-10-CM | POA: Diagnosis not present

## 2023-03-28 DIAGNOSIS — G43711 Chronic migraine without aura, intractable, with status migrainosus: Secondary | ICD-10-CM | POA: Diagnosis not present

## 2023-03-28 DIAGNOSIS — M47812 Spondylosis without myelopathy or radiculopathy, cervical region: Secondary | ICD-10-CM | POA: Diagnosis not present

## 2023-03-28 DIAGNOSIS — M5481 Occipital neuralgia: Secondary | ICD-10-CM | POA: Diagnosis not present

## 2023-03-28 NOTE — Patient Instructions (Signed)
Vyepti 300mg  - contact the facility.  If you do not hear from then by Friday, contact us Reyvow as needed - contact the mail order pharmacy because it is approved Limit use of pain relievers (including hydrocodone) to no more than 2 days out of week to prevent risk of rebound or medication-overuse headache. Continue topiramate 100mg  at bedtime and tizanidine 4mg  Will refer you to pain management to see if any injections in the neck may help Follow up 6 months. Follow up with your primary care provider regarding the back and hip pain.

## 2023-04-10 ENCOUNTER — Telehealth: Payer: Self-pay | Admitting: Neurology

## 2023-04-10 NOTE — Telephone Encounter (Addendum)
Reyvow needs a Pa.

## 2023-04-10 NOTE — Telephone Encounter (Signed)
Pharmacy told patient that the medication PA has not gone through yet. Patient would like a phone call back. Patricia [name]

## 2023-04-13 ENCOUNTER — Encounter: Payer: Self-pay | Admitting: Physical Medicine & Rehabilitation

## 2023-04-16 ENCOUNTER — Telehealth: Payer: Self-pay

## 2023-04-16 ENCOUNTER — Telehealth: Payer: Self-pay | Admitting: Pharmacy Technician

## 2023-04-16 ENCOUNTER — Other Ambulatory Visit (HOSPITAL_COMMUNITY): Payer: Self-pay

## 2023-04-16 DIAGNOSIS — G43019 Migraine without aura, intractable, without status migrainosus: Secondary | ICD-10-CM

## 2023-04-16 MED ORDER — VYEPTI 100 MG/ML IV SOLN
100.0000 mg | INTRAVENOUS | 4 refills | Status: DC
  Filled 2023-04-16: qty 1, 90d supply, fill #0

## 2023-04-16 MED ORDER — VYEPTI 100 MG/ML IV SOLN
INTRAVENOUS | 4 refills | Status: DC
  Filled 2023-04-16: qty 3, fill #0
  Filled 2023-04-27: qty 3, 34d supply, fill #0
  Filled 2023-07-10: qty 3, 34d supply, fill #1

## 2023-04-16 NOTE — Telephone Encounter (Signed)
Patricia [name], Just to clarify, the tx plan entered is vyepti 300mg  q56months. Please clarify dose?

## 2023-04-16 NOTE — Telephone Encounter (Signed)
Order for Vepti 100 mg every 90 days sent to [facility].

## 2023-04-16 NOTE — Telephone Encounter (Signed)
Sheena, Thanks.   Once medication has arrived we will schedule her as soon as possible.

## 2023-04-16 NOTE — Addendum Note (Signed)
Addended by: Leida Lauth on: 04/16/2023 02:18 PM   Modules accepted: Orders

## 2023-04-16 NOTE — Addendum Note (Signed)
Addended by: Leida Lauth on: 04/16/2023 04:13 PM   Modules accepted: Orders

## 2023-04-16 NOTE — Telephone Encounter (Signed)
Received a message from [facility] that pt is ready to schedule for Vyepti. Called and no answer, left a message.

## 2023-04-16 NOTE — Telephone Encounter (Addendum)
Auth Submission: APPROVED PHARMACY BENEFIT Patient has [place] [employer] [place] Medicaid  @Sheena , Please send Vyepti script to [facility]. Once medication is received we will get patient scheduled as soon as possible.   Site of care: Site of care: [facility] Payer: [employer] [employer] Medication & CPT/J Code(s) submitted: Vyepti (Eptinezumab) [phone] Route of submission (phone, fax, portal):  [facility] Phone # [phone] Fax # Auth type: Pharmacy (pharmacy benefit due to reimbursement cost) Units/visits requested: 300mg  q3 months Reference number: KEY: [id number] Ref: [id number] Approval from:  04/16/23 - 04/15/24

## 2023-04-17 ENCOUNTER — Encounter: Payer: Self-pay | Admitting: Neurology

## 2023-04-17 ENCOUNTER — Other Ambulatory Visit (HOSPITAL_COMMUNITY): Payer: Self-pay

## 2023-04-17 NOTE — Telephone Encounter (Signed)
LMOVM advising patient of note below.

## 2023-04-18 ENCOUNTER — Other Ambulatory Visit (HOSPITAL_COMMUNITY): Payer: Self-pay

## 2023-04-19 ENCOUNTER — Other Ambulatory Visit (HOSPITAL_COMMUNITY): Payer: Self-pay

## 2023-04-19 ENCOUNTER — Encounter (HOSPITAL_COMMUNITY): Payer: Self-pay

## 2023-04-27 ENCOUNTER — Other Ambulatory Visit (HOSPITAL_COMMUNITY): Payer: Self-pay

## 2023-04-30 ENCOUNTER — Other Ambulatory Visit (HOSPITAL_COMMUNITY): Payer: Self-pay

## 2023-04-30 ENCOUNTER — Other Ambulatory Visit: Payer: Self-pay | Admitting: Neurology

## 2023-04-30 ENCOUNTER — Other Ambulatory Visit: Payer: Self-pay

## 2023-04-30 DIAGNOSIS — G43019 Migraine without aura, intractable, without status migrainosus: Secondary | ICD-10-CM

## 2023-04-30 DIAGNOSIS — G43409 Hemiplegic migraine, not intractable, without status migrainosus: Secondary | ICD-10-CM

## 2023-05-01 ENCOUNTER — Ambulatory Visit (INDEPENDENT_AMBULATORY_CARE_PROVIDER_SITE_OTHER): Payer: Medicaid Other

## 2023-05-01 VITALS — BP 129/85 | HR 67 | Temp 98.2°F | Resp 18 | Ht 65.5 in | Wt 195.0 lb

## 2023-05-01 DIAGNOSIS — G43019 Migraine without aura, intractable, without status migrainosus: Secondary | ICD-10-CM | POA: Diagnosis not present

## 2023-05-01 MED ORDER — SODIUM CHLORIDE 0.9 % IV SOLN
300.0000 mg | Freq: Once | INTRAVENOUS | Status: AC
  Administered 2023-05-01: 300 mg via INTRAVENOUS
  Filled 2023-05-01: qty 3

## 2023-05-01 NOTE — Progress Notes (Signed)
Diagnosis: Migraines  Provider:  Chilton Greathouse MD  Procedure: IV Infusion  IV Type: Peripheral, IV Location: L Antecubital  Vyepti (Eptinezumab-jjmr), Dose: 300 mg  Infusion Start Time: 1103  Infusion Stop Time: 1135  Post Infusion IV Care: Peripheral IV Discontinued  Discharge: Condition: Good, Destination: Home . AVS Provided  Performed by:  Nat Math, RN

## 2023-05-08 ENCOUNTER — Encounter: Payer: Self-pay | Admitting: Neurology

## 2023-05-09 ENCOUNTER — Encounter: Payer: Medicaid Other | Attending: Physical Medicine & Rehabilitation | Admitting: Physical Medicine & Rehabilitation

## 2023-05-09 ENCOUNTER — Encounter: Payer: Self-pay | Admitting: Physical Medicine & Rehabilitation

## 2023-05-09 VITALS — BP 125/85 | HR 80 | Ht 65.5 in | Wt 196.0 lb

## 2023-05-09 DIAGNOSIS — G43019 Migraine without aura, intractable, without status migrainosus: Secondary | ICD-10-CM | POA: Diagnosis present

## 2023-05-09 DIAGNOSIS — M47812 Spondylosis without myelopathy or radiculopathy, cervical region: Secondary | ICD-10-CM

## 2023-05-09 DIAGNOSIS — M5481 Occipital neuralgia: Secondary | ICD-10-CM | POA: Diagnosis present

## 2023-05-09 NOTE — Patient Instructions (Addendum)
LIDOCAINE PATCHES FOR YOUR RIGHT OCCIPITAL SCALP AND UPPER NECK. APPLY DAILY FOR A WEEK.   ?BIOFREEZE TO AREA   ICE PACK OR WARM COMPRESS/HEATING PAD

## 2023-05-09 NOTE — Progress Notes (Signed)
Subjective:    Patient ID: Patricia [name], female    DOB: Sep 04, 1966, 57 y.o.   MRN: [id number]  HPI  This is initial office visit for Patricia [name] who is here with complaints of chronic neck pain.  Her history is positive for a left MCA infarct in 2017.  She has had a history of migraine headaches since she was 57 years old.   She is followed by Dr. Shon Millet for chronic migraines and right-sided occipital neuralgia.  She is currently on Vyepti 300 mg ,but had not had a dose for almost a year until this past week, as well as Topamax 100 mg at bedtime and tizanidine.  She takes Reyvow for migraine rescue as well as tizanidine.  Because of her ongoing cervicalgia and occipital headaches despite treatment the patient had an MRI of the cervical spine on December 13, 2022.  I reviewed this exam and is notable for disc space narrowing at C3-C4 with associated facet disease causing some mild to moderate foraminal stenosis on the left side.  There is also mild flattening of the cord at this level.  Additionally there is facet disease at C2-C3 and C7-T1. She had an occipital nerve block last year which didn't help. She's had botox remotely.  She had been on higher dose topiramate in the past but could not tolerate due to neuro sedation.  The pain is worst behind her right ear in the occipital area with radiation or involvement of the temple and right eye. The pain is persistent.  The area is tender with touch as well as with any type of movement.  Pain seems to be persistent throughout the day.  It may be occasionally better when she has a chance to rest.  She states the pain is different from her migraine related pain.  Pain Inventory Average Pain 8 Pain Right Now 7 My pain is burning, stabbing, and aching  In the last 24 hours, has pain interfered with the following? General activity  n a  Relation with others  n a Enjoyment of life  n a What TIME of day is your pain at its worst? morning ,  daytime, evening, and night Sleep (in general) Poor  Pain is worse with: some activites Pain improves with: medication and injections Relief from Meds: 0  walk without assistance do you drive?  yes  disabled: date disabled . Hx 3 strokes 2009, 2015, 2017  No problems in this area  Any changes since last visit?  no  [facility]    Family History  Problem Relation Age of Onset   Hypertension Mother    Hypertension Father    Cancer Maternal Aunt    Breast cancer Maternal Aunt    Cancer Maternal Uncle    Social History   Socioeconomic History   Marital status: Single    Spouse name: Not on file   Number of children: 4   Years of education: College    Highest education level: Not on file  Occupational History   Occupation: [name]    Employer: [employer]  Tobacco Use   Smoking status: Never   Smokeless tobacco: Never  Vaping Use   Vaping status: Never Used  Substance and Sexual Activity   Alcohol use: Yes    Alcohol/week: 0.0 standard drinks of alcohol    Comment: occasional   Drug use: No   Sexual activity: Not Currently  Other Topics Concern   Not  on file  Social History Narrative   Patient is single with 4 children.   Patient is right handed.   Patient has college education.   Patient drinks 1 cup daily.   One story home   Social Determinants of Health   Financial Resource Strain: Not on file  Food Insecurity: Not on file  Transportation Needs: Not on file  Physical Activity: Not on file  Stress: Not on file  Social Connections: Not on file   Past Surgical History:  Procedure Laterality Date   ABDOMINAL HYSTERECTOMY     BREAST REDUCTION SURGERY     CARDIAC CATHETERIZATION     CARDIAC SURGERY  Closed hole in heart in 2009   Amplatzer Septal Occluder Ref [id number] (not listed in op notes, document scanned under MRI with written op notes   HYSTEROTOMY     IR ANGIO INTRA EXTRACRAN SEL COM CAROTID INNOMINATE BILAT  MOD SED  04/09/2018   IR ANGIO VERTEBRAL SEL SUBCLAVIAN INNOMINATE UNI R MOD SED  04/09/2018   IR GENERIC HISTORICAL  06/23/2016   IR ANGIO INTRA EXTRACRAN SEL COM CAROTID INNOMINATE BILAT MOD SED 06/23/2016 Julieanne Cotton, MD [facility]   IR GENERIC HISTORICAL  06/23/2016   IR ANGIO VERTEBRAL SEL VERTEBRAL BILAT MOD SED 06/23/2016 Julieanne Cotton, MD [facility]   IR GENERIC HISTORICAL  06/29/2016   IR RADIOLOGIST EVAL & MGMT 06/29/2016 [facility]   IR GENERIC HISTORICAL  07/24/2016   IR INTRA CRAN STENT 07/24/2016 Julieanne Cotton, MD [facility]   IR GENERIC HISTORICAL  07/28/2016   IR PTA INTRACRANIAL 07/28/2016 Julieanne Cotton, MD [facility]   IR GENERIC HISTORICAL  07/28/2016   IR ENDOVASC INTRACRANIAL INF OTHER THAN THROMBO ART INC DIAG ANGIO 07/28/2016 Julieanne Cotton, MD [facility]   IR GENERIC HISTORICAL  08/07/2016   IR RADIOLOGIST EVAL & MGMT 08/07/2016 [facility]   IR GENERIC HISTORICAL  12/05/2016   IR ANGIO INTRA EXTRACRAN SEL COM CAROTID INNOMINATE BILAT MOD SED 12/05/2016 Julieanne Cotton, MD [facility]   IR GENERIC HISTORICAL  12/05/2016   IR ANGIO VERTEBRAL SEL SUBCLAVIAN INNOMINATE UNI R MOD SED 12/05/2016 Julieanne Cotton, MD [facility]   IR GENERIC HISTORICAL  12/05/2016   IR ANGIO VERTEBRAL SEL VERTEBRAL UNI L MOD SED 12/05/2016 Julieanne Cotton, MD [facility]   RADIOLOGY WITH ANESTHESIA [name] 07/24/2016   Procedure: Stenting;  Surgeon: Julieanne Cotton, MD;  [facility];  Service: Radiology;  Laterality: [name];   RADIOLOGY WITH ANESTHESIA [name] 07/28/2016   Procedure: RADIOLOGY WITH ANESTHESIA;  Surgeon: Medication Radiologist, MD;  Location: [facility];  Service: Radiology;  Laterality: [name];   REDUCTION MAMMAPLASTY Bilateral 2000   SHOULDER ARTHROSCOPY WITH OPEN ROTATOR CUFF REPAIR AND DISTAL CLAVICLE ACROMINECTOMY Right 02/04/2015   Procedure: SHOULDER ARTHROSCOPY WITH SUBACROMIAL DECOMPRESSION AND OPEN ROTATOR CUFF REPAIR,;  Surgeon: Cammy Copa, MD;  Location: [facility];  Service: Orthopedics;  Laterality: Right;  RIGHT SHOULDER DOA,SAD,MINI-OPEN ROTATOR CUFF TEAR REPAIR.   Past Medical History:  Diagnosis Date   Anemia    in the past   Asthma    " a long time ago"   Bursitis    Depression    13 years ago -    Diabetes mellitus without complication (HCC)    type  2   GERD (gastroesophageal reflux disease)    occ. acid reflux   H/O: hysterectomy    Headache    migraines   Hypertension    Kidney stones  PFO (patent foramen ovale)    hx of PFO with surgery done as an adult   Rotator cuff tear    right   Stroke [facility]) 2009   lasdt stroke July 2015   Syncope and collapse    BP 125/85   Pulse 80   Ht 5' 5.5" (1.664 m)   Wt 196 lb (88.9 kg)   SpO2 97%   BMI 32.12 kg/m   Opioid Risk Score:   Fall Risk Score:  `1  Depression screen [facility] 2/9     05/09/2023    1:19 PM  Depression screen PHQ 2/9  Decreased Interest 2  Down, Depressed, Hopeless 2  PHQ - 2 Score 4  Altered sleeping 2  Tired, decreased energy 1  Change in appetite 2  Feeling bad or failure about yourself  2  Trouble concentrating 2  Moving slowly or fidgety/restless 1  Suicidal thoughts 0  PHQ-9 Score 14  Difficult doing work/chores Somewhat difficult     Review of Systems  Constitutional: Negative.   HENT: Negative.    Eyes: Negative.   Respiratory: Negative.    Cardiovascular: Negative.   Gastrointestinal: Negative.   Endocrine: Negative.   Genitourinary: Negative.   Musculoskeletal: Negative.   Skin: Negative.   Allergic/Immunologic: Negative.   Neurological:  Positive for headaches.       Base of head right side  Hematological:  Bruises/bleeds easily.       Brillinta  Psychiatric/Behavioral:  Positive for dysphoric mood.   All other systems reviewed and are negative.      Objective:   Physical Exam Gen: no distress, normal appearing HEENT: oral mucosa pink and moist, NCAT Cardio: Reg rate Chest: normal  effort, normal rate of breathing Abd: soft, non-distended Ext: no edema Psych: Patient is pleasant but a bit flat.  She is cooperative. Skin: intact Neuro: Alert and oriented x 3. Normal insight and awareness. Intact Memory. Normal language and speech. Cranial nerve exam unremarkable. MMT: 5/5 UE and LE. No focal sensory deficits except for finger and toe tips. DTR's 1+. .   Musculoskeletal: Patient with a head forward posture.  Shoulders curved anteriorly.  She is tender and taut throughout the trapezius muscles and upper cervical paraspinal musculature.  Both pectoralis muscles are tight as well on either side.  She is tender with palpation of her occipital skull as well as behind the ear and almost frankly allodynic.  Flexion increased pain as did rotation to the left and extension.  Pain was exclusively on the right side.       Assessment & Plan:   Right sided cervicalgia/occipital tenderness. Has DDD at C3-4 with left foraminal stenosis on MRI as well as multi-level facet arthropathy. If this referred pain from her neck, the C2-3 facets on right might be the most likely source.  I do not believe the left-sided foraminal stenosis is contributing to her pain as her symptoms are on the right side. Chronic migraines Hx of left MCA infarct ?  Depression/anxiety.  Her neck and head are so sensitive that I feel that there almost is a central component to this pain.   Plan: Made referral to outpatient physical therapy at [facility] to address cervical and shoulder girdle range of motion and posture.  We need to decrease her tension at these levels.  I also want them to address cervical range of motion and lengthening with a trial of modalities and cervical traction. Suggested some over-the-counter remedies for her topical pain including  IcyHot patch with lidocaine, Tiger balm, Biofreeze. Hesitant to add further medication to her regimen as it is fairly lengthy already.  She cannot take NSAIDs  because of her stroke and the fact that she is on Brilinta.

## 2023-05-23 ENCOUNTER — Ambulatory Visit: Payer: Medicaid Other | Admitting: Physical Medicine & Rehabilitation

## 2023-05-23 ENCOUNTER — Ambulatory Visit: Payer: Medicaid Other | Admitting: Physical Therapy

## 2023-05-29 ENCOUNTER — Other Ambulatory Visit: Payer: Self-pay

## 2023-05-29 ENCOUNTER — Other Ambulatory Visit (HOSPITAL_COMMUNITY): Payer: Self-pay

## 2023-05-29 ENCOUNTER — Telehealth: Payer: Self-pay | Admitting: Neurology

## 2023-05-29 MED ORDER — LASMIDITAN SUCCINATE 100 MG PO TABS: 1.0000 | ORAL_TABLET | ORAL | 0 refills | Status: DC | PRN

## 2023-05-29 NOTE — Telephone Encounter (Signed)
RX Sent to [facility] pool

## 2023-05-29 NOTE — Telephone Encounter (Signed)
Pt called stating she can not get her mail prescription order authorized. She asking for Reyvow samples in office or an order sent to her local pharmacy. Stated she has had a headache for over a week now and has been vomiting this morning from headache. Local pharmacy is [facility] on [address]

## 2023-05-29 NOTE — Telephone Encounter (Deleted)
Pt called stating she can not get her mail prescription order authorized. She asking for Reyvow samples in office or an order sent to her local pharmacy. Stated she has had a headache for over a week now and has been vomiting this morning from headache. Local pharmacy is [facility] on [address]

## 2023-05-30 ENCOUNTER — Telehealth: Payer: Self-pay

## 2023-05-30 ENCOUNTER — Other Ambulatory Visit (HOSPITAL_COMMUNITY): Payer: Self-pay

## 2023-05-30 ENCOUNTER — Encounter: Payer: Self-pay | Admitting: Neurology

## 2023-05-30 ENCOUNTER — Other Ambulatory Visit: Payer: Self-pay

## 2023-05-30 ENCOUNTER — Other Ambulatory Visit: Payer: Self-pay | Admitting: Family Medicine

## 2023-05-30 DIAGNOSIS — Z1231 Encounter for screening mammogram for malignant neoplasm of breast: Secondary | ICD-10-CM

## 2023-05-30 MED ORDER — REYVOW 100 MG PO TABS: 100.0000 mg | ORAL_TABLET | Freq: Every day | ORAL | 0 refills | Status: DC | PRN

## 2023-05-30 MED ORDER — REYVOW 100 MG PO TABS
100.0000 mg | ORAL_TABLET | Freq: Every day | ORAL | 0 refills | Status: DC | PRN
  Filled 2023-05-30 – 2023-06-27 (×2): qty 16, 16d supply, fill #0

## 2023-05-30 NOTE — Telephone Encounter (Signed)
PT CALLED AND NO SCRIPT AT PHARMACY. RESENT TO DR. POOL

## 2023-06-01 ENCOUNTER — Other Ambulatory Visit (HOSPITAL_COMMUNITY): Payer: Self-pay

## 2023-06-01 ENCOUNTER — Telehealth: Payer: Self-pay | Admitting: Pharmacy Technician

## 2023-06-01 ENCOUNTER — Encounter: Payer: Self-pay | Admitting: Neurology

## 2023-06-01 NOTE — Telephone Encounter (Signed)
Pharmacy Patient Advocate Encounter   Received notification from CoverMyMeds that prior authorization for REYVOW 100MG  is required/requested.   Insurance verification completed.   The patient is insured through [facility] .   Per test claim: PA required; PA submitted to [facility] via CoverMyMeds Key/confirmation #/EOC [id number] Status is pending

## 2023-06-01 NOTE — Telephone Encounter (Signed)
Pharmacy Patient Advocate Encounter  Received notification from [facility] that Prior Authorization for REYVOW 100MG  has been APPROVED from 8.9.24 to 8.9.25. PLEASE ALLOW 24-72 HOURS TO FILL AT PHARMACY LEVEL   PA #/Case ID/Reference #: [id number]

## 2023-06-04 ENCOUNTER — Other Ambulatory Visit (HOSPITAL_COMMUNITY): Payer: Self-pay

## 2023-06-06 ENCOUNTER — Other Ambulatory Visit (HOSPITAL_COMMUNITY): Payer: Self-pay

## 2023-06-08 ENCOUNTER — Ambulatory Visit: Admission: RE | Admit: 2023-06-08 | Payer: Medicaid Other | Source: Ambulatory Visit

## 2023-06-08 DIAGNOSIS — Z1231 Encounter for screening mammogram for malignant neoplasm of breast: Secondary | ICD-10-CM

## 2023-06-09 ENCOUNTER — Ambulatory Visit: Payer: Medicaid Other

## 2023-06-12 ENCOUNTER — Other Ambulatory Visit (HOSPITAL_COMMUNITY): Payer: Self-pay

## 2023-06-14 ENCOUNTER — Other Ambulatory Visit (HOSPITAL_COMMUNITY): Payer: Self-pay

## 2023-06-14 NOTE — Therapy (Signed)
OUTPATIENT PHYSICAL THERAPY CERVICAL EVALUATION   Patient Name: Patricia [name] MRN: [id number] DOB:[birth year], 57 y.o., female Today's Date: 06/15/2023  END OF SESSION:  PT End of Session - 06/15/23 1031     Visit Number 1    Number of Visits 6    Date for PT Re-Evaluation 08/15/23    Authorization Type MCD    PT Start Time 0750    PT Stop Time 0830    PT Time Calculation (min) 40 min    Activity Tolerance Patient limited by pain    Behavior During Therapy WFL for tasks assessed/performed             Past Medical History:  Diagnosis Date   Anemia    in the past   Asthma    " a long time ago"   Bursitis    Depression    13 years ago -    Diabetes mellitus without complication (HCC)    type  2   GERD (gastroesophageal reflux disease)    occ. acid reflux   H/O: hysterectomy    Headache    migraines   Hypertension    Kidney stones    PFO (patent foramen ovale)    hx of PFO with surgery done as an adult   Rotator cuff tear    right   Stroke [facility]) 2009   lasdt stroke July 2015   Syncope and collapse    Past Surgical History:  Procedure Laterality Date   ABDOMINAL HYSTERECTOMY     BREAST REDUCTION SURGERY     CARDIAC CATHETERIZATION     CARDIAC SURGERY  Closed hole in heart in 2009   Amplatzer Septal Occluder Ref 9-ASD-010 (not listed in op notes, document scanned under MRI with written op notes   HYSTEROTOMY     IR ANGIO INTRA EXTRACRAN SEL COM CAROTID INNOMINATE BILAT MOD SED  04/09/2018   IR ANGIO VERTEBRAL SEL SUBCLAVIAN INNOMINATE UNI R MOD SED  04/09/2018   IR GENERIC HISTORICAL  06/23/2016   IR ANGIO INTRA EXTRACRAN SEL COM CAROTID INNOMINATE BILAT MOD SED 06/23/2016 Julieanne Cotton, MD [facility]   IR GENERIC HISTORICAL  06/23/2016   IR ANGIO VERTEBRAL SEL VERTEBRAL BILAT MOD SED 06/23/2016 Julieanne Cotton, MD [facility]   IR GENERIC HISTORICAL  06/29/2016   IR RADIOLOGIST EVAL & MGMT 06/29/2016 [facility]   IR GENERIC HISTORICAL   07/24/2016   IR INTRA CRAN STENT 07/24/2016 Julieanne Cotton, MD [facility]   IR GENERIC HISTORICAL  07/28/2016   IR PTA INTRACRANIAL 07/28/2016 Julieanne Cotton, MD [facility]   IR GENERIC HISTORICAL  07/28/2016   IR ENDOVASC INTRACRANIAL INF OTHER THAN THROMBO ART INC DIAG ANGIO 07/28/2016 Julieanne Cotton, MD [facility]   IR GENERIC HISTORICAL  08/07/2016   IR RADIOLOGIST EVAL & MGMT 08/07/2016 [facility]   IR GENERIC HISTORICAL  12/05/2016   IR ANGIO INTRA EXTRACRAN SEL COM CAROTID INNOMINATE BILAT MOD SED 12/05/2016 Julieanne Cotton, MD [facility]   IR GENERIC HISTORICAL  12/05/2016   IR ANGIO VERTEBRAL SEL SUBCLAVIAN INNOMINATE UNI R MOD SED 12/05/2016 Julieanne Cotton, MD [facility]   IR GENERIC HISTORICAL  12/05/2016   IR ANGIO VERTEBRAL SEL VERTEBRAL UNI L MOD SED 12/05/2016 Julieanne Cotton, MD [facility]   RADIOLOGY WITH ANESTHESIA [name] 07/24/2016   Procedure: Stenting;  Surgeon: Julieanne Cotton, MD;  [facility];  Service: Radiology;  Laterality: [name];   RADIOLOGY WITH ANESTHESIA [name] 07/28/2016   Procedure: RADIOLOGY WITH ANESTHESIA;  Surgeon: Medication Radiologist, MD;  [facility]: [facility];  Service: Radiology;  Laterality: [name];   REDUCTION MAMMAPLASTY Bilateral 2000   SHOULDER ARTHROSCOPY WITH OPEN ROTATOR CUFF REPAIR AND DISTAL CLAVICLE ACROMINECTOMY Right 02/04/2015   Procedure: SHOULDER ARTHROSCOPY WITH SUBACROMIAL DECOMPRESSION AND OPEN ROTATOR CUFF REPAIR,;  Surgeon: Cammy Copa, MD;  [facility];  Service: Orthopedics;  Laterality: Right;  RIGHT SHOULDER DOA,SAD,MINI-OPEN ROTATOR CUFF TEAR REPAIR.   Patient Active Problem List   Diagnosis Date Noted   Facet arthropathy, cervical 05/09/2023   Intractable migraine without aura and without status migrainosus 05/17/2022   Renal insufficiency 03/10/2021   Chronic daily headache 06/19/2017   Intractable chronic migraine without aura and with status migrainosus 09/15/2016   Depression  08/23/2016   Cerebrovascular accident (CVA) due to occlusion of left middle cerebral artery (HCC) 07/28/2016   Acute respiratory failure (HCC)    Middle cerebral artery stenosis, left 07/24/2016   Headache 03/23/2016   Type 2 diabetes mellitus with complication, with long-term current use of insulin (HCC) 09/08/2015   Hyperglycemia    TIA (transient ischemic attack)    AKI (acute kidney injury) (HCC) 02/20/2015   ARF (acute renal failure) (HCC) 02/20/2015   Rotator cuff tear 02/04/2015   Occipital neuralgia 08/27/2014   Type 2 diabetes mellitus with complication (HCC) 08/27/2014   PFO (patent foramen ovale) 06/17/2014   Hyperlipidemia 06/17/2014   Morbid obesity, unspecified obesity type (HCC) 06/17/2014   Acute CVA (cerebrovascular accident) (HCC) 05/22/2014   Non-compliance 05/22/2014   History of cardioembolic stroke 05/22/2014   Status post device closure of ASD 05/22/2014   HTN (hypertension), benign 05/22/2014   DM type 2 (diabetes mellitus, type 2) (HCC) 05/22/2014    PCP: Salli Real, MD   REFERRING PROVIDER: Ranelle Oyster, MD  REFERRING DIAG: M54.81 (ICD-10-CM) - Bilateral occipital neuralgia M47.812 (ICD-10-CM) - Facet arthropathy, cervical  THERAPY DIAG:  Cervicalgia - Plan: PT plan of care cert/re-cert  Muscle weakness (generalized) - Plan: PT plan of care cert/re-cert  Rationale for Evaluation and Treatment: Rehabilitation  ONSET DATE: chronic  SUBJECTIVE:                                                                                                                                                                                                         SUBJECTIVE STATEMENT: Relates a history of neck pain and migraine HA's over many years.  Has had trigger point and botox injection, medication trials but no PT to date.  Unable to describe distinct aggravating or relieving factors to neck pain. Hand dominance:  Right  PERTINENT HISTORY:  Right sided  cervicalgia/occipital tenderness. Has DDD at C3-4 with left foraminal stenosis on MRI as well as multi-level facet arthropathy. If this referred pain from her neck, the C2-3 facets on right might be the most likely source.  I do not believe the left-sided foraminal stenosis is contributing to her pain as her symptoms are on the right side. Chronic migraines Hx of left MCA infarct ?  Depression/anxiety.  Her neck and head are so sensitive that I feel that there almost is a central component to this pain.  PAIN:  Are you having pain? Yes: NPRS scale: 10/10 Pain location: head and neck Pain description: HA and neck pain Aggravating factors: scents, weather Relieving factors: undetermined  PRECAUTIONS: None  RED FLAGS: None     WEIGHT BEARING RESTRICTIONS: No  FALLS:  Has patient fallen in last 6 months? No  OCCUPATION: medical assistant  PLOF: Independent  PATIENT GOALS: To reduce and manage my neck symptoms  NEXT MD VISIT: September  OBJECTIVE:   DIAGNOSTIC FINDINGS:  Salli Real, MD   PATIENT SURVEYS:  FOTO 42(54 predicted)  POSTURE: rounded shoulders and depressed shoulders R>L  PALPATION: Globally tender to B upper traps, scalenes, levators and cervical paracervicals.   CERVICAL ROM:   Active ROM A/PROM (deg) eval  Flexion 75%P!  Extension 50%P!  Right lateral flexion 25%P!  Left lateral flexion 50%P!  Right rotation 50%P!  Left rotation 50%P!   (Blank rows = not tested)  UPPER EXTREMITY ROM: WFL but painful  Active ROM Right eval Left eval  Shoulder flexion    Shoulder extension    Shoulder abduction    Shoulder adduction    Shoulder extension    Shoulder internal rotation    Shoulder external rotation    Elbow flexion    Elbow extension    Wrist flexion    Wrist extension    Wrist ulnar deviation    Wrist radial deviation    Wrist pronation    Wrist supination     (Blank rows = not tested)  UPPER EXTREMITY MMT: deficits related to  pain  MMT Right eval Left eval  Shoulder flexion 4- 4-  Shoulder extension 4- 4-  Shoulder abduction 4- 4-  Shoulder adduction    Shoulder extension    Shoulder internal rotation    Shoulder external rotation    Middle trapezius    Lower trapezius    Elbow flexion    Elbow extension    Wrist flexion    Wrist extension    Wrist ulnar deviation    Wrist radial deviation    Wrist pronation    Wrist supination    Grip strength     (Blank rows = not tested)  CERVICAL SPECIAL TESTS:  Neck flexor muscle endurance test: Positive 17s  FUNCTIONAL TESTS:  5 times sit to stand: 21s arms crossed  TODAY'S TREATMENT:  DATE: 06/15/23 Eval   PATIENT EDUCATION:  Education details: Discussed eval findings, rehab rationale and POC and patient is in agreement  Person educated: Patient Education method: Explanation Education comprehension: verbalized understanding and needs further education  HOME EXERCISE PROGRAM: Access Code: [email or web address] URL: [email or web address] Date: 06/15/2023 Prepared by: Gustavus Bryant  Program Notes Just hold position for 5 min  Exercises - Supine Cervical Retraction with Towel  - 2-3 x daily - 5 x weekly - 1 sets -  5 min hold  ASSESSMENT:  CLINICAL IMPRESSION: Patient is a 57 y.o. female who was seen today for physical therapy evaluation and treatment for chronic neck pain and migraines.  BUE AROM functional but does elicit pain through cervical and upper thoracic regions.  BUE strength grades diminished due to pain levels.  Gobal tenderness and irritation noted throughout cervical and crevicothoracic regions.  No distinct pain patterns noted with AROM as all ranges limited by pain and soft tissue irritation and guarding.  Prognosis is guarded due to chronicity, history of migraines, underlying degenerative changes as well  as CVA x3.  OBJECTIVE IMPAIRMENTS: decreased activity tolerance, decreased knowledge of condition, decreased ROM, decreased strength, increased fascial restrictions, increased muscle spasms, impaired flexibility, impaired UE functional use, postural dysfunction, obesity, and pain.   ACTIVITY LIMITATIONS: carrying, lifting, reach over head, and hygiene/grooming  PERSONAL FACTORS: Behavior pattern, Past/current experiences, Time since onset of injury/illness/exacerbation, and 1 comorbidity: prior CVAx3  are also affecting patient's functional outcome.   REHAB POTENTIAL: Fair based on previous unsuccessful conservative treatments  CLINICAL DECISION MAKING: Evolving/moderate complexity  EVALUATION COMPLEXITY: Moderate   GOALS: Goals reviewed with patient? No  SHORT TERM GOALS: Target date: 07/06/2023  Patient to demonstrate independence in HEP  Baseline: [email or web address] Goal status: INITIAL   LONG TERM GOALS: Target date: 07/27/2023    Increase FOTO score to 54 Baseline: 42 Goal status: INITIAL  2.  Increase all AROM c-spine to 75% Baseline:  Active ROM A/PROM (deg) eval  Flexion 75%P!  Extension 50%P!  Right lateral flexion 25%P!  Left lateral flexion 50%P!  Right rotation 50%P!  Left rotation 50%P!   Goal status: INITIAL  3.  Decrease worst pain to 6/10 Baseline: 10/10 Goal status: INITIAL  4.  Increase BUE Strength to 4/5 Baseline:  MMT Right eval Left eval  Shoulder flexion 4- 4-  Shoulder extension 4- 4-  Shoulder abduction 4- 4-   Goal status: INITIAL  5.  Increase cervical deep flexor endurance to 25s Baseline: 17s Goal status: INITIAL   PLAN:  PT FREQUENCY: 1-2x/week  PT DURATION: 6 weeks  PLANNED INTERVENTIONS: Therapeutic exercises, Therapeutic activity, Neuromuscular re-education, Balance training, Gait training, Patient/Family education, Self Care, Joint mobilization, DME instructions, Dry Needling, Electrical stimulation, Spinal mobilization,  Cryotherapy, Moist heat, Traction, Manual therapy, and Re-evaluation  PLAN FOR NEXT SESSION: HEP review and update, manual techniques as appropriate, aerobic tasks, ROM and flexibility activities, strengthening and PREs, TPDN, gait and balance training as needed     Hildred Laser, PT 06/15/2023, 11:03 AM  Check all possible CPT codes: 16109 - PT Re-evaluation, 97110- Therapeutic Exercise, [phone]- Neuro Re-education, [phone] - Gait Training, [phone] - Manual Therapy, 97530 - Therapeutic Activities, 97535 - Self Care, [phone] - Mechanical traction, and [id number] - Electrical stimulation (unattended)    Check all conditions that are expected to impact treatment: {Conditions expected to impact treatment:Contractures, spasticity or fracture relevant to requested treatment and Neurological condition and/or seizures   If treatment provided at initial evaluation, no treatment charged  due to lack of authorization.

## 2023-06-15 ENCOUNTER — Ambulatory Visit: Payer: Medicaid Other | Attending: Physical Medicine & Rehabilitation

## 2023-06-15 ENCOUNTER — Other Ambulatory Visit (HOSPITAL_COMMUNITY): Payer: Self-pay

## 2023-06-15 DIAGNOSIS — M47812 Spondylosis without myelopathy or radiculopathy, cervical region: Secondary | ICD-10-CM | POA: Insufficient documentation

## 2023-06-15 DIAGNOSIS — M6281 Muscle weakness (generalized): Secondary | ICD-10-CM | POA: Diagnosis present

## 2023-06-15 DIAGNOSIS — M5481 Occipital neuralgia: Secondary | ICD-10-CM | POA: Insufficient documentation

## 2023-06-15 DIAGNOSIS — M542 Cervicalgia: Secondary | ICD-10-CM | POA: Diagnosis not present

## 2023-06-27 ENCOUNTER — Other Ambulatory Visit (HOSPITAL_COMMUNITY): Payer: Self-pay

## 2023-07-01 NOTE — Therapy (Deleted)
OUTPATIENT PHYSICAL THERAPY CERVICAL EVALUATION   Patient Name: Patricia [name] MRN: [id number] DOB:1966-04-13, 57 y.o., female Today's Date: 07/01/2023  END OF SESSION:    Past Medical History:  Diagnosis Date   Anemia    in the past   Asthma    " a long time ago"   Bursitis    Depression    13 years ago -    Diabetes mellitus without complication (HCC)    type  2   GERD (gastroesophageal reflux disease)    occ. acid reflux   H/O: hysterectomy    Headache    migraines   Hypertension    Kidney stones    PFO (patent foramen ovale)    hx of PFO with surgery done as an adult   Rotator cuff tear    right   Stroke [facility]) 2009   lasdt stroke July 2015   Syncope and collapse    Past Surgical History:  Procedure Laterality Date   ABDOMINAL HYSTERECTOMY     BREAST REDUCTION SURGERY     CARDIAC CATHETERIZATION     CARDIAC SURGERY  Closed hole in heart in 2009   Amplatzer Septal Occluder Ref [id number] (not listed in op notes, document scanned under MRI with written op notes   HYSTEROTOMY     IR ANGIO INTRA EXTRACRAN SEL COM CAROTID INNOMINATE BILAT MOD SED  04/09/2018   IR ANGIO VERTEBRAL SEL SUBCLAVIAN INNOMINATE UNI R MOD SED  04/09/2018   IR GENERIC HISTORICAL  06/23/2016   IR ANGIO INTRA EXTRACRAN SEL COM CAROTID INNOMINATE BILAT MOD SED 06/23/2016 Julieanne Cotton, MD [facility]   IR GENERIC HISTORICAL  06/23/2016   IR ANGIO VERTEBRAL SEL VERTEBRAL BILAT MOD SED 06/23/2016 Julieanne Cotton, MD [facility]   IR GENERIC HISTORICAL  06/29/2016   IR RADIOLOGIST EVAL & MGMT 06/29/2016 [facility]   IR GENERIC HISTORICAL  07/24/2016   IR INTRA CRAN STENT 07/24/2016 Julieanne Cotton, MD [facility]   IR GENERIC HISTORICAL  07/28/2016   IR PTA INTRACRANIAL 07/28/2016 Julieanne Cotton, MD [facility]   IR GENERIC HISTORICAL  07/28/2016   IR ENDOVASC INTRACRANIAL INF OTHER THAN THROMBO ART INC DIAG ANGIO 07/28/2016 Julieanne Cotton, MD [facility]   IR GENERIC  HISTORICAL  08/07/2016   IR RADIOLOGIST EVAL & MGMT 08/07/2016 [facility]   IR GENERIC HISTORICAL  12/05/2016   IR ANGIO INTRA EXTRACRAN SEL COM CAROTID INNOMINATE BILAT MOD SED 12/05/2016 Julieanne Cotton, MD [facility]   IR GENERIC HISTORICAL  12/05/2016   IR ANGIO VERTEBRAL SEL SUBCLAVIAN INNOMINATE UNI R MOD SED 12/05/2016 Julieanne Cotton, MD [facility]   IR GENERIC HISTORICAL  12/05/2016   IR ANGIO VERTEBRAL SEL VERTEBRAL UNI L MOD SED 12/05/2016 Julieanne Cotton, MD [facility]   RADIOLOGY WITH ANESTHESIA [name] 07/24/2016   Procedure: Stenting;  Surgeon: Julieanne Cotton, MD;  [facility]: [facility];  Service: Radiology;  Laterality: [name];   RADIOLOGY WITH ANESTHESIA [name] 07/28/2016   Procedure: RADIOLOGY WITH ANESTHESIA;  Surgeon: Medication Radiologist, MD;  Location: [facility];  Service: Radiology;  Laterality: [name];   REDUCTION MAMMAPLASTY Bilateral 2000   SHOULDER ARTHROSCOPY WITH OPEN ROTATOR CUFF REPAIR AND DISTAL CLAVICLE ACROMINECTOMY Right 02/04/2015   Procedure: SHOULDER ARTHROSCOPY WITH SUBACROMIAL DECOMPRESSION AND OPEN ROTATOR CUFF REPAIR,;  Surgeon: Cammy Copa, MD;  Location: [facility];  Service: Orthopedics;  Laterality: Right;  RIGHT SHOULDER DOA,SAD,MINI-OPEN ROTATOR CUFF TEAR REPAIR.   Patient Active Problem List   Diagnosis Date Noted   Facet  arthropathy, cervical 05/09/2023   Intractable migraine without aura and without status migrainosus 05/17/2022   Renal insufficiency 03/10/2021   Chronic daily headache 06/19/2017   Intractable chronic migraine without aura and with status migrainosus 09/15/2016   Depression 08/23/2016   Cerebrovascular accident (CVA) due to occlusion of left middle cerebral artery (HCC) 07/28/2016   Acute respiratory failure (HCC)    Middle cerebral artery stenosis, left 07/24/2016   Headache 03/23/2016   Type 2 diabetes mellitus with complication, with long-term current use of insulin (HCC) 09/08/2015   Hyperglycemia    TIA (transient  ischemic attack)    AKI (acute kidney injury) (HCC) 02/20/2015   ARF (acute renal failure) (HCC) 02/20/2015   Rotator cuff tear 02/04/2015   Occipital neuralgia 08/27/2014   Type 2 diabetes mellitus with complication (HCC) 08/27/2014   PFO (patent foramen ovale) 06/17/2014   Hyperlipidemia 06/17/2014   Morbid obesity, unspecified obesity type (HCC) 06/17/2014   Acute CVA (cerebrovascular accident) (HCC) 05/22/2014   Non-compliance 05/22/2014   History of cardioembolic stroke 05/22/2014   Status post device closure of ASD 05/22/2014   HTN (hypertension), benign 05/22/2014   DM type 2 (diabetes mellitus, type 2) (HCC) 05/22/2014    PCP: Salli Real, MD   REFERRING PROVIDER: Ranelle Oyster, MD  REFERRING DIAG: M54.81 (ICD-10-CM) - Bilateral occipital neuralgia M47.812 (ICD-10-CM) - Facet arthropathy, cervical  THERAPY DIAG:  No diagnosis found.  Rationale for Evaluation and Treatment: Rehabilitation  ONSET DATE: chronic  SUBJECTIVE:                                                                                                                                                                                                         SUBJECTIVE STATEMENT: Relates a history of neck pain and migraine HA's over many years.  Has had trigger point and botox injection, medication trials but no PT to date.  Unable to describe distinct aggravating or relieving factors to neck pain. Hand dominance: Right  PERTINENT HISTORY:  Right sided cervicalgia/occipital tenderness. Has DDD at C3-4 with left foraminal stenosis on MRI as well as multi-level facet arthropathy. If this referred pain from her neck, the C2-3 facets on right might be the most likely source.  I do not believe the left-sided foraminal stenosis is contributing to her pain as her symptoms are on the right side. Chronic migraines Hx of left MCA infarct ?  Depression/anxiety.  Her neck and head are so sensitive that I feel that there  almost is a central component to this pain.  PAIN:  Are you  having pain? Yes: NPRS scale: 10/10 Pain location: head and neck Pain description: HA and neck pain Aggravating factors: scents, weather Relieving factors: undetermined  PRECAUTIONS: None  RED FLAGS: None     WEIGHT BEARING RESTRICTIONS: No  FALLS:  Has patient fallen in last 6 months? No  OCCUPATION: medical assistant  PLOF: Independent  PATIENT GOALS: To reduce and manage my neck symptoms  NEXT MD VISIT: September  OBJECTIVE:   DIAGNOSTIC FINDINGS:  Salli Real, MD   PATIENT SURVEYS:  FOTO 42(54 predicted)  POSTURE: rounded shoulders and depressed shoulders R>L  PALPATION: Globally tender to B upper traps, scalenes, levators and cervical paracervicals.   CERVICAL ROM:   Active ROM A/PROM (deg) eval  Flexion 75%P!  Extension 50%P!  Right lateral flexion 25%P!  Left lateral flexion 50%P!  Right rotation 50%P!  Left rotation 50%P!   (Blank rows = not tested)  UPPER EXTREMITY ROM: WFL but painful  Active ROM Right eval Left eval  Shoulder flexion    Shoulder extension    Shoulder abduction    Shoulder adduction    Shoulder extension    Shoulder internal rotation    Shoulder external rotation    Elbow flexion    Elbow extension    Wrist flexion    Wrist extension    Wrist ulnar deviation    Wrist radial deviation    Wrist pronation    Wrist supination     (Blank rows = not tested)  UPPER EXTREMITY MMT: deficits related to pain  MMT Right eval Left eval  Shoulder flexion 4- 4-  Shoulder extension 4- 4-  Shoulder abduction 4- 4-  Shoulder adduction    Shoulder extension    Shoulder internal rotation    Shoulder external rotation    Middle trapezius    Lower trapezius    Elbow flexion    Elbow extension    Wrist flexion    Wrist extension    Wrist ulnar deviation    Wrist radial deviation    Wrist pronation    Wrist supination    Grip strength     (Blank rows = not  tested)  CERVICAL SPECIAL TESTS:  Neck flexor muscle endurance test: Positive 17s  FUNCTIONAL TESTS:  5 times sit to stand: 21s arms crossed  TODAY'S TREATMENT:                                                                                                                              DATE: 06/15/23 Eval   PATIENT EDUCATION:  Education details: Discussed eval findings, rehab rationale and POC and patient is in agreement  Person educated: Patient Education method: Explanation Education comprehension: verbalized understanding and needs further education  HOME EXERCISE PROGRAM: Access Code: [email or web address] URL: [email or web address] Date: 06/15/2023 Prepared by: Gustavus Bryant  Program Notes Just hold position for 5 min  Exercises - Supine Cervical Retraction with Towel  - 2-3 x daily - 5 x weekly -  1 sets -  5 min hold  ASSESSMENT:  CLINICAL IMPRESSION: Patient is a 57 y.o. female who was seen today for physical therapy evaluation and treatment for chronic neck pain and migraines.  BUE AROM functional but does elicit pain through cervical and upper thoracic regions.  BUE strength grades diminished due to pain levels.  Gobal tenderness and irritation noted throughout cervical and crevicothoracic regions.  No distinct pain patterns noted with AROM as all ranges limited by pain and soft tissue irritation and guarding.  Prognosis is guarded due to chronicity, history of migraines, underlying degenerative changes as well as CVA x3.  OBJECTIVE IMPAIRMENTS: decreased activity tolerance, decreased knowledge of condition, decreased ROM, decreased strength, increased fascial restrictions, increased muscle spasms, impaired flexibility, impaired UE functional use, postural dysfunction, obesity, and pain.   ACTIVITY LIMITATIONS: carrying, lifting, reach over head, and hygiene/grooming  PERSONAL FACTORS: Behavior pattern, Past/current experiences, Time since onset of  injury/illness/exacerbation, and 1 comorbidity: prior CVAx3  are also affecting patient's functional outcome.   REHAB POTENTIAL: Fair based on previous unsuccessful conservative treatments  CLINICAL DECISION MAKING: Evolving/moderate complexity  EVALUATION COMPLEXITY: Moderate   GOALS: Goals reviewed with patient? No  SHORT TERM GOALS: Target date: 07/06/2023  Patient to demonstrate independence in HEP  Baseline: [email or web address] Goal status: INITIAL   LONG TERM GOALS: Target date: 07/27/2023    Increase FOTO score to 54 Baseline: 42 Goal status: INITIAL  2.  Increase all AROM c-spine to 75% Baseline:  Active ROM A/PROM (deg) eval  Flexion 75%P!  Extension 50%P!  Right lateral flexion 25%P!  Left lateral flexion 50%P!  Right rotation 50%P!  Left rotation 50%P!   Goal status: INITIAL  3.  Decrease worst pain to 6/10 Baseline: 10/10 Goal status: INITIAL  4.  Increase BUE Strength to 4/5 Baseline:  MMT Right eval Left eval  Shoulder flexion 4- 4-  Shoulder extension 4- 4-  Shoulder abduction 4- 4-   Goal status: INITIAL  5.  Increase cervical deep flexor endurance to 25s Baseline: 17s Goal status: INITIAL   PLAN:  PT FREQUENCY: 1-2x/week  PT DURATION: 6 weeks  PLANNED INTERVENTIONS: Therapeutic exercises, Therapeutic activity, Neuromuscular re-education, Balance training, Gait training, Patient/Family education, Self Care, Joint mobilization, DME instructions, Dry Needling, Electrical stimulation, Spinal mobilization, Cryotherapy, Moist heat, Traction, Manual therapy, and Re-evaluation  PLAN FOR NEXT SESSION: HEP review and update, manual techniques as appropriate, aerobic tasks, ROM and flexibility activities, strengthening and PREs, TPDN, gait and balance training as needed     Hildred Laser, PT 07/01/2023, 4:24 PM  Check all possible CPT codes: 81191 - PT Re-evaluation, 97110- Therapeutic Exercise, [phone]- Neuro Re-education, [phone] - Gait Training, [phone]  - Manual Therapy, 97530 - Therapeutic Activities, 97535 - Self Care, [phone] - Mechanical traction, and 97014 - Electrical stimulation (unattended)    Check all conditions that are expected to impact treatment: {Conditions expected to impact treatment:Contractures, spasticity or fracture relevant to requested treatment and Neurological condition and/or seizures   If treatment provided at initial evaluation, no treatment charged due to lack of authorization.

## 2023-07-02 ENCOUNTER — Ambulatory Visit: Payer: Medicaid Other

## 2023-07-02 NOTE — Therapy (Deleted)
OUTPATIENT PHYSICAL THERAPY CERVICAL EVALUATION   Patient Name: Patricia [name] MRN: [id number] DOB:Sep 10, 1966, 57 y.o., female Today's Date: 07/02/2023  END OF SESSION:    Past Medical History:  Diagnosis Date   Anemia    in the past   Asthma    " a long time ago"   Bursitis    Depression    13 years ago -    Diabetes mellitus without complication (HCC)    type  2   GERD (gastroesophageal reflux disease)    occ. acid reflux   H/O: hysterectomy    Headache    migraines   Hypertension    Kidney stones    PFO (patent foramen ovale)    hx of PFO with surgery done as an adult   Rotator cuff tear    right   Stroke [facility]) 2009   lasdt stroke July 2015   Syncope and collapse    Past Surgical History:  Procedure Laterality Date   ABDOMINAL HYSTERECTOMY     BREAST REDUCTION SURGERY     CARDIAC CATHETERIZATION     CARDIAC SURGERY  Closed hole in heart in 2009   Amplatzer Septal Occluder Ref [id number] (not listed in op notes, document scanned under MRI with written op notes   HYSTEROTOMY     IR ANGIO INTRA EXTRACRAN SEL COM CAROTID INNOMINATE BILAT MOD SED  04/09/2018   IR ANGIO VERTEBRAL SEL SUBCLAVIAN INNOMINATE UNI R MOD SED  04/09/2018   IR GENERIC HISTORICAL  06/23/2016   IR ANGIO INTRA EXTRACRAN SEL COM CAROTID INNOMINATE BILAT MOD SED 06/23/2016 Julieanne Cotton, MD [facility]   IR GENERIC HISTORICAL  06/23/2016   IR ANGIO VERTEBRAL SEL VERTEBRAL BILAT MOD SED 06/23/2016 Julieanne Cotton, MD [facility]   IR GENERIC HISTORICAL  06/29/2016   IR RADIOLOGIST EVAL & MGMT 06/29/2016 [facility]   IR GENERIC HISTORICAL  07/24/2016   IR INTRA CRAN STENT 07/24/2016 Julieanne Cotton, MD [facility]   IR GENERIC HISTORICAL  07/28/2016   IR PTA INTRACRANIAL 07/28/2016 Julieanne Cotton, MD [facility]   IR GENERIC HISTORICAL  07/28/2016   IR ENDOVASC INTRACRANIAL INF OTHER THAN THROMBO ART INC DIAG ANGIO 07/28/2016 Julieanne Cotton, MD [facility]   IR GENERIC  HISTORICAL  08/07/2016   IR RADIOLOGIST EVAL & MGMT 08/07/2016 [facility]   IR GENERIC HISTORICAL  12/05/2016   IR ANGIO INTRA EXTRACRAN SEL COM CAROTID INNOMINATE BILAT MOD SED 12/05/2016 Julieanne Cotton, MD [facility]   IR GENERIC HISTORICAL  12/05/2016   IR ANGIO VERTEBRAL SEL SUBCLAVIAN INNOMINATE UNI R MOD SED 12/05/2016 Julieanne Cotton, MD [facility]   IR GENERIC HISTORICAL  12/05/2016   IR ANGIO VERTEBRAL SEL VERTEBRAL UNI L MOD SED 12/05/2016 Julieanne Cotton, MD [facility]   RADIOLOGY WITH ANESTHESIA [name] 07/24/2016   Procedure: Stenting;  Surgeon: Julieanne Cotton, MD;  [facility];  Service: Radiology;  Laterality: [name];   RADIOLOGY WITH ANESTHESIA [name] 07/28/2016   Procedure: RADIOLOGY WITH ANESTHESIA;  Surgeon: Medication Radiologist, MD;  [facility]: [facility];  Service: Radiology;  Laterality: [name];   REDUCTION MAMMAPLASTY Bilateral 2000   SHOULDER ARTHROSCOPY WITH OPEN ROTATOR CUFF REPAIR AND DISTAL CLAVICLE ACROMINECTOMY Right 02/04/2015   Procedure: SHOULDER ARTHROSCOPY WITH SUBACROMIAL DECOMPRESSION AND OPEN ROTATOR CUFF REPAIR,;  Surgeon: Cammy Copa, MD;  [facility];  Service: Orthopedics;  Laterality: Right;  RIGHT SHOULDER DOA,SAD,MINI-OPEN ROTATOR CUFF TEAR REPAIR.   Patient Active Problem List   Diagnosis Date Noted   Facet  arthropathy, cervical 05/09/2023   Intractable migraine without aura and without status migrainosus 05/17/2022   Renal insufficiency 03/10/2021   Chronic daily headache 06/19/2017   Intractable chronic migraine without aura and with status migrainosus 09/15/2016   Depression 08/23/2016   Cerebrovascular accident (CVA) due to occlusion of left middle cerebral artery (HCC) 07/28/2016   Acute respiratory failure (HCC)    Middle cerebral artery stenosis, left 07/24/2016   Headache 03/23/2016   Type 2 diabetes mellitus with complication, with long-term current use of insulin (HCC) 09/08/2015   Hyperglycemia    TIA (transient  ischemic attack)    AKI (acute kidney injury) (HCC) 02/20/2015   ARF (acute renal failure) (HCC) 02/20/2015   Rotator cuff tear 02/04/2015   Occipital neuralgia 08/27/2014   Type 2 diabetes mellitus with complication (HCC) 08/27/2014   PFO (patent foramen ovale) 06/17/2014   Hyperlipidemia 06/17/2014   Morbid obesity, unspecified obesity type (HCC) 06/17/2014   Acute CVA (cerebrovascular accident) (HCC) 05/22/2014   Non-compliance 05/22/2014   History of cardioembolic stroke 05/22/2014   Status post device closure of ASD 05/22/2014   HTN (hypertension), benign 05/22/2014   DM type 2 (diabetes mellitus, type 2) (HCC) 05/22/2014    PCP: Salli Real, MD   REFERRING PROVIDER: Ranelle Oyster, MD  REFERRING DIAG: M54.81 (ICD-10-CM) - Bilateral occipital neuralgia M47.812 (ICD-10-CM) - Facet arthropathy, cervical  THERAPY DIAG:  No diagnosis found.  Rationale for Evaluation and Treatment: Rehabilitation  ONSET DATE: chronic  SUBJECTIVE:                                                                                                                                                                                                         SUBJECTIVE STATEMENT: Relates a history of neck pain and migraine HA's over many years.  Has had trigger point and botox injection, medication trials but no PT to date.  Unable to describe distinct aggravating or relieving factors to neck pain. Hand dominance: Right  PERTINENT HISTORY:  Right sided cervicalgia/occipital tenderness. Has DDD at C3-4 with left foraminal stenosis on MRI as well as multi-level facet arthropathy. If this referred pain from her neck, the C2-3 facets on right might be the most likely source.  I do not believe the left-sided foraminal stenosis is contributing to her pain as her symptoms are on the right side. Chronic migraines Hx of left MCA infarct ?  Depression/anxiety.  Her neck and head are so sensitive that I feel that there  almost is a central component to this pain.  PAIN:  Are you  having pain? Yes: NPRS scale: 10/10 Pain location: head and neck Pain description: HA and neck pain Aggravating factors: scents, weather Relieving factors: undetermined  PRECAUTIONS: None  RED FLAGS: None     WEIGHT BEARING RESTRICTIONS: No  FALLS:  Has patient fallen in last 6 months? No  OCCUPATION: medical assistant  PLOF: Independent  PATIENT GOALS: To reduce and manage my neck symptoms  NEXT MD VISIT: September  OBJECTIVE:   DIAGNOSTIC FINDINGS:  Salli Real, MD   PATIENT SURVEYS:  FOTO 42(54 predicted)  POSTURE: rounded shoulders and depressed shoulders R>L  PALPATION: Globally tender to B upper traps, scalenes, levators and cervical paracervicals.   CERVICAL ROM:   Active ROM A/PROM (deg) eval  Flexion 75%P!  Extension 50%P!  Right lateral flexion 25%P!  Left lateral flexion 50%P!  Right rotation 50%P!  Left rotation 50%P!   (Blank rows = not tested)  UPPER EXTREMITY ROM: WFL but painful  Active ROM Right eval Left eval  Shoulder flexion    Shoulder extension    Shoulder abduction    Shoulder adduction    Shoulder extension    Shoulder internal rotation    Shoulder external rotation    Elbow flexion    Elbow extension    Wrist flexion    Wrist extension    Wrist ulnar deviation    Wrist radial deviation    Wrist pronation    Wrist supination     (Blank rows = not tested)  UPPER EXTREMITY MMT: deficits related to pain  MMT Right eval Left eval  Shoulder flexion 4- 4-  Shoulder extension 4- 4-  Shoulder abduction 4- 4-  Shoulder adduction    Shoulder extension    Shoulder internal rotation    Shoulder external rotation    Middle trapezius    Lower trapezius    Elbow flexion    Elbow extension    Wrist flexion    Wrist extension    Wrist ulnar deviation    Wrist radial deviation    Wrist pronation    Wrist supination    Grip strength     (Blank rows = not  tested)  CERVICAL SPECIAL TESTS:  Neck flexor muscle endurance test: Positive 17s  FUNCTIONAL TESTS:  5 times sit to stand: 21s arms crossed  TODAY'S TREATMENT:                                                                                                                              DATE: 06/15/23 Eval   PATIENT EDUCATION:  Education details: Discussed eval findings, rehab rationale and POC and patient is in agreement  Person educated: Patient Education method: Explanation Education comprehension: verbalized understanding and needs further education  HOME EXERCISE PROGRAM: Access Code: [email or web address] URL: [email or web address] Date: 06/15/2023 Prepared by: Gustavus Bryant  Program Notes Just hold position for 5 min  Exercises - Supine Cervical Retraction with Towel  - 2-3 x daily - 5 x weekly -  1 sets -  5 min hold  ASSESSMENT:  CLINICAL IMPRESSION: Patient is a 57 y.o. female who was seen today for physical therapy evaluation and treatment for chronic neck pain and migraines.  BUE AROM functional but does elicit pain through cervical and upper thoracic regions.  BUE strength grades diminished due to pain levels.  Gobal tenderness and irritation noted throughout cervical and crevicothoracic regions.  No distinct pain patterns noted with AROM as all ranges limited by pain and soft tissue irritation and guarding.  Prognosis is guarded due to chronicity, history of migraines, underlying degenerative changes as well as CVA x3.  OBJECTIVE IMPAIRMENTS: decreased activity tolerance, decreased knowledge of condition, decreased ROM, decreased strength, increased fascial restrictions, increased muscle spasms, impaired flexibility, impaired UE functional use, postural dysfunction, obesity, and pain.   ACTIVITY LIMITATIONS: carrying, lifting, reach over head, and hygiene/grooming  PERSONAL FACTORS: Behavior pattern, Past/current experiences, Time since onset of  injury/illness/exacerbation, and 1 comorbidity: prior CVAx3  are also affecting patient's functional outcome.   REHAB POTENTIAL: Fair based on previous unsuccessful conservative treatments  CLINICAL DECISION MAKING: Evolving/moderate complexity  EVALUATION COMPLEXITY: Moderate   GOALS: Goals reviewed with patient? No  SHORT TERM GOALS: Target date: 07/06/2023  Patient to demonstrate independence in HEP  Baseline: [email or web address] Goal status: INITIAL   LONG TERM GOALS: Target date: 07/27/2023    Increase FOTO score to 54 Baseline: 42 Goal status: INITIAL  2.  Increase all AROM c-spine to 75% Baseline:  Active ROM A/PROM (deg) eval  Flexion 75%P!  Extension 50%P!  Right lateral flexion 25%P!  Left lateral flexion 50%P!  Right rotation 50%P!  Left rotation 50%P!   Goal status: INITIAL  3.  Decrease worst pain to 6/10 Baseline: 10/10 Goal status: INITIAL  4.  Increase BUE Strength to 4/5 Baseline:  MMT Right eval Left eval  Shoulder flexion 4- 4-  Shoulder extension 4- 4-  Shoulder abduction 4- 4-   Goal status: INITIAL  5.  Increase cervical deep flexor endurance to 25s Baseline: 17s Goal status: INITIAL   PLAN:  PT FREQUENCY: 1-2x/week  PT DURATION: 6 weeks  PLANNED INTERVENTIONS: Therapeutic exercises, Therapeutic activity, Neuromuscular re-education, Balance training, Gait training, Patient/Family education, Self Care, Joint mobilization, DME instructions, Dry Needling, Electrical stimulation, Spinal mobilization, Cryotherapy, Moist heat, Traction, Manual therapy, and Re-evaluation  PLAN FOR NEXT SESSION: HEP review and update, manual techniques as appropriate, aerobic tasks, ROM and flexibility activities, strengthening and PREs, TPDN, gait and balance training as needed     Hildred Laser, PT 07/02/2023, 1:46 PM  Check all possible CPT codes: 69629 - PT Re-evaluation, 97110- Therapeutic Exercise, [phone]- Neuro Re-education, [phone] - Gait Training, [phone]  - Manual Therapy, 97530 - Therapeutic Activities, 97535 - Self Care, [phone] - Mechanical traction, and 97014 - Electrical stimulation (unattended)    Check all conditions that are expected to impact treatment: {Conditions expected to impact treatment:Contractures, spasticity or fracture relevant to requested treatment and Neurological condition and/or seizures   If treatment provided at initial evaluation, no treatment charged due to lack of authorization.

## 2023-07-10 ENCOUNTER — Other Ambulatory Visit (HOSPITAL_COMMUNITY): Payer: Self-pay

## 2023-07-10 NOTE — Therapy (Unsigned)
OUTPATIENT PHYSICAL THERAPY CERVICAL EVALUATION   Patient Name: Patricia [name] MRN: [id number] DOB:1966-09-01, 57 y.o., female Today's Date: 07/11/2023  END OF SESSION:  PT End of Session - 07/11/23 1447     Visit Number 2    Number of Visits 6    Date for PT Re-Evaluation 08/15/23    Authorization Type MCD    PT Start Time 1445    PT Stop Time 1525    PT Time Calculation (min) 40 min              Past Medical History:  Diagnosis Date   Anemia    in the past   Asthma    " a long time ago"   Bursitis    Depression    13 years ago -    Diabetes mellitus without complication (HCC)    type  2   GERD (gastroesophageal reflux disease)    occ. acid reflux   H/O: hysterectomy    Headache    migraines   Hypertension    Kidney stones    PFO (patent foramen ovale)    hx of PFO with surgery done as an adult   Rotator cuff tear    right   Stroke [facility]) 2009   lasdt stroke July 2015   Syncope and collapse    Past Surgical History:  Procedure Laterality Date   ABDOMINAL HYSTERECTOMY     BREAST REDUCTION SURGERY     CARDIAC CATHETERIZATION     CARDIAC SURGERY  Closed hole in heart in 2009   Amplatzer Septal Occluder Ref 9-[id number] (not listed in op notes, document scanned under MRI with written op notes   HYSTEROTOMY     IR ANGIO INTRA EXTRACRAN SEL COM CAROTID INNOMINATE BILAT MOD SED  04/09/2018   IR ANGIO VERTEBRAL SEL SUBCLAVIAN INNOMINATE UNI R MOD SED  04/09/2018   IR GENERIC HISTORICAL  06/23/2016   IR ANGIO INTRA EXTRACRAN SEL COM CAROTID INNOMINATE BILAT MOD SED 06/23/2016 Julieanne Cotton, MD [facility]   IR GENERIC HISTORICAL  06/23/2016   IR ANGIO VERTEBRAL SEL VERTEBRAL BILAT MOD SED 06/23/2016 Julieanne Cotton, MD [facility]   IR GENERIC HISTORICAL  06/29/2016   IR RADIOLOGIST EVAL & MGMT 06/29/2016 [facility]   IR GENERIC HISTORICAL  07/24/2016   IR INTRA CRAN STENT 07/24/2016 Julieanne Cotton, MD [facility]   IR GENERIC HISTORICAL   07/28/2016   IR PTA INTRACRANIAL 07/28/2016 Julieanne Cotton, MD [facility]   IR GENERIC HISTORICAL  07/28/2016   IR ENDOVASC INTRACRANIAL INF OTHER THAN THROMBO ART INC DIAG ANGIO 07/28/2016 Julieanne Cotton, MD [facility]   IR GENERIC HISTORICAL  08/07/2016   IR RADIOLOGIST EVAL & MGMT 08/07/2016 [facility]   IR GENERIC HISTORICAL  12/05/2016   IR ANGIO INTRA EXTRACRAN SEL COM CAROTID INNOMINATE BILAT MOD SED 12/05/2016 Julieanne Cotton, MD [facility]   IR GENERIC HISTORICAL  12/05/2016   IR ANGIO VERTEBRAL SEL SUBCLAVIAN INNOMINATE UNI R MOD SED 12/05/2016 Julieanne Cotton, MD [facility]   IR GENERIC HISTORICAL  12/05/2016   IR ANGIO VERTEBRAL SEL VERTEBRAL UNI L MOD SED 12/05/2016 Julieanne Cotton, MD [facility]   RADIOLOGY WITH ANESTHESIA [name] 07/24/2016   Procedure: Stenting;  Surgeon: Julieanne Cotton, MD;  [facility];  Service: Radiology;  Laterality: [name];   RADIOLOGY WITH ANESTHESIA [name] 07/28/2016   Procedure: RADIOLOGY WITH ANESTHESIA;  Surgeon: Medication Radiologist, MD;  [facility];  Service: Radiology;  Laterality: [name];   REDUCTION  MAMMAPLASTY Bilateral 2000   SHOULDER ARTHROSCOPY WITH OPEN ROTATOR CUFF REPAIR AND DISTAL CLAVICLE ACROMINECTOMY Right 02/04/2015   Procedure: SHOULDER ARTHROSCOPY WITH SUBACROMIAL DECOMPRESSION AND OPEN ROTATOR CUFF REPAIR,;  Surgeon: Cammy Copa, MD;  [facility];  Service: Orthopedics;  Laterality: Right;  RIGHT SHOULDER DOA,SAD,MINI-OPEN ROTATOR CUFF TEAR REPAIR.   Patient Active Problem List   Diagnosis Date Noted   Cervicalgia 07/11/2023   Facet arthropathy, cervical 05/09/2023   Intractable migraine without aura and without status migrainosus 05/17/2022   Renal insufficiency 03/10/2021   Chronic daily headache 06/19/2017   Intractable chronic migraine without aura and with status migrainosus 09/15/2016   Depression 08/23/2016   Cerebrovascular accident (CVA) due to occlusion of left middle cerebral  artery (HCC) 07/28/2016   Acute respiratory failure (HCC)    Middle cerebral artery stenosis, left 07/24/2016   Headache 03/23/2016   Type 2 diabetes mellitus with complication, with long-term current use of insulin (HCC) 09/08/2015   Hyperglycemia    TIA (transient ischemic attack)    AKI (acute kidney injury) (HCC) 02/20/2015   ARF (acute renal failure) (HCC) 02/20/2015   Rotator cuff tear 02/04/2015   Occipital neuralgia 08/27/2014   Type 2 diabetes mellitus with complication (HCC) 08/27/2014   PFO (patent foramen ovale) 06/17/2014   Hyperlipidemia 06/17/2014   Morbid obesity, unspecified obesity type (HCC) 06/17/2014   Acute CVA (cerebrovascular accident) (HCC) 05/22/2014   Non-compliance 05/22/2014   History of cardioembolic stroke 05/22/2014   Status post device closure of ASD 05/22/2014   HTN (hypertension), benign 05/22/2014   DM type 2 (diabetes mellitus, type 2) (HCC) 05/22/2014    PCP: Salli Real, MD   REFERRING PROVIDER: Ranelle Oyster, MD  REFERRING DIAG: M54.81 (ICD-10-CM) - Bilateral occipital neuralgia M47.812 (ICD-10-CM) - Facet arthropathy, cervical  THERAPY DIAG:  Cervicalgia  Muscle weakness (generalized)  Rationale for Evaluation and Treatment: Rehabilitation  ONSET DATE: chronic  SUBJECTIVE:                                                                                                                                                                                                         SUBJECTIVE STATEMENT: No changes to report since initial visit Hand dominance: Right  PERTINENT HISTORY:  Right sided cervicalgia/occipital tenderness. Has DDD at C3-4 with left foraminal stenosis on MRI as well as multi-level facet arthropathy. If this referred pain from her neck, the C2-3 facets on right might be the most likely source.  I do not believe the left-sided foraminal stenosis is contributing to her pain as her  symptoms are on the right side. Chronic  migraines Hx of left MCA infarct ?  Depression/anxiety.  Her neck and head are so sensitive that I feel that there almost is a central component to this pain.  PAIN:  Are you having pain? Yes: NPRS scale: 10/10 Pain location: head and neck Pain description: HA and neck pain Aggravating factors: scents, weather Relieving factors: undetermined  PRECAUTIONS: None  RED FLAGS: None     WEIGHT BEARING RESTRICTIONS: No  FALLS:  Has patient fallen in last 6 months? No  OCCUPATION: medical assistant  PLOF: Independent  PATIENT GOALS: To reduce and manage my neck symptoms  NEXT MD VISIT: September  OBJECTIVE:   DIAGNOSTIC FINDINGS:  Salli Real, MD   PATIENT SURVEYS:  FOTO 42(54 predicted)  POSTURE: rounded shoulders and depressed shoulders R>L  PALPATION: Globally tender to B upper traps, scalenes, levators and cervical paracervicals.   CERVICAL ROM:   Active ROM A/PROM (deg) eval  Flexion 75%P!  Extension 50%P!  Right lateral flexion 25%P!  Left lateral flexion 50%P!  Right rotation 50%P!  Left rotation 50%P!   (Blank rows = not tested)  UPPER EXTREMITY ROM: WFL but painful  Active ROM Right eval Left eval  Shoulder flexion    Shoulder extension    Shoulder abduction    Shoulder adduction    Shoulder extension    Shoulder internal rotation    Shoulder external rotation    Elbow flexion    Elbow extension    Wrist flexion    Wrist extension    Wrist ulnar deviation    Wrist radial deviation    Wrist pronation    Wrist supination     (Blank rows = not tested)  UPPER EXTREMITY MMT: deficits related to pain  MMT Right eval Left eval  Shoulder flexion 4- 4-  Shoulder extension 4- 4-  Shoulder abduction 4- 4-  Shoulder adduction    Shoulder extension    Shoulder internal rotation    Shoulder external rotation    Middle trapezius    Lower trapezius    Elbow flexion    Elbow extension    Wrist flexion    Wrist extension    Wrist ulnar  deviation    Wrist radial deviation    Wrist pronation    Wrist supination    Grip strength     (Blank rows = not tested)  CERVICAL SPECIAL TESTS:  Neck flexor muscle endurance test: Positive 17s  FUNCTIONAL TESTS:  5 times sit to stand: 21s arms crossed  TODAY'S TREATMENT:      [facility] Adult PT Treatment:                                                DATE: 07/11/23  Manual Therapy: Skilled palpation to identify taught and irritable bands in R suboccipitals and cervical multifidi Trigger Point Dry Needling Treatment: Pre-treatment instruction: Patient instructed on dry needling rationale, procedures, and possible side effects including pain during treatment (achy,cramping feeling), bruising, drop of blood, lightheadedness, nausea, sweating. Patient Consent Given: Yes Education handout provided: No Muscles treated: R suboccipitals and cervical multifidi  Needle size and number: .25x41mm x 2 Electrical stimulation performed: No Parameters: [name] Treatment response/outcome: Palpable decrease in muscle tension and increased cervical rotation post -technique Post-treatment instructions: Patient instructed to expect possible mild to moderate muscle soreness later  today and/or tomorrow. Patient instructed in methods to reduce muscle soreness and to continue prescribed HEP. If patient was dry needled over the lung field, patient was instructed on signs and symptoms of pneumothorax and, however unlikely, to see immediate medical attention should they occur. Patient was also educated on signs and symptoms of infection and to seek medical attention should they occur. Patient verbalized understanding of these instructions and education.               SO release 5 min                                                                                                            DATE: 06/15/23 Eval   PATIENT EDUCATION:  Education details: Discussed eval findings, rehab rationale and POC and patient is in  agreement  Person educated: Patient Education method: Explanation Education comprehension: verbalized understanding and needs further education  HOME EXERCISE PROGRAM: Access Code: [email or web address] URL: [email or web address] Date: 06/15/2023 Prepared by: Gustavus Bryant  Program Notes Just hold position for 5 min  Exercises - Supine Cervical Retraction with Towel  - 2-3 x daily - 5 x weekly - 1 sets -  5 min hold  ASSESSMENT:  CLINICAL IMPRESSION: First f/u session since eval.  Focus was on manual techniques including TPDN and SO release.  Decreased tension reported by patient and palpated by therapist.  Cervical rotation ~ 75% B following session, still painful though.  OBJECTIVE IMPAIRMENTS: decreased activity tolerance, decreased knowledge of condition, decreased ROM, decreased strength, increased fascial restrictions, increased muscle spasms, impaired flexibility, impaired UE functional use, postural dysfunction, obesity, and pain.   ACTIVITY LIMITATIONS: carrying, lifting, reach over head, and hygiene/grooming  PERSONAL FACTORS: Behavior pattern, Past/current experiences, Time since onset of injury/illness/exacerbation, and 1 comorbidity: prior CVAx3  are also affecting patient's functional outcome.   REHAB POTENTIAL: Fair based on previous unsuccessful conservative treatments  CLINICAL DECISION MAKING: Evolving/moderate complexity  EVALUATION COMPLEXITY: Moderate   GOALS: Goals reviewed with patient? No  SHORT TERM GOALS: Target date: 07/06/2023  Patient to demonstrate independence in HEP  Baseline: [email or web address] Goal status: INITIAL   LONG TERM GOALS: Target date: 07/27/2023    Increase FOTO score to 54 Baseline: 42 Goal status: INITIAL  2.  Increase all AROM c-spine to 75% Baseline:  Active ROM A/PROM (deg) eval  Flexion 75%P!  Extension 50%P!  Right lateral flexion 25%P!  Left lateral flexion 50%P!  Right rotation 50%P!  Left rotation 50%P!   Goal  status: INITIAL  3.  Decrease worst pain to 6/10 Baseline: 10/10 Goal status: INITIAL  4.  Increase BUE Strength to 4/5 Baseline:  MMT Right eval Left eval  Shoulder flexion 4- 4-  Shoulder extension 4- 4-  Shoulder abduction 4- 4-   Goal status: INITIAL  5.  Increase cervical deep flexor endurance to 25s Baseline: 17s Goal status: INITIAL   PLAN:  PT FREQUENCY: 1-2x/week  PT DURATION: 6 weeks  PLANNED INTERVENTIONS: Therapeutic exercises, Therapeutic activity, Neuromuscular re-education, Balance training, Gait training, Patient/Family education, Self Care, Joint  mobilization, DME instructions, Dry Needling, Electrical stimulation, Spinal mobilization, Cryotherapy, Moist heat, Traction, Manual therapy, and Re-evaluation  PLAN FOR NEXT SESSION: HEP review and update, manual techniques as appropriate, aerobic tasks, ROM and flexibility activities, strengthening and PREs, TPDN, gait and balance training as needed     Hildred Laser, PT 07/11/2023, 3:35 PM  Check all possible CPT codes: 16109 - PT Re-evaluation, 97110- Therapeutic Exercise, [phone]- Neuro Re-education, [phone] - Gait Training, [phone] - Manual Therapy, 97530 - Therapeutic Activities, 97535 - Self Care, [phone] - Mechanical traction, and [id number] - Electrical stimulation (unattended)    Check all conditions that are expected to impact treatment: {Conditions expected to impact treatment:Contractures, spasticity or fracture relevant to requested treatment and Neurological condition and/or seizures   If treatment provided at initial evaluation, no treatment charged due to lack of authorization.

## 2023-07-11 ENCOUNTER — Encounter: Payer: Self-pay | Admitting: Physical Medicine & Rehabilitation

## 2023-07-11 ENCOUNTER — Encounter: Payer: Medicaid Other | Attending: Physical Medicine & Rehabilitation | Admitting: Physical Medicine & Rehabilitation

## 2023-07-11 ENCOUNTER — Ambulatory Visit: Payer: Medicaid Other | Attending: Physical Medicine & Rehabilitation

## 2023-07-11 VITALS — BP 114/74 | HR 79 | Ht 65.5 in | Wt 190.6 lb

## 2023-07-11 DIAGNOSIS — R519 Headache, unspecified: Secondary | ICD-10-CM | POA: Diagnosis present

## 2023-07-11 DIAGNOSIS — G43019 Migraine without aura, intractable, without status migrainosus: Secondary | ICD-10-CM

## 2023-07-11 DIAGNOSIS — M6281 Muscle weakness (generalized): Secondary | ICD-10-CM | POA: Insufficient documentation

## 2023-07-11 DIAGNOSIS — M542 Cervicalgia: Secondary | ICD-10-CM | POA: Diagnosis not present

## 2023-07-11 NOTE — Patient Instructions (Signed)
THINK ABOUT HOW YOUR MOOD MIGHT BE IMPACTING YOUR PAIN. IF YOU WOULD LIKE TO START SOMETHING FOR HEADACHE AND MOOD, PLEASE LET ME KNOW.   MAKE THERAPY A PRIORITY FOR NOW SO THAT WE CAN SEE IF IT HELPS YOU

## 2023-07-11 NOTE — Progress Notes (Signed)
Subjective:    Patient ID: Patricia [name], female    DOB: [birth year], 57 y.o.   MRN: [id number]  HPI  The patient is here in follow up of her chronic posterior headache/migraine/cervicalgia. I referred her to PT at last visit and it appears she's had one visit on 8/23. She has another visit this afternoon. She states that "life" kept her from getting back sooner. (Work and other personal items which she did not get into) She currently is having frontal headache also due to the change in weather/rain.   She tried OTC icey hot patches, vapor rub, but nothing really made much of a difference.   She takes tizanidine once daily only because it makes her sleepy. It helps her relax more than anything else. She works as a Engineer, site.   Pt remains on her migraine regimen per neurology.   Pain Inventory Average Pain 8 Pain Right Now 9 My pain is dull, stabbing, and aching  In the last 24 hours, has pain interfered with the following? General activity 5 Relation with others 3 Enjoyment of life 6 What TIME of day is your pain at its worst? morning , daytime, evening, and night Sleep (in general) Poor  Pain is worse with: unsure Pain improves with:  not answered Relief from Meds: 0  Family History  Problem Relation Age of Onset   Hypertension Mother    Hypertension Father    Cancer Maternal Aunt    Breast cancer Maternal Aunt    Cancer Maternal Uncle    Social History   Socioeconomic History   Marital status: Single    Spouse name: Not on file   Number of children: 4   Years of education: College    Highest education level: Not on file  Occupational History   Occupation: [name]    Employer: [employer]  Tobacco Use   Smoking status: Never   Smokeless tobacco: Never  Vaping Use   Vaping status: Never Used  Substance and Sexual Activity   Alcohol use: Yes    Alcohol/week: 0.0 standard drinks of alcohol    Comment: occasional   Drug use: No   Sexual  activity: Not Currently  Other Topics Concern   Not on file  Social History Narrative   Patient is single with 4 children.   Patient is right handed.   Patient has college education.   Patient drinks 1 cup daily.   One story home   Social Determinants of Health   Financial Resource Strain: Not on file  Food Insecurity: Not on file  Transportation Needs: Not on file  Physical Activity: Not on file  Stress: Not on file  Social Connections: Not on file   Past Surgical History:  Procedure Laterality Date   ABDOMINAL HYSTERECTOMY     BREAST REDUCTION SURGERY     CARDIAC CATHETERIZATION     CARDIAC SURGERY  Closed hole in heart in 2009   Amplatzer Septal Occluder Ref 9-[id number] (not listed in op notes, document scanned under MRI with written op notes   HYSTEROTOMY     IR ANGIO INTRA EXTRACRAN SEL COM CAROTID INNOMINATE BILAT MOD SED  04/09/2018   IR ANGIO VERTEBRAL SEL SUBCLAVIAN INNOMINATE UNI R MOD SED  04/09/2018   IR GENERIC HISTORICAL  06/23/2016   IR ANGIO INTRA EXTRACRAN SEL COM CAROTID INNOMINATE BILAT MOD SED 06/23/2016 Julieanne Cotton, MD [facility]   IR GENERIC HISTORICAL  06/23/2016   IR ANGIO VERTEBRAL SEL VERTEBRAL BILAT  MOD SED 06/23/2016 Julieanne Cotton, MD [facility]   IR GENERIC HISTORICAL  06/29/2016   IR RADIOLOGIST EVAL & MGMT 06/29/2016 [facility]   IR GENERIC HISTORICAL  07/24/2016   IR INTRA CRAN STENT 07/24/2016 Julieanne Cotton, MD [facility]   IR GENERIC HISTORICAL  07/28/2016   IR PTA INTRACRANIAL 07/28/2016 Julieanne Cotton, MD [facility]   IR GENERIC HISTORICAL  07/28/2016   IR ENDOVASC INTRACRANIAL INF OTHER THAN THROMBO ART INC DIAG ANGIO 07/28/2016 Julieanne Cotton, MD [facility]   IR GENERIC HISTORICAL  08/07/2016   IR RADIOLOGIST EVAL & MGMT 08/07/2016 [facility]   IR GENERIC HISTORICAL  12/05/2016   IR ANGIO INTRA EXTRACRAN SEL COM CAROTID INNOMINATE BILAT MOD SED 12/05/2016 Julieanne Cotton, MD [facility]   IR GENERIC  HISTORICAL  12/05/2016   IR ANGIO VERTEBRAL SEL SUBCLAVIAN INNOMINATE UNI R MOD SED 12/05/2016 Julieanne Cotton, MD [facility]   IR GENERIC HISTORICAL  12/05/2016   IR ANGIO VERTEBRAL SEL VERTEBRAL UNI L MOD SED 12/05/2016 Julieanne Cotton, MD [facility]   RADIOLOGY WITH ANESTHESIA [name] 07/24/2016   Procedure: Stenting;  Surgeon: Julieanne Cotton, MD;  [facility];  Service: Radiology;  Laterality: [name];   RADIOLOGY WITH ANESTHESIA [name] 07/28/2016   Procedure: RADIOLOGY WITH ANESTHESIA;  Surgeon: Medication Radiologist, MD;  [facility];  Service: Radiology;  Laterality: [name];   REDUCTION MAMMAPLASTY Bilateral 2000   SHOULDER ARTHROSCOPY WITH OPEN ROTATOR CUFF REPAIR AND DISTAL CLAVICLE ACROMINECTOMY Right 02/04/2015   Procedure: SHOULDER ARTHROSCOPY WITH SUBACROMIAL DECOMPRESSION AND OPEN ROTATOR CUFF REPAIR,;  Surgeon: Cammy Copa, MD;  [facility];  Service: Orthopedics;  Laterality: Right;  RIGHT SHOULDER DOA,SAD,MINI-OPEN ROTATOR CUFF TEAR REPAIR.   Past Surgical History:  Procedure Laterality Date   ABDOMINAL HYSTERECTOMY     BREAST REDUCTION SURGERY     CARDIAC CATHETERIZATION     CARDIAC SURGERY  Closed hole in heart in 2009   Amplatzer Septal Occluder Ref 9-ASD-010 (not listed in op notes, document scanned under MRI with written op notes   HYSTEROTOMY     IR ANGIO INTRA EXTRACRAN SEL COM CAROTID INNOMINATE BILAT MOD SED  04/09/2018   IR ANGIO VERTEBRAL SEL SUBCLAVIAN INNOMINATE UNI R MOD SED  04/09/2018   IR GENERIC HISTORICAL  06/23/2016   IR ANGIO INTRA EXTRACRAN SEL COM CAROTID INNOMINATE BILAT MOD SED 06/23/2016 Julieanne Cotton, MD [facility]   IR GENERIC HISTORICAL  06/23/2016   IR ANGIO VERTEBRAL SEL VERTEBRAL BILAT MOD SED 06/23/2016 Julieanne Cotton, MD [facility]   IR GENERIC HISTORICAL  06/29/2016   IR RADIOLOGIST EVAL & MGMT 06/29/2016 [facility]   IR GENERIC HISTORICAL  07/24/2016   IR INTRA CRAN STENT 07/24/2016 Julieanne Cotton, MD  [facility]   IR GENERIC HISTORICAL  07/28/2016   IR PTA INTRACRANIAL 07/28/2016 Julieanne Cotton, MD [facility]   IR GENERIC HISTORICAL  07/28/2016   IR ENDOVASC INTRACRANIAL INF OTHER THAN THROMBO ART INC DIAG ANGIO 07/28/2016 Julieanne Cotton, MD [facility]   IR GENERIC HISTORICAL  08/07/2016   IR RADIOLOGIST EVAL & MGMT 08/07/2016 [facility]   IR GENERIC HISTORICAL  12/05/2016   IR ANGIO INTRA EXTRACRAN SEL COM CAROTID INNOMINATE BILAT MOD SED 12/05/2016 Julieanne Cotton, MD [facility]   IR GENERIC HISTORICAL  12/05/2016   IR ANGIO VERTEBRAL SEL SUBCLAVIAN INNOMINATE UNI R MOD SED 12/05/2016 Julieanne Cotton, MD [facility]   IR GENERIC HISTORICAL  12/05/2016   IR ANGIO VERTEBRAL SEL VERTEBRAL UNI L  MOD SED 12/05/2016 Julieanne Cotton, MD [facility]   RADIOLOGY WITH ANESTHESIA [name] 07/24/2016   Procedure: Stenting;  Surgeon: Julieanne Cotton, MD;  Location: [facility] OR;  Service: Radiology;  Laterality: [name];   RADIOLOGY WITH ANESTHESIA [name] 07/28/2016   Procedure: RADIOLOGY WITH ANESTHESIA;  Surgeon: Medication Radiologist, MD;  [facility];  Service: Radiology;  Laterality: [name];   REDUCTION MAMMAPLASTY Bilateral 2000   SHOULDER ARTHROSCOPY WITH OPEN ROTATOR CUFF REPAIR AND DISTAL CLAVICLE ACROMINECTOMY Right 02/04/2015   Procedure: SHOULDER ARTHROSCOPY WITH SUBACROMIAL DECOMPRESSION AND OPEN ROTATOR CUFF REPAIR,;  Surgeon: Cammy Copa, MD;  [facility];  Service: Orthopedics;  Laterality: Right;  RIGHT SHOULDER DOA,SAD,MINI-OPEN ROTATOR CUFF TEAR REPAIR.   Past Medical History:  Diagnosis Date   Anemia    in the past   Asthma    " a long time ago"   Bursitis    Depression    13 years ago -    Diabetes mellitus without complication (HCC)    type  2   GERD (gastroesophageal reflux disease)    occ. acid reflux   H/O: hysterectomy    Headache    migraines   Hypertension    Kidney stones    PFO (patent foramen ovale)    hx of PFO with surgery done as  an adult   Rotator cuff tear    right   Stroke [facility]) 2009   lasdt stroke July 2015   Syncope and collapse    BP 114/74   Pulse 79   Ht 5' 5.5" (1.664 m)   Wt 190 lb 9.6 oz (86.5 kg)   SpO2 99%   BMI 31.24 kg/m   Opioid Risk Score:   Fall Risk Score:  `1  Depression screen [facility] 2/9     07/11/2023   11:20 AM 05/09/2023    1:19 PM  Depression screen PHQ 2/9  Decreased Interest 0 2  Down, Depressed, Hopeless 0 2  PHQ - 2 Score 0 4  Altered sleeping  2  Tired, decreased energy  1  Change in appetite  2  Feeling bad or failure about yourself   2  Trouble concentrating  2  Moving slowly or fidgety/restless  1  Suicidal thoughts  0  PHQ-9 Score  14  Difficult doing work/chores  Somewhat difficult     Review of Systems  Constitutional: Negative.   HENT: Negative.    Eyes: Negative.   Respiratory: Negative.    Cardiovascular: Negative.   Gastrointestinal: Negative.   Endocrine: Negative.   Genitourinary: Negative.   Musculoskeletal: Negative.   Skin: Negative.   Allergic/Immunologic: Negative.   Neurological:  Positive for headaches.  Hematological:  Bruises/bleeds easily.       Brillinta  Psychiatric/Behavioral: Negative.    All other systems reviewed and are negative.      Objective:   Physical Exam General: No acute distress HEENT: NCAT, EOMI, oral membranes moist Cards: reg rate  Chest: normal effort Abdomen: Soft, NT, ND Skin: dry, intact Extremities: no edema Psych: flat, tearful at times Skin: intact Neuro: Alert and oriented x 3. Normal insight and awareness. Intact Memory. Normal language and speech. Cranial nerve exam unremarkable. MMT: 5/5 UE and LE. No focal sensory deficits except for finger and toe tips. DTR's 1+. .   Musculoskeletal: Tight cervical and shoulder girdle muscuclature, particularly the right trap.  Both pectoralis muscles are tight as well on either side.  Occiput tender.  Flexion increased pain as did rotation to the  left and  extension.  Pain was exclusively on the right side once again.           Assessment & Plan:    Right sided cervicalgia/occipital tenderness. Has DDD at C3-4 with left foraminal stenosis on MRI as well as multi-level facet arthropathy. If this referred pain from her neck, the C2-3 facets on right might be the most likely source.  I do not believe the left-sided foraminal stenosis is contributing to her pain as her symptoms are on the right side. Chronic migraines Hx of left MCA infarct ?  Depression/anxiety.  Her neck and head are so sensitive that I feel that there almost is a central component to this pain.     Plan: Resume therapies--.modalities will be utilized per iniital assessment. Hopefully we can find some methods with which she can find relief Migraine mgt per neuro May need to think about starting an antidepressant. I believe this is a big component to pain. Consider SSNRI   Twenty minutes of face to face patient care time were spent during this visit. All questions were encouraged and answered.  Follow up with me in 2 mos .

## 2023-07-19 ENCOUNTER — Other Ambulatory Visit: Payer: Self-pay

## 2023-07-19 ENCOUNTER — Ambulatory Visit: Payer: Medicaid Other

## 2023-07-19 DIAGNOSIS — M542 Cervicalgia: Secondary | ICD-10-CM | POA: Diagnosis not present

## 2023-07-19 DIAGNOSIS — M6281 Muscle weakness (generalized): Secondary | ICD-10-CM
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
## 2023-07-25 ENCOUNTER — Other Ambulatory Visit (HOSPITAL_COMMUNITY): Payer: Self-pay

## 2023-07-25 ENCOUNTER — Other Ambulatory Visit: Payer: Self-pay

## 2023-07-26 ENCOUNTER — Ambulatory Visit: Payer: Medicaid Other | Attending: Physical Medicine & Rehabilitation

## 2023-07-26 DIAGNOSIS — M6281 Muscle weakness (generalized): Secondary | ICD-10-CM | POA: Insufficient documentation

## 2023-07-26 DIAGNOSIS — M542 Cervicalgia: Secondary | ICD-10-CM | POA: Diagnosis present

## 2023-07-31 NOTE — Therapy (Unsigned)
OUTPATIENT PHYSICAL THERAPY CERVICAL TREATMENT NOTE   Patient Name: Patricia [name] MRN: [id number] DOB:10-Nov-1965, 57 y.o., female Today's Date: 07/31/2023  END OF SESSION:       Past Medical History:  Diagnosis Date   Anemia    in the past   Asthma    " a long time ago"   Bursitis    Depression    13 years ago -    Diabetes mellitus without complication (HCC)    type  2   GERD (gastroesophageal reflux disease)    occ. acid reflux   H/O: hysterectomy    Headache    migraines   Hypertension    Kidney stones    PFO (patent foramen ovale)    hx of PFO with surgery done as an adult   Rotator cuff tear    right   Stroke [facility]) 2009   lasdt stroke July 2015   Syncope and collapse    Past Surgical History:  Procedure Laterality Date   ABDOMINAL HYSTERECTOMY     BREAST REDUCTION SURGERY     CARDIAC CATHETERIZATION     CARDIAC SURGERY  Closed hole in heart in 2009   Amplatzer Septal Occluder Ref [id number] (not listed in op notes, document scanned under MRI with written op notes   HYSTEROTOMY     IR ANGIO INTRA EXTRACRAN SEL COM CAROTID INNOMINATE BILAT MOD SED  04/09/2018   IR ANGIO VERTEBRAL SEL SUBCLAVIAN INNOMINATE UNI R MOD SED  04/09/2018   IR GENERIC HISTORICAL  06/23/2016   IR ANGIO INTRA EXTRACRAN SEL COM CAROTID INNOMINATE BILAT MOD SED 06/23/2016 Julieanne Cotton, MD [facility]   IR GENERIC HISTORICAL  06/23/2016   IR ANGIO VERTEBRAL SEL VERTEBRAL BILAT MOD SED 06/23/2016 Julieanne Cotton, MD [facility]   IR GENERIC HISTORICAL  06/29/2016   IR RADIOLOGIST EVAL & MGMT 06/29/2016 [facility]   IR GENERIC HISTORICAL  07/24/2016   IR INTRA CRAN STENT 07/24/2016 Julieanne Cotton, MD [facility]   IR GENERIC HISTORICAL  07/28/2016   IR PTA INTRACRANIAL 07/28/2016 Julieanne Cotton, MD [facility]   IR GENERIC HISTORICAL  07/28/2016   IR ENDOVASC INTRACRANIAL INF OTHER THAN THROMBO ART INC DIAG ANGIO 07/28/2016 Julieanne Cotton, MD [facility]    IR GENERIC HISTORICAL  08/07/2016   IR RADIOLOGIST EVAL & MGMT 08/07/2016 [facility]   IR GENERIC HISTORICAL  12/05/2016   IR ANGIO INTRA EXTRACRAN SEL COM CAROTID INNOMINATE BILAT MOD SED 12/05/2016 Julieanne Cotton, MD [facility]   IR GENERIC HISTORICAL  12/05/2016   IR ANGIO VERTEBRAL SEL SUBCLAVIAN INNOMINATE UNI R MOD SED 12/05/2016 Julieanne Cotton, MD [facility]   IR GENERIC HISTORICAL  12/05/2016   IR ANGIO VERTEBRAL SEL VERTEBRAL UNI L MOD SED 12/05/2016 Julieanne Cotton, MD [facility]   RADIOLOGY WITH ANESTHESIA [name] 07/24/2016   Procedure: Stenting;  Surgeon: Julieanne Cotton, MD;  Location: [facility];  Service: Radiology;  Laterality: [name];   RADIOLOGY WITH ANESTHESIA [name] 07/28/2016   Procedure: RADIOLOGY WITH ANESTHESIA;  Surgeon: Medication Radiologist, MD;  Location: [facility];  Service: Radiology;  Laterality: [name];   REDUCTION MAMMAPLASTY Bilateral 2000   SHOULDER ARTHROSCOPY WITH OPEN ROTATOR CUFF REPAIR AND DISTAL CLAVICLE ACROMINECTOMY Right 02/04/2015   Procedure: SHOULDER ARTHROSCOPY WITH SUBACROMIAL DECOMPRESSION AND OPEN ROTATOR CUFF REPAIR,;  Surgeon: Cammy Copa, MD;  Location: [facility];  Service: Orthopedics;  Laterality: Right;  RIGHT SHOULDER DOA,SAD,MINI-OPEN ROTATOR CUFF TEAR REPAIR.   Patient Active Problem List   Diagnosis Date  Noted   Cervicalgia 07/11/2023   Facet arthropathy, cervical 05/09/2023   Intractable migraine without aura and without status migrainosus 05/17/2022   Renal insufficiency 03/10/2021   Chronic daily headache 06/19/2017   Intractable chronic migraine without aura and with status migrainosus 09/15/2016   Depression 08/23/2016   Cerebrovascular accident (CVA) due to occlusion of left middle cerebral artery (HCC) 07/28/2016   Acute respiratory failure (HCC)    Middle cerebral artery stenosis, left 07/24/2016   Headache 03/23/2016   Type 2 diabetes mellitus with complication, with long-term current use of insulin (HCC) 09/08/2015    Hyperglycemia    TIA (transient ischemic attack)    AKI (acute kidney injury) (HCC) 02/20/2015   ARF (acute renal failure) (HCC) 02/20/2015   Rotator cuff tear 02/04/2015   Occipital neuralgia 08/27/2014   Type 2 diabetes mellitus with complication (HCC) 08/27/2014   PFO (patent foramen ovale) 06/17/2014   Hyperlipidemia 06/17/2014   Morbid obesity, unspecified obesity type (HCC) 06/17/2014   Acute CVA (cerebrovascular accident) (HCC) 05/22/2014   Non-compliance 05/22/2014   History of cardioembolic stroke 05/22/2014   Status post device closure of ASD 05/22/2014   HTN (hypertension), benign 05/22/2014   DM type 2 (diabetes mellitus, type 2) (HCC) 05/22/2014    PCP: Salli Real, MD   REFERRING PROVIDER: Ranelle Oyster, MD  REFERRING DIAG: M54.81 (ICD-10-CM) - Bilateral occipital neuralgia M47.812 (ICD-10-CM) - Facet arthropathy, cervical  THERAPY DIAG:  No diagnosis found.  Rationale for Evaluation and Treatment: Rehabilitation  ONSET DATE: chronic  SUBJECTIVE:                                                                                                                                                                                                         SUBJECTIVE STATEMENT:  Better than last session but continued symptoms overall.  All cervical mobility increases symptoms, no distinct pattern identified.   Hand dominance: Right  PERTINENT HISTORY:  Right sided cervicalgia/occipital tenderness. Has DDD at C3-4 with left foraminal stenosis on MRI as well as multi-level facet arthropathy. If this referred pain from her neck, the C2-3 facets on right might be the most likely source.  I do not believe the left-sided foraminal stenosis is contributing to her pain as her symptoms are on the right side. Chronic migraines Hx of left MCA infarct ?  Depression/anxiety.  Her neck and head are so sensitive that I feel that there almost is a central component to this  pain.  PAIN:  Are you having pain? Yes: NPRS scale: 10/10 Pain location: head and  neck Pain description: HA and neck pain Aggravating factors: scents, weather Relieving factors: undetermined  PRECAUTIONS: None  RED FLAGS: None     WEIGHT BEARING RESTRICTIONS: No  FALLS:  Has patient fallen in last 6 months? No  OCCUPATION: medical assistant  PLOF: Independent  PATIENT GOALS: To reduce and manage my neck symptoms  NEXT MD VISIT: September  OBJECTIVE:   DIAGNOSTIC FINDINGS:  Salli Real, MD   PATIENT SURVEYS:  FOTO 42(54 predicted)  POSTURE: rounded shoulders and depressed shoulders R>L  PALPATION: Globally tender to B upper traps, scalenes, levators and cervical paracervicals.   CERVICAL ROM:   Active ROM A/PROM (deg) eval AROM 07/26/23  Flexion 75%P!   Extension 50%P! 25%  Right lateral flexion 25%P! 10%  Left lateral flexion 50%P! 75%  Right rotation 50%P! 75%  Left rotation 50%P! 50%   (Blank rows = not tested)  UPPER EXTREMITY ROM: WFL but painful  Active ROM Right eval Left eval  Shoulder flexion    Shoulder extension    Shoulder abduction    Shoulder adduction    Shoulder extension    Shoulder internal rotation    Shoulder external rotation    Elbow flexion    Elbow extension    Wrist flexion    Wrist extension    Wrist ulnar deviation    Wrist radial deviation    Wrist pronation    Wrist supination     (Blank rows = not tested)  UPPER EXTREMITY MMT: deficits related to pain  MMT Right eval Left eval  Shoulder flexion 4- 4-  Shoulder extension 4- 4-  Shoulder abduction 4- 4-  Shoulder adduction    Shoulder extension    Shoulder internal rotation    Shoulder external rotation    Middle trapezius    Lower trapezius    Elbow flexion    Elbow extension    Wrist flexion    Wrist extension    Wrist ulnar deviation    Wrist radial deviation    Wrist pronation    Wrist supination    Grip strength     (Blank rows = not  tested)  CERVICAL SPECIAL TESTS:  Neck flexor muscle endurance test: Positive 17s  FUNCTIONAL TESTS:  5 times sit to stand: 21s arms crossed  TODAY'S TREATMENT:      [facility] Adult PT Treatment:                                                DATE: 07/26/23  Manual Therapy: SO traction avoiding nuchal line 8 min duration Skilled palpation to identify taught and irritable bands in LUT Trigger Point Dry Needling Treatment: Pre-treatment instruction: Patient instructed on dry needling rationale, procedures, and possible side effects including pain during treatment (achy,cramping feeling), bruising, drop of blood, lightheadedness, nausea, sweating. Patient Consent Given: Yes Education handout provided: No Muscles treated: L UT   Needle size and number: .25x44mm x 1 Electrical stimulation performed: No Parameters: [name] Treatment response/outcome: Twitch response elicited and Palpable decrease in muscle tension Post-treatment instructions: Patient instructed to expect possible mild to moderate muscle soreness later today and/or tomorrow. Patient instructed in methods to reduce muscle soreness and to continue prescribed HEP. If patient was dry needled over the lung field, patient was instructed on signs and symptoms of pneumothorax and, however unlikely, to see immediate medical attention should they occur.  Patient was also educated on signs and symptoms of infection and to seek medical attention should they occur. Patient verbalized understanding of these instructions and education.    [facility] Adult PT Treatment:                                                DATE: 07/19/2023 Manual Therapy: Supine, bolster under knees: Bilateral unilateral PA glides to R / L T1-T5/R2-R5 Soft tissue mobilization R/L temporalis R/L masseter Clearing along R/L posterior mandible, R/L mastoid process, R/L nuchal line R/L cervical paraspinals Modalities: Supine, bolster under knees:  Moist hot pack to anterior trunk,  cold pack placed across forehead  [facility] Adult PT Treatment:                                                DATE: 07/11/23 Manual Therapy: Skilled palpation to identify taught and irritable bands in R suboccipitals and cervical multifidi Trigger Point Dry Needling Treatment: Pre-treatment instruction: Patient instructed on dry needling rationale, procedures, and possible side effects including pain during treatment (achy,cramping feeling), bruising, drop of blood, lightheadedness, nausea, sweating. Patient Consent Given: Yes Education handout provided: No Muscles treated: R suboccipitals and cervical multifidi  Needle size and number: .25x69mm x 2 Electrical stimulation performed: No Parameters: [name] Treatment response/outcome: Palpable decrease in muscle tension and increased cervical rotation post -technique Post-treatment instructions: Patient instructed to expect possible mild to moderate muscle soreness later today and/or tomorrow. Patient instructed in methods to reduce muscle soreness and to continue prescribed HEP. If patient was dry needled over the lung field, patient was instructed on signs and symptoms of pneumothorax and, however unlikely, to see immediate medical attention should they occur. Patient was also educated on signs and symptoms of infection and to seek medical attention should they occur. Patient verbalized understanding of these instructions and education.               SO release 5 min                                                                                                            DATE: 06/15/23 Eval   PATIENT EDUCATION:  Education details: Discussed eval findings, rehab rationale and POC and patient is in agreement  Person educated: Patient Education method: Explanation Education comprehension: verbalized understanding and needs further education  HOME EXERCISE PROGRAM: Access Code: [email or web address] URL: [email or web address] Date:  06/15/2023 Prepared by: Gustavus Bryant  Program Notes Just hold position for 5 min  Exercises - Supine Cervical Retraction with Towel  - 2-3 x daily - 5 x weekly - 1 sets -  5 min hold  ASSESSMENT:  CLINICAL IMPRESSION: Focus of session was manual techniques to mitigate soft tissue restrictions and tension.  Fair response to traction manually.  Due to soft tissue irritability elected to TPDN L UT only for B comparison.  Slight gains in R SB following technique   Decided to hold dry needling for today's session as patient's symptoms were elevated and cervical palpation was increasing nausea symptoms. Began treatment at the thoracic spine and rib cage to modulate symptoms which allowed for later treatment to the cranial musculature and cervical musculature without provoking the nausea symptoms. After manual treatments, finished with a moist hot pack along the anterior trunk and cold pack across the forehead for further headache relief. Patient was more comfortable lying on the plinth by the end of session, but headache symptoms increased again in transition from supine to sitting. Physical therapy remains indicated.  OBJECTIVE IMPAIRMENTS: decreased activity tolerance, decreased knowledge of condition, decreased ROM, decreased strength, increased fascial restrictions, increased muscle spasms, impaired flexibility, impaired UE functional use, postural dysfunction, obesity, and pain.   ACTIVITY LIMITATIONS: carrying, lifting, reach over head, and hygiene/grooming  PERSONAL FACTORS: Behavior pattern, Past/current experiences, Time since onset of injury/illness/exacerbation, and 1 comorbidity: prior CVAx3  are also affecting patient's functional outcome.   REHAB POTENTIAL: Fair based on previous unsuccessful conservative treatments  CLINICAL DECISION MAKING: Evolving/moderate complexity  EVALUATION COMPLEXITY: Moderate   GOALS: Goals reviewed with patient? No  SHORT TERM GOALS: Target date:  07/06/2023  Patient to demonstrate independence in HEP  Baseline: [email or web address] Goal status: INITIAL   LONG TERM GOALS: Target date: 07/27/2023    Increase FOTO score to 54 Baseline: 42 Goal status: INITIAL  2.  Increase all AROM c-spine to 75% Baseline:  Active ROM A/PROM (deg) eval  Flexion 75%P!  Extension 50%P!  Right lateral flexion 25%P!  Left lateral flexion 50%P!  Right rotation 50%P!  Left rotation 50%P!   Goal status: INITIAL  3.  Decrease worst pain to 6/10 Baseline: 10/10 Goal status: INITIAL  4.  Increase BUE Strength to 4/5 Baseline:  MMT Right eval Left eval  Shoulder flexion 4- 4-  Shoulder extension 4- 4-  Shoulder abduction 4- 4-   Goal status: INITIAL  5.  Increase cervical deep flexor endurance to 25s Baseline: 17s Goal status: INITIAL   PLAN:  PT FREQUENCY: 1-2x/week  PT DURATION: 6 weeks  PLANNED INTERVENTIONS: Therapeutic exercises, Therapeutic activity, Neuromuscular re-education, Balance training, Gait training, Patient/Family education, Self Care, Joint mobilization, DME instructions, Dry Needling, Electrical stimulation, Spinal mobilization, Cryotherapy, Moist heat, Traction, Manual therapy, and Re-evaluation  PLAN FOR NEXT SESSION: HEP review and update, manual techniques as appropriate, aerobic tasks, ROM and flexibility activities, strengthening and PREs, TPDN, gait and balance training as needed     Hildred Laser, PT 07/31/2023, 12:41 PM  Check all possible CPT codes: 32355 - PT Re-evaluation, 97110- Therapeutic Exercise, [phone]- Neuro Re-education, [phone] - Gait Training, [phone] - Manual Therapy, 97530 - Therapeutic Activities, 97535 - Self Care, [phone] - Mechanical traction, and [id number] - Electrical stimulation (unattended)    Check all conditions that are expected to impact treatment: {Conditions expected to impact treatment:Contractures, spasticity or fracture relevant to requested treatment and Neurological condition and/or  seizures   If treatment provided at initial evaluation, no treatment charged due to lack of authorization.

## 2023-08-01 ENCOUNTER — Ambulatory Visit: Payer: Medicaid Other

## 2023-08-01 ENCOUNTER — Other Ambulatory Visit: Payer: Self-pay | Admitting: Neurology

## 2023-08-01 VITALS — BP 145/86 | HR 69 | Temp 98.3°F | Resp 18 | Ht 65.5 in | Wt 191.6 lb

## 2023-08-01 DIAGNOSIS — G43019 Migraine without aura, intractable, without status migrainosus: Secondary | ICD-10-CM

## 2023-08-01 MED ORDER — SODIUM CHLORIDE 0.9 % IV SOLN
300.0000 mg | Freq: Once | INTRAVENOUS | Status: AC
  Administered 2023-08-01: 300 mg via INTRAVENOUS
  Filled 2023-08-01: qty 3

## 2023-08-01 MED ORDER — REYVOW 100 MG PO TABS
100.0000 mg | ORAL_TABLET | Freq: Every day | ORAL | 2 refills | Status: DC | PRN
  Filled 2023-08-01: qty 8, 30d supply, fill #0

## 2023-08-01 NOTE — Progress Notes (Signed)
Diagnosis: Intractable migraine without aura and without status migrainosus   Provider:  Chilton Greathouse MD  Procedure: IV Infusion  IV Type: Peripheral, IV Location: L Antecubital  Vyepti (Eptinezumab-jjmr), Dose: 300 mg  Infusion Start Time: 1011  Infusion Stop Time: 1045  Post Infusion IV Care: Peripheral IV Discontinued  Discharge: Condition: Good, Destination: Home . AVS Provided  Performed by:  Wyvonne Lenz, RN

## 2023-08-01 NOTE — Patient Instructions (Addendum)
 Patient Financial Services:  [phone]

## 2023-08-02 ENCOUNTER — Ambulatory Visit: Payer: Medicaid Other

## 2023-08-02 ENCOUNTER — Other Ambulatory Visit (HOSPITAL_COMMUNITY): Payer: Self-pay

## 2023-08-02 ENCOUNTER — Other Ambulatory Visit: Payer: Self-pay

## 2023-08-02 DIAGNOSIS — M6281 Muscle weakness (generalized): Secondary | ICD-10-CM

## 2023-08-02 DIAGNOSIS — M542 Cervicalgia: Secondary | ICD-10-CM

## 2023-08-08 NOTE — Therapy (Unsigned)
OUTPATIENT PHYSICAL THERAPY CERVICAL TREATMENT NOTE   Patient Name: Patricia [name] MRN: [id number] DOB:11/11/1965, 57 y.o., female Today's Date: 08/09/2023 PHYSICAL THERAPY DISCHARGE SUMMARY  Visits from Start of Care: 6  Current functional level related to goals / functional outcomes: Goals partially met   Remaining deficits: Pain, HA and loss of cervical mobility   Education / Equipment: HEP   Patient agrees to discharge. Patient goals were partially met. Patient is being discharged due to did not respond to therapy.   END OF SESSION:  PT End of Session - 08/09/23 1623     Visit Number 6    Number of Visits 6    Date for PT Re-Evaluation 08/15/23    Authorization Type MCD    PT Start Time 1623    PT Stop Time 1655    PT Time Calculation (min) 32 min    Activity Tolerance Patient limited by pain    Behavior During Therapy WFL for tasks assessed/performed                  Past Medical History:  Diagnosis Date   Anemia    in the past   Asthma    " a long time ago"   Bursitis    Depression    13 years ago -    Diabetes mellitus without complication (HCC)    type  2   GERD (gastroesophageal reflux disease)    occ. acid reflux   H/O: hysterectomy    Headache    migraines   Hypertension    Kidney stones    PFO (patent foramen ovale)    hx of PFO with surgery done as an adult   Rotator cuff tear    right   Stroke [facility]) 2009   lasdt stroke July 2015   Syncope and collapse    Past Surgical History:  Procedure Laterality Date   ABDOMINAL HYSTERECTOMY     BREAST REDUCTION SURGERY     CARDIAC CATHETERIZATION     CARDIAC SURGERY  Closed hole in heart in 2009   Amplatzer Septal Occluder Ref [id number] (not listed in op notes, document scanned under MRI with written op notes   HYSTEROTOMY     IR ANGIO INTRA EXTRACRAN SEL COM CAROTID INNOMINATE BILAT MOD SED  04/09/2018   IR ANGIO VERTEBRAL SEL SUBCLAVIAN INNOMINATE UNI R MOD SED  04/09/2018    IR GENERIC HISTORICAL  06/23/2016   IR ANGIO INTRA EXTRACRAN SEL COM CAROTID INNOMINATE BILAT MOD SED 06/23/2016 Julieanne Cotton, MD [facility]   IR GENERIC HISTORICAL  06/23/2016   IR ANGIO VERTEBRAL SEL VERTEBRAL BILAT MOD SED 06/23/2016 Julieanne Cotton, MD [facility]   IR GENERIC HISTORICAL  06/29/2016   IR RADIOLOGIST EVAL & MGMT 06/29/2016 [facility]   IR GENERIC HISTORICAL  07/24/2016   IR INTRA CRAN STENT 07/24/2016 Julieanne Cotton, MD [facility]   IR GENERIC HISTORICAL  07/28/2016   IR PTA INTRACRANIAL 07/28/2016 Julieanne Cotton, MD [facility]   IR GENERIC HISTORICAL  07/28/2016   IR ENDOVASC INTRACRANIAL INF OTHER THAN THROMBO ART INC DIAG ANGIO 07/28/2016 Julieanne Cotton, MD [facility]   IR GENERIC HISTORICAL  08/07/2016   IR RADIOLOGIST EVAL & MGMT 08/07/2016 [facility]   IR GENERIC HISTORICAL  12/05/2016   IR ANGIO INTRA EXTRACRAN SEL COM CAROTID INNOMINATE BILAT MOD SED 12/05/2016 Julieanne Cotton, MD [facility]   IR GENERIC HISTORICAL  12/05/2016   IR ANGIO VERTEBRAL SEL SUBCLAVIAN INNOMINATE UNI R MOD  SED 12/05/2016 Julieanne Cotton, MD [facility]   IR GENERIC HISTORICAL  12/05/2016   IR ANGIO VERTEBRAL SEL VERTEBRAL UNI L MOD SED 12/05/2016 Julieanne Cotton, MD [facility]   RADIOLOGY WITH ANESTHESIA [name] 07/24/2016   Procedure: Stenting;  Surgeon: Julieanne Cotton, MD;  Location: [facility];  Service: Radiology;  Laterality: [name];   RADIOLOGY WITH ANESTHESIA [name] 07/28/2016   Procedure: RADIOLOGY WITH ANESTHESIA;  Surgeon: Medication Radiologist, MD;  Location: [facility];  Service: Radiology;  Laterality: [name];   REDUCTION MAMMAPLASTY Bilateral 2000   SHOULDER ARTHROSCOPY WITH OPEN ROTATOR CUFF REPAIR AND DISTAL CLAVICLE ACROMINECTOMY Right 02/04/2015   Procedure: SHOULDER ARTHROSCOPY WITH SUBACROMIAL DECOMPRESSION AND OPEN ROTATOR CUFF REPAIR,;  Surgeon: Cammy Copa, MD;  Location: [facility];  Service: Orthopedics;  Laterality: Right;  RIGHT SHOULDER  DOA,SAD,MINI-OPEN ROTATOR CUFF TEAR REPAIR.   Patient Active Problem List   Diagnosis Date Noted   Cervicalgia 07/11/2023   Facet arthropathy, cervical 05/09/2023   Intractable migraine without aura and without status migrainosus 05/17/2022   Renal insufficiency 03/10/2021   Chronic daily headache 06/19/2017   Intractable chronic migraine without aura and with status migrainosus 09/15/2016   Depression 08/23/2016   Cerebrovascular accident (CVA) due to occlusion of left middle cerebral artery (HCC) 07/28/2016   Acute respiratory failure (HCC)    Middle cerebral artery stenosis, left 07/24/2016   Headache 03/23/2016   Type 2 diabetes mellitus with complication, with long-term current use of insulin (HCC) 09/08/2015   Hyperglycemia    TIA (transient ischemic attack)    AKI (acute kidney injury) (HCC) 02/20/2015   ARF (acute renal failure) (HCC) 02/20/2015   Rotator cuff tear 02/04/2015   Occipital neuralgia 08/27/2014   Type 2 diabetes mellitus with complication (HCC) 08/27/2014   PFO (patent foramen ovale) 06/17/2014   Hyperlipidemia 06/17/2014   Morbid obesity, unspecified obesity type (HCC) 06/17/2014   Acute CVA (cerebrovascular accident) (HCC) 05/22/2014   Non-compliance 05/22/2014   History of cardioembolic stroke 05/22/2014   Status post device closure of ASD 05/22/2014   HTN (hypertension), benign 05/22/2014   DM type 2 (diabetes mellitus, type 2) (HCC) 05/22/2014    PCP: Salli Real, MD   REFERRING PROVIDER: Ranelle Oyster, MD  REFERRING DIAG: M54.81 (ICD-10-CM) - Bilateral occipital neuralgia M47.812 (ICD-10-CM) - Facet arthropathy, cervical  THERAPY DIAG:  Cervicalgia  Muscle weakness (generalized)  Rationale for Evaluation and Treatment: Rehabilitation  ONSET DATE: chronic  SUBJECTIVE:  SUBJECTIVE STATEMENT:  Unchanged symptoms todate, PT interventions and TPDN have not helped symptoms  Hand dominance: Right  PERTINENT HISTORY:  Right sided cervicalgia/occipital tenderness. Has DDD at C3-4 with left foraminal stenosis on MRI as well as multi-level facet arthropathy. If this referred pain from her neck, the C2-3 facets on right might be the most likely source.  I do not believe the left-sided foraminal stenosis is contributing to her pain as her symptoms are on the right side. Chronic migraines Hx of left MCA infarct ?  Depression/anxiety.  Her neck and head are so sensitive that I feel that there almost is a central component to this pain.  PAIN:  Are you having pain? Yes: NPRS scale: 10/10 Pain location: head and neck Pain description: HA and neck pain Aggravating factors: scents, weather Relieving factors: undetermined  PRECAUTIONS: None  RED FLAGS: None     WEIGHT BEARING RESTRICTIONS: No  FALLS:  Has patient fallen in last 6 months? No  OCCUPATION: medical assistant  PLOF: Independent  PATIENT GOALS: To reduce and manage my neck symptoms  NEXT MD VISIT: September  OBJECTIVE:   DIAGNOSTIC FINDINGS:  Salli Real, MD   PATIENT SURVEYS:  FOTO 42(54 predicted) : 08/09/23 53  POSTURE: rounded shoulders and depressed shoulders R>L  PALPATION: Globally tender to B upper traps, scalenes, levators and cervical paracervicals.   CERVICAL ROM:   Active ROM A/PROM (deg) eval AROM 07/26/23 AROM 08/09/23  Flexion 75%P!  50%  Extension 50%P! 25% 25%  Right lateral flexion 25%P! 10% 25%  Left lateral flexion 50%P! 75% 75%  Right rotation 50%P! 75% 25%  Left rotation 50%P! 50% 50%   (Blank rows = not tested)  UPPER EXTREMITY ROM: WFL but painful  Active ROM Right eval Left eval  Shoulder flexion    Shoulder extension    Shoulder abduction    Shoulder adduction    Shoulder extension    Shoulder internal rotation     Shoulder external rotation    Elbow flexion    Elbow extension    Wrist flexion    Wrist extension    Wrist ulnar deviation    Wrist radial deviation    Wrist pronation    Wrist supination     (Blank rows = not tested)  UPPER EXTREMITY MMT: deficits related to pain  MMT Right eval Left eval R 08/09/23 L  08/09/23  Shoulder flexion 4- 4- 4- 4  Shoulder extension 4- 4- 4- 4  Shoulder abduction 4- 4- 4- 4  Shoulder adduction      Shoulder extension      Shoulder internal rotation      Shoulder external rotation      Middle trapezius      Lower trapezius      Elbow flexion      Elbow extension      Wrist flexion      Wrist extension      Wrist ulnar deviation      Wrist radial deviation      Wrist pronation      Wrist supination      Grip strength       (Blank rows = not tested)  CERVICAL SPECIAL TESTS:  Neck flexor muscle endurance test: Positive 17s  FUNCTIONAL TESTS:  5 times sit to stand: 21s arms crossed  TODAY'S TREATMENT:      [facility] Adult PT Treatment:  DATE: 08/01/23  Manual Therapy: Skilled palpation to identify taught and irritable bands in B UT and scalene muscles groups L OA mobilization L side only 3x10 with improved mobility and less discomfroet Trigger Point Dry Needling Treatment: Pre-treatment instruction: Patient instructed on dry needling rationale, procedures, and possible side effects including pain during treatment (achy,cramping feeling), bruising, drop of blood, lightheadedness, nausea, sweating. Patient Consent Given: Yes Education handout provided: No Muscles treated: B UT  Needle size and number: .25x45mm x 2 Electrical stimulation performed: No Parameters: [name] Treatment response/outcome: Twitch response elicited and Palpable decrease in muscle tension Post-treatment instructions: Patient instructed to expect possible mild to moderate muscle soreness later today and/or tomorrow. Patient  instructed in methods to reduce muscle soreness and to continue prescribed HEP. If patient was dry needled over the lung field, patient was instructed on signs and symptoms of pneumothorax and, however unlikely, to see immediate medical attention should they occur. Patient was also educated on signs and symptoms of infection and to seek medical attention should they occur. Patient verbalized understanding of these instructions and education.    [facility] Adult PT Treatment:                                                DATE: 07/26/23  Manual Therapy: SO traction avoiding nuchal line 8 min duration Skilled palpation to identify taught and irritable bands in LUT Trigger Point Dry Needling Treatment: Pre-treatment instruction: Patient instructed on dry needling rationale, procedures, and possible side effects including pain during treatment (achy,cramping feeling), bruising, drop of blood, lightheadedness, nausea, sweating. Patient Consent Given: Yes Education handout provided: No Muscles treated: L UT   Needle size and number: .25x59mm x 1 Electrical stimulation performed: No Parameters: [name] Treatment response/outcome: Twitch response elicited and Palpable decrease in muscle tension Post-treatment instructions: Patient instructed to expect possible mild to moderate muscle soreness later today and/or tomorrow. Patient instructed in methods to reduce muscle soreness and to continue prescribed HEP. If patient was dry needled over the lung field, patient was instructed on signs and symptoms of pneumothorax and, however unlikely, to see immediate medical attention should they occur. Patient was also educated on signs and symptoms of infection and to seek medical attention should they occur. Patient verbalized understanding of these instructions and education.    [facility] Adult PT Treatment:                                                DATE: 07/19/2023 Manual Therapy: Supine, bolster under knees: Bilateral  unilateral PA glides to R / L T1-T5/R2-R5 Soft tissue mobilization R/L temporalis R/L masseter Clearing along R/L posterior mandible, R/L mastoid process, R/L nuchal line R/L cervical paraspinals Modalities: Supine, bolster under knees:  Moist hot pack to anterior trunk, cold pack placed across forehead  [facility] Adult PT Treatment:                                                DATE: 07/11/23 Manual Therapy: Skilled palpation to identify taught and irritable bands in R suboccipitals and cervical multifidi Trigger Point Dry Needling  Treatment: Pre-treatment instruction: Patient instructed on dry needling rationale, procedures, and possible side effects including pain during treatment (achy,cramping feeling), bruising, drop of blood, lightheadedness, nausea, sweating. Patient Consent Given: Yes Education handout provided: No Muscles treated: R suboccipitals and cervical multifidi  Needle size and number: .25x19mm x 2 Electrical stimulation performed: No Parameters: [name] Treatment response/outcome: Palpable decrease in muscle tension and increased cervical rotation post -technique Post-treatment instructions: Patient instructed to expect possible mild to moderate muscle soreness later today and/or tomorrow. Patient instructed in methods to reduce muscle soreness and to continue prescribed HEP. If patient was dry needled over the lung field, patient was instructed on signs and symptoms of pneumothorax and, however unlikely, to see immediate medical attention should they occur. Patient was also educated on signs and symptoms of infection and to seek medical attention should they occur. Patient verbalized understanding of these instructions and education.               SO release 5 min                                                                                                            DATE: 06/15/23 Eval   PATIENT EDUCATION:  Education details: Discussed eval findings, rehab rationale and POC  and patient is in agreement  Person educated: Patient Education method: Explanation Education comprehension: verbalized understanding and needs further education  HOME EXERCISE PROGRAM: Access Code: [email or web address] URL: [email or web address] Date: 06/15/2023 Prepared by: Gustavus Bryant  Program Notes Just hold position for 5 min  Exercises - Supine Cervical Retraction with Towel  - 2-3 x daily - 5 x weekly - 1 sets -  5 min hold  ASSESSMENT:  CLINICAL IMPRESSION: See goal section for progress.  FOTO goal met but remaining goals unmet.  Unable to alter patient symptoms with a variety of exercises and manual interventions   Decided to hold dry needling for today's session as patient's symptoms were elevated and cervical palpation was increasing nausea symptoms. Began treatment at the thoracic spine and rib cage to modulate symptoms which allowed for later treatment to the cranial musculature and cervical musculature without provoking the nausea symptoms. After manual treatments, finished with a moist hot pack along the anterior trunk and cold pack across the forehead for further headache relief. Patient was more comfortable lying on the plinth by the end of session, but headache symptoms increased again in transition from supine to sitting. Physical therapy remains indicated.  OBJECTIVE IMPAIRMENTS: decreased activity tolerance, decreased knowledge of condition, decreased ROM, decreased strength, increased fascial restrictions, increased muscle spasms, impaired flexibility, impaired UE functional use, postural dysfunction, obesity, and pain.   ACTIVITY LIMITATIONS: carrying, lifting, reach over head, and hygiene/grooming  PERSONAL FACTORS: Behavior pattern, Past/current experiences, Time since onset of injury/illness/exacerbation, and 1 comorbidity: prior CVAx3  are also affecting patient's functional outcome.   REHAB POTENTIAL: Fair based on previous unsuccessful conservative  treatments  CLINICAL DECISION MAKING: Evolving/moderate complexity  EVALUATION COMPLEXITY: Moderate   GOALS: Goals reviewed with patient? No  SHORT TERM GOALS: Target  date: 07/06/2023  Patient to demonstrate independence in HEP  Baseline: [email or web address] Goal status: Met   LONG TERM GOALS: Target date: 07/27/2023    Increase FOTO score to 54 Baseline: 42; 08/09/23 53 Goal status: Met   2.  Increase all AROM c-spine to 75% Baseline:  Active ROM A/PROM (deg) eval AROM 07/26/23 AROM 08/09/23  Flexion 75%P!  50%  Extension 50%P! 25% 25%  Right lateral flexion 25%P! 10% 25%  Left lateral flexion 50%P! 75% 75%  Right rotation 50%P! 75% 25%  Left rotation 50%P! 50% 50%   Goal status: Not met  3.  Decrease worst pain to 6/10 Baseline: 10/10; 08/09/23 8/10 average Goal status: Not met  4.  Increase BUE Strength to 4/5 Baseline:  MMT Right eval Left eval R 08/09/23 L  08/09/23  Shoulder flexion 4- 4- 4- 4  Shoulder extension 4- 4- 4- 4  Shoulder abduction 4- 4- 4- 4   Goal status: Partially met  5.  Increase cervical deep flexor endurance to 25s Baseline: 17s; 08/09/23 25s Goal status: Met   PLAN:  PT FREQUENCY: 1-2x/week  PT DURATION: 6 weeks  PLANNED INTERVENTIONS: Therapeutic exercises, Therapeutic activity, Neuromuscular re-education, Balance training, Gait training, Patient/Family education, Self Care, Joint mobilization, DME instructions, Dry Needling, Electrical stimulation, Spinal mobilization, Cryotherapy, Moist heat, Traction, Manual therapy, and Re-evaluation  PLAN FOR NEXT SESSION: HEP review and update, manual techniques as appropriate, aerobic tasks, ROM and flexibility activities, strengthening and PREs, TPDN, gait and balance training as needed     Hildred Laser, PT 08/09/2023, 4:42 PM  Check all possible CPT codes: 16109 - PT Re-evaluation, 97110- Therapeutic Exercise, [phone]- Neuro Re-education, [phone] - Gait Training, [phone] - Manual Therapy,  97530 - Therapeutic Activities, 97535 - Self Care, [phone] - Mechanical traction, and [id number] - Electrical stimulation (unattended)    Check all conditions that are expected to impact treatment: {Conditions expected to impact treatment:Contractures, spasticity or fracture relevant to requested treatment and Neurological condition and/or seizures   If treatment provided at initial evaluation, no treatment charged due to lack of authorization.

## 2023-08-09 ENCOUNTER — Ambulatory Visit: Payer: Medicaid Other

## 2023-08-09 DIAGNOSIS — M542 Cervicalgia: Secondary | ICD-10-CM | POA: Diagnosis not present

## 2023-08-09 DIAGNOSIS — M6281 Muscle weakness (generalized): Secondary | ICD-10-CM

## 2023-09-05 ENCOUNTER — Encounter: Payer: Self-pay | Admitting: Neurology

## 2023-09-12 ENCOUNTER — Encounter: Payer: Medicaid Other | Admitting: Physical Medicine & Rehabilitation

## 2023-09-26 ENCOUNTER — Other Ambulatory Visit (HOSPITAL_COMMUNITY): Payer: Self-pay

## 2023-09-26 NOTE — Progress Notes (Signed)
NEUROLOGY FOLLOW UP OFFICE NOTE  Patricia [name] [id number]  Assessment/Plan:   Chronic migraine without aura, without status migrainosus, not intractable Right-sided occipital neuralgia. Cervical spondylosis   Unfortunately, I do not have anything else to offer [name].  I recommended referral to the headache clinic at [facility].  She is without insurance at this time.  I have refilled her topiramate and promethazine each with 1 more refill and will provide her with Reyvow samples if we have any.  We will discontinue Vyepti.  Subjective:  Patricia [name] is a 57 year old right-handed female with HTN, diabetes, HLD and history of stroke who follows up for migraines.   UPDATE: Started Vyepti 300mg  status post 2 infusions. She saw pain management.  They prescribed PT for neck pain which was not effective.   Insurance lapsed, so she cannot afford Vyepti and Reyvow.  Has infusion on 1/9.  Will see about getting financial assistance.    She has not noticed any improvement. Intensity:  moderate to severe Duration: 1 day with Reyvow.  Now that she cannot afford Reyvow, she is not treating the migraines with any analgesics.  Takes occasional hydrocodone which she has for her back, but it doesn't help with the headache.   Frequency:  daily headache but has a migraine 15 days a month.  Of note, she tripped a couple of days ago and fell on her right side.  She feels pain in the right lower back, hip and thigh.    Current NSAIDS:  none Current  analgesics:  hydrocodone Current triptans:  Contraindicated (CVA) Current ergotamine:  Contraindicated (CVA) Current anti-emetic:  Promethazine 25mg  Current muscle relaxants:  Tizanidine 4mg  at bedtime Current anti-anxiolytic:  none Current sleep aide:  none Current Antihypertensive medications:  lisinopril Current Antidepressant medications:  Lexapro Current Anticonvulsant medications:  topiramate 100mg  daily Current  anti-CGRP:  Vyepti 300mg   Current Vitamins/Herbal/Supplements:  Magnesium oxide 400mg  daily, CoQ-10 Current Antihistamines/Decongestants:  Diphenhydramine PRN Other therapy:  Reyvow Hormone/birth control:  none   Caffeine:  1 cup of coffee once a week at most Alcohol:  rarely Smoker:  no Diet:  Hydrates Exercise:  no Depression:  mild; Anxiety:  Mild.  Currently without a job and needed to move in with her daughter. 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  Other pertinent stroke workup included negative vasculitis and hypercoagulable panel.  She has had recurrent episodes of speech difficulty and dragging of her right foot.  Repeat MRIs and EEG were negative.  Hemiplegic migraines suspected.   She started having migraines at 57 years old.  They may be a severe pounding pain that occurs behind right eye or right occipital region.  They are associated with nausea, vomiting, osmophobia, dizziness and sometimes visual aura (objects in vision seem tilted) as well as right facial weakness and speech disturbance.  They typically last 2 days to 1 week.  20 headache days a month on average.  Triggers include certain smells (scented lotions) or change in weather.  No relieving factors.  She has been followed by headache specialist at [facility] as well as at [facility] but treatment has been ineffective.   On 03/10/2021, she woke up in the middle of the night with headache and right lower  extremity weakness, typical of her hemiplegic migraine.  Later that day, she presented to the hospital where she was admitted for stroke.  CT head was negative for acute findings.  Follow up MRI of brain was negative for acute findings.  Treated as migraine with cocktail of IV Depakote, Reglan,  Benadryl and Ativan.  Due to ongoing posterior headache and neck pain despite treatment, she had an MRI of cervical spine on 12/13/2022 evealed multilevel spondylosis most notable at C3-4 where there is left foraminal impingement and spinal stenosis with mild cord flattening as well as degenerative facet spurring on the right but patent on the right.   Takes a pain reliever once a week on-average (ineffective)   Past NSAIDS:  Ibuprofen, naproxen Past analgesics:  Percocet, Tylenol, Excedrin, tramadol, acetaminophen-diphenhdramine Past abortive triptans:  Sumatriptan (side effects) Past abortive ergotamine:  none Past muscle relaxants:  none Past anti-emetic:  none Past antihypertensive medications:  Propranolol (ineffective) Past antidepressant medications:  Amitriptyline (ineffective), venlafaxine (ineffective), sertraline 50mg  Past anticonvulsant medications: Depakote (ineffective), gabapentin (ineffective), Keppra (ineffective), Lamictal (ineffective), Lyrica (ineffective), zonisamide 100mg  daily Past anti-CGRP:  Aimovig, Emgality, Ubrelvy, Nurtec Qulipta 60mg  Past vitamins/Herbal/Supplements:  none Past antihistamines/decongestants:  none Other past therapies:  Botox, occipital nerve block, Reyvow      Family history of headache:  Son, grandson   Imaging: 05/22/2014 MRI/MRA of head: Multiple acute infarcts in left hemisphere.  Old infarctions affecting the thalami, hemispheric white matter, genu of corpus callosum and right parietal cortex at the vertex.  Advanced intracranial disease, particularly severe at the M1 segment of left MCA showing 90% stenosis. 07/03/2014 CTA Head & Neck: Severe proximal left MCA stenosis.  Mild proximal right MCA stenosis.  Bilateral MCA branch vessel irregularity and attenuation.  Occlusion of the anterior cerebral arteries at their origins with some reconstituted but severely diminished flow distally.  Mild left PT stenosis.  No evidence of cervical carotid  or vertebral artery stenosis. 02/2015 MRI/MRA of head: No acute intracranial process.  Multifocal small areas of encephalomalacia corresponding to prior left cerebral remote infarcts.  Remote bilateral basal ganglia and thalamus lacunar infarcts.  Moderate chronic small vessel ischemic changes.  Focal high-grade stenosis of left M1 segment, stable.  Chronically occluded right anterior cerebral artery with thready irregular left anterior cerebral artery, unchanged.  Thready, somewhat narrowed basilar artery, worse than prior exam though this may be accentuated by motion artifact.  Moderate stenosis left PT segment, worse than prior imaging.  Improved appearance of left middle cerebral artery from prior imaging. 01/05/2016 MRI Brain: No acute or subacute infarction.  Old infarctions affecting the thalami, basal ganglia, genu of the corpus callosum, hemispheric white matter and left and right cortical and subcortical infarctions at the vertex. 07/29/2016 MRI Brain wo: Patchy multifocal acute ischemic predominantly cortical left MCA territory infarcts. 07/28/2016 CT head/CT perfusion/CTA head and Neck: Possible nonocclusive thrombus in left MCA stent.  Abnormally prolonged transit throughout the left MCA superior division without evidence of acute core infarct or large vessel occlusion.  Chronically occluded or severely diseased A1 segments.  Mild right M1 MCA stenosis. 11/2016 Cerebral angiogram: Approximately 70-75% stenosis within the midportion of the stented segment of left MCA most likely secondary to combination of near intimal hyperplasia and atherosclerotic plaque.  Hypoplastic right anterior cerebral artery A1 segment.  Nonvisualization of left anterior cerebral artery A1 segment.  Dominant posterior communicating arteries bilaterally. 06/07/2017 MRI and MRA of head: No acute intracranial abnormality or abnormal enhancement.  Small chronic infarctions in left  frontal centrum semiovale and bilateral caudate  heads.  Mild chronic small vessel ischemic changes.  Long segment flow-void of left M1 corresponding to intra-arterial stent.  Bilateral M2 and distal MCA circulation symmetric.  Hypoplastic right A1, nonvisualized left A1, and poor downstream flow related to signal in the bilateral anterior cerebral arteries, similar to prior cerebral angiogram.  Intracranial atherosclerosis with areas of stable mild to moderate stenosis in the anterior and posterior circulation. 12/20/2017 MRI/MRA of head: MRI of the brain unchanged.  Left MCA stent with associated signal loss.  50% stenosis of right M1 distally, unchanged.  Nonvisualization of either anterior cerebral artery A1 segments. 03/06/2018 MRI Brain wo: No acute intracranial abnormality.  Stable compared to prior imaging. 03/2018 Cerebral angiogram: Stable IntraStent stenosis of 75-80% within previously treated left middle cerebral artery severe stenosis with stent assisted angioplasty.  No significant change compared to prior study of February 2018.  PAST MEDICAL HISTORY: Past Medical History:  Diagnosis Date   Anemia    in the past   Asthma    " a long time ago"   Bursitis    Depression    13 years ago -    Diabetes mellitus without complication (HCC)    type  2   GERD (gastroesophageal reflux disease)    occ. acid reflux   H/O: hysterectomy    Headache    migraines   Hypertension    Kidney stones    PFO (patent foramen ovale)    hx of PFO with surgery done as an adult   Rotator cuff tear    right   Stroke [facility]) 2009   lasdt stroke July 2015   Syncope and collapse     MEDICATIONS: Current Outpatient Medications on File Prior to Visit  Medication Sig Dispense Refill   BRILINTA 90 MG TABS tablet Take 90 mg by mouth 2 (two) times daily.   2   cetirizine (ZYRTEC) 10 MG tablet Take 10 mg by mouth at bedtime.     chlorthalidone (HYGROTON) 25 MG tablet Take 25 mg by mouth at bedtime.   5   Cholecalciferol (VITAMIN D-3) 25 MCG (1000 UT) CAPS  Take 1,000 Units by mouth at bedtime.     Eptinezumab-jjmr (VYEPTI) 100 MG/ML injection Vyepti 300mg ,every 3 months administered IV over 30 minutes 3 mL 4   HYDROcodone-acetaminophen (NORCO/VICODIN) 5-325 MG tablet Take 1 tablet by mouth every 4 (four) hours as needed. (Patient taking differently: Take 1 tablet by mouth every 4 (four) hours as needed (for pain).) 10 tablet 0   JARDIANCE 25 MG TABS tablet Take 25 mg by mouth at bedtime.     KERENDIA 10 MG TABS Take 1 tablet by mouth daily.     Lasmiditan Succinate (REYVOW) 100 MG TABS Take 1 tablet (100 mg total) by mouth daily as needed. 16 tablet 2   lisinopril (ZESTRIL) 5 MG tablet Take 5 mg by mouth daily.     OZEMPIC, 0.25 OR 0.5 MG/DOSE, 2 MG/1.5ML SOPN Inject into the skin.     pravastatin (PRAVACHOL) 80 MG tablet Take 80 mg by mouth at bedtime.      promethazine (PHENERGAN) 25 MG tablet TAKE 1 TABLET(25 MG) BY MOUTH EVERY 4 HOURS AS NEEDED FOR NAUSEA OR VOMITING Strength: 25 mg 30 tablet 2   tiZANidine (ZANAFLEX) 4 MG tablet Take 4 mg by mouth every 6 (six) hours as needed for muscle spasms.     topiramate (TOPAMAX) 100 MG tablet TAKE 1 TABLET BY MOUTH EVERY  NIGHT 180 tablet 1   No current facility-administered medications on file prior to visit.    ALLERGIES: Allergies  Allergen Reactions   Imitrex [Sumatriptan] Anaphylaxis   Triptans Anaphylaxis and Hives   Plavix [Clopidogrel Bisulfate] Hives   Zofran [Ondansetron] Nausea Only   Cymbalta [Duloxetine Hcl] Nausea Only   Lipitor [Atorvastatin] Rash   Topamax [Topiramate] Other (See Comments)    Memory issues    FAMILY HISTORY: Family History  Problem Relation Age of Onset   Hypertension Mother    Hypertension Father    Cancer Maternal Aunt    Breast cancer Maternal Aunt    Cancer Maternal Uncle       Objective:  Blood pressure 135/80, pulse 67, height 5\' 5"  (1.651 m), weight 191 lb (86.6 kg), SpO2 98%.. General: No acute distress.  Patient appears well-groomed.      Shon Millet, DO  CC: Salli Real, MD

## 2023-09-28 ENCOUNTER — Ambulatory Visit (INDEPENDENT_AMBULATORY_CARE_PROVIDER_SITE_OTHER): Payer: Self-pay | Admitting: Neurology

## 2023-09-28 ENCOUNTER — Encounter: Payer: Self-pay | Admitting: Neurology

## 2023-09-28 DIAGNOSIS — G43019 Migraine without aura, intractable, without status migrainosus: Secondary | ICD-10-CM

## 2023-09-28 DIAGNOSIS — G43409 Hemiplegic migraine, not intractable, without status migrainosus: Secondary | ICD-10-CM

## 2023-09-28 MED ORDER — PROMETHAZINE HCL 25 MG PO TABS: ORAL_TABLET | ORAL | 1 refills | Status: DC

## 2023-09-28 MED ORDER — TOPIRAMATE 100 MG PO TABS: ORAL_TABLET | ORAL | 1 refills | Status: DC

## 2023-09-28 NOTE — Patient Instructions (Signed)
I would recommend going to the headache clinic at [facility].  When you get insurance, please let me know and I can refer you

## 2023-10-09 ENCOUNTER — Other Ambulatory Visit (HOSPITAL_BASED_OUTPATIENT_CLINIC_OR_DEPARTMENT_OTHER): Payer: Self-pay

## 2023-10-15 ENCOUNTER — Other Ambulatory Visit: Payer: Self-pay

## 2023-10-31 ENCOUNTER — Telehealth: Payer: Self-pay | Admitting: Pharmacy Technician

## 2023-10-31 NOTE — Telephone Encounter (Signed)
 Patricia [name],  Patient is un-insured and will need Vyepti free drug program.  Valentina Shaggy will be sending the forms for MD signature to fax number: [phone]. Once patient is approved we will schedule her as soon as possible.  Thanks Selena Batten

## 2023-11-01 ENCOUNTER — Ambulatory Visit: Payer: Medicaid Other

## 2023-11-05 NOTE — Telephone Encounter (Signed)
 Forms faxed over today. Dr.Jaffe out of the office last week.

## 2023-11-05 NOTE — Telephone Encounter (Signed)
 Sheena, Thanks

## 2023-11-07 NOTE — Telephone Encounter (Signed)
 Patricia [name], Patient has been approved for Vyepti  (free drug). Once medication arrives we will schedule patient as soon as possible.  Auth Submission: APPROVED Site of care: Site of care: [facility] Payer: free drug [employer] Medication & CPT/J Code(s) submitted: Vyepti  (Eptinezumab ) [id number] Route of submission (phone, fax, portal): [phone] Phone # Fax # Auth type: free drug Units/visits requested: 300mg  q3 months Reference number: [id number] Approval from: 11/06/23 to 11/05/24

## 2023-11-09 ENCOUNTER — Other Ambulatory Visit: Payer: Self-pay

## 2023-11-09 NOTE — Progress Notes (Signed)
Disenrolling - Patient approved for patient assistance for Vyepti per 1.15.25 chart notes.

## 2024-03-24 ENCOUNTER — Ambulatory Visit: Payer: Self-pay | Admitting: Nurse Practitioner

## 2024-04-02 ENCOUNTER — Ambulatory Visit: Payer: Self-pay | Admitting: Family Medicine

## 2024-05-16 ENCOUNTER — Other Ambulatory Visit: Payer: Self-pay | Admitting: Medical Genetics

## 2024-05-22 ENCOUNTER — Telehealth: Payer: Self-pay | Admitting: Pharmacy Technician

## 2024-05-22 ENCOUNTER — Encounter: Payer: Self-pay | Admitting: Neurology

## 2024-05-22 ENCOUNTER — Other Ambulatory Visit (HOSPITAL_COMMUNITY): Payer: Self-pay

## 2024-05-22 NOTE — Telephone Encounter (Signed)
 Pharmacy Patient Advocate Encounter   Received notification from CoverMyMeds that prior authorization for REYVOW  100MG  is required/requested.   Insurance verification completed.   The patient is insured through [facility] .   Per test claim: PA required; PA submitted to above mentioned insurance via CoverMyMeds Key/confirmation #/EOC [id number] Status is pending

## 2024-05-22 NOTE — Telephone Encounter (Signed)
 Pharmacy Patient Advocate Encounter  Received notification from [facility] that Prior Authorization for REYVOW  100MG  has been APPROVED from 7.31.25 to 7.31.26. Ran test claim, Copay is $25. This test claim was processed through [facility]- copay amounts may vary at other pharmacies due to pharmacy/plan contracts, or as the patient moves through the different stages of their insurance plan.   PA #/Case ID/Reference #: [id number]

## 2024-05-23 ENCOUNTER — Encounter: Payer: Self-pay | Admitting: Neurology

## 2024-05-23 ENCOUNTER — Ambulatory Visit (INDEPENDENT_AMBULATORY_CARE_PROVIDER_SITE_OTHER): Payer: Self-pay | Admitting: Family Medicine

## 2024-05-23 VITALS — BP 102/70 | HR 66 | Temp 98.9°F | Wt 214.0 lb

## 2024-05-23 DIAGNOSIS — M545 Low back pain, unspecified: Secondary | ICD-10-CM

## 2024-05-23 DIAGNOSIS — Z90710 Acquired absence of both cervix and uterus: Secondary | ICD-10-CM

## 2024-05-23 DIAGNOSIS — Z23 Encounter for immunization: Secondary | ICD-10-CM | POA: Diagnosis not present

## 2024-05-23 DIAGNOSIS — G43019 Migraine without aura, intractable, without status migrainosus: Secondary | ICD-10-CM

## 2024-05-23 DIAGNOSIS — R110 Nausea: Secondary | ICD-10-CM

## 2024-05-23 DIAGNOSIS — Z7689 Persons encountering health services in other specified circumstances: Secondary | ICD-10-CM

## 2024-05-23 DIAGNOSIS — E1149 Type 2 diabetes mellitus with other diabetic neurological complication: Secondary | ICD-10-CM | POA: Diagnosis not present

## 2024-05-23 DIAGNOSIS — E559 Vitamin D deficiency, unspecified: Secondary | ICD-10-CM

## 2024-05-23 DIAGNOSIS — E785 Hyperlipidemia, unspecified: Secondary | ICD-10-CM | POA: Diagnosis not present

## 2024-05-23 DIAGNOSIS — Z1159 Encounter for screening for other viral diseases: Secondary | ICD-10-CM

## 2024-05-23 DIAGNOSIS — I10 Essential (primary) hypertension: Secondary | ICD-10-CM | POA: Diagnosis not present

## 2024-05-23 DIAGNOSIS — E1169 Type 2 diabetes mellitus with other specified complication: Secondary | ICD-10-CM | POA: Diagnosis not present

## 2024-05-23 LAB — POCT URINALYSIS DIP (CLINITEK)
Bilirubin, UA: NEGATIVE
Glucose, UA: 500 mg/dL — AB
Ketones, POC UA: NEGATIVE mg/dL
Leukocytes, UA: NEGATIVE
Nitrite, UA: NEGATIVE
POC PROTEIN,UA: NEGATIVE
Spec Grav, UA: 1.015 (ref 1.010–1.025)
Urobilinogen, UA: 0.2 U/dL
pH, UA: 5.5 (ref 5.0–8.0)

## 2024-05-23 MED ORDER — PROMETHAZINE HCL 25 MG PO TABS: ORAL_TABLET | ORAL | 0 refills | Status: AC

## 2024-05-23 NOTE — Patient Instructions (Signed)
 Hypertension, Adult Hypertension is another name for high blood pressure. High blood pressure forces your heart to work harder to pump blood. This can cause problems over time. There are two numbers in a blood pressure reading. There is a top number (systolic) over a bottom number (diastolic). It is best to have a blood pressure that is below 120/80. What are the causes? The cause of this condition is not known. Some other conditions can lead to high blood pressure. What increases the risk? Some lifestyle factors can make you more likely to develop high blood pressure: Smoking. Not getting enough exercise or physical activity. Being overweight. Having too much fat, sugar, calories, or salt (sodium) in your diet. Drinking too much alcohol. Other risk factors include: Having any of these conditions: Heart disease. Diabetes. High cholesterol. Kidney disease. Obstructive sleep apnea. Having a family history of high blood pressure and high cholesterol. Age. The risk increases with age. Stress. What are the signs or symptoms? High blood pressure may not cause symptoms. Very high blood pressure (hypertensive crisis) may cause: Headache. Fast or uneven heartbeats (palpitations). Shortness of breath. Nosebleed. Vomiting or feeling like you may vomit (nauseous). Changes in how you see. Very bad chest pain. Feeling dizzy. Seizures. How is this treated? This condition is treated by making healthy lifestyle changes, such as: Eating healthy foods. Exercising more. Drinking less alcohol. Your doctor may prescribe medicine if lifestyle changes do not help enough and if: Your top number is above 130. Your bottom number is above 80. Your personal target blood pressure may vary. Follow these instructions at home: Eating and drinking  If told, follow the DASH eating plan. To follow this plan: Fill one half of your plate at each meal with fruits and vegetables. Fill one fourth of your plate  at each meal with whole grains. Whole grains include whole-wheat pasta, brown rice, and whole-grain bread. Eat or drink low-fat dairy products, such as skim milk or low-fat yogurt. Fill one fourth of your plate at each meal with low-fat (lean) proteins. Low-fat proteins include fish, chicken without skin, eggs, beans, and tofu. Avoid fatty meat, cured and processed meat, or chicken with skin. Avoid pre-made or processed food. Limit the amount of salt in your diet to less than 1,500 mg each day. Do not drink alcohol if: Your doctor tells you not to drink. You are pregnant, may be pregnant, or are planning to become pregnant. If you drink alcohol: Limit how much you have to: 0-1 drink a day for women. 0-2 drinks a day for men. Know how much alcohol is in your drink. In the [place] one drink equals one 12 oz bottle of beer (355 mL), one 5 oz glass of wine (148 mL), or one 1 oz glass of hard liquor (44 mL). Lifestyle  Work with your doctor to stay at a healthy weight or to lose weight. Ask your doctor what the best weight is for you. Get at least 30 minutes of exercise that causes your heart to beat faster (aerobic exercise) most days of the week. This may include walking, swimming, or biking. Get at least 30 minutes of exercise that strengthens your muscles (resistance exercise) at least 3 days a week. This may include lifting weights or doing Pilates. Do not smoke or use any products that contain nicotine or tobacco. If you need help quitting, ask your doctor. Check your blood pressure at home as told by your doctor. Keep all follow-up visits. Medicines Take over-the-counter and prescription medicines  only as told by your doctor. Follow directions carefully. Do not skip doses of blood pressure medicine. The medicine does not work as well if you skip doses. Skipping doses also puts you at risk for problems. Ask your doctor about side effects or reactions to medicines that you should watch  for. Contact a doctor if: You think you are having a reaction to the medicine you are taking. You have headaches that keep coming back. You feel dizzy. You have swelling in your ankles. You have trouble with your vision. Get help right away if: You get a very bad headache. You start to feel mixed up (confused). You feel weak or numb. You feel faint. You have very bad pain in your: Chest. Belly (abdomen). You vomit more than once. You have trouble breathing. These symptoms may be an emergency. Get help right away. Call 911. Do not wait to see if the symptoms will go away. Do not drive yourself to the hospital. Summary Hypertension is another name for high blood pressure. High blood pressure forces your heart to work harder to pump blood. For most people, a normal blood pressure is less than 120/80. Making healthy choices can help lower blood pressure. If your blood pressure does not get lower with healthy choices, you may need to take medicine. This information is not intended to replace advice given to you by your health care provider. Make sure you discuss any questions you have with your health care provider. Document Revised: 07/28/2021 Document Reviewed: 07/28/2021 Elsevier Patient Education  2024 [employer].

## 2024-05-23 NOTE — Progress Notes (Signed)
 I,Patricia [name], CMA,acting as a Neurosurgeon for [employer], NP.,have documented all relevant documentation on the behalf of Patricia Creighton, NP,as directed by  Patricia Creighton, NP while in the presence of Patricia Creighton, NP.  Subjective:  Patient ID: Patricia [name] , female    DOB: 10-Jan-1966 , 58 y.o.   MRN: [id number]  Chief Complaint  Patient presents with   Establish Care    Patient presents today to establish pcp. She reports she last seen her pcp last month.    Hypertension    She would like to be seen for her bp. Patient reports compliance with her meds. Patient reports she has been having headaches.    Discussed the use of AI scribe software for clinical note transcription with the patient, who gave verbal consent to proceed.  History of Present Illness     HPI  Patricia [name] is a 58 year old female with diabetes, hypertension, and a history of strokes who presents to establish care.  She has a history of diabetes and is currently taking Jardiance  25 mg. She was previously on Ozempic but ran out due to insurance issues. She does not recall her last A1c level.  She has hypertension and is taking pravastatin  for cholesterol management.  She has experienced three strokes, with the last one occurring in 2017. She was previously followed by neurology for headaches but is not currently seeing a neurologist. She was on topiramate  for headaches and had tried infusions, which were discontinued due to insurance issues. She has not had recent neurology follow-up.  She reports swelling in her right leg, more than the left, typically worsening by the end of the day. She manages this by elevating her leg.  She experiences irregular bowel movements, typically every three days, but had diarrhea starting last night.  She had a total hysterectomy in 2003 due to a cyst on her ovary and has not seen a gynecologist since. She also had breast reduction surgery in 2000 and rotator cuff surgery  in 2016.  She is taking Lexapro, which was prescribed for pain management related to headaches, but it has not been effective.She has a history of chronic back pain amongst others and was seen by Dr Austin , [facility] for years but some had to leave the practice for insurance issues.  Will refer to pain management.      Past Medical History:  Diagnosis Date   Anemia    in the past   Asthma     a long time ago   Bursitis    Depression    13 years ago -    Diabetes mellitus without complication (HCC)    type  2   GERD (gastroesophageal reflux disease)    occ. acid reflux   H/O: hysterectomy    Headache    migraines   Hypertension    Kidney stones    PFO (patent foramen ovale)    hx of PFO with surgery done as an adult   Rotator cuff tear    right   Stroke [facility]) 2009   lasdt stroke July 2015   Syncope and collapse      Family History  Problem Relation Age of Onset   Hypertension Mother    Hypertension Father    Cancer Maternal Aunt    Breast cancer Maternal Aunt    Cancer Maternal Uncle      Current Outpatient Medications:    BRILINTA  90 MG TABS tablet, Take 90 mg  by mouth 2 (two) times daily. , Disp: , Rfl: 2   cetirizine (ZYRTEC) 10 MG tablet, Take 10 mg by mouth at bedtime., Disp: , Rfl:    chlorthalidone  (HYGROTON ) 25 MG tablet, Take 25 mg by mouth at bedtime. , Disp: , Rfl: 5   Cholecalciferol (VITAMIN D -3) 25 MCG (1000 UT) CAPS, Take 1,000 Units by mouth at bedtime., Disp: , Rfl:    escitalopram (LEXAPRO) 10 MG tablet, Take 10 mg by mouth daily., Disp: , Rfl:    HYDROcodone -acetaminophen  (NORCO/VICODIN) 5-325 MG tablet, Take 1 tablet by mouth every 4 (four) hours as needed., Disp: 10 tablet, Rfl: 0   JARDIANCE  25 MG TABS tablet, Take 25 mg by mouth at bedtime., Disp: , Rfl:    KERENDIA 10 MG TABS, Take 1 tablet by mouth daily., Disp: , Rfl:    lisinopril (ZESTRIL) 5 MG tablet, Take 5 mg by mouth daily., Disp: , Rfl:    pravastatin  (PRAVACHOL ) 80 MG  tablet, Take 80 mg by mouth at bedtime. , Disp: , Rfl:    tiZANidine  (ZANAFLEX ) 4 MG tablet, Take 4 mg by mouth every 6 (six) hours as needed for muscle spasms., Disp: , Rfl:    topiramate  (TOPAMAX ) 25 MG capsule, Take 25 mg by mouth 2 (two) times daily., Disp: , Rfl:    Eptinezumab -jjmr (VYEPTI ) 100 MG/ML injection, Vyepti  300mg ,every 3 months administered IV over 30 minutes (Patient not taking: Reported on 05/23/2024), Disp: 3 mL, Rfl: 4   OZEMPIC, 0.25 OR 0.5 MG/DOSE, 2 MG/1.5ML SOPN, Inject into the skin. (Patient not taking: Reported on 05/23/2024), Disp: , Rfl:    promethazine  (PHENERGAN ) 25 MG tablet, TAKE 1 TABLET(25 MG) BY MOUTH EVERY 4 HOURS AS NEEDED FOR NAUSEA OR VOMITING Strength: 25 mg, Disp: 30 tablet, Rfl: 0   topiramate  (TOPAMAX ) 100 MG tablet, TAKE 1 TABLET BY MOUTH EVERY NIGHT (Patient not taking: Reported on 05/23/2024), Disp: 30 tablet, Rfl: 1   Allergies  Allergen Reactions   Imitrex [Sumatriptan] Anaphylaxis   Triptans Anaphylaxis and Hives   Plavix  [Clopidogrel  Bisulfate] Hives   Zofran  [Ondansetron ] Nausea Only   Cymbalta  [Duloxetine  Hcl] Nausea Only   Lipitor [Atorvastatin ] Rash   Topamax  [Topiramate ] Other (See Comments)    Memory issues     Review of Systems  Constitutional: Negative.   Eyes: Negative.   Respiratory: Negative.    Cardiovascular: Negative.   Musculoskeletal:  Positive for back pain.  Skin: Negative.   Neurological:  Positive for headaches.  Hematological: Negative.   Psychiatric/Behavioral: Negative.       Today's Vitals   05/23/24 1043  BP: 102/70  Pulse: 66  Temp: 98.9 F (37.2 C)  Weight: 214 lb (97.1 kg)  PainSc: 8   PainLoc: Head   Body mass index is 35.61 kg/m.  Wt Readings from Last 3 Encounters:  05/23/24 214 lb (97.1 kg)  09/28/23 191 lb (86.6 kg)  08/01/23 191 lb 9.6 oz (86.9 kg)    The ASCVD Risk score (Arnett DK, et al., 2019) failed to calculate for the following reasons:   Risk score cannot be calculated because  patient has a medical history suggesting prior/existing ASCVD  Objective:  Physical Exam Cardiovascular:     Rate and Rhythm: Regular rhythm. Bradycardia present.  Pulmonary:     Effort: Pulmonary effort is normal.     Breath sounds: Normal breath sounds.  Neurological:     General: No focal deficit present.     Mental Status: She is alert and oriented to  person, place, and time.  Psychiatric:        Mood and Affect: Mood normal.        Behavior: Behavior normal.         Assessment And Plan:  Establishing care with new doctor, encounter for  HTN (hypertension), benign -     POCT URINALYSIS DIP (CLINITEK) -     Microalbumin / creatinine urine ratio -     EKG 12-Lead -     CBC -     CMP14+EGFR  Immunization due -     Tdap vaccine greater than or equal to 7yo IM -     Pneumococcal conjugate vaccine 20-valent  Encounter for hepatitis C screening test for low risk patient -     Hepatitis C antibody  Intractable migraine without aura and without status migrainosus Assessment & Plan: Chronic migraines previously managed with infusions and topiramate . Infusions discontinued due to insurance and inefficacy at lower doses.    History of total hysterectomy Assessment & Plan: 2003 per patient   Hyperlipidemia associated with type 2 diabetes mellitus (HCC) -     Lipid panel  Type 2 diabetes mellitus with other neurologic complication, without long-term current use of insulin  [facility]) Assessment & Plan: Managed with Jardiance  25 mg. Off Ozempic due to insurance, resulting in weight gain. - Check A1c.  Orders: -     Hemoglobin A1c  Vitamin D  deficiency -     VITAMIN D  25 Hydroxy (Vit-D Deficiency, Fractures)  Lumbar pain -     Ambulatory referral to Pain Clinic  Nausea -     Promethazine  HCl; TAKE 1 TABLET(25 MG) BY MOUTH EVERY 4 HOURS AS NEEDED FOR NAUSEA OR VOMITING Strength: 25 mg  Dispense: 30 tablet; Refill: 0   Assessment and Plan Assessment & Plan Type 2  diabetes mellitus  - Evaluate need to increase Jardiance  or add new medication.  Hypertension Mentioned without specific management details.  Migraine Neurologist not seen recently. Reval authorization pending.  Hyperlipidemia Managed with pravastatin .  Depression denies any. Mentioned in context of taking Lexapro for pain management related to headaches.   Return in about 4 months (around 09/22/2024) for diabetes, physical.  Patient was given opportunity to ask questions. Patient verbalized understanding of the plan and was able to repeat key elements of the plan. All questions were answered to their satisfaction.    I, Patricia Creighton, NP, have reviewed all documentation for this visit. The documentation on 05/28/2024 for the exam, diagnosis, procedures, and orders are all accurate and complete.   IF YOU HAVE BEEN REFERRED TO A SPECIALIST, IT MAY TAKE 1-2 WEEKS TO SCHEDULE/PROCESS THE REFERRAL. IF YOU HAVE NOT HEARD FROM US /SPECIALIST IN TWO WEEKS, PLEASE GIVE US  A CALL AT [phone] X [phone].

## 2024-05-24 LAB — CMP14+EGFR
ALT: 31 IU/L (ref 0–32)
AST: 30 IU/L (ref 0–40)
Albumin: 4.2 g/dL (ref 3.8–4.9)
Alkaline Phosphatase: 165 IU/L — ABNORMAL HIGH (ref 44–121)
BUN/Creatinine Ratio: 23 (ref 9–23)
BUN: 47 mg/dL — ABNORMAL HIGH (ref 6–24)
Bilirubin Total: <0.2 mg/dL (ref 0.0–1.2)
CO2: 19 mmol/L — ABNORMAL LOW (ref 20–29)
Calcium: 9.2 mg/dL (ref 8.7–10.2)
Chloride: 108 mmol/L — ABNORMAL HIGH (ref 96–106)
Creatinine, Ser: 2.01 mg/dL — ABNORMAL HIGH (ref 0.57–1.00)
Globulin, Total: 2.9 g/dL (ref 1.5–4.5)
Glucose: 187 mg/dL — ABNORMAL HIGH (ref 70–99)
Potassium: 4.6 mmol/L (ref 3.5–5.2)
Sodium: 143 mmol/L (ref 134–144)
Total Protein: 7.1 g/dL (ref 6.0–8.5)
eGFR: 28 mL/min/1.73 — ABNORMAL LOW (ref >59)

## 2024-05-24 LAB — LIPID PANEL
Chol/HDL Ratio: 2.2 ratio (ref 0.0–4.4)
Cholesterol, Total: 175 mg/dL (ref 100–199)
HDL: 80 mg/dL (ref >39)
LDL Chol Calc (NIH): 83 mg/dL (ref 0–99)
Triglycerides: 59 mg/dL (ref 0–149)
VLDL Cholesterol Cal: 12 mg/dL (ref 5–40)

## 2024-05-24 LAB — CBC
Hematocrit: 41.9 % (ref 34.0–46.6)
Hemoglobin: 12.8 g/dL (ref 11.1–15.9)
MCH: 27.9 pg (ref 26.6–33.0)
MCHC: 30.5 g/dL — ABNORMAL LOW (ref 31.5–35.7)
MCV: 91 fL (ref 79–97)
Platelets: 181 x10E3/uL (ref 150–450)
RBC: 4.59 x10E6/uL (ref 3.77–5.28)
RDW: 12.6 % (ref 11.7–15.4)
WBC: 6.5 x10E3/uL (ref 3.4–10.8)

## 2024-05-24 LAB — MICROALBUMIN / CREATININE URINE RATIO
Creatinine, Urine: 79.2 mg/dL
Microalb/Creat Ratio: 5 mg/g{creat} (ref 0–29)
Microalbumin, Urine: 3.6 ug/mL

## 2024-05-24 LAB — HEPATITIS C ANTIBODY: Hep C Virus Ab: NONREACTIVE

## 2024-05-24 LAB — HEMOGLOBIN A1C
Est. average glucose Bld gHb Est-mCnc: 154 mg/dL
Hgb A1c MFr Bld: 7 % — ABNORMAL HIGH (ref 4.8–5.6)

## 2024-05-24 LAB — VITAMIN D 25 HYDROXY (VIT D DEFICIENCY, FRACTURES): Vit D, 25-Hydroxy: 15.6 ng/mL — ABNORMAL LOW (ref 30.0–100.0)

## 2024-05-26 ENCOUNTER — Other Ambulatory Visit: Payer: Self-pay | Admitting: Family Medicine

## 2024-05-26 DIAGNOSIS — Z1231 Encounter for screening mammogram for malignant neoplasm of breast: Secondary | ICD-10-CM

## 2024-05-28 ENCOUNTER — Other Ambulatory Visit (HOSPITAL_COMMUNITY): Payer: Self-pay

## 2024-05-28 ENCOUNTER — Ambulatory Visit: Payer: Self-pay | Admitting: Family Medicine

## 2024-05-28 ENCOUNTER — Other Ambulatory Visit: Payer: Self-pay | Admitting: Family Medicine

## 2024-05-28 DIAGNOSIS — E559 Vitamin D deficiency, unspecified: Secondary | ICD-10-CM | POA: Insufficient documentation

## 2024-05-28 DIAGNOSIS — R110 Nausea: Secondary | ICD-10-CM | POA: Insufficient documentation

## 2024-05-28 DIAGNOSIS — Z1159 Encounter for screening for other viral diseases: Secondary | ICD-10-CM | POA: Insufficient documentation

## 2024-05-28 DIAGNOSIS — M545 Low back pain, unspecified: Secondary | ICD-10-CM | POA: Insufficient documentation

## 2024-05-28 DIAGNOSIS — Z1231 Encounter for screening mammogram for malignant neoplasm of breast: Secondary | ICD-10-CM

## 2024-05-28 DIAGNOSIS — Z7689 Persons encountering health services in other specified circumstances: Secondary | ICD-10-CM | POA: Insufficient documentation

## 2024-05-28 DIAGNOSIS — I129 Hypertensive chronic kidney disease with stage 1 through stage 4 chronic kidney disease, or unspecified chronic kidney disease: Secondary | ICD-10-CM | POA: Insufficient documentation

## 2024-05-28 DIAGNOSIS — Z23 Encounter for immunization: Secondary | ICD-10-CM | POA: Insufficient documentation

## 2024-05-28 MED ORDER — VITAMIN D (ERGOCALCIFEROL) 1.25 MG (50000 UNIT) PO CAPS
50000.0000 [IU] | ORAL_CAPSULE | ORAL | 0 refills | Status: DC
  Filled 2024-05-28: qty 8, 56d supply, fill #0

## 2024-05-28 NOTE — Assessment & Plan Note (Signed)
 Managed with Jardiance  25 mg. Off Ozempic due to insurance, resulting in weight gain. - Check A1c.

## 2024-05-28 NOTE — Assessment & Plan Note (Addendum)
 Chronic migraines previously managed with infusions and topiramate . Infusions discontinued due to insurance and inefficacy at lower doses.

## 2024-05-28 NOTE — Assessment & Plan Note (Signed)
 2003 per patient

## 2024-05-28 NOTE — Progress Notes (Signed)
 Your A1c is 7 and that is fairly controlled because the goal is 7 or less but my concern is your kidney function. The labs shows that you have severe chronic kidney disease. Do you see a kidney doctor already? Because I am referring you to one right away. Also your vitamin D  is low, once weekly vitamin d  supplement has been sent to the pharmacy for you. Thanks!

## 2024-05-31 ENCOUNTER — Other Ambulatory Visit (HOSPITAL_COMMUNITY): Payer: Self-pay

## 2024-06-06 ENCOUNTER — Encounter

## 2024-06-06 DIAGNOSIS — Z1231 Encounter for screening mammogram for malignant neoplasm of breast: Secondary | ICD-10-CM

## 2024-06-11 ENCOUNTER — Encounter

## 2024-06-11 DIAGNOSIS — Z1231 Encounter for screening mammogram for malignant neoplasm of breast: Secondary | ICD-10-CM

## 2024-06-25 ENCOUNTER — Other Ambulatory Visit: Payer: Self-pay | Admitting: Family Medicine

## 2024-06-25 DIAGNOSIS — R110 Nausea: Secondary | ICD-10-CM

## 2024-06-25 NOTE — Telephone Encounter (Unsigned)
 Copied from CRM [id number]. Topic: Clinical - Medication Refill >> Jun 25, 2024  9:06 AM Edsel HERO wrote: Medication:  topiramate  (TOPAMAX ) 25 MG capsule promethazine  (PHENERGAN ) 25 MG tablet  Has the patient contacted their pharmacy? No  This is the patient's preferred pharmacy:   [id number] - [place], [place] - [address] AT [facility] OF [address] [place]  [address] Phone: [phone] Fax: [phone]   Is this the correct pharmacy for this prescription? Yes If no, delete pharmacy and type the correct one.   Has the prescription been filled recently? Yes  Is the patient out of the medication? Yes  Has the patient been seen for an appointment in the last year OR does the patient have an upcoming appointment? Yes  Can we respond through MyChart? Yes

## 2024-07-03 ENCOUNTER — Other Ambulatory Visit: Payer: Self-pay | Admitting: Family Medicine

## 2024-07-03 NOTE — Telephone Encounter (Signed)
 Patient called to f/u refill for opiramate (TOPAMAX ) 25 MG capsule promethazine  (PHENERGAN ) 25 MG tablet. Please f/u with patient as the refill request was sent on 9/3

## 2024-07-03 NOTE — Telephone Encounter (Unsigned)
 Copied from CRM #[id number]. Topic: Clinical - Medication Refill >> Jul 03, 2024 12:45 PM Myrick T wrote: Patient is taking her last dosage today.  Medication: BRILINTA  90 MG TABS tablet  Has the patient contacted their pharmacy? Yes  This is the patient's preferred pharmacy:  [id number] - [place], [place] - [address] AT [facility] OF [address] [place] [address] Phone: [phone] Fax: [phone]  Is this the correct pharmacy for this prescription? Yes If no, delete pharmacy and type the correct one.   Has the prescription been filled recently? Yes  Is the patient out of the medication? Yes  Has the patient been seen for an appointment in the last year OR does the patient have an upcoming appointment? Yes  Can we respond through MyChart? Yes  Agent: Please be advised that Rx refills may take up to 3 business days. We ask that you follow-up with your pharmacy.

## 2024-07-30 ENCOUNTER — Encounter: Payer: Self-pay | Admitting: Neurology

## 2024-07-31 DIAGNOSIS — E119 Type 2 diabetes mellitus without complications: Secondary | ICD-10-CM | POA: Diagnosis not present

## 2024-07-31 DIAGNOSIS — H2513 Age-related nuclear cataract, bilateral: Secondary | ICD-10-CM | POA: Diagnosis not present

## 2024-07-31 LAB — OPHTHALMOLOGY REPORT-SCANNED

## 2024-08-14 ENCOUNTER — Other Ambulatory Visit (HOSPITAL_COMMUNITY)
Admission: RE | Admit: 2024-08-14 | Discharge: 2024-08-14 | Disposition: A | Discharge status: Home / Self Care | Payer: Self-pay | Source: Ambulatory Visit | Attending: Medical Genetics | Admitting: Medical Genetics

## 2024-08-22 LAB — GENECONNECT MOLECULAR SCREEN: Genetic Analysis Overall Interpretation: NEGATIVE

## 2024-08-29 ENCOUNTER — Encounter: Payer: Self-pay | Admitting: Family Medicine

## 2024-09-01 ENCOUNTER — Other Ambulatory Visit: Payer: Self-pay | Admitting: Family Medicine

## 2024-09-01 MED ORDER — BRILINTA 90 MG PO TABS: 90.0000 mg | ORAL_TABLET | Freq: Two times a day (BID) | ORAL | 2 refills | Status: DC

## 2024-09-01 NOTE — Telephone Encounter (Signed)
 Copied from CRM [id number]. Topic: Clinical - Medication Refill >> Sep 01, 2024  2:31 PM Kevelyn M wrote: Medication: BRILINTA  90 MG TABS tablet  Has the patient contacted their pharmacy? Yes (Agent: If no, request that the patient contact the pharmacy for the refill. If patient does not wish to contact the pharmacy document the reason why and proceed with request.) (Agent: If yes, when and what did the pharmacy advise?)  This is the patient's preferred pharmacy:  [id number] - [place], [place] - [address] AT [facility] OF [address] [place] [address] Phone: [phone] Fax: [phone]  Is this the correct pharmacy for this prescription? Yes If no, delete pharmacy and type the correct one.   Has the prescription been filled recently? No  Is the patient out of the medication? No  Has the patient been seen for an appointment in the last year OR does the patient have an upcoming appointment? Yes  Can we respond through MyChart? Yes  Agent: Please be advised that Rx refills may take up to 3 business days. We ask that you follow-up with your pharmacy.

## 2024-09-02 ENCOUNTER — Other Ambulatory Visit: Payer: Self-pay | Admitting: Family Medicine

## 2024-09-02 NOTE — Telephone Encounter (Unsigned)
 Copied from CRM [id number]. Topic: Clinical - Medication Refill >> Sep 02, 2024  3:38 PM Fonda T wrote: Medication: KERENDIA 10 MG TABS  (Requesting generic form)   Has the patient contacted their pharmacy? Yes (Agent: If no, request that the patient contact the pharmacy for the refill. If patient does not wish to contact the pharmacy document the reason why and proceed with request.) (Agent: If yes, when and what did the pharmacy advise?)  This is the patient's preferred pharmacy:   [id number] - [place], [place] - [address] AT [facility] OF [address] [place] [address] Phone: [phone] Fax: [phone]   Is this the correct pharmacy for this prescription? Yes If no, delete pharmacy and type the correct one.   Has the prescription been filled recently? Yes  Is the patient out of the medication? Yes  Has the patient been seen for an appointment in the last year OR does the patient have an upcoming appointment? Yes  Can we respond through MyChart? Yes  Agent: Please be advised that Rx refills may take up to 3 business days. We ask that you follow-up with your pharmacy.

## 2024-09-03 ENCOUNTER — Other Ambulatory Visit: Payer: Self-pay

## 2024-09-03 ENCOUNTER — Other Ambulatory Visit (HOSPITAL_COMMUNITY): Payer: Self-pay

## 2024-09-03 MED ORDER — KERENDIA 10 MG PO TABS
1.0000 | ORAL_TABLET | Freq: Every day | ORAL | 1 refills | Status: DC
  Filled 2024-09-03: qty 30, 30d supply, fill #0
  Filled 2024-09-11: qty 60, 60d supply, fill #0
  Filled 2024-09-25: qty 30, 30d supply, fill #0

## 2024-09-11 ENCOUNTER — Other Ambulatory Visit (HOSPITAL_COMMUNITY): Payer: Self-pay

## 2024-09-11 ENCOUNTER — Encounter: Payer: Self-pay | Admitting: Family Medicine

## 2024-09-11 ENCOUNTER — Other Ambulatory Visit: Payer: Self-pay | Admitting: Family Medicine

## 2024-09-11 ENCOUNTER — Telehealth (HOSPITAL_COMMUNITY): Payer: Self-pay

## 2024-09-11 ENCOUNTER — Ambulatory Visit: Payer: Self-pay

## 2024-09-11 ENCOUNTER — Encounter: Payer: Self-pay | Admitting: Neurology

## 2024-09-11 NOTE — Telephone Encounter (Signed)
 Patient states that insurance will only cover if prescribed as generic

## 2024-09-11 NOTE — Telephone Encounter (Signed)
 Pharmacy Patient Advocate Encounter   Received notification from Pt Calls Messages that prior authorization for Kerendia 10 mg tablets is required/requested.   Insurance verification completed.   The patient is insured through [employer].   Per test claim: Per test claim, medication is not covered due to plan/benefit exclusion, PA not submitted at this time

## 2024-09-11 NOTE — Telephone Encounter (Signed)
 Medication: Kerendia 10 mg Able to fill? No Prior authorization required? Yes Co-pay before assistance: n/a

## 2024-09-11 NOTE — Telephone Encounter (Signed)
 Medication: kerendia 10 mg Able to fill? No Prior authorization required? Yes Co-pay before assistance: n/a

## 2024-09-11 NOTE — Telephone Encounter (Signed)
  FYI Only or Action Required?: FYI only for provider: appointment scheduled on 09/12/2024.  Patient was last seen in primary care on 05/23/2024 by [name], NP.  Called Nurse Triage reporting Nasal Congestion and Headache.  Symptoms began a week ago.  Interventions attempted: OTC medications: zyrtec.  Symptoms are: gradually worsening.  Triage Disposition: See PCP When Office is Open (Within 3 Days)  Patient/caregiver understands and will follow disposition?: Yes Reason for Triage: pt called because she has sinus infection with yellow mucous. Call back number [phone]   Reason for Disposition  [1] Sinus congestion (pressure, fullness) AND [2] present > 10 days  Answer Assessment - Initial Assessment Questions Has been taking zyrtec  1. LOCATION: Where does it hurt?      Behind eyes 2. ONSET: When did the sinus pain start?  (e.g., hours, days)      More than one week ago 3. SEVERITY: How bad is the pain?   (Scale 0-10; or none, mild, moderate or severe)     8/10  5. NASAL CONGESTION: Is the nose blocked? If Yes, ask: Can you open it or must you breathe through your mouth?     intermittent 6. NASAL DISCHARGE: Do you have discharge from your nose? If so ask, What color?     Yellow runny nose 7. FEVER: Do you have a fever? If Yes, ask: What is it, how was it measured, and when did it start?      denies 8. OTHER SYMPTOMS: Do you have any other symptoms? (e.g., sore throat, cough, earache, difficulty breathing)     denies  Protocols used: Sinus Pain or Congestion-A-AH

## 2024-09-12 ENCOUNTER — Encounter: Payer: Self-pay | Admitting: Family Medicine

## 2024-09-12 ENCOUNTER — Other Ambulatory Visit (HOSPITAL_COMMUNITY): Payer: Self-pay

## 2024-09-12 ENCOUNTER — Ambulatory Visit (INDEPENDENT_AMBULATORY_CARE_PROVIDER_SITE_OTHER): Payer: Medicare (Managed Care) | Admitting: Family Medicine

## 2024-09-12 ENCOUNTER — Telehealth (HOSPITAL_COMMUNITY): Payer: Self-pay

## 2024-09-12 VITALS — BP 130/92 | HR 60 | Wt 222.6 lb

## 2024-09-12 DIAGNOSIS — R0981 Nasal congestion: Secondary | ICD-10-CM | POA: Diagnosis not present

## 2024-09-12 DIAGNOSIS — J0100 Acute maxillary sinusitis, unspecified: Secondary | ICD-10-CM | POA: Diagnosis not present

## 2024-09-12 MED ORDER — AMOXICILLIN-POT CLAVULANATE 875-125 MG PO TABS: 1.0000 | ORAL_TABLET | Freq: Two times a day (BID) | ORAL | 0 refills | Status: DC

## 2024-09-12 NOTE — Progress Notes (Signed)
 Name: Patricia [name]   Date of Visit: 09/12/24   Date of last visit with me: Visit date not found   CHIEF COMPLAINT:  Chief Complaint  Patient presents with   Establish Care    Sinus congestion, sinus pain. Drainage, yellow/green. Been taking allergy medicine twice a day. No fever.        HPI:  Discussed the use of AI scribe software for clinical note transcription with the patient, who gave verbal consent to proceed.  History of Present Illness   Patricia [name] is a 58 year old female with a history of seasonal allergies who presents with nasal congestion and headache.  She has been experiencing symptoms for the past week, including headache, eye pain, nasal congestion, and a mild cough. No fever has been noted. She received both the COVID and flu vaccines approximately three weeks ago.  Her history of seasonal allergies typically occurs in the spring, for which she takes Zyrtec. Her nasal symptoms alternate between rhinorrhea and nasal obstruction, making it difficult to breathe. She occasionally uses Flonase nasal spray to manage her symptoms.  Initially, her nasal drainage was clear, but it has since turned yellow-green and was blood-tinged this morning. She has a history of sinus infections occurring once or twice a year, which are usually treated successfully with antibiotics.         OBJECTIVE:       05/23/2024   10:49 AM  Depression screen PHQ 2/9  Decreased Interest 0  Down, Depressed, Hopeless 0  PHQ - 2 Score 0  Altered sleeping 0  Tired, decreased energy 0  Change in appetite 0  Feeling bad or failure about yourself  0  Trouble concentrating 0  Moving slowly or fidgety/restless 0  Suicidal thoughts 0  PHQ-9 Score 0   Difficult doing work/chores Not difficult at all     Data saved with a previous flowsheet row definition     BP Readings from Last 3 Encounters:  09/12/24 (!) 130/92  05/23/24 102/70  09/28/23 135/80    BP (!) 130/92    Pulse 60   Wt 222 lb 9.6 oz (101 kg)   SpO2 97%   BMI 37.04 kg/m    Physical Exam          Physical Exam Constitutional:      Appearance: Normal appearance.  HENT:     Right Ear: Tympanic membrane, ear canal and external ear normal.     Left Ear: Tympanic membrane, ear canal and external ear normal.     Nose: Congestion and rhinorrhea present.     Mouth/Throat:     Pharynx: Posterior oropharyngeal erythema present. No oropharyngeal exudate.  Eyes:     General:        Right eye: No discharge.  Musculoskeletal:     Cervical back: Normal range of motion and neck supple.  Neurological:     General: No focal deficit present.     Mental Status: She is alert and oriented to person, place, and time. Mental status is at baseline.     ASSESSMENT/PLAN:   Assessment & Plan Sinus congestion  Acute non-recurrent maxillary sinusitis    Assessment and Plan    Acute maxillary sinusitis with nasal congestion Recurrent sinusitis with purulent drainage and facial pain. Opted for antibiotics due to diabetes. - Prescribed Augmentin , sent to [facility] on [address]. - Advised daily Flonase use. - Recommended Mucinex for congestion relief.  - Discusssed usage of steroids with patient if  need further relief but givne patients hx of diabetes will hold off  Type 2 Diabetes - Given hx of diabetes, will go ahead with abx a tthis time. 1 week hx of worsening sinusitis. Clear colored drainag.e         Taffy Delconte A. Vita MD [facility]

## 2024-09-16 ENCOUNTER — Encounter: Payer: Self-pay | Admitting: Neurology

## 2024-09-16 MED ORDER — TICAGRELOR 90 MG PO TABS: 90.0000 mg | ORAL_TABLET | Freq: Two times a day (BID) | ORAL | 2 refills | Status: AC

## 2024-09-24 ENCOUNTER — Ambulatory Visit: Payer: Medicare (Managed Care) | Admitting: Family Medicine

## 2024-09-24 ENCOUNTER — Other Ambulatory Visit: Payer: Self-pay | Admitting: Family Medicine

## 2024-09-24 ENCOUNTER — Ambulatory Visit
Admission: RE | Admit: 2024-09-24 | Discharge: 2024-09-24 | Disposition: A | Payer: Medicare (Managed Care) | Source: Ambulatory Visit | Attending: Family Medicine | Admitting: Family Medicine

## 2024-09-24 VITALS — BP 140/80 | HR 61 | Temp 98.2°F | Ht 65.0 in | Wt 225.0 lb

## 2024-09-24 DIAGNOSIS — Z1231 Encounter for screening mammogram for malignant neoplasm of breast: Secondary | ICD-10-CM

## 2024-09-24 DIAGNOSIS — E785 Hyperlipidemia, unspecified: Secondary | ICD-10-CM | POA: Diagnosis not present

## 2024-09-24 DIAGNOSIS — E1149 Type 2 diabetes mellitus with other diabetic neurological complication: Secondary | ICD-10-CM | POA: Diagnosis not present

## 2024-09-24 DIAGNOSIS — N184 Chronic kidney disease, stage 4 (severe): Secondary | ICD-10-CM

## 2024-09-24 DIAGNOSIS — I129 Hypertensive chronic kidney disease with stage 1 through stage 4 chronic kidney disease, or unspecified chronic kidney disease: Secondary | ICD-10-CM

## 2024-09-24 DIAGNOSIS — M545 Low back pain, unspecified: Secondary | ICD-10-CM | POA: Diagnosis not present

## 2024-09-24 DIAGNOSIS — Z8673 Personal history of transient ischemic attack (TIA), and cerebral infarction without residual deficits: Secondary | ICD-10-CM | POA: Diagnosis not present

## 2024-09-24 DIAGNOSIS — E1169 Type 2 diabetes mellitus with other specified complication: Secondary | ICD-10-CM | POA: Diagnosis not present

## 2024-09-24 DIAGNOSIS — E66812 Obesity, class 2: Secondary | ICD-10-CM | POA: Diagnosis not present

## 2024-09-24 DIAGNOSIS — E1122 Type 2 diabetes mellitus with diabetic chronic kidney disease: Secondary | ICD-10-CM | POA: Diagnosis not present

## 2024-09-24 DIAGNOSIS — Z6837 Body mass index (BMI) 37.0-37.9, adult: Secondary | ICD-10-CM | POA: Diagnosis not present

## 2024-09-24 MED ORDER — LISINOPRIL 10 MG PO TABS: 10.0000 mg | ORAL_TABLET | Freq: Every day | ORAL | 1 refills | Status: DC

## 2024-09-24 NOTE — Assessment & Plan Note (Signed)
 A1c at 7, indicating fairly controlled diabetes.  - Resume Ozempic once weekly.

## 2024-09-24 NOTE — Progress Notes (Signed)
 I,Jameka J Llittleton, CMA,acting as a neurosurgeon for [employer], NP.,have documented all relevant documentation on the behalf of [name], NP,as directed by  [name], NP while in the presence of [name], NP.  Subjective:  Patient ID: Patricia [name] , female    DOB: 1966-06-29 , 58 y.o.   MRN: [id number]  Chief Complaint  Patient presents with   Hypertension    Patient presents today for a bpc. Patinet reports compliance with her meds. Patient reports she has been having a constant headache. She reports her bp has been running high.   Hyperlipidemia   Back Pain    Patient wants a referral. She reports you previously referred her to a pain dr but they did not accept her insurance.     HPI Discussed the use of AI scribe software for clinical note transcription with the patient, who gave verbal consent to proceed.  History of Present Illness Patricia [name] is a 58 year old female with diabetes and hypertension who presents for a follow-up visit.  She recently experienced a sinus infection but reports improvement. However, she has been experiencing a headache for the past five days. She has not consulted her neurologist in about a year and was previously prescribed topiramate  for her headaches.  She discusses her back pain, noting that it sometimes radiates to her lower legs, particularly the right one. She was unable to see an orthopedic specialist previously due to insurance issues and is now looking to see if her current insurance, [employer], will be accepted by the orthopedic provider.  She has a history of hypertension and is currently taking chlorthalidone  25 mg and lisinopril 5 mg. Her blood pressure has been elevated at home, with readings as high as 160/95 mmHg. She confirms taking her blood pressure medication today.  She has a history of three strokes, with the last one occurring after an interventional radiologist opened a blood vessel in her brain. She has been on  Brilinta  90 mg twice a day since 2015.  She was previously on Ozempic for diabetes management but stopped taking it after being told her A1c was normal. Her A1c is currently 7, and she has gained weight since stopping the medication. She is preparing for cataract surgery in January and February.  She is scheduled to see her nephrologist next Wednesday and has a history of kidney issues. She was self-paying for nephrology visits previously due to losing Medicaid last year.  She reports low vitamin D  levels and has been prescribed vitamin D  supplementation once weekly. She also mentions that she has not been taking Kerendia  as it is pending approval at the pharmacy.   HPI   Past Medical History:  Diagnosis Date   Anemia    in the past   Asthma     a long time ago   Bursitis    Depression    13 years ago -    Diabetes mellitus without complication (HCC)    type  2   GERD (gastroesophageal reflux disease)    occ. acid reflux   H/O: hysterectomy    Headache    migraines   Hypertension    Kidney stones    PFO (patent foramen ovale)    hx of PFO with surgery done as an adult   Rotator cuff tear    right   Stroke [facility]) 2009   lasdt stroke July 2015   Syncope and collapse      Family History  Problem  Relation Age of Onset   Hypertension Mother    Hypertension Father    Cancer Maternal Aunt    Breast cancer Maternal Aunt    Cancer Maternal Uncle      Current Outpatient Medications:    amoxicillin -clavulanate (AUGMENTIN ) 875-125 MG tablet, Take 1 tablet by mouth 2 (two) times daily., Disp: 20 tablet, Rfl: 0   cetirizine (ZYRTEC) 10 MG tablet, Take 10 mg by mouth at bedtime., Disp: , Rfl:    chlorthalidone  (HYGROTON ) 25 MG tablet, Take 25 mg by mouth at bedtime. , Disp: , Rfl: 5   Cholecalciferol (VITAMIN D -3) 25 MCG (1000 UT) CAPS, Take 1,000 Units by mouth at bedtime., Disp: , Rfl:    Eptinezumab -jjmr (VYEPTI ) 100 MG/ML injection, Vyepti  300mg ,every 3 months administered  IV over 30 minutes, Disp: 3 mL, Rfl: 4   escitalopram (LEXAPRO) 10 MG tablet, Take 10 mg by mouth daily., Disp: , Rfl:    HYDROcodone -acetaminophen  (NORCO/VICODIN) 5-325 MG tablet, Take 1 tablet by mouth every 4 (four) hours as needed., Disp: 10 tablet, Rfl: 0   JARDIANCE  25 MG TABS tablet, Take 25 mg by mouth at bedtime., Disp: , Rfl:    KERENDIA  10 MG TABS, Take 1 tablet (10 mg total) by mouth daily., Disp: 60 tablet, Rfl: 1   lisinopril (ZESTRIL) 10 MG tablet, Take 1 tablet (10 mg total) by mouth daily., Disp: 90 tablet, Rfl: 1   pravastatin  (PRAVACHOL ) 80 MG tablet, Take 80 mg by mouth at bedtime. , Disp: , Rfl:    promethazine  (PHENERGAN ) 25 MG tablet, TAKE 1 TABLET(25 MG) BY MOUTH EVERY 4 HOURS AS NEEDED FOR NAUSEA OR VOMITING Strength: 25 mg, Disp: 30 tablet, Rfl: 0   ticagrelor  (BRILINTA ) 90 MG TABS tablet, Take 1 tablet (90 mg total) by mouth 2 (two) times daily., Disp: 60 tablet, Rfl: 2   tiZANidine  (ZANAFLEX ) 4 MG tablet, Take 4 mg by mouth every 6 (six) hours as needed for muscle spasms., Disp: , Rfl:    Vitamin D , Ergocalciferol , (DRISDOL ) 1.25 MG (50000 UNIT) CAPS capsule, Take 1 capsule (50,000 Units total) by mouth every 7 (seven) days., Disp: 8 capsule, Rfl: 0   OZEMPIC, 0.25 OR 0.5 MG/DOSE, 2 MG/1.5ML SOPN, Inject into the skin. (Patient not taking: Reported on 09/24/2024), Disp: , Rfl:    topiramate  (TOPAMAX ) 100 MG tablet, TAKE 1 TABLET BY MOUTH EVERY NIGHT (Patient not taking: Reported on 09/24/2024), Disp: 30 tablet, Rfl: 1   Allergies  Allergen Reactions   Imitrex [Sumatriptan] Anaphylaxis   Triptans Anaphylaxis and Hives   Plavix  [Clopidogrel  Bisulfate] Hives   Zofran  [Ondansetron ] Nausea Only   Cymbalta  [Duloxetine  Hcl] Nausea Only   Lipitor [Atorvastatin ] Rash   Topamax  [Topiramate ] Other (See Comments)    Memory issues     Review of Systems   Today's Vitals   09/24/24 0935  BP: (!) 140/80  Pulse: 61  Temp: 98.2 F (36.8 C)  TempSrc: Oral  Weight: 225 lb  (102.1 kg)  Height: 5' 5 (1.651 m)  PainSc: 8   PainLoc: Head   Body mass index is 37.44 kg/m.  Wt Readings from Last 3 Encounters:  09/24/24 225 lb (102.1 kg)  09/12/24 222 lb 9.6 oz (101 kg)  05/23/24 214 lb (97.1 kg)    The ASCVD Risk score (Arnett DK, et al., 2019) failed to calculate for the following reasons:   Risk score cannot be calculated because patient has a medical history suggesting prior/existing ASCVD  Objective:  Physical Exam Constitutional:  Appearance: Normal appearance.  Cardiovascular:     Rate and Rhythm: Normal rate and regular rhythm.     Pulses: Normal pulses.     Heart sounds: Normal heart sounds.  Pulmonary:     Effort: Pulmonary effort is normal.     Breath sounds: Normal breath sounds.  Abdominal:     General: Bowel sounds are normal.  Musculoskeletal:     Comments: Slight right sided weakness  Neurological:     Mental Status: She is alert. Mental status is at baseline.         Assessment And Plan:   Assessment & Plan Benign hypertension with CKD (chronic kidney disease) stage IV (HCC) Elevated home blood pressure readings up to 160/95. Current medications include lisinopril and chlorthalidone . Concerns about hypertension's impact on kidneys. - Increased lisinopril to 10 mg daily. - Continue chlorthalidone  25 mg daily. - Monitor blood pressure at home and report fluctuations. - Ensure follow-up with nephrologist next Wednesday. Hyperlipidemia associated with type 2 diabetes mellitus (HCC) Continue brilinta  90 mg twice daily Type 2 diabetes mellitus with other neurologic complication, without long-term current use of insulin  (HCC) A1c at 7, indicating fairly controlled diabetes.  - Resume Ozempic once weekly. Lumbar pain Referred to orthopedics History of cardioembolic stroke - Continue Brilinta  90 mg twice daily until cardiologist evaluation. Class 2 severe obesity due to excess calories with serious comorbidity and body mass  index (BMI) of 37.0 to 37.9 in adult She is encouraged to strive for BMI less than 30 to decrease cardiac risk. Advised to aim for at least 150 minutes of exercise per week.       Return in about 4 months (around 01/23/2025) for bpc, physical.  Patient was given opportunity to ask questions. Patient verbalized understanding of the plan and was able to repeat key elements of the plan. All questions were answered to their satisfaction.    I, [name], NP, have reviewed all documentation for this visit. The documentation on 09/24/2024 for the exam, diagnosis, procedures, and orders are all accurate and complete.    IF YOU HAVE BEEN REFERRED TO A SPECIALIST, IT MAY TAKE 1-2 WEEKS TO SCHEDULE/PROCESS THE REFERRAL. IF YOU HAVE NOT HEARD FROM US /SPECIALIST IN TWO WEEKS, PLEASE GIVE US  A CALL AT [phone].

## 2024-09-24 NOTE — Assessment & Plan Note (Signed)
 Elevated home blood pressure readings up to 160/95. Current medications include lisinopril and chlorthalidone . Concerns about hypertension's impact on kidneys. - Increased lisinopril to 10 mg daily. - Continue chlorthalidone  25 mg daily. - Monitor blood pressure at home and report fluctuations. - Ensure follow-up with nephrologist next Wednesday.

## 2024-09-24 NOTE — Assessment & Plan Note (Addendum)
 She is encouraged to strive for BMI less than 30 to decrease cardiac risk. Advised to aim for at least 150 minutes of exercise per week.

## 2024-09-24 NOTE — Assessment & Plan Note (Signed)
 Continue brilinta  90 mg twice daily

## 2024-09-24 NOTE — Assessment & Plan Note (Signed)
Referred to orthopedics

## 2024-09-24 NOTE — Assessment & Plan Note (Signed)
-   Continue Brilinta  90 mg twice daily until cardiologist evaluation.

## 2024-09-24 NOTE — Patient Instructions (Signed)
 Hypertension, Adult Hypertension is another name for high blood pressure. High blood pressure forces your heart to work harder to pump blood. This can cause problems over time. There are two numbers in a blood pressure reading. There is a top number (systolic) over a bottom number (diastolic). It is best to have a blood pressure that is below 120/80. What are the causes? The cause of this condition is not known. Some other conditions can lead to high blood pressure. What increases the risk? Some lifestyle factors can make you more likely to develop high blood pressure: Smoking. Not getting enough exercise or physical activity. Being overweight. Having too much fat, sugar, calories, or salt (sodium) in your diet. Drinking too much alcohol. Other risk factors include: Having any of these conditions: Heart disease. Diabetes. High cholesterol. Kidney disease. Obstructive sleep apnea. Having a family history of high blood pressure and high cholesterol. Age. The risk increases with age. Stress. What are the signs or symptoms? High blood pressure may not cause symptoms. Very high blood pressure (hypertensive crisis) may cause: Headache. Fast or uneven heartbeats (palpitations). Shortness of breath. Nosebleed. Vomiting or feeling like you may vomit (nauseous). Changes in how you see. Very bad chest pain. Feeling dizzy. Seizures. How is this treated? This condition is treated by making healthy lifestyle changes, such as: Eating healthy foods. Exercising more. Drinking less alcohol. Your doctor may prescribe medicine if lifestyle changes do not help enough and if: Your top number is above 130. Your bottom number is above 80. Your personal target blood pressure may vary. Follow these instructions at home: Eating and drinking  If told, follow the DASH eating plan. To follow this plan: Fill one half of your plate at each meal with fruits and vegetables. Fill one fourth of your plate  at each meal with whole grains. Whole grains include whole-wheat pasta, brown rice, and whole-grain bread. Eat or drink low-fat dairy products, such as skim milk or low-fat yogurt. Fill one fourth of your plate at each meal with low-fat (lean) proteins. Low-fat proteins include fish, chicken without skin, eggs, beans, and tofu. Avoid fatty meat, cured and processed meat, or chicken with skin. Avoid pre-made or processed food. Limit the amount of salt in your diet to less than 1,500 mg each day. Do not drink alcohol if: Your doctor tells you not to drink. You are pregnant, may be pregnant, or are planning to become pregnant. If you drink alcohol: Limit how much you have to: 0-1 drink a day for women. 0-2 drinks a day for men. Know how much alcohol is in your drink. In the [place] one drink equals one 12 oz bottle of beer (355 mL), one 5 oz glass of wine (148 mL), or one 1 oz glass of hard liquor (44 mL). Lifestyle  Work with your doctor to stay at a healthy weight or to lose weight. Ask your doctor what the best weight is for you. Get at least 30 minutes of exercise that causes your heart to beat faster (aerobic exercise) most days of the week. This may include walking, swimming, or biking. Get at least 30 minutes of exercise that strengthens your muscles (resistance exercise) at least 3 days a week. This may include lifting weights or doing Pilates. Do not smoke or use any products that contain nicotine or tobacco. If you need help quitting, ask your doctor. Check your blood pressure at home as told by your doctor. Keep all follow-up visits. Medicines Take over-the-counter and prescription medicines  only as told by your doctor. Follow directions carefully. Do not skip doses of blood pressure medicine. The medicine does not work as well if you skip doses. Skipping doses also puts you at risk for problems. Ask your doctor about side effects or reactions to medicines that you should watch  for. Contact a doctor if: You think you are having a reaction to the medicine you are taking. You have headaches that keep coming back. You feel dizzy. You have swelling in your ankles. You have trouble with your vision. Get help right away if: You get a very bad headache. You start to feel mixed up (confused). You feel weak or numb. You feel faint. You have very bad pain in your: Chest. Belly (abdomen). You vomit more than once. You have trouble breathing. These symptoms may be an emergency. Get help right away. Call 911. Do not wait to see if the symptoms will go away. Do not drive yourself to the hospital. Summary Hypertension is another name for high blood pressure. High blood pressure forces your heart to work harder to pump blood. For most people, a normal blood pressure is less than 120/80. Making healthy choices can help lower blood pressure. If your blood pressure does not get lower with healthy choices, you may need to take medicine. This information is not intended to replace advice given to you by your health care provider. Make sure you discuss any questions you have with your health care provider. Document Revised: 07/28/2021 Document Reviewed: 07/28/2021 Elsevier Patient Education  2024 [employer].

## 2024-09-25 ENCOUNTER — Telehealth: Payer: Self-pay | Admitting: Pharmacist

## 2024-09-25 ENCOUNTER — Other Ambulatory Visit (HOSPITAL_COMMUNITY): Payer: Self-pay

## 2024-09-25 DIAGNOSIS — R739 Hyperglycemia, unspecified: Secondary | ICD-10-CM

## 2024-09-25 LAB — CBC
Hematocrit: 38.9 % (ref 34.0–46.6)
Hemoglobin: 12.1 g/dL (ref 11.1–15.9)
MCH: 27.9 pg (ref 26.6–33.0)
MCHC: 31.1 g/dL — ABNORMAL LOW (ref 31.5–35.7)
MCV: 90 fL (ref 79–97)
Platelets: 203 x10E3/uL (ref 150–450)
RBC: 4.33 x10E6/uL (ref 3.77–5.28)
RDW: 13.2 % (ref 11.7–15.4)
WBC: 6.2 x10E3/uL (ref 3.4–10.8)

## 2024-09-25 LAB — CMP14+EGFR
ALT: 21 IU/L (ref 0–32)
AST: 28 IU/L (ref 0–40)
Albumin: 3.8 g/dL (ref 3.8–4.9)
Alkaline Phosphatase: 147 IU/L — ABNORMAL HIGH (ref 49–135)
BUN/Creatinine Ratio: 15 (ref 9–23)
BUN: 25 mg/dL — ABNORMAL HIGH (ref 6–24)
Bilirubin Total: 0.2 mg/dL (ref 0.0–1.2)
CO2: 20 mmol/L (ref 20–29)
Calcium: 9.3 mg/dL (ref 8.7–10.2)
Chloride: 113 mmol/L — ABNORMAL HIGH (ref 96–106)
Creatinine, Ser: 1.71 mg/dL — ABNORMAL HIGH (ref 0.57–1.00)
Globulin, Total: 2.8 g/dL (ref 1.5–4.5)
Glucose: 161 mg/dL — ABNORMAL HIGH (ref 70–99)
Potassium: 4.6 mmol/L (ref 3.5–5.2)
Sodium: 145 mmol/L — ABNORMAL HIGH (ref 134–144)
Total Protein: 6.6 g/dL (ref 6.0–8.5)
eGFR: 35 mL/min/1.73 — ABNORMAL LOW (ref >59)

## 2024-09-25 LAB — HEMOGLOBIN A1C
Est. average glucose Bld gHb Est-mCnc: 217 mg/dL
Hgb A1c MFr Bld: 9.2 % — ABNORMAL HIGH (ref 4.8–5.6)

## 2024-09-25 LAB — LIPID PANEL
Chol/HDL Ratio: 3 ratio (ref 0.0–4.4)
Cholesterol, Total: 176 mg/dL (ref 100–199)
HDL: 59 mg/dL (ref >39)
LDL Chol Calc (NIH): 99 mg/dL (ref 0–99)
Triglycerides: 101 mg/dL (ref 0–149)
VLDL Cholesterol Cal: 18 mg/dL (ref 5–40)

## 2024-09-25 NOTE — Progress Notes (Unsigned)
 09/26/2024 Name: Patricia [name] MRN: [id number] DOB: 04-Nov-1965  Chief Complaint  Patient presents with   Medication Management    Diabetes     Patricia [name] is a 58 y.o. year old female who presented for a telephone visit.   They were referred to the pharmacist by their PCP for assistance in managing diabetes.    Subjective: Patient is a 58 year old female referred for medication assistance and diabetes management. She has multiple medical conditions including but not limited to:   Care Team: Primary Care Provider: [name], NP ; Next Scheduled Visit: 01/28/25  Medication Access/Adherence  Current Pharmacy:  [facility] - [facility] [address] [place] [address] Phone: [phone] Fax: [phone]  [id number] - [place] - [address] AT [facility] OF [address] [place] [address] Phone: [phone] Fax: [phone]   Patient reports affordability concerns with their medications: Yes  Patient reports access/transportation concerns to their pharmacy: No  Patient reports adherence concerns with their medications:  Yes  Cannot remember due to strokes   Diabetes:  Current medications:   Jardiance  25 mg 1 tablet daily Ozempic 0.5 mg weekly   Patient denies hypoglycemic s/sx including  dizziness, shakiness, sweating. Patient denies hyperglycemic symptoms including  polyuria, polydipsia, polyphagia, nocturia, neuropathy, blurred vision.  Statin? yes (Pravastatin  80 mg); ACEi/ARB? yes (lisinopril  10 mg ) Last urinary albumin/creatinine ratio:  Lab Results  Component Value Date   MICRALBCREAT 5 05/23/2024   Last eye exam:  Lab Results  Component Value Date   HMDIABEYEEXA No Retinopathy 07/31/2024   Last foot exam: No foot exam found Tobacco Use:  Tobacco Use: Low Risk  (09/24/2024)   Patient History    Smoking Tobacco Use: Never    Smokeless  Tobacco Use: Never    Passive Exposure: Not on file     Objective:  Lab Results  Component Value Date   HGBA1C 9.2 (H) 09/24/2024    Lab Results  Component Value Date   CREATININE 1.71 (H) 09/24/2024   BUN 25 (H) 09/24/2024   NA 145 (H) 09/24/2024   K 4.6 09/24/2024   CL 113 (H) 09/24/2024   CO2 20 09/24/2024    Lab Results  Component Value Date   CHOL 176 09/24/2024   HDL 59 09/24/2024   LDLCALC 99 09/24/2024   TRIG 101 09/24/2024   CHOLHDL 3.0 09/24/2024    Medications Reviewed Today     Reviewed by Jolee Cassius PARAS, RPH (Pharmacist) on 09/26/24 at 1325  Med List Status: <None>   Medication Order Taking? Sig Documenting Provider Last Dose Status Informant    Discontinued 09/25/24 1528 (Completed Course)   cetirizine (ZYRTEC) 10 MG tablet [id number] Yes Take 10 mg by mouth at bedtime. [provider]  Active Self  chlorthalidone  (HYGROTON ) 25 MG tablet [id number] Yes Take 25 mg by mouth at bedtime.  [provider]  Active Self           Med Note FELIPE, MELISSA B   Thu Jun 22, 2016  1:30 PM)    Cholecalciferol (VITAMIN D -3) 25 MCG (1000 UT) CAPS [id number] Yes Take 1,000 Units by mouth at bedtime. [provider]  Active Self    Discontinued 09/25/24 1529 (Cost of medication)            Med Note>> Jolee Cassius PARAS [facility]   09/25/2024  3:29 PM Patient reports not taking  this in months due to cost    escitalopram (LEXAPRO) 10 MG tablet [id number] Yes Take 10 mg by mouth daily. [provider]  Active   gatifloxacin (ZYMAXID) 0.5 % SOLN [id number] Yes Place 1 drop into the left eye 4 (four) times daily. [provider]  Active     Discontinued 09/25/24 1530 (Completed Course)          Med Note MARGARITA EVLYN ORN   Wed May 09, 2023  1:17 PM) As needed. Fill date 03/12/23 #120 today #11 last taken greater than a week ago. This is Rx by primary at [facility]  JARDIANCE  25 MG TABS tablet [id number] Yes Take 25 mg by mouth at bedtime. [provider]  Active Self  KERENDIA  10 MG TABS [id number] Yes Take 1 tablet (10 mg total) by mouth daily. [name], NP  Active   ketorolac  (ACULAR ) 0.5 % ophthalmic solution [id number] Yes  [provider]  Active   lisinopril  (ZESTRIL ) 10 MG tablet [id number] Yes Take 1 tablet (10 mg total) by mouth daily. [name], NP  Active   OZEMPIC, 0.25 OR 0.5 MG/DOSE, 2 MG/1.5ML SOPN [id number] Yes Inject into the skin. [provider]  Active   pravastatin  (PRAVACHOL ) 80 MG tablet [id number] Yes Take 80 mg by mouth at bedtime.  [provider]  Active Self  prednisoLONE acetate (PRED FORTE) 1 % ophthalmic suspension [id number] Yes Place 1 drop into the left eye 4 (four) times daily. [provider]  Active   promethazine  (PHENERGAN ) 25 MG tablet [id number] Yes TAKE 1 TABLET(25 MG) BY MOUTH EVERY 4 HOURS AS NEEDED FOR NAUSEA OR VOMITING Strength: 25 mg Ohenhen, Pat, NP  Active   ticagrelor  (BRILINTA ) 90 MG TABS tablet [id number] Yes Take 1 tablet (90 mg total) by mouth 2 (two) times daily. [name], NP  Active   tiZANidine  (ZANAFLEX ) 4 MG tablet [id number] Yes Take 4 mg by mouth every 6 (six) hours as needed for muscle spasms. [provider]  Active Self  topiramate  (TOPAMAX ) 100 MG tablet [id number] Yes TAKE 1 TABLET BY MOUTH EVERY NIGHT Skeet Juliene SAUNDERS, DO  Active     Discontinued 09/25/24 1535 (Completed Course)                 09/24/2024    9:35 AM 09/12/2024   11:51 AM 05/23/2024   10:43 AM  Vitals with BMI  Height 5' 5    Weight 225 lbs 222 lbs 10 oz 214 lbs  BMI 37.44    Systolic 140 130 897  Diastolic 80 92 70  Pulse 61 60 66     Assessment/Plan:   Diabetes: - Currently uncontrolled; goal A1c <7%. Cardiorenal risk reduction is optimized.. Blood pressure is not at goal <130/80. LDL is not at goal.  - Patient restarted Ozempic. Has not had Chlorthalidone  filled. Called the Pharmacy. Chlorthalidone  25 mg  was filled 08/07/24 for a 90  day supply.  Patient said she did not have it. The Pharmacy was asked to fill a 30 day supply of Chlorthalidone --price was $12.19  Called [facility] about Kerendia .  The Pharmacist said they were getting a not covered rejection.  The Patient was under the impression that Kerendia  needed a prior authorization.    I called [employer] and spoke with two different representatives .  The Representatives said they could not find the Patient in their system.    Patient was called back. Unfortunately, she did not answer the phone.  Left message.  Follow Up Plan:  Call Patient back in 3-5 business days.   Cassius DOROTHA Brought, PharmD, BCACP Clinical Pharmacist [phone]

## 2024-09-28 ENCOUNTER — Other Ambulatory Visit: Payer: Self-pay | Admitting: Neurology

## 2024-09-28 DIAGNOSIS — G43019 Migraine without aura, intractable, without status migrainosus: Secondary | ICD-10-CM

## 2024-09-28 DIAGNOSIS — G43409 Hemiplegic migraine, not intractable, without status migrainosus: Secondary | ICD-10-CM

## 2024-10-02 ENCOUNTER — Telehealth: Payer: Self-pay | Admitting: Pharmacist

## 2024-10-02 DIAGNOSIS — E118 Type 2 diabetes mellitus with unspecified complications: Secondary | ICD-10-CM

## 2024-10-02 DIAGNOSIS — G43409 Hemiplegic migraine, not intractable, without status migrainosus: Secondary | ICD-10-CM

## 2024-10-02 DIAGNOSIS — G43019 Migraine without aura, intractable, without status migrainosus: Secondary | ICD-10-CM

## 2024-10-02 NOTE — Progress Notes (Unsigned)
 10/02/2024 Name: Patricia [name] MRN: [id number] DOB: [birth year]  Chief Complaint  Patient presents with   Medication Adherence    Topiramate       Patricia [name] is a 58 y.o. year old female who presented for a telephone visit.   They were referred to the pharmacist by their PCP for assistance in managing diabetes.    Subjective:  Patient is a 58 year old female with multiple medical conditions including but not limited to: CVA, hypertension, CKD stage IV, type 2 diabetes, hyperlipidemia, morbid obesity, lumbar pain, TIA, and vitamin D  deficiency.  She called today to request a refill on Topiramate .  It was last written by Dr. Skeet.  Care Team: Primary Care Provider: [name], NP ;   Medication Access/Adherence  Current Pharmacy:  [facility] - [facility] [address] [place] [address] Phone: [phone] Fax: [phone]  [id number] - [place], [place] - [address] AT [facility] OF [address] & [address] [place] [address] Phone: [phone] Fax: [phone]   Patient reports affordability concerns with their medications: Yes  Patient reports access/transportation concerns to their pharmacy: No  Patient reports adherence concerns with their medications:  No    Patient   {Pharmacy S/O Choices:26420}   Objective:  Lab Results  Component Value Date   HGBA1C 9.2 (H) 09/24/2024    Lab Results  Component Value Date   CREATININE 1.71 (H) 09/24/2024   BUN 25 (H) 09/24/2024   NA 145 (H) 09/24/2024   K 4.6 09/24/2024   CL 113 (H) 09/24/2024   CO2 20 09/24/2024    Lab Results  Component Value Date   CHOL 176 09/24/2024   HDL 59 09/24/2024   LDLCALC 99 09/24/2024   TRIG 101 09/24/2024   CHOLHDL 3.0 09/24/2024    Medications Reviewed Today     Reviewed by Jolee Cassius PARAS, RPH (Pharmacist) on 10/02/24 at 1118  Med List Status: <None>   Medication Order  Taking? Sig Documenting Provider Last Dose Status Informant  cetirizine (ZYRTEC) 10 MG tablet [id number] Yes Take 10 mg by mouth at bedtime. [provider]  Active Self  chlorthalidone  (HYGROTON ) 25 MG tablet [id number] Yes Take 25 mg by mouth at bedtime.  [provider]  Active Self           Med Note FELIPE, MELISSA B   Thu Jun 22, 2016  1:30 PM)    Cholecalciferol (VITAMIN D -3) 25 MCG (1000 UT) CAPS [id number] Yes Take 1,000 Units by mouth at bedtime. [provider]  Active Self  escitalopram (LEXAPRO) 10 MG tablet [id number] Yes Take 10 mg by mouth daily. [provider]  Active   gatifloxacin (ZYMAXID) 0.5 % SOLN [id number] Yes Place 1 drop into the left eye 4 (four) times daily. [provider]  Active   JARDIANCE  25 MG TABS tablet [id number] Yes Take 25 mg by mouth at bedtime. [provider]  Active Self    Discontinued 10/02/24 1106 (Discontinued by provider)            Med Note>> Jolee Cassius PARAS, [facility]   10/02/2024 11:06 AM Nephrologist discontiued    ketorolac  (ACULAR ) 0.5 % ophthalmic solution [id number]    Patient not taking: Reported on 10/02/2024   [provider]  Consider Medication Status and Discontinue     Discontinued 10/02/24 1106 (Dose change)   lisinopril  (ZESTRIL ) 20 MG tablet [id number] Yes Take 20 mg  by mouth daily. [provider]  Active     Discontinued 10/02/24 1106 (Dose change)   OZEMPIC, 0.25 OR 0.5 MG/DOSE, 2 MG/3ML SOPN [id number] Yes Inject 0.25 mg into the skin once a week. Norine Manuelita LABOR, MD  Active   pravastatin  (PRAVACHOL ) 80 MG tablet [id number] Yes Take 80 mg by mouth at bedtime.  [provider]  Active Self  prednisoLONE acetate (PRED FORTE) 1 % ophthalmic suspension [id number] Yes Place 1 drop into the left eye 4 (four) times daily. [provider]  Active   promethazine  (PHENERGAN ) 25 MG tablet [id number] Yes TAKE 1 TABLET(25 MG) BY MOUTH EVERY 4 HOURS AS NEEDED FOR  NAUSEA OR VOMITING Strength: 25 mg Ohenhen, Pat, NP  Active   ticagrelor  (BRILINTA ) 90 MG TABS tablet [id number] Yes Take 1 tablet (90 mg total) by mouth 2 (two) times daily. [name], NP  Active   tiZANidine  (ZANAFLEX ) 4 MG tablet [id number] Yes Take 4 mg by mouth every 6 (six) hours as needed for muscle spasms. [provider]  Active Self  topiramate  (TOPAMAX ) 100 MG tablet [id number] Yes TAKE 1 TABLET BY MOUTH EVERY NIGHT Skeet, Adam R, DO  Active               Assessment/Plan:   Patient's medications were reviewed. She shared and it was confirmed by Pharmacy fill data that her Nephrologist increased Lisinopril  from 10 mg daily to 20 mg daily.  In addition, Ozempic was restarted and she reported being told to stop Kerendia .  I had difficulty last week with the Patient's medications being processed correctly. [facility] was called and they said the Patient was not insured. When I called [facility] last week, there were issues with Patient's medications being billed.  Patient requested the refill of Topiramate  be sent to [facility].        [facility] was called.  They verified the copay for Ozempic as $24.99 covered through [employer].  Patient confirmed picking up chlorthalidone  last week.    Follow Up Plan:    Will send refill to Provider under pending for review.  Will follow up with the Patient in 2 weeks

## 2024-10-03 NOTE — Addendum Note (Signed)
 Addended by: DAYNE SHERRY RAMAN on: 10/03/2024 02:29 PM   Modules accepted: Orders

## 2024-10-03 NOTE — Telephone Encounter (Signed)
 Patient is not taking Vyepti . Treatment plan discontinued.  Sherry Pennant, PharmD, MPH, BCPS, CPP Clinical Pharmacist

## 2024-10-04 ENCOUNTER — Other Ambulatory Visit: Payer: Self-pay | Admitting: Family Medicine

## 2024-10-04 DIAGNOSIS — R110 Nausea: Secondary | ICD-10-CM

## 2024-10-07 MED ORDER — TOPIRAMATE 100 MG PO TABS: ORAL_TABLET | ORAL | 1 refills | Status: AC

## 2024-10-08 ENCOUNTER — Other Ambulatory Visit: Payer: Self-pay | Admitting: Family Medicine

## 2024-10-08 ENCOUNTER — Telehealth: Payer: Self-pay | Admitting: Pharmacist

## 2024-10-08 ENCOUNTER — Telehealth: Payer: Self-pay | Admitting: Neurology

## 2024-10-08 DIAGNOSIS — G43409 Hemiplegic migraine, not intractable, without status migrainosus: Secondary | ICD-10-CM

## 2024-10-08 DIAGNOSIS — G43019 Migraine without aura, intractable, without status migrainosus: Secondary | ICD-10-CM

## 2024-10-08 DIAGNOSIS — Z79899 Other long term (current) drug therapy: Secondary | ICD-10-CM

## 2024-10-08 NOTE — Telephone Encounter (Signed)
 Who's calling (name and relationship to patient) : Patricia [name]: self  Best contact number: [phone]  Provider they see: Dr. Skeet  Reason for call: [name] called in stating that she contacted the pharmacy and was informed Rx was not approved. She stated that she is completely out. She wanted to know if there are some samples for rayvial for migraines.     PRESCRIPTION REFILL ONLY  Name of prescription: topiramate  and promethazine 

## 2024-10-08 NOTE — Progress Notes (Signed)
° °  10/08/2024 Name: Patricia [name] MRN: [id number] DOB: [birth year]  Chief Complaint  Patient presents with   Medication Adherence    Topiramate      Patient called and wondered about getting her Topiramate .  HIPAA identifiers were obtained.  Patient's prescription request was sent to Patricia Creighton, NP last week.  I spoke with Patricia today in clinic about Topiramate . It was originally written by Dr. Skeet. Patricia said she thought Dr. Skeet had held topiramate  for some reason. I mentioned this to the Patient but she said He did not.  Since there was hesitancy, it was suggested to the Patient that she reach out to Dr. Jayme office about refills.  Patient communicated understanding.   Patricia [name], PharmD, BCACP Clinical Pharmacist [phone]

## 2024-10-08 NOTE — Telephone Encounter (Signed)
 Patient aware appt cancelled referral sent to  Neurology - Comprehensive Headache Program

## 2024-10-09 ENCOUNTER — Ambulatory Visit: Payer: Self-pay | Admitting: Neurology

## 2024-10-13 ENCOUNTER — Other Ambulatory Visit: Payer: Self-pay

## 2024-10-13 ENCOUNTER — Emergency Department (HOSPITAL_COMMUNITY)
Admission: EM | Admit: 2024-10-13 | Discharge: 2024-10-13 | Disposition: A | Discharge status: Home / Self Care | Payer: Medicare (Managed Care) | Attending: Emergency Medicine | Admitting: Emergency Medicine

## 2024-10-13 ENCOUNTER — Emergency Department (HOSPITAL_COMMUNITY): Payer: Medicare (Managed Care)

## 2024-10-13 ENCOUNTER — Encounter (HOSPITAL_COMMUNITY): Payer: Self-pay

## 2024-10-13 DIAGNOSIS — W1830XA Fall on same level, unspecified, initial encounter: Secondary | ICD-10-CM | POA: Insufficient documentation

## 2024-10-13 DIAGNOSIS — M79672 Pain in left foot: Secondary | ICD-10-CM | POA: Diagnosis present

## 2024-10-13 DIAGNOSIS — S92322A Displaced fracture of second metatarsal bone, left foot, initial encounter for closed fracture: Secondary | ICD-10-CM | POA: Diagnosis not present

## 2024-10-13 DIAGNOSIS — S92512A Displaced fracture of proximal phalanx of left lesser toe(s), initial encounter for closed fracture: Secondary | ICD-10-CM | POA: Diagnosis not present

## 2024-10-13 MED ORDER — DICLOFENAC SODIUM 1 % EX GEL: 4.0000 g | Freq: Four times a day (QID) | CUTANEOUS | 0 refills | Status: AC

## 2024-10-13 NOTE — ED Notes (Signed)
 Patient transported to X-ray

## 2024-10-13 NOTE — ED Provider Notes (Signed)
 " Patricia [name] Provider Note   CSN: [id number] Arrival date & time: 10/13/24  9065     Patient presents with: Foot Injury   Patricia [name] is a 58 y.o. female.   58 yo F with a chief complaints of left foot pain the patient fell yesterday.  Tells me that she falls every so often after she had a stroke.  Is not sure exactly how she injured the foot.  Pain to the top of the foot especially about the 2nd, 3rd and 4th toes.  Denies other injury.   Foot Injury      Prior to Admission medications  Medication Sig Start Date End Date Taking? Authorizing Provider  diclofenac  Sodium (VOLTAREN ) 1 % GEL Apply 4 g topically 4 (four) times daily. 10/13/24  Yes Emil Share, DO  cetirizine (ZYRTEC) 10 MG tablet Take 10 mg by mouth at bedtime.    [provider]  chlorthalidone  (HYGROTON ) 25 MG tablet Take 25 mg by mouth at bedtime.  09/03/15   [provider]  Cholecalciferol (VITAMIN D -3) 25 MCG (1000 UT) CAPS Take 1,000 Units by mouth at bedtime.    [provider]  escitalopram (LEXAPRO) 10 MG tablet Take 10 mg by mouth daily. 03/18/24   [provider]  gatifloxacin (ZYMAXID) 0.5 % SOLN Place 1 drop into the left eye 4 (four) times daily. 08/29/24   [provider]  JARDIANCE  25 MG TABS tablet Take 25 mg by mouth at bedtime.    [provider]  ketorolac  (ACULAR ) 0.5 % ophthalmic solution  09/01/24   [provider]  lisinopril  (ZESTRIL ) 20 MG tablet Take 20 mg by mouth daily. 10/01/24   [provider]  OZEMPIC, 0.25 OR 0.5 MG/DOSE, 2 MG/3ML SOPN Inject 0.25 mg into the skin once a week. 10/02/24   Norine Manuelita LABOR, MD  pravastatin  (PRAVACHOL ) 80 MG tablet Take 80 mg by mouth at bedtime.     [provider]  prednisoLONE acetate (PRED FORTE) 1 % ophthalmic suspension Place 1 drop into the left eye 4 (four) times daily. 08/29/24   [provider]   promethazine  (PHENERGAN ) 25 MG tablet TAKE 1 TABLET(25 MG) BY MOUTH EVERY 4 HOURS AS NEEDED FOR NAUSEA OR VOMITING Strength: 25 mg 05/23/24   [name], NP  ticagrelor  (BRILINTA ) 90 MG TABS tablet Take 1 tablet (90 mg total) by mouth 2 (two) times daily. 09/16/24   [name], NP  tiZANidine  (ZANAFLEX ) 4 MG tablet Take 4 mg by mouth every 6 (six) hours as needed for muscle spasms.    [provider]  topiramate  (TOPAMAX ) 100 MG tablet TAKE 1 TABLET BY MOUTH EVERY NIGHT 10/07/24   [name], NP    Allergies: Imitrex [sumatriptan], Triptans, Plavix  [clopidogrel  bisulfate], Zofran  [ondansetron ], Cymbalta  [duloxetine  hcl], Lipitor [atorvastatin ], and Topamax  [topiramate ]    Review of Systems  Updated Vital Signs BP (!) 138/91   Pulse 82   Temp 97.9 F (36.6 C)   Resp 17   Ht 5' 5 (1.651 m)   Wt 102.1 kg   SpO2 95%   BMI 37.46 kg/m   Physical Exam Vitals and nursing note reviewed.  Constitutional:      General: She is not in acute distress.    Appearance: She is well-developed. She is not diaphoretic.  HENT:     Head: Normocephalic and atraumatic.  Eyes:     Pupils: Pupils are equal, round, and reactive to light.  Cardiovascular:     Rate and Rhythm: Normal rate and regular rhythm.     Heart sounds: No murmur heard.    No friction rub. No gallop.  Pulmonary:     Effort: Pulmonary effort is normal.     Breath sounds: No wheezing or rales.  Abdominal:     General: There is no distension.     Palpations: Abdomen is soft.     Tenderness: There is no abdominal tenderness.  Musculoskeletal:        General: Swelling and tenderness present.     Cervical back: Normal range of motion and neck supple.     Comments: Pain and swelling to the 2nd, 3rd and 4th metatarsal  Skin:    General: Skin is warm and dry.  Neurological:     Mental Status: She is alert and oriented to person, place, and time.  Psychiatric:        Behavior: Behavior normal.     (all labs  ordered are listed, but only abnormal results are displayed) Labs Reviewed - No data to display  EKG: None  Radiology: DG Foot Complete Left Result Date: 10/13/2024 EXAM: 3 OR MORE VIEW(S) XRAY OF THE LEFT FOOT 10/13/2024 10:13:41 AM COMPARISON: None available. CLINICAL HISTORY: Swelling FINDINGS: BONES AND JOINTS: Mildly displaced, fracture of the distal metaphysis of the 2nd proximal phalanx. There is approximately 2.5 mm of lateral displacement. SOFT TISSUES: Soft tissue swelling about the 2nd digit. No radiopaque foreign body. IMPRESSION: 1. Mildly displaced fracture of the distal metaphysis of the 2nd proximal phalanx. Electronically signed by: Rogelia Myers MD 10/13/2024 10:58 AM EST RP Workstation: [id number]     Procedures   Medications Ordered in the ED - No data to display                                  Medical Decision Making Amount and/or Complexity of Data Reviewed Radiology: ordered.   58 yo F with a chief complaints of left foot pain after a fall yesterday.  Plain film on my independent to rotation with a displaced fracture of the second proximal phalanx.  Will place in a cam boot.  Orthopedic follow-up.  PDMP was reviewed.  Patient is on chronic narcotics.  Will hold off on adding extra narcotic medications.  12:43 PM:  I have discussed the diagnosis/risks/treatment options with the patient.  Evaluation and diagnostic testing in the emergency department does not suggest an emergent condition requiring admission or immediate intervention beyond what has been performed at this time.  They will follow up with PCP. We also discussed returning to the ED immediately if new or worsening sx occur. We discussed the sx which are most concerning (e.g., sudden worsening pain, fever, inability to tolerate by mouth) that necessitate immediate return. Medications administered to the patient during their visit and any new prescriptions provided to the patient are listed  below.  Medications given during this visit Medications - No data to display   The patient appears reasonably screen and/or stabilized for discharge and I doubt any other medical condition or other [facility] requiring further screening, evaluation, or treatment in the ED at this time prior to discharge.       Final diagnoses:  Closed displaced fracture of second metatarsal bone of left foot, initial encounter    ED Discharge Orders          Ordered    diclofenac  Sodium (VOLTAREN )  1 % GEL  4 times daily        10/13/24 1046               Emil Share, [place] 10/13/24 1243  "

## 2024-10-13 NOTE — ED Triage Notes (Signed)
 Reports fell yesterday in her house.  Complains of pain across top of foot above toes.  Reports since her stroke she has falls every now and then.

## 2024-10-13 NOTE — Discharge Instructions (Addendum)
Take 4 over the counter ibuprofen tablets 3 times a day or 2 over-the-counter naproxen tablets twice a day for pain. Also take tylenol 1000mg (2 extra strength) four times a day.   Follow up with the ortho doc in the office.

## 2024-10-22 ENCOUNTER — Other Ambulatory Visit: Payer: Self-pay | Admitting: Physical Medicine and Rehabilitation

## 2024-10-22 ENCOUNTER — Ambulatory Visit: Payer: Medicare (Managed Care) | Admitting: Physical Medicine and Rehabilitation

## 2024-10-25 ENCOUNTER — Other Ambulatory Visit (HOSPITAL_COMMUNITY): Payer: Self-pay

## 2024-11-05 ENCOUNTER — Other Ambulatory Visit (INDEPENDENT_AMBULATORY_CARE_PROVIDER_SITE_OTHER): Payer: Medicare (Managed Care)

## 2024-11-05 ENCOUNTER — Encounter: Payer: Self-pay | Admitting: Physical Medicine and Rehabilitation

## 2024-11-05 ENCOUNTER — Ambulatory Visit: Payer: Medicare (Managed Care) | Admitting: Physical Medicine and Rehabilitation

## 2024-11-05 VITALS — BP 117/77 | HR 71

## 2024-11-05 DIAGNOSIS — R202 Paresthesia of skin: Secondary | ICD-10-CM

## 2024-11-05 DIAGNOSIS — M47819 Spondylosis without myelopathy or radiculopathy, site unspecified: Secondary | ICD-10-CM | POA: Diagnosis not present

## 2024-11-05 DIAGNOSIS — G8929 Other chronic pain: Secondary | ICD-10-CM | POA: Diagnosis not present

## 2024-11-05 DIAGNOSIS — M545 Low back pain, unspecified: Secondary | ICD-10-CM | POA: Diagnosis not present

## 2024-11-05 NOTE — Progress Notes (Signed)
 "  Patricia [name] - 59 y.o. female MRN [id number]  Date of birth: 08/13/1966  Office Visit Note: Visit Date: 11/05/2024 PCP: [name], NP Referred by: [name], NP  Subjective: Chief Complaint  Patient presents with   Lower Back - Pain   HPI: Patricia [name] is a 59 y.o. female who comes in today per the request of Pries Petrina, NP for evaluation of chronic, worsening and severe bilateral lower back pain. Intermittent tingling sensation to right anterior thigh. Pain ongoing for several years, worsens with prolonged standing and sitting. She describes pain as sharp and stabbing sensation, currently rates as 7 out of 10. Some relief of pain with home exercise regimen, rest and use of medications. Some relief of pain with tizanidine . History of chronic pain management at [facility]. She discontinued pain management several months ago due to insurance issues. No history of formal physical therapy. No prior imaging of lumbar spine. No history of lumbar surgery/injections. No bowel/bladder incontinence.   She does have headache disorder, previously managed by Dr. Juliene Dunnings. She was recently referred to headache clinic at [facility], she is working to schedule appointment.   She was recently evaluated in the emergency department for evaluation of left foot pain. She was diagnosed with closed displaced fracture of second metatarsal bone of left foot. She is wearing CAM walker in the office today.   Patients course is complicated by multiple strokes, acute respiratory failure, renal insufficiency, diabetes mellitus, occipital neuralgia, headache, depression and morbid obesity. Hemoglobin A1C on 09/24/2024 was 9.2.     Review of Systems  Musculoskeletal:  Positive for back pain.  Neurological:  Positive for tingling. Negative for focal weakness and weakness.  All other systems reviewed and are negative.  Otherwise per HPI.  Assessment & Plan: Visit Diagnoses:     ICD-10-CM   1. Chronic bilateral low back pain without sciatica  M54.50 XR Lumbar Spine 2-3 Views   G89.29 Ambulatory referral to Physical Therapy    MR LUMBAR SPINE WO CONTRAST    2. Spondylosis without myelopathy or radiculopathy  M47.819 Ambulatory referral to Physical Therapy    MR LUMBAR SPINE WO CONTRAST    3. Paresthesia of skin  R20.2 Ambulatory referral to Physical Therapy    MR LUMBAR SPINE WO CONTRAST       Plan: Findings:  Chronic, worsening and severe bilateral lower back pain. No true radicular symptoms down the legs. She does have chronic paresthesias to right anterior thigh. Difficult to say if these paresthesias are directly related to spine pathology with her history of multiple strokes and right sided deficits. Could be more of a post stroke pain syndrome. I obtained lumbar radiographs in the office today that show multi level facet arthropathy, most prominent at L3-L4 and L4-L5. There is mild anterolisthesis of L3 on L4. We discussed treatment plan in detail today. Next step is to place order for lumbar MRI imaging. Depending on results of MRI imaging we discussed possibility of performing lumbar injections, would likely benefit from facet joint injections. I also placed order for short course of formal physical therapy with a focus on core strengthening. She has no questions at this time. We will see her back for lumbar MRI review. Her exam today is non focal, good strength noted to bilateral lower extremities.     Meds & Orders: No orders of the defined types were placed in this encounter.   Orders Placed This Encounter  Procedures   XR  Lumbar Spine 2-3 Views   MR LUMBAR SPINE WO CONTRAST   Ambulatory referral to Physical Therapy    Follow-up: Return for Lumbar MRI review.   Procedures: No procedures performed      Clinical History: No specialty comments available.   She reports that she has never smoked. She has never used smokeless tobacco.  Recent Labs     05/23/24 1143 09/24/24 1059  HGBA1C 7.0* 9.2*    Objective:  VS:  HT:    WT:   BMI:     BP:117/77  HR:71bpm  TEMP: ( )  RESP:  Physical Exam Vitals and nursing note reviewed.  HENT:     Head: Normocephalic and atraumatic.     Right Ear: External ear normal.     Left Ear: External ear normal.     Nose: Nose normal.     Mouth/Throat:     Mouth: Mucous membranes are moist.  Eyes:     Extraocular Movements: Extraocular movements intact.  Cardiovascular:     Rate and Rhythm: Normal rate.     Pulses: Normal pulses.  Pulmonary:     Effort: Pulmonary effort is normal.  Abdominal:     General: Abdomen is flat. There is no distension.  Musculoskeletal:        General: Tenderness present.     Cervical back: Normal range of motion.     Comments: Patient rises from seated position to standing without difficulty. Pain noted with lumbar extension and facet loading. 5/5 strength noted with bilateral hip flexion, knee flexion/extension, ankle dorsiflexion/plantarflexion and EHL. No clonus noted bilaterally. No pain upon palpation of greater trochanters. No pain with internal/external rotation of bilateral hips. Sensation intact bilaterally. Negative slump test bilaterally. Antalgic gait noted, CAM walker to left foot.   Skin:    General: Skin is warm and dry.     Capillary Refill: Capillary refill takes less than 2 seconds.  Neurological:     Mental Status: She is alert and oriented to person, place, and time.  Psychiatric:        Mood and Affect: Mood normal.        Behavior: Behavior normal.     Ortho Exam  Imaging: XR Lumbar Spine 2-3 Views Result Date: 11/05/2024 AP and lateral radiographs of lumbar spine shows normal alignment and segmentation, well preserved disc spacing, multi level facet arthropathy, most prominent at L3-L4 and L4-L5, mild anterolisthesis of L3 on L4, no fractures.    Past Medical/Family/Surgical/Social History: Medications & Allergies reviewed per EMR,  new medications updated. Patient Active Problem List   Diagnosis Date Noted   Class 2 severe obesity due to excess calories with serious comorbidity and body mass index (BMI) of 37.0 to 37.9 in adult 09/24/2024   Vitamin D  deficiency 05/28/2024   Lumbar pain 05/28/2024   Establishing care with new doctor, encounter for 05/28/2024   Immunization due 05/28/2024   Encounter for hepatitis C screening test for low risk patient 05/28/2024   Nausea 05/28/2024   Benign hypertension with CKD (chronic kidney disease) stage IV (HCC) 05/28/2024   History of total hysterectomy 05/23/2024   Hyperlipidemia associated with type 2 diabetes mellitus (HCC) 05/23/2024   Cervicalgia 07/11/2023   Facet arthropathy, cervical 05/09/2023   Intractable migraine without aura and without status migrainosus 05/17/2022   Renal insufficiency 03/10/2021   Chronic daily headache 06/19/2017   Intractable chronic migraine without aura and with status migrainosus 09/15/2016   Depression 08/23/2016   Cerebrovascular accident (CVA) due to  occlusion of left middle cerebral artery (HCC) 07/28/2016   Acute respiratory failure (HCC)    Middle cerebral artery stenosis, left 07/24/2016   Headache 03/23/2016   Type 2 diabetes mellitus with complication, with long-term current use of insulin  (HCC) 09/08/2015   Hyperglycemia    TIA (transient ischemic attack)    AKI (acute kidney injury) 02/20/2015   ARF (acute renal failure) 02/20/2015   Rotator cuff tear 02/04/2015   Occipital neuralgia 08/27/2014   Type 2 diabetes mellitus with complication (HCC) 08/27/2014   PFO (patent foramen ovale) 06/17/2014   Hyperlipidemia 06/17/2014   Morbid obesity, unspecified obesity type (HCC) 06/17/2014   Acute CVA (cerebrovascular accident) (HCC) 05/22/2014   Non-compliance 05/22/2014   History of cardioembolic stroke 05/22/2014   Status post device closure of ASD 05/22/2014   HTN (hypertension), benign 05/22/2014   DM type 2 (diabetes  mellitus, type 2) (HCC) 05/22/2014   Past Medical History:  Diagnosis Date   Anemia    in the past   Asthma     a long time ago   Bursitis    Depression    13 years ago -    Diabetes mellitus without complication (HCC)    type  2   GERD (gastroesophageal reflux disease)    occ. acid reflux   H/O: hysterectomy    Headache    migraines   Hypertension    Kidney stones    PFO (patent foramen ovale)    hx of PFO with surgery done as an adult   Rotator cuff tear    right   Stroke [facility]) 2009   lasdt stroke July 2015   Syncope and collapse    Family History  Problem Relation Age of Onset   Hypertension Mother    Hypertension Father    Cancer Maternal Aunt    Breast cancer Maternal Aunt    Cancer Maternal Uncle    Past Surgical History:  Procedure Laterality Date   ABDOMINAL HYSTERECTOMY     BREAST REDUCTION SURGERY     CARDIAC CATHETERIZATION     CARDIAC SURGERY  Closed hole in heart in 2009   Amplatzer Septal Occluder Ref [id number] (not listed in op notes, document scanned under MRI with written op notes   HYSTEROTOMY     IR ANGIO INTRA EXTRACRAN SEL COM CAROTID INNOMINATE BILAT MOD SED  04/09/2018   IR ANGIO VERTEBRAL SEL SUBCLAVIAN INNOMINATE UNI R MOD SED  04/09/2018   IR GENERIC HISTORICAL  06/23/2016   IR ANGIO INTRA EXTRACRAN SEL COM CAROTID INNOMINATE BILAT MOD SED 06/23/2016 Thyra Nash, MD [facility]   IR GENERIC HISTORICAL  06/23/2016   IR ANGIO VERTEBRAL SEL VERTEBRAL BILAT MOD SED 06/23/2016 Thyra Nash, MD [facility]   IR GENERIC HISTORICAL  06/29/2016   IR RADIOLOGIST EVAL & MGMT 06/29/2016 [facility]   IR GENERIC HISTORICAL  07/24/2016   IR INTRA CRAN STENT 07/24/2016 Thyra Nash, MD [facility]   IR GENERIC HISTORICAL  07/28/2016   IR PTA INTRACRANIAL 07/28/2016 Thyra Nash, MD [facility]   IR GENERIC HISTORICAL  07/28/2016   IR ENDOVASC INTRACRANIAL INF OTHER THAN THROMBO ART INC DIAG ANGIO 07/28/2016 Thyra Nash,  MD [facility]   IR GENERIC HISTORICAL  08/07/2016   IR RADIOLOGIST EVAL & MGMT 08/07/2016 [facility]   IR GENERIC HISTORICAL  12/05/2016   IR ANGIO INTRA EXTRACRAN SEL COM CAROTID INNOMINATE BILAT MOD SED 12/05/2016 Thyra Nash, MD [facility]   IR GENERIC HISTORICAL  12/05/2016  IR ANGIO VERTEBRAL SEL SUBCLAVIAN INNOMINATE UNI R MOD SED 12/05/2016 Thyra Nash, MD [facility]   IR GENERIC HISTORICAL  12/05/2016   IR ANGIO VERTEBRAL SEL VERTEBRAL UNI L MOD SED 12/05/2016 Thyra Nash, MD [facility]   RADIOLOGY WITH ANESTHESIA [name] 07/24/2016   Procedure: Stenting;  Surgeon: Thyra Nash, MD;  Location: [facility];  Service: Radiology;  Laterality: [name];   RADIOLOGY WITH ANESTHESIA [name] 07/28/2016   Procedure: RADIOLOGY WITH ANESTHESIA;  Surgeon: Medication Radiologist, MD;  Location: [facility] OR;  Service: Radiology;  Laterality: [name];   REDUCTION MAMMAPLASTY Bilateral 2000   SHOULDER ARTHROSCOPY WITH OPEN ROTATOR CUFF REPAIR AND DISTAL CLAVICLE ACROMINECTOMY Right 02/04/2015   Procedure: SHOULDER ARTHROSCOPY WITH SUBACROMIAL DECOMPRESSION AND OPEN ROTATOR CUFF REPAIR,;  Surgeon: Glendia Cordella Hutchinson, MD;  Location: [facility] OR;  Service: Orthopedics;  Laterality: Right;  RIGHT SHOULDER DOA,SAD,MINI-OPEN ROTATOR CUFF TEAR REPAIR.   Social History   Occupational History   Occupation: [name]    Employer: [employer]  Tobacco Use   Smoking status: Never   Smokeless tobacco: Never  Vaping Use   Vaping status: Never Used  Substance and Sexual Activity   Alcohol use: Yes    Alcohol/week: 0.0 standard drinks of alcohol    Comment: occasional   Drug use: No   Sexual activity: Not Currently   "

## 2024-11-05 NOTE — Progress Notes (Signed)
 Pain Scale   Average Pain 7      Patient has had low back pain x 2 years. H/o 3 strokes, so does have a problem with falling. Pain is midline lower back, not radiating into legs or up/across back. Hurts mostly with standing, getting better with sitting down. However, she states she cannot sit too long before the pain returns. Helps to sit up straight, no slouching/slumping.

## 2024-11-05 NOTE — Progress Notes (Signed)
 Core Outcome Measures Index (COMI) Back Score  Average Pain 7  COMI Score 70%

## 2024-11-19 ENCOUNTER — Ambulatory Visit
Admission: RE | Admit: 2024-11-19 | Discharge: 2024-11-19 | Disposition: A | Payer: Medicare (Managed Care) | Source: Ambulatory Visit | Attending: Physical Medicine and Rehabilitation | Admitting: Physical Medicine and Rehabilitation

## 2024-11-19 DIAGNOSIS — G8929 Other chronic pain: Secondary | ICD-10-CM

## 2024-11-19 DIAGNOSIS — M47819 Spondylosis without myelopathy or radiculopathy, site unspecified: Secondary | ICD-10-CM

## 2024-11-19 DIAGNOSIS — R202 Paresthesia of skin: Secondary | ICD-10-CM

## 2024-11-20 ENCOUNTER — Ambulatory Visit: Payer: Medicare (Managed Care) | Admitting: Rehabilitative and Restorative Service Providers"

## 2024-11-25 ENCOUNTER — Ambulatory Visit: Payer: Self-pay

## 2024-11-26 ENCOUNTER — Ambulatory Visit: Payer: Medicare (Managed Care) | Admitting: Physical Therapy

## 2024-11-26 ENCOUNTER — Encounter: Payer: Self-pay | Admitting: Physical Therapy

## 2024-11-26 DIAGNOSIS — M5459 Other low back pain: Secondary | ICD-10-CM | POA: Diagnosis not present

## 2024-11-26 DIAGNOSIS — E118 Type 2 diabetes mellitus with unspecified complications: Secondary | ICD-10-CM

## 2024-11-26 DIAGNOSIS — I10 Essential (primary) hypertension: Secondary | ICD-10-CM

## 2024-11-26 DIAGNOSIS — R29898 Other symptoms and signs involving the musculoskeletal system: Secondary | ICD-10-CM | POA: Diagnosis not present

## 2024-11-26 DIAGNOSIS — Z8673 Personal history of transient ischemic attack (TIA), and cerebral infarction without residual deficits: Secondary | ICD-10-CM

## 2024-11-26 DIAGNOSIS — M6281 Muscle weakness (generalized): Secondary | ICD-10-CM | POA: Diagnosis not present

## 2024-11-26 NOTE — Therapy (Signed)
 " OUTPATIENT PHYSICAL THERAPY EVALUATION   Patient Name: Patricia [name] MRN: [id number] DOB:01/09/1966, 59 y.o., female Today's Date: 11/26/2024  END OF SESSION:  PT End of Session - 11/26/24 1014     Visit Number 1    Number of Visits 4    Date for Recertification  12/24/24    Authorization Type [employer] $15 copay    Progress Note Due on Visit 10    PT Start Time 1010    PT Stop Time 1045    PT Time Calculation (min) 35 min    Activity Tolerance Patient tolerated treatment well    Behavior During Therapy WFL for tasks assessed/performed          Past Medical History:  Diagnosis Date   Anemia    in the past   Asthma     a long time ago   Bursitis    Depression    13 years ago -    Diabetes mellitus without complication (HCC)    type  2   GERD (gastroesophageal reflux disease)    occ. acid reflux   H/O: hysterectomy    Headache    migraines   Hypertension    Kidney stones    PFO (patent foramen ovale)    hx of PFO with surgery done as an adult   Rotator cuff tear    right   Stroke [facility]) 2009   lasdt stroke July 2015   Syncope and collapse    Past Surgical History:  Procedure Laterality Date   ABDOMINAL HYSTERECTOMY     BREAST REDUCTION SURGERY     CARDIAC CATHETERIZATION     CARDIAC SURGERY  Closed hole in heart in 2009   Amplatzer Septal Occluder Ref [id number] (not listed in op notes, document scanned under MRI with written op notes   HYSTEROTOMY     IR ANGIO INTRA EXTRACRAN SEL COM CAROTID INNOMINATE BILAT MOD SED  04/09/2018   IR ANGIO VERTEBRAL SEL SUBCLAVIAN INNOMINATE UNI R MOD SED  04/09/2018   IR GENERIC HISTORICAL  06/23/2016   IR ANGIO INTRA EXTRACRAN SEL COM CAROTID INNOMINATE BILAT MOD SED 06/23/2016 Thyra Nash, MD [facility]   IR GENERIC HISTORICAL  06/23/2016   IR ANGIO VERTEBRAL SEL VERTEBRAL BILAT MOD SED 06/23/2016 Thyra Nash, MD [facility]   IR GENERIC HISTORICAL  06/29/2016   IR RADIOLOGIST EVAL & MGMT  06/29/2016 [facility]   IR GENERIC HISTORICAL  07/24/2016   IR INTRA CRAN STENT 07/24/2016 Thyra Nash, MD [facility]   IR GENERIC HISTORICAL  07/28/2016   IR PTA INTRACRANIAL 07/28/2016 Thyra Nash, MD [facility]   IR GENERIC HISTORICAL  07/28/2016   IR ENDOVASC INTRACRANIAL INF OTHER THAN THROMBO ART INC DIAG ANGIO 07/28/2016 Thyra Nash, MD [facility]   IR GENERIC HISTORICAL  08/07/2016   IR RADIOLOGIST EVAL & MGMT 08/07/2016 [facility]   IR GENERIC HISTORICAL  12/05/2016   IR ANGIO INTRA EXTRACRAN SEL COM CAROTID INNOMINATE BILAT MOD SED 12/05/2016 Thyra Nash, MD [facility]   IR GENERIC HISTORICAL  12/05/2016   IR ANGIO VERTEBRAL SEL SUBCLAVIAN INNOMINATE UNI R MOD SED 12/05/2016 Thyra Nash, MD [facility]   IR GENERIC HISTORICAL  12/05/2016   IR ANGIO VERTEBRAL SEL VERTEBRAL UNI L MOD SED 12/05/2016 Thyra Nash, MD [facility]   RADIOLOGY WITH ANESTHESIA [name] 07/24/2016   Procedure: Stenting;  Surgeon: Thyra Nash, MD;  Location: [facility];  Service: Radiology;  Laterality: [name];   RADIOLOGY WITH  ANESTHESIA [name] 07/28/2016   Procedure: RADIOLOGY WITH ANESTHESIA;  Surgeon: Medication Radiologist, MD;  Location: [facility];  Service: Radiology;  Laterality: [name];   REDUCTION MAMMAPLASTY Bilateral 2000   SHOULDER ARTHROSCOPY WITH OPEN ROTATOR CUFF REPAIR AND DISTAL CLAVICLE ACROMINECTOMY Right 02/04/2015   Procedure: SHOULDER ARTHROSCOPY WITH SUBACROMIAL DECOMPRESSION AND OPEN ROTATOR CUFF REPAIR,;  Surgeon: Glendia Cordella Hutchinson, MD;  Location: [facility];  Service: Orthopedics;  Laterality: Right;  RIGHT SHOULDER DOA,SAD,MINI-OPEN ROTATOR CUFF TEAR REPAIR.   Patient Active Problem List   Diagnosis Date Noted   Class 2 severe obesity due to excess calories with serious comorbidity and body mass index (BMI) of 37.0 to 37.9 in adult 09/24/2024   Vitamin D  deficiency 05/28/2024   Lumbar pain 05/28/2024   Establishing care with new doctor, encounter for  05/28/2024   Immunization due 05/28/2024   Encounter for hepatitis C screening test for low risk patient 05/28/2024   Nausea 05/28/2024   Benign hypertension with CKD (chronic kidney disease) stage IV (HCC) 05/28/2024   History of total hysterectomy 05/23/2024   Hyperlipidemia associated with type 2 diabetes mellitus (HCC) 05/23/2024   Cervicalgia 07/11/2023   Facet arthropathy, cervical 05/09/2023   Intractable migraine without aura and without status migrainosus 05/17/2022   Renal insufficiency 03/10/2021   Chronic daily headache 06/19/2017   Intractable chronic migraine without aura and with status migrainosus 09/15/2016   Depression 08/23/2016   Cerebrovascular accident (CVA) due to occlusion of left middle cerebral artery (HCC) 07/28/2016   Acute respiratory failure (HCC)    Middle cerebral artery stenosis, left 07/24/2016   Headache 03/23/2016   Type 2 diabetes mellitus with complication, with long-term current use of insulin  (HCC) 09/08/2015   Hyperglycemia    TIA (transient ischemic attack)    AKI (acute kidney injury) 02/20/2015   ARF (acute renal failure) 02/20/2015   Rotator cuff tear 02/04/2015   Occipital neuralgia 08/27/2014   Type 2 diabetes mellitus with complication (HCC) 08/27/2014   PFO (patent foramen ovale) 06/17/2014   Hyperlipidemia 06/17/2014   Morbid obesity, unspecified obesity type (HCC) 06/17/2014   Acute CVA (cerebrovascular accident) (HCC) 05/22/2014   Non-compliance 05/22/2014   History of cardioembolic stroke 05/22/2014   Status post device closure of ASD 05/22/2014   HTN (hypertension), benign 05/22/2014   DM type 2 (diabetes mellitus, type 2) (HCC) 05/22/2014    PCP: [name], NP  REFERRING PROVIDER: Trudy Duwaine BRAVO, NP  REFERRING DIAG: M54.50,G89.29 (ICD-10-CM) - Chronic bilateral low back pain without sciatica M47.819 (ICD-10-CM) - Spondylosis without myelopathy or radiculopathy R20.2 (ICD-10-CM) - Paresthesia of skin  Rationale for  Evaluation and Treatment: Rehabilitation  THERAPY DIAG:  Other low back pain - Plan: PT plan of care cert/re-cert  Muscle weakness (generalized) - Plan: PT plan of care cert/re-cert  Other symptoms and signs involving the musculoskeletal system - Plan: PT plan of care cert/re-cert  ONSET DATE: 2 years ago; Date of referral: 11/05/24   SUBJECTIVE:  SUBJECTIVE STATEMENT: OKLA QAZI is a 59 y.o. female who presents to OPPT for evaluation of chronic, worsening and severe bilateral lower back pain.  She states pain started about 2 years ago, and was treated with narcotics which were not helpful.  She denies any known injury.  Does have a history of falls due to multiple CVAs and that may correlate with pain.  Intermittent tingling sensation to right anterior thigh.    PERTINENT HISTORY:  HTN, CKD (stage IV), HLD, DM, CVA x 3, PFO, depression, OA, obesity  PAIN:  Are you having pain? Yes: NPRS scale: 5 currently, up to 7, at best 4/10 Pain location: low back, Rt side worse than Lt; radiates to Rt ant thigh Pain description: stabbing Aggravating factors: standing more than a few min, lying down Relieving factors: needs to constantly reposition  PRECAUTIONS:  Fall  RED FLAGS: None   WEIGHT BEARING RESTRICTIONS:  No  FALLS:  Has patient fallen in last 6 months? Yes. Number of falls 3 in the past 2 weeks; reports she was nauseated and had not been eating much and just got weak. No falls outside of recent medical issue.  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  DIAGNOSTIC FINDINGS:  X-rays: multi level facet arthropathy, most prominent at L3-L4 and L4-L5. There is mild anterolisthesis of L3 on L4 MRI: 1. Right foraminal to extraforaminal disc protrusion at L3-4, contacting and potentially the exiting right L3 nerve root. 2. Small right foraminal disc protrusion at L5-S1, closely approximating and potentially irritating the exiting right L5 nerve root. 3. Mild to moderate bilateral L1, L3, L4, and L5 foraminal stenosis related to disc bulge and facet hypertrophy as above. 4. Moderate lower lumbar facet hypertrophy, most pronounced at L5-S1 on the right. Findings could contribute to lower back pain  PATIENT SURVEYS:  Patient-Specific Activity Scoring Scheme  0 represents unable to perform. 10 represents able to perform at prior level. 0 1 2 3 4 5 6 7 8 9  10 (Date and Score)   Activity Eval     1. Standing in one spot for long periods 0    2. Sitting for long periods 1    3. Walking for long periods 0   4. Lying on back 0   Score 0.25    Total score = sum of the activity scores/number of activities Minimum detectable change (90%CI) for average score = 2 points Minimum detectable change (90%CI) for single activity score = 3 points    COGNITIVE STATUS: Within functional limits for tasks assessed   SENSATION: WFL  POSTURE:  rounded shoulders and forward head  GAIT: 11/26/24 Comments: mild antalgic gait noted; no overt instability noted   LUMBAR ROM:   ROM AROM  eval  Flexion Unable due to recent eye surgery  Extension WNL  Right lateral flexion Limited 50% Rt side pain  Left lateral flexion Limited 50% Rt side pain   (Blank rows = not tested)   LOWER EXTREMITY MMT:    MMT Right eval Left eval  Hip flexion 3+/5 4/5  Hip extension    Hip abduction    Hip adduction    Hip internal rotation    Hip external rotation  Knee flexion 4/5 5/5  Knee  extension 4/5 5/5  Ankle dorsiflexion    Ankle plantarflexion    Ankle inversion    Ankle eversion     (Blank rows = not tested)    FUNCTIONAL TESTS:  11/26/24 5 times sit to stand: 15.75 sec   TREATMENT:                                                                                                                              DATE:  11/26/24 TherEx See HEP - demonstrated with trial reps performed PRN, min cues for comprehension    Self Care Educated on clinical findings; pt also expressing food and housing insecurities at this time which appears to be a larger concern than her back pain.  Provided community resources and will make referral to [facility].      PATIENT EDUCATION:  Education details: HEP, community resources Person educated: Patient Education method: Explanation, Facilities Manager, and Handouts Education comprehension: verbalized understanding, returned demonstration, and needs further education  HOME EXERCISE PROGRAM: Access Code: [email or web address] URL: [email or web address] Date: 11/26/2024 Prepared by: Corean Ku  Program Notes Community Resource Directory by findhelp - Search and Connect to Social Care  Exercises - Standing Lumbar Extension  - 1 x daily - 7 x weekly - 1-2 sets - 10 reps - Standing Sidebends  - 1 x daily - 7 x weekly - 1-2 sets - 10 reps - Prone Press Up  - 1 x daily - 7 x weekly - 1 sets - 10 reps - 1-2 sec hold - Hooklying Single Knee to Chest  - 1 x daily - 7 x weekly - 1 sets - 3 reps - 30 sec hold   ASSESSMENT:  CLINICAL IMPRESSION: Patient is a 59  y.o. female who was seen today for physical therapy evaluation and treatment for chronic LBP. She demonstrates continue pain, postural abnormalities and decreased strength affecting functional mobility.  At this time financial strain on food and housing appears to be of more concern, and therefore referrals and resources provided for her today, and pt will call if she feels she  can return to PT.  She will benefit from PT to address deficits listed.     OBJECTIVE IMPAIRMENTS: Abnormal gait, decreased activity tolerance, decreased balance, decreased ROM, decreased strength, impaired flexibility, postural dysfunction, and pain.   ACTIVITY LIMITATIONS: carrying, lifting, bending, sitting, standing, sleeping, transfers, bed mobility, bathing, toileting, dressing, hygiene/grooming, and locomotion level  PARTICIPATION LIMITATIONS: meal prep, cleaning, laundry, community activity, and occupation  PERSONAL FACTORS: Age, Past/current experiences, Time since onset of injury/illness/exacerbation, and 3+ comorbidities: HTN, CKD (stage IV), HLD, DM, CVA x 3, PFO, depression, OA, obesity are also affecting patient's functional outcome.   REHAB POTENTIAL: Fair chronic pain with multiple co morbidities; financial strain impacting ability to attend PT  CLINICAL DECISION MAKING: Unstable/unpredictable  EVALUATION COMPLEXITY: High   GOALS: Goals reviewed with patient? Yes  LONG TERM GOALS: Target date: 12/24/2024  Independent with final HEP Goal status: INITIAL  2.  PSFS score improved by 2 points Goal status: INITIAL  3.  Lumbar improved to WNL without pain for improved mobility Goal status: INIITAL  4.  Report pain < 5/10 with standing and walking for improved function Goal status: INITIAL  5.  Report 50% improvement in bed mobility and sleep for improved function Goal status: INITIAL  PLAN:  PT FREQUENCY: 1x/week (PRN; will see up to 1x/wk)  PT DURATION: 4 weeks  PLANNED INTERVENTIONS: 97164- PT Re-evaluation, 97750- Physical Performance Testing, 97110-Therapeutic exercises, 97530- Therapeutic activity, 97112- Neuromuscular re-education, 97535- Self Care, [id number]- Manual therapy, [id number]- Gait training, [phone]- Aquatic Therapy, [phone]- Electrical stimulation (unattended), [phone] (1-2 muscles), [id number] (3+ muscles)- Dry Needling, Patient/Family education, Balance training,  Stair training, Taping, Spinal manipulation, Spinal mobilization, Cryotherapy, and Moist heat.  PLAN FOR NEXT SESSION: if pt returns; review HEP, progress strengthening   NEXT MD VISIT: 12/03/24   Corean JULIANNA Ku, PT, DPT 11/26/24 11:26 AM   "

## 2024-11-27 ENCOUNTER — Telehealth: Payer: Self-pay

## 2024-11-27 NOTE — Progress Notes (Signed)
 Complex Care Management Note  Care Guide Note 11/27/2024 Name: [name] MRN: [id number] DOB: [birth year]  Patricia [name] Conaty is a 59 y.o. year old female who sees [name], NP for primary care. I reached out to Patricia [name] Bolognese by phone today to offer complex care management services.  Ms. Duffey was given information about Complex Care Management services today including:   The Complex Care Management services include support from the care team which includes your Nurse Care Manager, Clinical Social Worker, or Pharmacist.  The Complex Care Management team is here to help remove barriers to the health concerns and goals most important to you. Complex Care Management services are voluntary, and the patient may decline or stop services at any time by request to their care team member.   Complex Care Management Consent Status: Patient agreed to services and verbal consent obtained.   Follow up plan:  Telephone appointment with complex care management team member scheduled for:  12/08/24 and 12/15/24  Encounter Outcome:  Patient Scheduled  Debbe Fuse [facility], [facility] Guide  Direct Dial: [phone]  Fax [phone]

## 2024-11-27 NOTE — Addendum Note (Signed)
 Addended by: ALVIA COREAN FALCON on: 11/27/2024 09:08 AM   Modules accepted: Orders

## 2024-12-03 ENCOUNTER — Ambulatory Visit: Payer: Medicare (Managed Care) | Admitting: Physical Medicine and Rehabilitation

## 2024-12-08 ENCOUNTER — Telehealth: Payer: Medicare (Managed Care)

## 2024-12-15 ENCOUNTER — Telehealth: Payer: Medicare (Managed Care)

## 2025-01-28 ENCOUNTER — Encounter: Payer: Medicare (Managed Care) | Admitting: Family Medicine

## 2025-03-18 ENCOUNTER — Ambulatory Visit: Payer: Self-pay | Admitting: Nurse Practitioner
